# Patient Record
Sex: Male | Born: 1945
Health system: Southern US, Community
[De-identification: ages and names within clinical notes are randomized; demographics above are authoritative.]

## PROBLEM LIST (undated history)

## (undated) DIAGNOSIS — E079 Disorder of thyroid, unspecified: Secondary | ICD-10-CM

## (undated) DIAGNOSIS — I7300 Raynaud's syndrome without gangrene: Secondary | ICD-10-CM

## (undated) DIAGNOSIS — G20 Parkinson's disease: Secondary | ICD-10-CM

## (undated) DIAGNOSIS — F32A Depression, unspecified: Secondary | ICD-10-CM

## (undated) DIAGNOSIS — D649 Anemia, unspecified: Secondary | ICD-10-CM

## (undated) DIAGNOSIS — F329 Major depressive disorder, single episode, unspecified: Secondary | ICD-10-CM

## (undated) DIAGNOSIS — T7840XA Allergy, unspecified, initial encounter: Secondary | ICD-10-CM

## (undated) HISTORY — DX: Major depressive disorder, single episode, unspecified: F32.9

## (undated) HISTORY — PX: ROTATOR CUFF REPAIR: SHX139

## (undated) HISTORY — DX: Depression, unspecified: F32.A

## (undated) HISTORY — PX: TONSILLECTOMY: SUR1361

## (undated) HISTORY — DX: Raynaud's syndrome without gangrene: I73.00

## (undated) HISTORY — PX: HERNIA REPAIR: SHX51

## (undated) HISTORY — PX: VASECTOMY: SHX75

## (undated) HISTORY — DX: Anemia, unspecified: D64.9

## (undated) HISTORY — DX: Disorder of thyroid, unspecified: E07.9

## (undated) HISTORY — PX: APPENDECTOMY: SHX54

## (undated) HISTORY — PX: WRIST FRACTURE SURGERY: SHX121

## (undated) HISTORY — PX: FRACTURE SURGERY: SHX138

## (undated) HISTORY — DX: Allergy, unspecified, initial encounter: T78.40XA

## (undated) HISTORY — PX: MANDIBLE FRACTURE SURGERY: SHX706

## (undated) HISTORY — DX: Parkinson's disease: G20

---

## 1998-08-20 ENCOUNTER — Ambulatory Visit (HOSPITAL_COMMUNITY): Admission: RE | Admit: 1998-08-20 | Discharge: 1998-08-20 | Payer: Self-pay | Admitting: Gastroenterology

## 2000-01-02 ENCOUNTER — Inpatient Hospital Stay (HOSPITAL_COMMUNITY): Admission: EM | Admit: 2000-01-02 | Discharge: 2000-01-04 | Payer: Self-pay | Admitting: Emergency Medicine

## 2000-01-02 ENCOUNTER — Encounter: Payer: Self-pay | Admitting: Emergency Medicine

## 2000-01-03 ENCOUNTER — Encounter: Payer: Self-pay | Admitting: *Deleted

## 2000-10-18 ENCOUNTER — Emergency Department (HOSPITAL_COMMUNITY): Admission: EM | Admit: 2000-10-18 | Discharge: 2000-10-18 | Payer: Self-pay | Admitting: Emergency Medicine

## 2000-10-18 ENCOUNTER — Encounter: Payer: Self-pay | Admitting: Emergency Medicine

## 2001-07-04 ENCOUNTER — Encounter (HOSPITAL_COMMUNITY): Payer: Self-pay | Admitting: Oncology

## 2001-07-04 ENCOUNTER — Ambulatory Visit (HOSPITAL_COMMUNITY): Admission: RE | Admit: 2001-07-04 | Discharge: 2001-07-04 | Payer: Self-pay | Admitting: Specialist

## 2001-09-23 ENCOUNTER — Ambulatory Visit (HOSPITAL_COMMUNITY): Admission: RE | Admit: 2001-09-23 | Discharge: 2001-09-23 | Payer: Self-pay | Admitting: Specialist

## 2001-09-27 ENCOUNTER — Inpatient Hospital Stay (HOSPITAL_COMMUNITY): Admission: RE | Admit: 2001-09-27 | Discharge: 2001-09-28 | Payer: Self-pay | Admitting: Specialist

## 2002-01-09 ENCOUNTER — Ambulatory Visit (HOSPITAL_COMMUNITY): Admission: RE | Admit: 2002-01-09 | Discharge: 2002-01-09 | Payer: Self-pay | Admitting: Specialist

## 2002-04-20 ENCOUNTER — Emergency Department (HOSPITAL_COMMUNITY): Admission: EM | Admit: 2002-04-20 | Discharge: 2002-04-21 | Payer: Self-pay | Admitting: Emergency Medicine

## 2002-04-22 ENCOUNTER — Emergency Department (HOSPITAL_COMMUNITY): Admission: EM | Admit: 2002-04-22 | Discharge: 2002-04-22 | Payer: Self-pay | Admitting: *Deleted

## 2002-04-25 ENCOUNTER — Emergency Department (HOSPITAL_COMMUNITY): Admission: EM | Admit: 2002-04-25 | Discharge: 2002-04-25 | Payer: Self-pay | Admitting: Emergency Medicine

## 2002-04-27 ENCOUNTER — Emergency Department (HOSPITAL_COMMUNITY): Admission: EM | Admit: 2002-04-27 | Discharge: 2002-04-27 | Payer: Self-pay | Admitting: Emergency Medicine

## 2002-04-29 ENCOUNTER — Emergency Department (HOSPITAL_COMMUNITY): Admission: EM | Admit: 2002-04-29 | Discharge: 2002-04-29 | Payer: Self-pay | Admitting: Emergency Medicine

## 2004-01-25 ENCOUNTER — Ambulatory Visit (HOSPITAL_COMMUNITY): Admission: RE | Admit: 2004-01-25 | Discharge: 2004-01-25 | Payer: Self-pay | Admitting: Gastroenterology

## 2004-01-25 ENCOUNTER — Emergency Department (HOSPITAL_COMMUNITY): Admission: EM | Admit: 2004-01-25 | Discharge: 2004-01-25 | Payer: Self-pay | Admitting: Emergency Medicine

## 2006-04-05 ENCOUNTER — Emergency Department (HOSPITAL_COMMUNITY): Admission: EM | Admit: 2006-04-05 | Discharge: 2006-04-05 | Payer: Self-pay | Admitting: Emergency Medicine

## 2007-09-08 ENCOUNTER — Encounter: Admission: RE | Admit: 2007-09-08 | Discharge: 2007-09-08 | Payer: Self-pay | Admitting: Internal Medicine

## 2007-09-30 ENCOUNTER — Encounter: Admission: RE | Admit: 2007-09-30 | Discharge: 2007-12-29 | Payer: Self-pay | Admitting: Psychology

## 2008-06-29 ENCOUNTER — Ambulatory Visit: Payer: Self-pay | Admitting: Psychology

## 2008-07-21 ENCOUNTER — Ambulatory Visit: Payer: Self-pay | Admitting: Psychology

## 2008-08-07 ENCOUNTER — Ambulatory Visit: Payer: Self-pay | Admitting: Psychology

## 2008-08-11 ENCOUNTER — Encounter: Admission: RE | Admit: 2008-08-11 | Discharge: 2008-08-11 | Payer: Self-pay | Admitting: General Surgery

## 2008-08-13 ENCOUNTER — Encounter (INDEPENDENT_AMBULATORY_CARE_PROVIDER_SITE_OTHER): Payer: Self-pay | Admitting: General Surgery

## 2008-08-13 ENCOUNTER — Ambulatory Visit (HOSPITAL_BASED_OUTPATIENT_CLINIC_OR_DEPARTMENT_OTHER): Admission: RE | Admit: 2008-08-13 | Discharge: 2008-08-13 | Payer: Self-pay | Admitting: General Surgery

## 2008-08-21 ENCOUNTER — Ambulatory Visit: Payer: Self-pay | Admitting: Psychology

## 2008-09-16 ENCOUNTER — Ambulatory Visit: Payer: Self-pay | Admitting: Psychology

## 2008-10-02 ENCOUNTER — Ambulatory Visit: Payer: Self-pay | Admitting: Psychology

## 2008-10-16 ENCOUNTER — Ambulatory Visit: Payer: Self-pay | Admitting: Psychology

## 2008-11-04 ENCOUNTER — Ambulatory Visit: Payer: Self-pay | Admitting: Psychology

## 2008-11-19 ENCOUNTER — Ambulatory Visit: Payer: Self-pay | Admitting: Psychology

## 2008-12-18 ENCOUNTER — Ambulatory Visit: Payer: Self-pay | Admitting: Psychology

## 2009-01-18 ENCOUNTER — Ambulatory Visit: Payer: Self-pay | Admitting: Psychology

## 2009-02-15 ENCOUNTER — Ambulatory Visit: Payer: Self-pay | Admitting: Psychology

## 2009-03-25 ENCOUNTER — Ambulatory Visit: Payer: Self-pay | Admitting: Psychology

## 2010-02-03 ENCOUNTER — Ambulatory Visit
Admission: RE | Admit: 2010-02-03 | Discharge: 2010-02-03 | Payer: Self-pay | Source: Home / Self Care | Attending: Orthopedic Surgery | Admitting: Orthopedic Surgery

## 2010-05-02 LAB — POCT HEMOGLOBIN-HEMACUE: Hemoglobin: 13.6 g/dL (ref 13.0–17.0)

## 2010-05-30 LAB — POCT HEMOGLOBIN-HEMACUE: Hemoglobin: 12.5 g/dL — ABNORMAL LOW (ref 13.0–17.0)

## 2010-07-05 NOTE — Op Note (Signed)
[name], [name]               ACCOUNT NO.:  [id number]   MEDICAL RECORD NO.:  [id number]          PATIENT TYPE:  AMB   LOCATION:  DSC                          FACILITY:  [facility]   PHYSICIAN:  Juanetta Gosling, MDDATE OF BIRTH:  1945/08/14   DATE OF PROCEDURE:  08/13/2008  DATE OF DISCHARGE:                               OPERATIVE REPORT   PREOPERATIVE DIAGNOSIS:  Right inguinal hernia.   POSTOPERATIVE DIAGNOSIS:  [place] right inguinal hernia.   PROCEDURE:  1. Right inguinal repair with extended Prolene hernia system mesh.  2. Right groin lymph node biopsy.   SURGEON:  Juanetta Gosling, MD   ASSISTANT:  None.   ANESTHESIA:  General.   SPECIMENS:  1. Cord lipoma.  2. Lymph node.   DISPOSITION:  To pathology.   ESTIMATED BLOOD LOSS:  Minimal.   COMPLICATIONS:  None.   DRAINS:  None.   DISPOSITION:  The patient to PACU in stable condition.   INDICATIONS:  [name] is a 65 year old male with a couple of year  history of increase in size right groin mass, become increasingly tender  over that time.  He was also recently diagnosed with Parkinson disease  for which he is being treated.  He does have a prior history of  colonoscopy by Dr. Randa Evens and now followed by Dr. Bernette Redbird.  On  his exam, he had a reducible right groin hernia.  He and I discussed  open right groin hernia repair.   PROCEDURE:  After informed consent was obtained, the patient was taken  to the operating room.  He was administered 400 mg of IV ciprofloxacin.  Sequential compression devices were placed on the lower extremities  prior to operation.  He then underwent general anesthesia with an LMA  without complication.  His right groin was then prepped and draped in a  standard sterile surgical fashion.  A surgical time-out was then  performed.   A 4-cm right groin incision was then made.  Dissection was carried out  down to the level of the fascia.  The superficial epigastric vein  was  ligated with 3-0 Vicryl suture.  The external oblique was entered  through the external ring.  He was noted to have an indirect hernia as  well as a cord lipoma and a  direct hernia.  The cord lipoma was  excised.  The indirect hernia was freed from the surrounding cord  structures which were preserved.  This was then entered with scissors.  The sac was then probed.  I then ligated the sac with a 2-0 silk suture  and then dunked this back into the abdomen and this was passed off the  table as a specimen.  Following this, I then approached the direct  hernia.  He had about a 5-mm defect with a fair amount of preperitoneal  fat incarcerated in this.  This was reduced and I repaired this  primarily with a 2-0 Vicryl suture to close the floor.  Following this,  I then developed my preperitoneal space with a Ray-Tec sponge.  I then  inserted an extended  Prolene hernia system mesh into the preperitoneal  space and laid this flat.  The top portion of the bilayer was then  brought out.  A T-cut was made around the mesh and wrapped around the  spermatic cord.  This was sutured in 2 positions at the pubic tubercle,  2 positions inferiorly at the inguinal ligament, and 1 position  superiorly at the internal oblique with 2-0 Prolene sutures.  The ends  were tacked together.  The ends of the T-cut were then tacked together  around the spermatic cord as well as sutured to the inguinal ligament  with 2-0 Prolene.  The lateral portion was laid under the external  abdominal oblique.  Hemostasis was observed.  The mesh appeared to lie  in good position.  The Penrose drain was removed from around the  spermatic cord.  The external oblique was closed with a 2-0 Vicryl.  The  Scarpa's was closed with 3-0 Vicryl.  The skin was closed with 4-0  Monocryl in a subcuticular fashion.  Dermabond was placed over the  wound.  A 10 mL of 1% lidocaine mixed with 0.2% Marcaine were then  infiltrated the wound as  well as performed an ilioinguinal nerve block.  Right testicle was in the scrotum at the completion of the operation.  He tolerated this well and was transferred to the recovery room in  stable condition.      Juanetta Gosling, MD  Electronically Signed     MCW/MEDQ  D:  08/13/2008  T:  08/13/2008  Job:  [id number]   cc:   Jonita Albee, M.D.

## 2010-07-08 NOTE — H&P (Signed)
NAMESAYED, APOSTOL                         ACCOUNT NO.:  [id number]   MEDICAL RECORD NO.:  [id number]                   PATIENT TYPE:  AMB   LOCATION:  DAY                                  FACILITY:  [facility]   PHYSICIAN:  Philips J. Montez Morita, M.D.             DATE OF BIRTH:  [birth year]   DATE OF ADMISSION:  09/23/2001  DATE OF DISCHARGE:                                HISTORY & PHYSICAL   CHIEF COMPLAINT:  Right shoulder pain.   HISTORY OF PRESENT ILLNESS:  The patient is a pleasant 65 year old male,  which in February of 2003 feel on the ice, landed on his shoulder. Since  that time, he has had progressive right shoulder pain that is interfering  with his activities of daily living as well as his job. He had an MRI, which  revealed a rotator cuff tear. It was felt he would benefit from undergoing a  right rotator cuff repair and the risks and benefits of the procedure  discussed with the patient and he elected to proceed.   ALLERGIES:  PENICILLIN causes rash.   MEDICATIONS:  Tylenol p.r.n.   PAST MEDICAL HISTORY:  Fracture of a mandible. He reports that in the  distant past he had a history of anemia. He states that he did undergo a  thorough work-up and the cause never really could be found. No problems that  he reports recently.   SOCIAL HISTORY:  Right-hand dominant. Denies any alcohol or tobacco use. He  is married. He works in Production designer, theatre/television/film at [employer]. He has two  children. He lives in a one-level home. His wife will be available for help  with him after surgery.   PAST SURGICAL HISTORY:  OIF mandible, chin implant, tonsillectomy.   FAMILY HISTORY:  Father deceased. age 7, history of colon cancer. Mother  living, age 35, history of cervical cancer.   REVIEW OF SYMPTOMS:  GENERAL: He had a recent upper respiratory infection  about two weeks ago, reports he has significantly improved since that time.  PULMONARY: No shortness of breath, productive cough or  hemoptysis.  CARDIOVASCULAR: No chest pain, angina, orthopnea. GENITOURINARY: No  hematuria, dysuria, or discharge. GASTROINTESTINAL: No nausea, vomiting,  diarrhea, or constipation. PSYCHIATRIC: No history of anxiety or depression.  MUSCULOSKELETAL: No history of any other joint pain.   PHYSICAL EXAMINATION:   GENERAL:  Alert and oriented, well-developed, well-nourished 65 year old  male.   VITAL:  SIGNS: Pulse 60, respirations 12, blood pressure 120/90.   HEENT:  Head is atraumatic, normocephalic. Oropharynx is clear.   NECK:  Supple, negative for cervical lymphadenopathy.   CHEST:  Clear to auscultation bilaterally. No wheezes, rhonchi or rubs.   BREAST:  Not pertinent to present illness.   HEART:  S1, S2, negative for murmur or gallop. Heart regular rate and  rhythm.   ABDOMEN:  Soft, nontender, positive bowel sounds.   GENITOURINARY:  Not pertinent to  present illness.   EXTREMITIES:  He has pain with abduction, decreased range of motion, but  limited in adduction.   SKIN:  Intact. No rashes or lesions appreciated on examination.   LABORATORY AND ACCESSORY DATA:  Labs and x-rays are pending.   IMPRESSION:  Right rotator cuff tear.   PLAN:  Right rotator cuff repair by Dr. Peter Minium on September 23, 2001.      Bradd Canary Clabe Seal.                       Philips J. Montez Morita, M.D.    JBB/MEDQ  D:  09/17/2001  T:  09/18/2001  Job:  [id number]

## 2010-07-14 ENCOUNTER — Other Ambulatory Visit: Payer: Self-pay | Admitting: Dermatology

## 2010-08-10 ENCOUNTER — Other Ambulatory Visit: Payer: Self-pay | Admitting: Dermatology

## 2011-02-06 ENCOUNTER — Ambulatory Visit (INDEPENDENT_AMBULATORY_CARE_PROVIDER_SITE_OTHER): Payer: Medicare Other | Admitting: Internal Medicine

## 2011-02-06 DIAGNOSIS — G20 Parkinson's disease: Secondary | ICD-10-CM

## 2011-02-06 DIAGNOSIS — M25569 Pain in unspecified knee: Secondary | ICD-10-CM

## 2011-02-06 DIAGNOSIS — Z23 Encounter for immunization: Secondary | ICD-10-CM

## 2011-02-06 DIAGNOSIS — F329 Major depressive disorder, single episode, unspecified: Secondary | ICD-10-CM

## 2011-02-06 DIAGNOSIS — E039 Hypothyroidism, unspecified: Secondary | ICD-10-CM

## 2011-03-14 ENCOUNTER — Ambulatory Visit (INDEPENDENT_AMBULATORY_CARE_PROVIDER_SITE_OTHER): Payer: Medicare Other

## 2011-03-14 DIAGNOSIS — F341 Dysthymic disorder: Secondary | ICD-10-CM | POA: Diagnosis not present

## 2011-03-14 DIAGNOSIS — G4733 Obstructive sleep apnea (adult) (pediatric): Secondary | ICD-10-CM

## 2011-03-14 DIAGNOSIS — G4700 Insomnia, unspecified: Secondary | ICD-10-CM | POA: Diagnosis not present

## 2011-03-21 DIAGNOSIS — E291 Testicular hypofunction: Secondary | ICD-10-CM | POA: Diagnosis not present

## 2011-03-21 DIAGNOSIS — N529 Male erectile dysfunction, unspecified: Secondary | ICD-10-CM | POA: Diagnosis not present

## 2011-03-23 ENCOUNTER — Telehealth: Payer: Self-pay

## 2011-03-23 DIAGNOSIS — D239 Other benign neoplasm of skin, unspecified: Secondary | ICD-10-CM | POA: Diagnosis not present

## 2011-03-23 DIAGNOSIS — F329 Major depressive disorder, single episode, unspecified: Secondary | ICD-10-CM

## 2011-03-23 NOTE — Telephone Encounter (Signed)
Patient E-mailed and would like a call back.  He directed his message to Dr. Patsy Lager. He went to Dr. Vernie Ammons on 03/21/11 and was told that his testosterone was normal.  He wants to know what the next step should be.  He would like a call back.

## 2011-03-23 NOTE — Telephone Encounter (Signed)
Chart is in dr. box in back

## 2011-03-24 NOTE — Telephone Encounter (Signed)
Called and LMOM.  We need to discuss a medication change- will probably want to D/C his azilect (which is an MAO-I) so we can start an SSRI.  I am out of office tomorrow but will call on Sunday JC

## 2011-03-27 NOTE — Telephone Encounter (Signed)
Spoke again with patient yesterday.  He has seen urology and they do not think that T supplementation will be helpful.  He would like to start and antidepressant of some sort- this is made more complicated by his Azilect.  He would like to continue this medication if possible because when he has stopped it in the past his Parkinson's symptoms got worse.  I will go ahead and refer him to psychiatry.

## 2011-04-02 ENCOUNTER — Ambulatory Visit (INDEPENDENT_AMBULATORY_CARE_PROVIDER_SITE_OTHER): Payer: Medicare Other | Admitting: Emergency Medicine

## 2011-04-02 VITALS — BP 145/75 | HR 66 | Temp 97.9°F | Resp 16 | Ht 67.5 in | Wt 135.0 lb

## 2011-04-02 DIAGNOSIS — F329 Major depressive disorder, single episode, unspecified: Secondary | ICD-10-CM

## 2011-04-02 DIAGNOSIS — F3341 Major depressive disorder, recurrent, in partial remission: Secondary | ICD-10-CM | POA: Insufficient documentation

## 2011-04-02 DIAGNOSIS — G20 Parkinson's disease: Secondary | ICD-10-CM

## 2011-04-02 DIAGNOSIS — R05 Cough: Secondary | ICD-10-CM | POA: Diagnosis not present

## 2011-04-02 DIAGNOSIS — J329 Chronic sinusitis, unspecified: Secondary | ICD-10-CM | POA: Diagnosis not present

## 2011-04-02 MED ORDER — HYDROCOD POLST-CHLORPHEN POLST 10-8 MG/5ML PO LQCR: 5.0000 mL | Freq: Two times a day (BID) | ORAL | Status: DC | PRN

## 2011-04-02 MED ORDER — AZITHROMYCIN 250 MG PO TABS: ORAL_TABLET | ORAL | Status: AC

## 2011-04-02 MED ORDER — OLOPATADINE HCL 0.6 % NA SOLN: 2.0000 | Freq: Two times a day (BID) | NASAL | Status: DC

## 2011-04-02 NOTE — Patient Instructions (Addendum)
Patient was given the name and number of Dr. Betti Cruz we can go ahead make his own appointment for psychiatric care. He has been significantly depressed recently and needs some assistance with this. Advised him he needs to make his own appointment as this is the policy of the psychiatrist.          Sinusitis Sinuses are air pockets within the bones of your face. The growth of bacteria within a sinus leads to infection. The infection prevents the sinuses from draining. This infection is called sinusitis. SYMPTOMS   There will be different areas of pain depending on which sinuses have become infected.  The maxillary sinuses often produce pain beneath the eyes.     Frontal sinusitis may cause pain in the middle of the forehead and above the eyes.  Other problems (symptoms) include:  Toothaches.     Colored, pus-like (purulent) drainage from the nose.     Swelling, warmth, and tenderness over the sinus areas may be signs of infection.  TREATMENT   Sinusitis is most often determined by an exam.X-rays may be taken. If x-rays have been taken, make sure you obtain your results or find out how you are to obtain them. Your caregiver may give you medications (antibiotics). These are medications that will help kill the bacteria causing the infection. You may also be given a medication (decongestant) that helps to reduce sinus swelling.   HOME CARE INSTRUCTIONS    Only take over-the-counter or prescription medicines for pain, discomfort, or fever as directed by your caregiver.     Drink extra fluids. Fluids help thin the mucus so your sinuses can drain more easily.     Applying either moist heat or ice packs to the sinus areas may help relieve discomfort.     Use saline nasal sprays to help moisten your sinuses. The sprays can be found at your local drugstore.  SEEK IMMEDIATE MEDICAL CARE IF:  You have a fever.     You have increasing pain, severe headaches, or toothache.     You have  nausea, vomiting, or drowsiness.     You develop unusual swelling around the face or trouble seeing.  MAKE SURE YOU:    Understand these instructions.     Will watch your condition.     Will get help right away if you are not doing well or get worse.  Document Released: 02/06/2005 Document Revised: 10/19/2010 Document Reviewed: 09/05/2006 [facility] Patient Information 2012 [place], [place].

## 2011-04-02 NOTE — Progress Notes (Signed)
  Subjective:    Patient ID: [name], male    DOB: 23-Nov-1945, 66 y.o.   MRN: [id number]  HPI patient presents with onset Thursday of upper respiratory type congestion. His developed significant bilateral maxillary sinus pain. Does have frequent episodes of sneezing with a productive cough. He is concerned about the infection heading to his chest.    Review of Systems patient has severe parkinsonism. He is on multiple medications for this problem.     Objective:   Physical Exam physical exam reveals typical bases are parkinsonism. His TMs are clear his nose is very congested with a purulent nasal drainage. His neck is supple without adenopathy his chest is clear to auscultation and percussion.        Assessment & Plan:  Assessment is upper respiratory infection with developing sinusitis and cough.

## 2011-06-12 DIAGNOSIS — F329 Major depressive disorder, single episode, unspecified: Secondary | ICD-10-CM | POA: Diagnosis not present

## 2011-06-12 DIAGNOSIS — G20 Parkinson's disease: Secondary | ICD-10-CM | POA: Diagnosis not present

## 2011-09-11 ENCOUNTER — Ambulatory Visit (INDEPENDENT_AMBULATORY_CARE_PROVIDER_SITE_OTHER): Payer: Medicare Other | Admitting: Internal Medicine

## 2011-09-11 ENCOUNTER — Encounter: Payer: Self-pay | Admitting: Internal Medicine

## 2011-09-11 VITALS — BP 119/77 | HR 60 | Temp 97.5°F | Resp 16 | Ht 67.5 in | Wt 127.6 lb

## 2011-09-11 DIAGNOSIS — Z79899 Other long term (current) drug therapy: Secondary | ICD-10-CM | POA: Diagnosis not present

## 2011-09-11 DIAGNOSIS — Z0000 Encounter for general adult medical examination without abnormal findings: Secondary | ICD-10-CM

## 2011-09-11 DIAGNOSIS — E039 Hypothyroidism, unspecified: Secondary | ICD-10-CM

## 2011-09-11 DIAGNOSIS — G20 Parkinson's disease: Secondary | ICD-10-CM | POA: Diagnosis not present

## 2011-09-11 DIAGNOSIS — Z125 Encounter for screening for malignant neoplasm of prostate: Secondary | ICD-10-CM

## 2011-09-11 DIAGNOSIS — Z13220 Encounter for screening for lipoid disorders: Secondary | ICD-10-CM

## 2011-09-11 LAB — POCT URINALYSIS DIPSTICK
Bilirubin, UA: NEGATIVE
Blood, UA: NEGATIVE
Glucose, UA: NEGATIVE
Leukocytes, UA: NEGATIVE
Nitrite, UA: NEGATIVE
Protein, UA: NEGATIVE
Spec Grav, UA: 1.02

## 2011-09-11 LAB — COMPREHENSIVE METABOLIC PANEL
Alkaline Phosphatase: 80 U/L (ref 39–117)
Chloride: 105 mEq/L (ref 96–112)
Creat: 1.05 mg/dL (ref 0.50–1.35)
Glucose, Bld: 80 mg/dL (ref 70–99)
Potassium: 4.4 mEq/L (ref 3.5–5.3)
Total Bilirubin: 0.5 mg/dL (ref 0.3–1.2)
Total Protein: 6.6 g/dL (ref 6.0–8.3)

## 2011-09-11 LAB — CBC WITH DIFFERENTIAL/PLATELET
Eosinophils Relative: 2 % (ref 0–5)
HCT: 38.4 % — ABNORMAL LOW (ref 39.0–52.0)
Lymphocytes Relative: 30 % (ref 12–46)
Lymphs Abs: 1.4 10*3/uL (ref 0.7–4.0)
MCHC: 35.2 g/dL (ref 30.0–36.0)
MCV: 87.1 fL (ref 78.0–100.0)
Monocytes Absolute: 0.4 10*3/uL (ref 0.1–1.0)
Neutrophils Relative %: 58 % (ref 43–77)
RBC: 4.41 MIL/uL (ref 4.22–5.81)
RDW: 13.9 % (ref 11.5–15.5)

## 2011-09-11 LAB — LIPID PANEL
HDL: 54 mg/dL (ref >39)
VLDL: 19 mg/dL (ref 0–40)

## 2011-09-11 LAB — POCT UA - MICROSCOPIC ONLY
Bacteria, U Microscopic: NEGATIVE
Casts, Ur, LPF, POC: NEGATIVE
Crystals, Ur, HPF, POC: NEGATIVE

## 2011-09-11 LAB — TSH: TSH: 1.366 u[IU]/mL (ref 0.350–4.500)

## 2011-09-11 NOTE — Patient Instructions (Addendum)
Parkinson's Disease Parkinson's disease causes a slow decline of some of the nerve centers in the brain. The problems (symptoms) of the disease happen when the ratios of two signal transmitters in the brain (dopamine and acetylcholine) are not in balance. Medications can be given to restore the relationship of dopamine and acetylcholine. CAUSES  Parkinson's disease is caused by depletion of the brain nerve transmitter dopamine. Infection, poisoning, and certain medications can be causes. But the cause is often not known. SYMPTOMS  This disease usually starts in middle or late life. It develops very slowly. An early symptom of Parkinson's is often an uncontrolled pill-rolling tremor of the hands. The thumb and index finger rub together. The tremor often will disappear when the affected hand is consciously used. Walking, talking, getting out of a chair, and new movements become more difficult as this disease progresses. Later, memory and thought processes may deteriorate.  DIAGNOSIS  Other conditions can resemble this disease. Special tests may be needed to evaluate your problem completely and establish the diagnosis. These tests include MRI or CT scans. TREATMENT  No treatment is usually needed early in the disease. But it tends to get progressively worse. No medicines stop the progression. There are medications for treatment as the disease progresses. These can have side effects. The medications are not usually prescribed until the symptoms are troublesome. Levodopa and carbidopa (Sinemet, Dopar, Larodopa) are prescribed to increase the amount of dopamine in the brain. Other medicines used to treat this disease include amantadine, bromocriptine, and Eldepryl. The treatment relieves symptoms. It can make movement and balance better and help control the tremor. But it does not slow down the progression of the disease. Sometimes surgical treatment of the brain can be done in young people. Other treatments  include transplantation in the brain of tissues that can make dopamine. Transplantation of fetal tissue has also been done with variable results. Regular exercise and rest periods during the day help prevent exhaustion and depression. Keeping a positive mental attitude helps a great deal, too.  There is still no medication or treatment that stops the progression of this disease. Earlier treatment will not shorten the length of the illness, but treatment allows patients to continue with daily activities for many years. FOR MORE INFORMATION  Call your caregiver.   Call the National Parkinson's Foundation at 1-800-433-7022. Web site: www.parkinson.org/  Document Released: 02/04/2000 Document Revised: 01/26/2011 Document Reviewed: 02/06/2005 [employer] Patient Information 2012 [employer]. 

## 2011-09-11 NOTE — Progress Notes (Signed)
  Subjective:    Patient ID: [name], male    DOB: 1946/02/10, 66 y.o.   MRN: [id number]  HPI See scanned hx.No changes, feels overall better. Exercises everyday. Parkinsons appears controlled better, gait stable. Depression appears resoved.   Review of Systems    see scanned ros Objective:   Physical Exam  Constitutional: He is oriented to person, place, and time. He appears well-developed and well-nourished.  HENT:  Right Ear: External ear normal.  Left Ear: External ear normal.  Nose: Nose normal.  Mouth/Throat: Oropharynx is clear and moist.  Eyes: EOM are normal. Pupils are equal, round, and reactive to light.  Neck: Normal range of motion. Neck supple. No thyromegaly present.  Cardiovascular: Normal rate, regular rhythm and normal heart sounds.   Pulmonary/Chest: Effort normal and breath sounds normal.  Abdominal: Soft. Bowel sounds are normal. He exhibits no mass.  Genitourinary: Rectum normal, prostate normal and penis normal.  Musculoskeletal: He exhibits no edema and no tenderness.       Cogwheel rigidity improved, left greater than right disease.             Neurological: He is alert and oriented to person, place, and time. He has normal reflexes. No cranial nerve deficit. He exhibits normal muscle tone. Coordination abnormal.  Skin: Skin is warm and dry.  Psychiatric: He has a normal mood and affect. His behavior is normal.  EKG nl        Assessment & Plan:  Stable healthy exam for him RF meds 1 year

## 2011-10-06 DIAGNOSIS — G20 Parkinson's disease: Secondary | ICD-10-CM | POA: Diagnosis not present

## 2011-10-30 ENCOUNTER — Other Ambulatory Visit: Payer: Self-pay | Admitting: Internal Medicine

## 2011-11-15 ENCOUNTER — Ambulatory Visit (INDEPENDENT_AMBULATORY_CARE_PROVIDER_SITE_OTHER): Payer: Medicare Other | Admitting: Family Medicine

## 2011-11-15 VITALS — BP 122/78 | HR 67 | Temp 97.9°F | Resp 16 | Ht 66.0 in | Wt 133.0 lb

## 2011-11-15 DIAGNOSIS — G20 Parkinson's disease: Secondary | ICD-10-CM

## 2011-11-15 DIAGNOSIS — G2581 Restless legs syndrome: Secondary | ICD-10-CM | POA: Diagnosis not present

## 2011-11-15 DIAGNOSIS — G20C Parkinsonism, unspecified: Secondary | ICD-10-CM

## 2011-11-15 LAB — POTASSIUM: Potassium: 4.4 mEq/L (ref 3.5–5.3)

## 2011-11-15 MED ORDER — CLONAZEPAM 0.5 MG PO TABS: 0.5000 mg | ORAL_TABLET | Freq: Every evening | ORAL | Status: DC | PRN

## 2011-11-15 NOTE — Patient Instructions (Signed)
Start an additional Requip 2mg  before bed as this should help with the restless legs.  If this doesn't, try supplementing with 400u of magnesium otc before bed.  If you are still having symptoms, try the clonazepam before bed.  Restless Legs Syndrome Restless legs syndrome is a movement disorder. It may also be called a sensori-motor disorder.  CAUSES  No one knows what specifically causes restless legs syndrome, but it tends to run in families. It is also more common in people with low iron, in pregnancy, in people who need dialysis, and those with nerve damage (neuropathy).Some medications may make restless legs syndrome worse.Those medications include drugs to treat high blood pressure, some heart conditions, nausea, colds, allergies, and depression. SYMPTOMS Symptoms include uncomfortable sensations in the legs. These leg sensations are worse during periods of inactivity or rest. They are also worse while sitting or lying down. Individuals that have the disorder describe sensations in the legs that feel like:  Pulling.   Drawing.   Crawling.   Worming.   Boring.   Tingling.   Pins and needles.   Prickling.   Pain.  The sensations are usually accompanied by an overwhelming urge to move the legs. Sudden muscle jerks may also occur. Movement provides temporary relief from the discomfort. In rare cases, the arms may also be affected. Symptoms may interfere with going to sleep (sleep onset insomnia). Restless legs syndrome may also be related to periodic limb movement disorder (PLMD). PLMD is another more common motor disorder. It also causes interrupted sleep. The symptoms from PLMD usually occur most often when you are awake. TREATMENT  Treatment for restless legs syndrome is symptomatic. This means that the symptoms are treated.   Massage and cold compresses may provide temporary relief.   Walk, stretch, or take a cold or hot bath.   Get regular exercise and a good night's  sleep.   Avoid caffeine, alcohol, nicotine, and medications that can make it worse.   Do activities that provide mental stimulation like discussions, needlework, and video games. These may be helpful if you are not able to walk or stretch.  Some medications are effective in relieving the symptoms. However, many of these medications have side effects. Ask your caregiver about medications that may help your symptoms. Correcting iron deficiency may improve symptoms for some patients. Document Released: 01/27/2002 Document Revised: 01/26/2011 Document Reviewed: 05/05/2010 [facility] Patient Information 2012 [place], [place].

## 2011-11-15 NOTE — Progress Notes (Signed)
  Subjective:    Patient ID: [name], male    DOB: 1945/08/15, 66 y.o.   MRN: [id number]  HPI  For years, pt has had legs burning and cramping whenever he relaxes at night and tries to watch tv. Sometimes it happens in the morning when he is watching tv as well. During the day when he is sitting or eating, it usually doesn't bother him.  He has had this for to long to remember and seen multiple providers about it including his neurologist. He is on sinemet tid and requip 1/2 tab qid for parkinsons. These have not seemed to help his sxs at all.  Past Medical History  Diagnosis Date  . Thyroid disease   . Allergy   . Depression   . Parkinson's disease     Review of Systems  Musculoskeletal: Positive for myalgias. Negative for arthralgias and gait problem.  Skin: Negative for rash.  Neurological: Positive for tremors and weakness. Negative for dizziness, seizures, light-headedness and numbness.  Hematological: Negative for adenopathy. Does not bruise/bleed easily.  Psychiatric/Behavioral: Positive for sleep disturbance and agitation.      BP 122/78  Pulse 67  Temp 97.9 F (36.6 C) (Oral)  Resp 16  Ht 5\' 6"  (1.676 m)  Wt 133 lb (60.328 kg)  BMI 21.47 kg/m2  SpO2 97% Objective:   Physical Exam  Constitutional: He is oriented to person, place, and time. He appears well-developed and well-nourished. No distress.  HENT:  Head: Normocephalic and atraumatic.  Eyes: Conjunctivae normal are normal. Pupils are equal, round, and reactive to light. No scleral icterus.  Neck: Normal range of motion. Neck supple. No thyromegaly present.  Cardiovascular: Normal rate, regular rhythm, normal heart sounds and intact distal pulses.   Pulmonary/Chest: Effort normal and breath sounds normal. No respiratory distress.  Musculoskeletal: He exhibits no edema.  Lymphadenopathy:    He has no cervical adenopathy.  Neurological: He is alert and oriented to person, place, and time. He has normal  reflexes. He displays no atrophy. He exhibits normal muscle tone. Coordination and gait normal.  Skin: Skin is warm and dry. He is not diaphoretic.  Psychiatric: He has a normal mood and affect. His behavior is normal.          Assessment & Plan:  1. Restless leg syndrome - check K and Mg to r/o electrolyte abnml exacerbating.  First try taking a whole 2mg  tab of requip at night before bed. If this doesn't help, try supplementing with otc magnesium 400u.  If he is still having symptoms, start low dose klonopin at night. If symptoms continue, sometimes opiate or higher dose benzos may be needed.

## 2011-11-16 LAB — MAGNESIUM: Magnesium: 2 mg/dL (ref 1.5–2.5)

## 2011-11-20 ENCOUNTER — Ambulatory Visit (INDEPENDENT_AMBULATORY_CARE_PROVIDER_SITE_OTHER): Payer: Medicare Other | Admitting: Family Medicine

## 2011-11-20 VITALS — BP 110/65 | HR 69 | Temp 98.2°F | Resp 16 | Ht 69.0 in | Wt 133.0 lb

## 2011-11-20 DIAGNOSIS — R05 Cough: Secondary | ICD-10-CM

## 2011-11-20 DIAGNOSIS — R059 Cough, unspecified: Secondary | ICD-10-CM

## 2011-11-20 DIAGNOSIS — IMO0002 Reserved for concepts with insufficient information to code with codable children: Secondary | ICD-10-CM

## 2011-11-20 DIAGNOSIS — Z23 Encounter for immunization: Secondary | ICD-10-CM | POA: Diagnosis not present

## 2011-11-20 MED ORDER — IPRATROPIUM BROMIDE 0.03 % NA SOLN: 2.0000 | Freq: Two times a day (BID) | NASAL | Status: DC

## 2011-11-20 MED ORDER — GUAIFENESIN ER 1200 MG PO TB12: 1.0000 | ORAL_TABLET | Freq: Two times a day (BID) | ORAL | Status: DC | PRN

## 2011-11-20 MED ORDER — HYDROCOD POLST-CHLORPHEN POLST 10-8 MG/5ML PO LQCR: 5.0000 mL | Freq: Two times a day (BID) | ORAL | Status: DC | PRN

## 2011-11-20 MED ORDER — DOXYCYCLINE HYCLATE 100 MG PO CAPS: 100.0000 mg | ORAL_CAPSULE | Freq: Two times a day (BID) | ORAL | Status: DC

## 2011-11-20 NOTE — Patient Instructions (Signed)
Get plenty of rest and drink at least 64 ounces of water daily. If your toe pain persists after 48 hours on the antibiotic, or if it's not completely resolved when you finish the antibiotics, return for re-evaluation-we'll likely need to remove part of the nail.

## 2011-11-20 NOTE — Progress Notes (Signed)
Subjective:    Patient ID: [name], male    DOB: 04-May-1945, 66 y.o.   MRN: [id number]  HPI This 66 y.o. male presents for evaluation of several concerns. 1. Left great toe pain x 2 days.  He thinks it may be ingrown.  No drainage, bleeding, trauma.  History of onychomycosis, s/p terbinafine treatment which cleared all the toenails except the great toenail. 2. Post-nasal drainage and cough.  Symptoms x 2 days.  Scratchy throat.  No fever, chills, GI/GU symptoms.  Additionally, he'd like a flu vaccine.  Review of Systems As above.   Past Medical History  Diagnosis Date  . Neuromuscular disorder-Parkinson's   . Thyroid disease     Past Surgical History  Procedure Date  . Mandible fracture surgery   . Hernia repair   . Wrist fracture surgery   . Rotator cuff repair   . Tonsillectomy     Prior to Admission medications   Medication Sig Start Date End Date Taking? Authorizing Provider  aspirin 81 MG tablet Take 160 mg by mouth daily.   Yes Historical Provider, MD  carbidopa-levodopa (SINEMET) 25-100 MG per tablet Take 1 tablet by mouth 3 (three) times daily.   Yes Historical Provider, MD  cholecalciferol (VITAMIN D) 1000 UNITS tablet Take 1,000 Units by mouth 2 (two) times daily.   Yes Historical Provider, MD  clonazePAM (KLONOPIN) 0.5 MG tablet Take 1 tablet (0.5 mg total) by mouth at bedtime as needed (restless legs). 11/15/11  Yes Norberto Sorenson, MD  fesoterodine (TOVIAZ) 4 MG TB24 Take 4 mg by mouth daily.   Yes Historical Provider, MD  iron polysaccharides (NIFEREX 60) 40-20 MG capsule Take 1 capsule by mouth daily.    Yes Historical Provider, MD  levothyroxine (SYNTHROID, LEVOTHROID) 75 MCG tablet TAKE ONE TABLET BY MOUTH DAILY 10/30/11  Yes Ryan M Dunn, PA-C  Multiple Vitamins-Minerals (MULTIVITAMIN WITH MINERALS) tablet Take 1 tablet by mouth daily.   Yes Historical Provider, MD  Rasagiline Mesylate (AZILECT) 1 MG TABS Take by mouth.   Yes Historical Provider, MD  rOPINIRole  (REQUIP) 2 MG tablet Take 2 mg by mouth at bedtime. 1/2 tablet 4 times a day   Yes Historical Provider, MD  venlafaxine XR (EFFEXOR-XR) 37.5 MG 24 hr capsule  08/19/11  Yes Historical Provider, MD  vitamin B-12 (CYANOCOBALAMIN) 1000 MCG tablet Take 1,000 mcg by mouth daily.   Yes Historical Provider, MD    Allergies  Allergen Reactions  . Lamictal (Lamotrigine) Other (See Comments)    Blisters in mouth  . Nsaids   . Penicillins     History   Social History  . Marital Status: Married    Spouse Name: [name]    Number of Children: 2  . Years of Education: 12+   Occupational History  . DISABILITY     due to Parkinson's   Social History Main Topics  . Smoking status: Never Smoker   . Smokeless tobacco: Never Used  . Alcohol Use: 0.0 - 4.2 oz/week    0-7 Cans of beer per week  . Drug Use: No  . Sexually Active: No   Other Topics Concern  . Not on file   Social History Narrative  . No narrative on file    Family History  Problem Relation Age of Onset  . Cancer Father     colon       Objective:   Physical Exam  Blood pressure 110/65, pulse 69, temperature 98.2 F (36.8 C), resp. rate  16, height 5\' 9"  (1.753 m), weight 133 lb (60.328 kg). Body mass index is 19.64 kg/(m^2). Well-developed, well nourished WM who is awake, alert and oriented, in NAD. HEENT: [place]/AT, PERRL, EOMI.  Sclera and conjunctiva are clear.  EAC are patent, TMs are normal in appearance. Nasal mucosa is pink and moist. OP is clear. Neck: supple, non-tender, no lymphadenopathy, thyromegaly. Heart: RRR, no murmur Lungs: normal effort, CTA Extremities: no cyanosis, clubbing or edema. Tenderness along the medial nail fold of the left great toe.  Minimal erythema.  No drainage.  Skin: warm and dry without rash. The nail of the left great toe is thick and brittle.  It is quite short, and may be ingrown. Psychologic: good mood and appropriate affect, normal speech and behavior.       Assessment & Plan:     1. Paronychia  doxycycline (VIBRAMYCIN) 100 MG capsule; hope to avoid wedge resection of the toenail, given it's hypertrophy and brittleness.  However, if his symptoms persist or recur, RTC for procedure.  2. Cough  Guaifenesin (MUCINEX MAXIMUM STRENGTH) 1200 MG TB12, chlorpheniramine-HYDROcodone (TUSSIONEX PENNKINETIC ER) 10-8 MG/5ML LQCR, ipratropium (ATROVENT) 0.03 % nasal spray  3. Need for influenza vaccination  Flu vaccine greater than or equal to 3yo preservative free IM   Seen with Dr. Alwyn Ren.

## 2011-11-21 NOTE — Progress Notes (Signed)
  Subjective:    Patient ID: [name], male    DOB: 10/19/1945, 66 y.o.   MRN: [id number]  HPI65 year man with great toe pain and sinus congestion    Review of Systems     Objective:   Physical Exam Alert, oriented.  HEENT: no acute erythema or congestion.  Chest CTA. Heart RRR. Toenails have some dystrophic changes.  Left large is tender , mod erythema surrounding.        Assessment & Plan:  Paronychia Cough  PA will further assess and treat. Doxy.

## 2011-12-19 ENCOUNTER — Other Ambulatory Visit: Payer: Self-pay

## 2011-12-19 MED ORDER — ROPINIROLE HCL 2 MG PO TABS: ORAL_TABLET | ORAL | Status: DC

## 2011-12-19 NOTE — Telephone Encounter (Signed)
PATIENT CALLED ASKING FOR A REFILL FOR rOPINIRole (REQUIP) 2 MG tablet PATIENT SAW DR. SHAW IN September. PLEASE CALL BACK AT [phone] FOR ANY QUESTIONS. THANK YOU!

## 2011-12-19 NOTE — Telephone Encounter (Signed)
Assessment & Plan:   1. Restless leg syndrome - check K and Mg to r/o electrolyte abnml exacerbating. First try taking a whole 2mg  tab of requip at night before bed. If this doesn't help, try supplementing with otc magnesium 400u. If he is still having symptoms, start low dose klonopin at night. If symptoms continue, sometimes opiate or higher dose benzos may be needed.  Patient wants renewal on requip, have pended Rx, but please double check the sig, looks like may have been taking 1/2 tablet 4 times a day, but this states 2mg  at bedtime. Thanks Janari Gagner

## 2012-01-05 ENCOUNTER — Ambulatory Visit (INDEPENDENT_AMBULATORY_CARE_PROVIDER_SITE_OTHER): Payer: Medicare Other | Admitting: Family Medicine

## 2012-01-05 ENCOUNTER — Ambulatory Visit: Payer: Medicare Other

## 2012-01-05 VITALS — BP 130/86 | HR 69 | Temp 98.4°F | Resp 16 | Ht 67.0 in | Wt 133.6 lb

## 2012-01-05 DIAGNOSIS — M25559 Pain in unspecified hip: Secondary | ICD-10-CM

## 2012-01-05 DIAGNOSIS — M719 Bursopathy, unspecified: Secondary | ICD-10-CM

## 2012-01-05 DIAGNOSIS — M7150 Other bursitis, not elsewhere classified, unspecified site: Secondary | ICD-10-CM

## 2012-01-05 MED ORDER — METHYLPREDNISOLONE ACETATE 80 MG/ML IJ SUSP: 80.0000 mg | Freq: Once | INTRAMUSCULAR | Status: DC

## 2012-01-05 NOTE — Progress Notes (Signed)
[facility]:  Office Visit  Chief Complaint:  Chief Complaint  Patient presents with  . Hip Pain    right    HPI: DAMIAN BUCKLES is a 66 y.o. male who complains of  1.5 month h/o right hip pain, when he lays on it, it is the only time he feels it, throbbing pain. He has NKI. Has not tried any  NSAIDs since has allergies. No hx of diabetes. No fevers, chills. No pain with weight bearing.  Past Medical History  Diagnosis Date  . Thyroid disease   . Allergy   . Depression   . Parkinson's disease    Past Surgical History  Procedure Date  . Mandible fracture surgery   . Hernia repair   . Wrist fracture surgery   . Rotator cuff repair   . Tonsillectomy    History   Social History  . Marital Status: Married    Spouse Name: [name]    Number of Children: 2  . Years of Education: 12+   Occupational History  . DISABILITY     due to Parkinson's   Social History Main Topics  . Smoking status: Never Smoker   . Smokeless tobacco: Never Used  . Alcohol Use: 0.0 - 4.2 oz/week    0-7 Cans of beer per week  . Drug Use: No  . Sexually Active: No   Other Topics Concern  . Not on file   Social History Narrative  . No narrative on file   Family History  Problem Relation Age of Onset  . Cancer Father     colon   Allergies  Allergen Reactions  . Lamictal (Lamotrigine) Other (See Comments)    Blisters in mouth  . Nsaids   . Penicillins    Prior to Admission medications   Medication Sig Start Date End Date Taking? Authorizing Provider  aspirin 81 MG tablet Take 160 mg by mouth daily.   Yes Historical Provider, MD  carbidopa-levodopa (SINEMET) 25-100 MG per tablet Take 1 tablet by mouth 3 (three) times daily.   Yes Historical Provider, MD  cholecalciferol (VITAMIN D) 1000 UNITS tablet Take 1,000 Units by mouth 2 (two) times daily.   Yes Historical Provider, MD  clonazePAM (KLONOPIN) 0.5 MG tablet Take 1 tablet (0.5 mg total) by mouth at bedtime as needed  (restless legs). 11/15/11  Yes Sherren Mocha, MD  fesoterodine (TOVIAZ) 4 MG TB24 Take 4 mg by mouth daily.   Yes Historical Provider, MD  ipratropium (ATROVENT) 0.03 % nasal spray Place 2 sprays into the nose 2 (two) times daily. 11/20/11  Yes Chelle S Jeffery, PA-C  iron polysaccharides (NIFEREX 60) 40-20 MG capsule Take 1 capsule by mouth daily.    Yes Historical Provider, MD  levothyroxine (SYNTHROID, LEVOTHROID) 75 MCG tablet TAKE ONE TABLET BY MOUTH DAILY 10/30/11  Yes Ryan M Dunn, PA-C  Multiple Vitamins-Minerals (MULTIVITAMIN WITH MINERALS) tablet Take 1 tablet by mouth daily.   Yes Historical Provider, MD  Rasagiline Mesylate (AZILECT) 1 MG TABS Take by mouth.   Yes Historical Provider, MD  rOPINIRole (REQUIP) 2 MG tablet Take one tablet at bedtime 12/19/11  Yes Morrell Riddle, PA-C  venlafaxine XR (EFFEXOR-XR) 37.5 MG 24 hr capsule  08/19/11  Yes Historical Provider, MD  vitamin B-12 (CYANOCOBALAMIN) 1000 MCG tablet Take 1,000 mcg by mouth daily.   Yes Historical Provider, MD  chlorpheniramine-HYDROcodone (TUSSIONEX PENNKINETIC ER) 10-8 MG/5ML LQCR Take 5 mLs by mouth every 12 (twelve) hours as  needed (cough). 11/20/11   Chelle S Jeffery, PA-C  doxycycline (VIBRAMYCIN) 100 MG capsule Take 1 capsule (100 mg total) by mouth 2 (two) times daily. 11/20/11   Chelle S Jeffery, PA-C  Guaifenesin (MUCINEX MAXIMUM STRENGTH) 1200 MG TB12 Take 1 tablet (1,200 mg total) by mouth every 12 (twelve) hours as needed. 11/20/11   Chelle S Jeffery, PA-C     ROS: The patient denies fevers, chills, night sweats, unintentional weight loss, chest pain, palpitations, wheezing, dyspnea on exertion, nausea, vomiting, abdominal pain, dysuria, hematuria, melena, numbness, weakness, or tingling.   All other systems have been reviewed and were otherwise negative with the exception of those mentioned in the HPI and as above.    PHYSICAL EXAM: Filed Vitals:   01/05/12 1220  BP: 130/86  Pulse: 69  Temp: 98.4 F (36.9 C)    Resp: 16   Filed Vitals:   01/05/12 1220  Height: 5\' 7"  (1.702 m)  Weight: 133 lb 9.6 oz (60.601 kg)   Body mass index is 20.92 kg/(m^2).  General: Alert, no acute distress, thin white male HEENT:  Normocephalic, atraumatic, oropharynx patent.  Cardiovascular:  Regular rate and rhythm, no rubs murmurs or gallops.  No Carotid bruits, radial pulse intact. No pedal edema.  Respiratory: Clear to auscultation bilaterally.  No wheezes, rales, or rhonchi.  No cyanosis, no use of accessory musculature GI: No organomegaly, abdomen is soft and non-tender, positive bowel sounds.  No masses. Skin: No rashes. Neurologic: Facial musculature symmetric. Psychiatric: Patient is appropriate throughout our interaction. Lymphatic: No cervical lymphadenopathy Musculoskeletal: Gait intact. Right hip tenderness at right greater trochanter Right hip-ROM nl, 5/5 strength, 2/2 DTR  No msk atrophy.   LABS: Results for orders placed in visit on 11/15/11  MAGNESIUM      Component Value Range   Magnesium 2.0  1.5 - 2.5 mg/dL  POTASSIUM      Component Value Range   Potassium 4.4  3.5 - 5.3 mEq/L     EKG/XRAY:   Primary read interpreted by Dr. Conley Rolls at [facility]. No fractures/subluxation   ASSESSMENT/PLAN: Encounter Diagnoses  Name Primary?  . Hip pain   . Bursitis Yes   Patient verbally consented to hip injection after risk and benefits explained.  Sterile technique used, patient did not have complications, tolerated procedure well 80 mg Depomedrol, 3 ml Marcaine, 1 ml Lidocaine without epi injected into bursa of right greater trochanter F/u prn      LE, THAO PHUONG, DO 01/05/2012 2:17 PM

## 2012-02-05 ENCOUNTER — Other Ambulatory Visit: Payer: Self-pay | Admitting: Physician Assistant

## 2012-02-05 DIAGNOSIS — G20 Parkinson's disease: Secondary | ICD-10-CM | POA: Diagnosis not present

## 2012-02-05 DIAGNOSIS — M25559 Pain in unspecified hip: Secondary | ICD-10-CM | POA: Diagnosis not present

## 2012-02-19 ENCOUNTER — Other Ambulatory Visit: Payer: Self-pay | Admitting: Physician Assistant

## 2012-03-04 DIAGNOSIS — H52229 Regular astigmatism, unspecified eye: Secondary | ICD-10-CM | POA: Diagnosis not present

## 2012-03-04 DIAGNOSIS — H1045 Other chronic allergic conjunctivitis: Secondary | ICD-10-CM | POA: Diagnosis not present

## 2012-03-04 DIAGNOSIS — H524 Presbyopia: Secondary | ICD-10-CM | POA: Diagnosis not present

## 2012-03-04 DIAGNOSIS — H5200 Hypermetropia, unspecified eye: Secondary | ICD-10-CM | POA: Diagnosis not present

## 2012-03-25 DIAGNOSIS — D485 Neoplasm of uncertain behavior of skin: Secondary | ICD-10-CM | POA: Diagnosis not present

## 2012-03-25 DIAGNOSIS — D239 Other benign neoplasm of skin, unspecified: Secondary | ICD-10-CM | POA: Diagnosis not present

## 2012-03-25 DIAGNOSIS — L821 Other seborrheic keratosis: Secondary | ICD-10-CM | POA: Diagnosis not present

## 2012-04-09 ENCOUNTER — Ambulatory Visit: Payer: Medicare Other

## 2012-04-09 ENCOUNTER — Ambulatory Visit (INDEPENDENT_AMBULATORY_CARE_PROVIDER_SITE_OTHER): Payer: Medicare Other | Admitting: Family Medicine

## 2012-04-09 VITALS — BP 96/61 | HR 68 | Temp 97.8°F | Resp 18 | Ht 68.0 in | Wt 131.0 lb

## 2012-04-09 DIAGNOSIS — E039 Hypothyroidism, unspecified: Secondary | ICD-10-CM | POA: Diagnosis not present

## 2012-04-09 DIAGNOSIS — M25569 Pain in unspecified knee: Secondary | ICD-10-CM

## 2012-04-09 DIAGNOSIS — G5700 Lesion of sciatic nerve, unspecified lower limb: Secondary | ICD-10-CM

## 2012-04-09 DIAGNOSIS — M25519 Pain in unspecified shoulder: Secondary | ICD-10-CM | POA: Diagnosis not present

## 2012-04-09 DIAGNOSIS — I7300 Raynaud's syndrome without gangrene: Secondary | ICD-10-CM

## 2012-04-09 DIAGNOSIS — M25559 Pain in unspecified hip: Secondary | ICD-10-CM

## 2012-04-09 LAB — TSH: TSH: 1.532 u[IU]/mL (ref 0.350–4.500)

## 2012-04-09 MED ORDER — LEVOTHYROXINE SODIUM 75 MCG PO TABS: ORAL_TABLET | ORAL | Status: DC

## 2012-04-09 NOTE — Patient Instructions (Addendum)
Work on the piriformis stretch with pulling knee towards opposite shoulder and other stretches of back and legs after walking.  Tylenol over the counter as needed for pain - recheck in next 2 weeks if not improving, sooner if stronger medicine needed.  - Return to the clinic or go to the nearest emergency room if any of your symptoms worsen or new symptoms occur. Wear gloves in cold weather.  This could be some raynaud's phenomenon, but would not start a new medicine at this point due to you blood pressure already on the lower side.  Follow up to discuss with Dr. [name] further.   tylenol for your shoulder pain and recheck in next few weeks for possible x rays.   Your should receive a call or letter about your lab results within the next week to 10 days.     Piriformis Syndrome with Rehab Piriformis syndrome is a condition the affects the nervous system in the area of the hip, and is characterized by pain and possibly a loss of feeling in the backside (posterior) thigh that may extend down the entire length of the leg. The symptoms are caused by an increase in pressure on the sciatic nerve by the piriformis muscle, which is on the back of the hip and is responsible for externally rotating the hip. The sciatic nerve and its branches connect to much of the leg. Normally the sciatic nerve runs between the piriformis muscle and other muscles. However, in certain individuals the nerve runs through the muscle, which causes an increase in pressure on the nerve and results in the symptoms of piriformis syndrome. SYMPTOMS   Pain, tingling, numbness, or burning in the back of the thigh that may also extend down the entire leg.  Occasionally, tenderness in the buttock.  Loss of function of the leg.  Pain that worsens when using the piriformis muscle (running, jumping, or stairs).  Pain that increases with prolonged sitting.  Pain that is lessened by laying flat on the back. CAUSES   Piriformis  syndrome is the result of an increase in pressure placed on the sciatic nerve. Often times piriformis syndrome is an overuse injury.  Stress placed on the nerve from a sudden increase in the intensity, frequency, or duration of training.  Compensation of other extremity injuries. RISK INCREASES WITH:  Sports that involve the piriformis muscle (running, walking or jumping).  You are born with (congenital) a defect in which the sciatic nerve passes through the muscle. PREVENTION  Warm up and stretch properly before activity.  Allow for adequate recovery between workouts.  Maintain physical fitness:  Strength, flexibility, and endurance.  Cardiovascular fitness. PROGNOSIS  If treated properly, then the symptoms of piriformis syndrome usually resolve in 2 to 6 weeks. RELATED COMPLICATIONS   Persistent and possibly permanent pain and numbness in the lower extremity.  Weakness of the extremity that may progress to disability and inability to compete. TREATMENT  The most effective treatment for piriformis syndrome is rest from any activities that aggravate the symptoms. Ice and pain medication may help reduce pain and inflammation. The use of strengthening and stretching exercises may help reduce pain with activity. These exercises may be performed at home or with a therapist. A referral to a therapist may be given for further evaluation and treatment, such as ultrasound. Corticosteroid injections may be given to reduce inflammation that is causing pressure to be placed on the sciatic nerve. If non-surgical (conservative) treatment is unsuccessful, then surgery may be recommended.  MEDICATION   If pain medication is necessary, then nonsteroidal anti-inflammatory medications, such as aspirin and ibuprofen, or other minor pain relievers, such as acetaminophen, are often recommended.  Do not take pain medication for 7 days before surgery.  Prescription pain relievers may be given if deemed  necessary by your caregiver. Use only as directed and only as much as you need.  Corticosteroid injections may be given by your caregiver. These injections should be reserved for the most serious cases, because they may only be given a certain number of times. HEAT AND COLD:   Cold treatment (icing) relieves pain and reduces inflammation. Cold treatment should be applied for 10 to 15 minutes every 2 to 3 hours for inflammation and pain and immediately after any activity that aggravates your symptoms. Use ice packs or massage the area with a piece of ice (ice massage).  Heat treatment may be used prior to performing the stretching and strengthening activities prescribed by your caregiver, physical therapist, or athletic trainer. Use a heat pack or soak the injury in warm water. SEEK IMMEDIATE MEDICAL CARE IF:  Treatment seems to offer no benefit, or the condition worsens.  Any medications produce adverse side effects. EXERCISES RANGE OF MOTION (ROM) AND STRETCHING EXERCISES - Piriformis Syndrome These exercises may help you when beginning to rehabilitate your injury. Your symptoms may resolve with or without further involvement from your physician, physical therapist or athletic trainer. While completing these exercises, remember:   Restoring tissue flexibility helps normal motion to return to the joints. This allows healthier, less painful movement and activity.  An effective stretch should be held for at least 30 seconds.  A stretch should never be painful. You should only feel a gentle lengthening or release in the stretched tissue. STRETCH - Hip Rotators  Lie on your back on a firm surface. Grasp your right / left knee with your right / left hand and your ankle with your opposite hand.  Keeping your hips and shoulders firmly planted, gently pull your right / left knee and rotate your lower leg toward your opposite shoulder until you feel a stretch in your buttocks.  Hold this stretch  for __________ seconds. Repeat this stretch __________ times. Complete this stretch __________ times per day. STRETCH  Iliotibial Band  On the floor or bed, lie on your side so your right / left leg is on top. Bend your knee and grab your ankle.  Slowly bring your knee back so that your thigh is in line with your trunk. Keep your heel at your buttocks and gently arch your back so your head, shoulders and hips line up.  Slowly lower your leg so that your knee approaches the floor/bed until you feel a gentle stretch on the outside of your right / left thigh. If you do not feel a stretch and your knee will not fall farther, place the heel of your opposite foot on top of your knee and pull your thigh down farther.  Hold this stretch for __________ seconds. Repeat __________ times. Complete __________ times per day. STRENGTHENING EXERCISES - Piriformis Syndrome  These are some of the caregiver again or until your symptoms are resolved. Remember:   Strong muscles with good endurance tolerate stress better.  Do the exercises as initially prescribed by your caregiver. Progress slowly with each exercise, gradually increasing the number of repetitions and weight used under their guidance. STRENGTH - Hip Abductors, Straight Leg Raises Be aware of your form throughout the entire exercise so  that you exercise the correct muscles. Sloppy form means that you are not strengthening the correct muscles.  Lie on your side so that your head, shoulders, knee and hip line up. You may bend your lower knee to help maintain your balance. Your right / left leg should be on top.  Roll your hips slightly forward, so that your hips are stacked directly over each other and your right / left knee is facing forward.  Lift your top leg up 4-6 inches, leading with your heel. Be sure that your foot does not drift forward or that your knee does not roll toward the ceiling.  Hold this position for __________ seconds. You  should feel the muscles in your outer hip lifting (you may not notice this until your leg begins to tire).  Slowly lower your leg to the starting position. Allow the muscles to fully relax before beginning the next repetition. Repeat __________ times. Complete this exercise __________ times per day.  STRENGTH - Hip Abductors, Quadriped  On a firm, lightly padded surface, position yourself on your hands and knees. Your hands should be directly below your shoulders and your knees should be directly below your hips.  Keeping your right / left knee bent, lift your leg out to the side. Keep your legs level and in line with your shoulders.  Position yourself on your hands and knees.  Hold for __________ seconds.  Keeping your trunk steady and your hips level, slowly lower your leg to the starting position. Repeat __________ times. Complete this exercise __________ times per day.  STRENGTH - Hip Abductors, Standing  Tie one end of a rubber exercise band/tubing to a secure surface (table, pole) and tie a loop at the other end.  Place the loop around your right / left ankle. Keeping your ankle with the band directly opposite of the secured end, step away until there is tension in the tube/band.  Hold onto a chair as needed for balance.  Keeping your back upright, your shoulders over your hips, and your toes pointing forward, lift your right / left leg out to your side. Be sure to lift your leg with your hip muscles. Do not "throw" your leg or tip your body to lift your leg.  Slowly and with control, return to the starting position. Repeat exercise __________ times. Complete this exercise __________ times per day.  Document Released: 02/06/2005 Document Revised: 08/08/2011 Document Reviewed: 05/21/2008 [facility] Patient Information 2013 [place], [place].

## 2012-04-09 NOTE — Progress Notes (Signed)
Subjective:    Patient ID: [name], male    DOB: 1945/12/07, 67 y.o.   MRN: [id number]  HPI [name] is a 67 y.o. male Here for multiple concerns, and med refills. PCP: Dr. [name].  appt in March with Dr. [name].   Primary concern. r greater than L Hip pain - seen in November by Dr. Conley Rolls - injected R hip for trochanteric bursitis. No relief.  No attempted treatments. NKI - pain there for 4 months. No eval at ortho. trouble sleeping at night due to pain. No recent narcotics - makes sick on stomach. Has taken ultram ok in past. Walks 6 miles per day.   Hand pain - noticed after being in the cold recently - hand feels cold - turns white at times or blue in the cold. like no circulation - comes and goes - notes with going outside in cold - more if not wearing gloves. Some redness afterwards.  Shoulder pain - L shoulder pain for past 2 months - NKI.   Med refill - need thyroid follow up - takes synthroid every other day, and on other days. Same dosing for past approx 1 and 1/2 years.    Review of Systems  Endocrine: Negative for cold intolerance and heat intolerance.  Musculoskeletal: Positive for back pain and arthralgias. Negative for joint swelling.  Skin: Positive for color change.  Neurological: Positive for numbness. Negative for weakness.         Objective:   Physical Exam  Vitals reviewed. Constitutional: He is oriented to person, place, and time. He appears well-developed and well-nourished. No distress.  HENT:  Head: Normocephalic and atraumatic.  Pulmonary/Chest: Effort normal.  Musculoskeletal:       Left shoulder: He exhibits normal range of motion, no tenderness, no bony tenderness and normal strength.       Right hip: He exhibits normal range of motion, normal strength and no tenderness.       Left hip: He exhibits normal range of motion, normal strength and no tenderness.       Lumbar back: He exhibits normal range of motion and no tenderness.         Back:       Legs: Neurological: He is alert and oriented to person, place, and time.  Skin: Skin is warm and dry. No rash noted.  Psychiatric: He has a normal mood and affect. His behavior is normal.   [facility] reading (PRIMARY) by  Dr. Neva Seat: LS spine: L scoliosis - otherwise negative.      Assessment & Plan:  [name] is a 67 y.o. male  Pain in joint, pelvic region and thigh - sciatica type symptoms, but may be Piriformis syndrome - Plan: DG Lumbar Spine 2-3 Views - no acute findings except scoliosis - taught piriformis stretches, discussed IT band stretches as well after walking, tylenol trial.  Held on narcotics given prior reactions and ultram contraindicated with other meds. rtc precautions.   Pain in joint, shoulder region - ? Bursitis. From and full RTC strength.  Tylenol as above and recheck in few weeks.   Unspecified hypothyroidism - Plan: TSH, levothyroxine (SYNTHROID, LEVOTHROID) 75 MCG tablet refilled for QOD dosing in conjunction with QOD.   Hand pain - possible Raynaud's phenomenon - avoid cold exposure, wear gloves.  Would not start CCB at this point given lower BP.    Patient Instructions  Work on the piriformis stretch with pulling knee towards opposite shoulder  and other stretches of back and legs after walking.  Tylenol over the counter as needed for pain - recheck in next 2 weeks if not improving, sooner if stronger medicine needed.  - Return to the clinic or go to the nearest emergency room if any of your symptoms worsen or new symptoms occur. Wear gloves in cold weather.  This could be some raynaud's phenomenon, but would not start a new medicine at this point due to you blood pressure already on the lower side.  Follow up to discuss with Dr. [name] further.   tylenol for your shoulder pain and recheck in next few weeks for possible x rays.   Your should receive a call or letter about your lab results within the next week to 10 days.      Piriformis Syndrome with Rehab Piriformis syndrome is a condition the affects the nervous system in the area of the hip, and is characterized by pain and possibly a loss of feeling in the backside (posterior) thigh that may extend down the entire length of the leg. The symptoms are caused by an increase in pressure on the sciatic nerve by the piriformis muscle, which is on the back of the hip and is responsible for externally rotating the hip. The sciatic nerve and its branches connect to much of the leg. Normally the sciatic nerve runs between the piriformis muscle and other muscles. However, in certain individuals the nerve runs through the muscle, which causes an increase in pressure on the nerve and results in the symptoms of piriformis syndrome. SYMPTOMS   Pain, tingling, numbness, or burning in the back of the thigh that may also extend down the entire leg.  Occasionally, tenderness in the buttock.  Loss of function of the leg.  Pain that worsens when using the piriformis muscle (running, jumping, or stairs).  Pain that increases with prolonged sitting.  Pain that is lessened by laying flat on the back. CAUSES   Piriformis syndrome is the result of an increase in pressure placed on the sciatic nerve. Often times piriformis syndrome is an overuse injury.  Stress placed on the nerve from a sudden increase in the intensity, frequency, or duration of training.  Compensation of other extremity injuries. RISK INCREASES WITH:  Sports that involve the piriformis muscle (running, walking or jumping).  You are born with (congenital) a defect in which the sciatic nerve passes through the muscle. PREVENTION  Warm up and stretch properly before activity.  Allow for adequate recovery between workouts.  Maintain physical fitness:  Strength, flexibility, and endurance.  Cardiovascular fitness. PROGNOSIS  If treated properly, then the symptoms of piriformis syndrome usually  resolve in 2 to 6 weeks. RELATED COMPLICATIONS   Persistent and possibly permanent pain and numbness in the lower extremity.  Weakness of the extremity that may progress to disability and inability to compete. TREATMENT  The most effective treatment for piriformis syndrome is rest from any activities that aggravate the symptoms. Ice and pain medication may help reduce pain and inflammation. The use of strengthening and stretching exercises may help reduce pain with activity. These exercises may be performed at home or with a therapist. A referral to a therapist may be given for further evaluation and treatment, such as ultrasound. Corticosteroid injections may be given to reduce inflammation that is causing pressure to be placed on the sciatic nerve. If non-surgical (conservative) treatment is unsuccessful, then surgery may be recommended.  MEDICATION   If pain medication is necessary, then nonsteroidal  anti-inflammatory medications, such as aspirin and ibuprofen, or other minor pain relievers, such as acetaminophen, are often recommended.  Do not take pain medication for 7 days before surgery.  Prescription pain relievers may be given if deemed necessary by your caregiver. Use only as directed and only as much as you need.  Corticosteroid injections may be given by your caregiver. These injections should be reserved for the most serious cases, because they may only be given a certain number of times. HEAT AND COLD:   Cold treatment (icing) relieves pain and reduces inflammation. Cold treatment should be applied for 10 to 15 minutes every 2 to 3 hours for inflammation and pain and immediately after any activity that aggravates your symptoms. Use ice packs or massage the area with a piece of ice (ice massage).  Heat treatment may be used prior to performing the stretching and strengthening activities prescribed by your caregiver, physical therapist, or athletic trainer. Use a heat pack or soak  the injury in warm water. SEEK IMMEDIATE MEDICAL CARE IF:  Treatment seems to offer no benefit, or the condition worsens.  Any medications produce adverse side effects. EXERCISES RANGE OF MOTION (ROM) AND STRETCHING EXERCISES - Piriformis Syndrome These exercises may help you when beginning to rehabilitate your injury. Your symptoms may resolve with or without further involvement from your physician, physical therapist or athletic trainer. While completing these exercises, remember:   Restoring tissue flexibility helps normal motion to return to the joints. This allows healthier, less painful movement and activity.  An effective stretch should be held for at least 30 seconds.  A stretch should never be painful. You should only feel a gentle lengthening or release in the stretched tissue. STRETCH - Hip Rotators  Lie on your back on a firm surface. Grasp your right / left knee with your right / left hand and your ankle with your opposite hand.  Keeping your hips and shoulders firmly planted, gently pull your right / left knee and rotate your lower leg toward your opposite shoulder until you feel a stretch in your buttocks.  Hold this stretch for __________ seconds. Repeat this stretch __________ times. Complete this stretch __________ times per day. STRETCH  Iliotibial Band  On the floor or bed, lie on your side so your right / left leg is on top. Bend your knee and grab your ankle.  Slowly bring your knee back so that your thigh is in line with your trunk. Keep your heel at your buttocks and gently arch your back so your head, shoulders and hips line up.  Slowly lower your leg so that your knee approaches the floor/bed until you feel a gentle stretch on the outside of your right / left thigh. If you do not feel a stretch and your knee will not fall farther, place the heel of your opposite foot on top of your knee and pull your thigh down farther.  Hold this stretch for __________  seconds. Repeat __________ times. Complete __________ times per day. STRENGTHENING EXERCISES - Piriformis Syndrome  These are some of the caregiver again or until your symptoms are resolved. Remember:   Strong muscles with good endurance tolerate stress better.  Do the exercises as initially prescribed by your caregiver. Progress slowly with each exercise, gradually increasing the number of repetitions and weight used under their guidance. STRENGTH - Hip Abductors, Straight Leg Raises Be aware of your form throughout the entire exercise so that you exercise the correct muscles. Sloppy form means that  you are not strengthening the correct muscles.  Lie on your side so that your head, shoulders, knee and hip line up. You may bend your lower knee to help maintain your balance. Your right / left leg should be on top.  Roll your hips slightly forward, so that your hips are stacked directly over each other and your right / left knee is facing forward.  Lift your top leg up 4-6 inches, leading with your heel. Be sure that your foot does not drift forward or that your knee does not roll toward the ceiling.  Hold this position for __________ seconds. You should feel the muscles in your outer hip lifting (you may not notice this until your leg begins to tire).  Slowly lower your leg to the starting position. Allow the muscles to fully relax before beginning the next repetition. Repeat __________ times. Complete this exercise __________ times per day.  STRENGTH - Hip Abductors, Quadriped  On a firm, lightly padded surface, position yourself on your hands and knees. Your hands should be directly below your shoulders and your knees should be directly below your hips.  Keeping your right / left knee bent, lift your leg out to the side. Keep your legs level and in line with your shoulders.  Position yourself on your hands and knees.  Hold for __________ seconds.  Keeping your trunk steady and your  hips level, slowly lower your leg to the starting position. Repeat __________ times. Complete this exercise __________ times per day.  STRENGTH - Hip Abductors, Standing  Tie one end of a rubber exercise band/tubing to a secure surface (table, pole) and tie a loop at the other end.  Place the loop around your right / left ankle. Keeping your ankle with the band directly opposite of the secured end, step away until there is tension in the tube/band.  Hold onto a chair as needed for balance.  Keeping your back upright, your shoulders over your hips, and your toes pointing forward, lift your right / left leg out to your side. Be sure to lift your leg with your hip muscles. Do not "throw" your leg or tip your body to lift your leg.  Slowly and with control, return to the starting position. Repeat exercise __________ times. Complete this exercise __________ times per day.  Document Released: 02/06/2005 Document Revised: 08/08/2011 Document Reviewed: 05/21/2008 [facility] Patient Information 2013 [place], [place].

## 2012-04-27 ENCOUNTER — Ambulatory Visit (INDEPENDENT_AMBULATORY_CARE_PROVIDER_SITE_OTHER): Payer: Medicare Other | Admitting: Family Medicine

## 2012-04-27 VITALS — BP 122/76 | HR 72 | Temp 97.6°F | Resp 16 | Ht 67.5 in | Wt 129.8 lb

## 2012-04-27 DIAGNOSIS — R05 Cough: Secondary | ICD-10-CM

## 2012-04-27 DIAGNOSIS — J069 Acute upper respiratory infection, unspecified: Secondary | ICD-10-CM | POA: Diagnosis not present

## 2012-04-27 DIAGNOSIS — R059 Cough, unspecified: Secondary | ICD-10-CM

## 2012-04-27 MED ORDER — BENZONATATE 100 MG PO CAPS: ORAL_CAPSULE | ORAL | Status: DC

## 2012-04-27 MED ORDER — FLUTICASONE PROPIONATE 50 MCG/ACT NA SUSP: 2.0000 | Freq: Every day | NASAL | Status: DC

## 2012-04-27 NOTE — Progress Notes (Signed)
Subjective: 67 year old man who has been ill for several days with upper respiratory congestion and pressure. He is not blowing much out of his nose. He does not smoke. He is coughing, but not coughing up anything. He has not been running a fever. He has a history of parkinsonism.  Objective: He says right ear bothered him some but the TMs are entirely normal. Just a tiny bit of wax in the right ear. His throat is clear. Neck supple without nodes. Sinuses nontender. No carotid bruits. Chest clear to auscultation. Heart regular without murmurs. And soft without masses.  Assessment: URI Cough  Plan: Fluticasone spray Tessalon Perles

## 2012-04-27 NOTE — Patient Instructions (Signed)
Drink lots of fluids  Mucinex plain   Use the nose spray 2 sprays each nostril twice daily for 3 days, then once daily  Use the cough pills 1-2 pills 3 times daily for cough.

## 2012-04-30 ENCOUNTER — Ambulatory Visit (INDEPENDENT_AMBULATORY_CARE_PROVIDER_SITE_OTHER): Payer: Medicare Other | Admitting: Internal Medicine

## 2012-04-30 VITALS — BP 112/66 | HR 64 | Temp 97.8°F | Resp 16 | Ht 68.25 in | Wt 135.4 lb

## 2012-04-30 DIAGNOSIS — R05 Cough: Secondary | ICD-10-CM | POA: Diagnosis not present

## 2012-04-30 DIAGNOSIS — J069 Acute upper respiratory infection, unspecified: Secondary | ICD-10-CM

## 2012-04-30 DIAGNOSIS — H109 Unspecified conjunctivitis: Secondary | ICD-10-CM

## 2012-04-30 MED ORDER — GENTAMICIN SULFATE 0.3 % OP SOLN: 1.0000 [drp] | OPHTHALMIC | Status: DC

## 2012-04-30 MED ORDER — HYDROCODONE-HOMATROPINE 5-1.5 MG/5ML PO SYRP: 5.0000 mL | ORAL_SOLUTION | Freq: Four times a day (QID) | ORAL | Status: DC | PRN

## 2012-04-30 MED ORDER — PREDNISONE 20 MG PO TABS: ORAL_TABLET | ORAL | Status: DC

## 2012-04-30 NOTE — Progress Notes (Signed)
  Subjective:    Patient ID: [name], male    DOB: Nov 27, 1945, 67 y.o.   MRN: [id number]  HPI See last OV-not better incr eye irrit w/ redness and morning crusting No chg vision Still no fever and nonprod cough  He has a history of this kind of syndrome and often responds to prednisone  Review of Systems     Objective:   Physical Exam Vs wnl Eyes w/ injec conj bilat/perrla/no d/c tms clear Nares boggy w/clear rhin thr clear No nodes Lungs clear       Assessment & Plan:   Problem #1 conjunctivitis Problem #2 viral upper respiratory infection Problem #3 cough  Meds ordered this encounter  Medications  . predniSONE (DELTASONE) 20 MG tablet    Sig: 3/3/2/2/1/1 single daily dose for 6 days    Dispense:  12 tablet    Refill:  0  . HYDROcodone-homatropine (HYCODAN) 5-1.5 MG/5ML syrup    Sig: Take 5 mLs by mouth every 6 (six) hours as needed for cough.    Dispense:  120 mL    Refill:  0  . gentamicin (GARAMYCIN) 0.3 % ophthalmic solution    Sig: Place 1 drop into both eyes every 4 (four) hours.    Dispense:  5 mL    Refill:  0   recheck one week if not well

## 2012-05-13 ENCOUNTER — Encounter: Payer: Self-pay | Admitting: Internal Medicine

## 2012-05-13 ENCOUNTER — Ambulatory Visit (INDEPENDENT_AMBULATORY_CARE_PROVIDER_SITE_OTHER): Payer: Medicare Other | Admitting: Internal Medicine

## 2012-05-13 VITALS — BP 120/81 | HR 65 | Temp 96.9°F | Resp 16 | Ht 68.0 in | Wt 130.0 lb

## 2012-05-13 DIAGNOSIS — E039 Hypothyroidism, unspecified: Secondary | ICD-10-CM | POA: Diagnosis not present

## 2012-05-13 DIAGNOSIS — M25559 Pain in unspecified hip: Secondary | ICD-10-CM | POA: Diagnosis not present

## 2012-05-13 DIAGNOSIS — G20 Parkinson's disease: Secondary | ICD-10-CM | POA: Diagnosis not present

## 2012-05-13 DIAGNOSIS — M25551 Pain in right hip: Secondary | ICD-10-CM

## 2012-05-13 LAB — COMPREHENSIVE METABOLIC PANEL
ALT: 22 U/L (ref 0–53)
Alkaline Phosphatase: 90 U/L (ref 39–117)
CO2: 27 mEq/L (ref 19–32)
Sodium: 140 mEq/L (ref 135–145)
Total Bilirubin: 0.5 mg/dL (ref 0.3–1.2)
Total Protein: 7 g/dL (ref 6.0–8.3)

## 2012-05-13 LAB — TSH: TSH: 1.079 u[IU]/mL (ref 0.350–4.500)

## 2012-05-13 MED ORDER — METHYLPREDNISOLONE ACETATE 40 MG/ML IJ SUSP
120.0000 mg | Freq: Once | INTRAMUSCULAR | Status: AC
  Administered 2012-05-13: 120 mg via INTRAMUSCULAR

## 2012-05-13 NOTE — Progress Notes (Signed)
  Subjective:    Patient ID: CEPHUS TUPY, male    DOB: 08-Mar-1945, 67 y.o.   MRN: [id number]  HPI Parkinsons controlled, has fallen few times but exercises a lot with dog. Hypothyroid controlled. Has hip pain for last few weeks, xr neg by Dr. Alwyn Ren and piriformis stretches given. Will do lab cks.   Review of Systems unchanged    Objective:   Physical Exam  Vitals reviewed. Constitutional: He is oriented to person, place, and time. He appears well-developed and well-nourished. No distress.  Eyes: EOM are normal.  Neck: Neck supple. No thyromegaly present.  Cardiovascular: Normal rate, regular rhythm and normal heart sounds.   Pulmonary/Chest: Effort normal and breath sounds normal.  Musculoskeletal: He exhibits tenderness.       Right hip: He exhibits tenderness. He exhibits normal range of motion, normal strength, no bony tenderness and no crepitus.       Legs: Pain deep at x  Neurological: He is alert and oriented to person, place, and time. No cranial nerve deficit. He exhibits abnormal muscle tone. Coordination abnormal.  Psychiatric: He has a normal mood and affect. His behavior is normal. Judgment and thought content normal.          Assessment & Plan:  Right hip strain Depomedrol 120mg  im RF meds 1 yr

## 2012-05-15 ENCOUNTER — Encounter: Payer: Self-pay | Admitting: *Deleted

## 2012-06-05 ENCOUNTER — Encounter: Payer: Self-pay | Admitting: Neurology

## 2012-06-05 ENCOUNTER — Ambulatory Visit (INDEPENDENT_AMBULATORY_CARE_PROVIDER_SITE_OTHER): Payer: Medicare Other | Admitting: Neurology

## 2012-06-05 VITALS — BP 121/75 | HR 60 | Temp 97.8°F | Ht 69.0 in | Wt 129.0 lb

## 2012-06-05 DIAGNOSIS — G20 Parkinson's disease: Secondary | ICD-10-CM

## 2012-06-05 DIAGNOSIS — F329 Major depressive disorder, single episode, unspecified: Secondary | ICD-10-CM | POA: Diagnosis not present

## 2012-06-05 DIAGNOSIS — G20A1 Parkinson's disease without dyskinesia, without mention of fluctuations: Secondary | ICD-10-CM

## 2012-06-05 DIAGNOSIS — G2581 Restless legs syndrome: Secondary | ICD-10-CM | POA: Diagnosis not present

## 2012-06-05 HISTORY — DX: Parkinson's disease: G20

## 2012-06-05 HISTORY — DX: Parkinson's disease without dyskinesia, without mention of fluctuations: G20.A1

## 2012-06-05 NOTE — Progress Notes (Signed)
Subjective:    Patient ID: [name] is a 67 y.o. male.  HPI Interim history:  [name] is a very pleasant 67 year old right-handed gentleman who presents for followup consultation of his Parkinson's disease. He is unaccompanied today. Today is his first visit with me and he was previously following with Dr. Fayrene Fearing [facility] and was last seen by him on 02/05/2012, at which time he was complaining of right hip pain. No medication changes were done at the time. He is currently on Synthroid, Sinemet 25/100 mg strength one tablet 3 times a day at 6:30, known and 5 PM. He is on Requip 2 mg strength half a pill at 6:30, half a pill at known half a pill at 5 PM and one and a half a pill at 9 PM. He is on rasagiline 1 mg once daily, baby aspirin, multivitamin, Toviaz. He has an underlying medical history of depression, sleep apnea, restless leg syndrome, hypothyroidism, mild cognitive impairment and carpal tunnel syndrome. He denies any new issues or illness or symptoms and no new meds. He did get a steroid injection into the R hip about a month ago, which helped the pain. He worked for [employer]. He lives with his wife in a one storey at [facility]. They have 2 grown children, son and daughter, both in the area. Two MAs and 4 cousins with Huntington's and he has been tested and was negative for it. Dr. Sandria Manly arranged the genetic testing. He has occasional depressive Sx, but better when he can get out and walk, he walks about 6 miles/day. He has improved in his RLS Sx since going to 1 1/2 pills at night some 4 mo ago.   I reviewed Dr. Imagene Gurney prior notes and the patient's record to below is a summary of that review:  67 year old right-handed gentleman with right sided parkinsonian symptoms as well as a positive family history of Huntington's disease who was diagnosed with Parkinson's disease in July 2009. He was initially started on Requip, Azilect and and Sinemet. He had side effects from the Requip  including excessive spending, daytime sleepiness and hypersexuality.  His Requip dose was therefore decreased. He has restless leg symptoms. He exercises regularly. He denies memory loss and has a history of depression. He has OSA but is not using CPAP currently, but he tried. He is independent in his ADLs. He has had visual hallucinations. He fell in October 2011 fracturing his left distal radius, requiring surgery in December 2011. He had right-sided leg cramps improved with Flexeril, but he stopped the medication. He denies orthostatic hypotension, leg edema, or compulsive behavior. He has bladder incontinence. In December 2013 his MMSE was 29, clock drawing was poor and animal fluency was 17, geriatric depression scale was 8/15.  His Past Medical History Is Significant For: Past Medical History  Diagnosis Date  . Thyroid disease   . Allergy   . Depression   . Parkinson's disease   . Anemia     His Past Surgical History Is Significant For: Past Surgical History  Procedure Laterality Date  . Mandible fracture surgery    . Hernia repair    . Wrist fracture surgery    . Rotator cuff repair    . Tonsillectomy    . Fracture surgery      jaw  . Vasectomy    . Appendectomy      His Family History Is Significant For: Family History  Problem Relation Age of Onset  . Cancer  Father     colon    His Social History Is Significant For: History   Social History  . Marital Status: Married    Spouse Name: [name]    Number of Children: 2  . Years of Education: 12+   Occupational History  . DISABILITY     due to Parkinson's   Social History Main Topics  . Smoking status: Never Smoker   . Smokeless tobacco: Never Used  . Alcohol Use: 0 - 4.2 oz/week    0-7 Cans of beer per week  . Drug Use: No  . Sexually Active: No   Other Topics Concern  . None   Social History Narrative  . None    His Allergies Are:  Allergies  Allergen Reactions  . Ibuprofen Other (See Comments)     Blood in stool.  . Lamictal (Lamotrigine) Other (See Comments)    Blisters in mouth  . Nsaids   . Penicillins   :   His Current Medications Are:  Outpatient Encounter Prescriptions as of 06/05/2012  Medication Sig Dispense Refill  . aspirin 81 MG tablet Take 160 mg by mouth daily.      . carbidopa-levodopa (SINEMET) 25-100 MG per tablet Take 1 tablet by mouth 3 (three) times daily.      . fesoterodine (TOVIAZ) 4 MG TB24 Take 4 mg by mouth daily.      . fluticasone (FLONASE) 50 MCG/ACT nasal spray Place 2 sprays into the nose daily.  16 g  1  . levothyroxine (SYNTHROID, LEVOTHROID) 50 MCG tablet TAKE 1 TABLET BY MOUTH EVERY OTHER DAY  30 tablet  5  . levothyroxine (SYNTHROID, LEVOTHROID) 75 MCG tablet TAKE ONE TABLET BY MOUTH every other day.  30 tablet  5  . Multiple Vitamins-Minerals (MULTIVITAMIN WITH MINERALS) tablet Take 1 tablet by mouth daily.      . Rasagiline Mesylate (AZILECT) 1 MG TABS Take by mouth.      Marland Kitchen rOPINIRole (REQUIP) 2 MG tablet TAKE 1 TABLET BY MOUTH AT BEDTIME  30 tablet  0  . benzonatate (TESSALON) 100 MG capsule       . gentamicin (GARAMYCIN) 0.3 % ophthalmic solution Place 1 drop into both eyes every 4 (four) hours.  5 mL  0  . HYDROcodone-homatropine (HYCODAN) 5-1.5 MG/5ML syrup Take 5 mLs by mouth every 6 (six) hours as needed for cough.  120 mL  0  . predniSONE (DELTASONE) 20 MG tablet 3/3/2/2/1/1 single daily dose for 6 days  12 tablet  0   No facility-administered encounter medications on file as of 06/05/2012.  :  Review of Systems  Constitutional: Positive for fatigue.  HENT: Positive for hearing loss, trouble swallowing and tinnitus.   Gastrointestinal: Positive for constipation.  Endocrine: Positive for polydipsia.  Genitourinary:       Impotence  Musculoskeletal: Positive for myalgias.       Cramps   Allergic/Immunologic: Positive for environmental allergies.  Neurological: Positive for dizziness, tremors and speech difficulty (slurred speech).        Memory loss  Hematological: Bruises/bleeds easily.       Anemia  Psychiatric/Behavioral: Positive for confusion, sleep disturbance and dysphoric mood. The patient is nervous/anxious.     Objective:  Neurologic Exam  Physical Exam Physical Examination:   Filed Vitals:   06/05/12 0913  BP: 121/75  Pulse: 60  Temp: 97.8 F (36.6 C)    General Examination: The patient is a very pleasant 67 y.o. male in no acute distress.  HEENT: Normocephalic, atraumatic, pupils are equal, round and reactive to light and accommodation. Funduscopic exam is normal with sharp disc margins noted. Extraocular tracking shows mild saccadic breakdown without nystagmus noted. There is no limitation to his gaze. There is mild decrease in eye blink rate. Hearing is intact. Tympanic membranes are clear bilaterally. Face is symmetric with moderate facial masking and normal facial sensation. There is no lip, neck or jaw tremor. Neck is moderately rigid with intact passive ROM. There are no carotid bruits on auscultation. Oropharynx exam reveals mild mouth dryness. No significant airway crowding is noted. Mallampati is class II. Tongue protrudes centrally and palate elevates symmetrically.    Chest: is clear to auscultation without wheezing, rhonchi or crackles noted.  Heart: sounds are regular and normal without murmurs, rubs or gallops noted.   Abdomen: is soft, non-tender and non-distended with normal bowel sounds appreciated on auscultation.  Extremities: There is no pitting edema in the distal lower extremities bilaterally. Pedal pulses are intact.  Skin: is warm and dry with no trophic changes noted.  Musculoskeletal: exam reveals no obvious joint deformities, tenderness or joint swelling or erythema.  Neurologically:  Mental status: The patient is awake and alert, paying good  attention. He is able to completely provide the history. He is oriented to: person, place, time/date, situation, day of week,  month of year and year. His memory, attention, language and knowledge are intact. There is no aphasia, agnosia, apraxia or anomia. There is a mild degree of bradyphrenia. Speech is mildly hypophonic with mild dysarthria noted. Mood is congruent and affect is normal.  Cranial nerves are as described above under HEENT exam. In addition, shoulder shrug is normal with equal shoulder height noted.  Motor exam: Normal bulk, and strength for age is noted. Tone is mildly rigid with presence of cogwheeling in the right upper extremity. There is overall moderate bradykinesia. There is no drift or rebound. There is no tremor.  Romberg is negative. Reflexes are 2+ in the upper extremities and 2+ in the lower extremities. Fine motor skills exam reveals: Finger taps are moderately impaired on the right and mildly impaired on the left. Hand movements are moderately impaired on the right and moderately impaired on the left. RAP (rapid alternating patting) is moderately impaired on the right and moderately impaired on the left. Foot taps are moderately impaired on the right and mildly impaired on the left. Foot agility (in the form of heel stomping) is moderately impaired on the right and mildly impaired on the left.    Cerebellar testing shows no dysmetria or intention tremor on finger to nose testing. Heel to shin is unremarkable bilaterally. There is no truncal or gait ataxia.   Sensory exam is intact to light touch, pinprick, vibration, temperature sense and proprioception in the upper and lower extremities.   Gait, station and balance exan: He stands up from the seated position with no significance difficulty and needs no assistance. No veering to one side is noted. He is not noted to lean to the side. Posture is mildly stooped. Stance is narrow-based. He walks with decrease in stride length and pace and decreased arm swing on the right and mild dystonic posturing with the right arm and hand. He turns in en bloc.  Tandem walk is not possible. Balance is mildly impaired. He is able to do a toe or heel stance.     Assessment and Plan:   Assessment and Plan:  In summary, [name] is a very  pleasant 67 y.o.-year old male with a history of right-sided predominant Parkinson's disease with mostly akinetic-rigid symptoms and signs. His physical exam is stable. He is doing fairly well at this time and I reassured the patient in that regard.  I had a long chat with the patient about my findings and the diagnosis of PD, the prognosis and treatment options. We talked about medical treatments and non-pharmacological approaches. We talked about maintaining a healthy lifestyle in general. I encouraged the patient to eat healthy, exercise daily and keep well hydrated, to keep a scheduled bedtime and wake time routine, to not skip any meals and eat healthy snacks in between meals and to have protein with every meal.   As far as medications are concerned, I recommended the following at this time: no change.  I answered all his questions today and the patient was in agreement with the above outlined plan. I would like to see the patient back in 6 months, sooner if the need arises and encouraged him to call with any interim questions, concerns, problems or updates and refill requests.

## 2012-06-05 NOTE — Patient Instructions (Addendum)
I think you're doing well at this time and I do not suggest any medication changes. Please follow up in 6 months from now, call us with any questions, concerns, problems or updates or refill requests. Are as and sections are

## 2012-06-18 ENCOUNTER — Other Ambulatory Visit: Payer: Self-pay

## 2012-06-18 MED ORDER — FESOTERODINE FUMARATE ER 4 MG PO TB24: 4.0000 mg | ORAL_TABLET | Freq: Every day | ORAL | Status: DC

## 2012-07-02 ENCOUNTER — Telehealth: Payer: Self-pay

## 2012-07-02 MED ORDER — RASAGILINE MESYLATE 1 MG PO TABS: 1.0000 mg | ORAL_TABLET | Freq: Every day | ORAL | Status: DC

## 2012-07-02 MED ORDER — CARBIDOPA-LEVODOPA 25-100 MG PO TABS: 1.0000 | ORAL_TABLET | Freq: Three times a day (TID) | ORAL | Status: DC

## 2012-07-02 NOTE — Telephone Encounter (Signed)
Message copied by Malachy Moan on Tue Jul 02, 2012 12:35 PM ------      Message from: Warren Lacy A      Created: Tue Jul 02, 2012 10:34 AM      Contact: Sandi Raveling and said that he need two refills, but I tried to understand the medication but I couldn't understand what he was saying. Sorry      His number is [phone]      Pharmacy is [facility] ------

## 2012-07-02 NOTE — Telephone Encounter (Signed)
Former [facility] patient assigned to Dr Athar.  

## 2012-07-23 ENCOUNTER — Ambulatory Visit (INDEPENDENT_AMBULATORY_CARE_PROVIDER_SITE_OTHER): Payer: Medicare Other | Admitting: Internal Medicine

## 2012-07-23 VITALS — BP 114/66 | HR 70 | Temp 98.6°F | Resp 18 | Ht 68.0 in | Wt 126.0 lb

## 2012-07-23 DIAGNOSIS — J029 Acute pharyngitis, unspecified: Secondary | ICD-10-CM | POA: Diagnosis not present

## 2012-07-23 DIAGNOSIS — R05 Cough: Secondary | ICD-10-CM | POA: Diagnosis not present

## 2012-07-23 DIAGNOSIS — R509 Fever, unspecified: Secondary | ICD-10-CM | POA: Diagnosis not present

## 2012-07-23 DIAGNOSIS — R51 Headache: Secondary | ICD-10-CM | POA: Diagnosis not present

## 2012-07-23 LAB — POCT CBC
Granulocyte percent: 72.3 %G (ref 37–80)
Hemoglobin: 11.7 g/dL — AB (ref 14.1–18.1)
MCH, POC: 29.8 pg (ref 27–31.2)
MCV: 96.9 fL (ref 80–97)
MPV: 9.3 fL (ref 0–99.8)
RBC: 3.93 M/uL — AB (ref 4.69–6.13)
WBC: 4.6 10*3/uL (ref 4.6–10.2)

## 2012-07-23 LAB — POCT INFLUENZA A/B: Influenza A, POC: NEGATIVE

## 2012-07-23 MED ORDER — HYDROCODONE-ACETAMINOPHEN 7.5-325 MG/15ML PO SOLN: 5.0000 mL | Freq: Four times a day (QID) | ORAL | Status: DC | PRN

## 2012-07-23 MED ORDER — AZITHROMYCIN 500 MG PO TABS: 500.0000 mg | ORAL_TABLET | Freq: Every day | ORAL | Status: DC

## 2012-07-23 NOTE — Patient Instructions (Signed)
Viral and Bacterial Pharyngitis  Pharyngitis is soreness (inflammation) or infection of the pharynx. It is also called a sore throat.  CAUSES   Most sore throats are caused by viruses and are part of a cold. However, some sore throats are caused by strep and other bacteria. Sore throats can also be caused by post nasal drip from draining sinuses, allergies and sometimes from sleeping with an open mouth. Infectious sore throats can be spread from person to person by coughing, sneezing and sharing cups or eating utensils.  TREATMENT   Sore throats that are viral usually last 3-4 days. Viral illness will get better without medications (antibiotics). Strep throat and other bacterial infections will usually begin to get better about 24-48 hours after you begin to take antibiotics.  HOME CARE INSTRUCTIONS    If the caregiver feels there is a bacterial infection or if there is a positive strep test, they will prescribe an antibiotic. The full course of antibiotics must be taken. If the full course of antibiotic is not taken, you or your child may become ill again. If you or your child has strep throat and do not finish all of the medication, serious heart or kidney diseases may develop.   Drink enough water and fluids to keep your urine clear or pale yellow.   Only take over-the-counter or prescription medicines for pain, discomfort or fever as directed by your caregiver.   Get lots of rest.   Gargle with salt water ( tsp. of salt in a glass of water) as often as every 1-2 hours as you need for comfort.   Hard candies may soothe the throat if individual is not at risk for choking. Throat sprays or lozenges may also be used.  SEEK MEDICAL CARE IF:    Large, tender lumps in the neck develop.   A rash develops.   Green, yellow-brown or bloody sputum is coughed up.   Your baby is older than 3 months with a rectal temperature of 100.5 F (38.1 C) or higher for more than 1 day.  SEEK IMMEDIATE MEDICAL CARE IF:    A  stiff neck develops.   You or your child are drooling or unable to swallow liquids.   You or your child are vomiting, unable to keep medications or liquids down.   You or your child has severe pain, unrelieved with recommended medications.   You or your child are having difficulty breathing (not due to stuffy nose).   You or your child are unable to fully open your mouth.   You or your child develop redness, swelling, or severe pain anywhere on the neck.   You have a fever.   Your baby is older than 3 months with a rectal temperature of 102 F (38.9 C) or higher.   Your baby is 3 months old or younger with a rectal temperature of 100.4 F (38 C) or higher.  MAKE SURE YOU:    Understand these instructions.   Will watch your condition.   Will get help right away if you are not doing well or get worse.  Document Released: 02/06/2005 Document Revised: 05/01/2011 Document Reviewed: 05/06/2007  [employer] Patient Information 2014 [employer]  Sinusitis  Sinusitis is redness, soreness, and swelling (inflammation) of the paranasal sinuses. Paranasal sinuses are air pockets within the bones of your face (beneath the eyes, the middle of the forehead, or above the eyes). In healthy paranasal sinuses, mucus is able to drain out, and air is able to   circulate through them by way of your nose. However, when your paranasal sinuses are inflamed, mucus and air can become trapped. This can allow bacteria and other germs to grow and cause infection.  Sinusitis can develop quickly and last only a short time (acute) or continue over a long period (chronic). Sinusitis that lasts for more than 12 weeks is considered chronic.   CAUSES   Causes of sinusitis include:   Allergies.   Structural abnormalities, such as displacement of the cartilage that separates your nostrils (deviated septum), which can decrease the air flow through your nose and sinuses and affect sinus drainage.   Functional abnormalities, such as when the  small hairs (cilia) that line your sinuses and help remove mucus do not work properly or are not present.  SYMPTOMS   Symptoms of acute and chronic sinusitis are the same. The primary symptoms are pain and pressure around the affected sinuses. Other symptoms include:   Upper toothache.   Earache.   Headache.   Bad breath.   Decreased sense of smell and taste.   A cough, which worsens when you are lying flat.   Fatigue.   Fever.   Thick drainage from your nose, which often is green and may contain pus (purulent).   Swelling and warmth over the affected sinuses.  DIAGNOSIS   Your caregiver will perform a physical exam. During the exam, your caregiver may:   Look in your nose for signs of abnormal growths in your nostrils (nasal polyps).   Tap over the affected sinus to check for signs of infection.   View the inside of your sinuses (endoscopy) with a special imaging device with a light attached (endoscope), which is inserted into your sinuses.  If your caregiver suspects that you have chronic sinusitis, one or more of the following tests may be recommended:   Allergy tests.   Nasal culture A sample of mucus is taken from your nose and sent to a lab and screened for bacteria.   Nasal cytology A sample of mucus is taken from your nose and examined by your caregiver to determine if your sinusitis is related to an allergy.  TREATMENT   Most cases of acute sinusitis are related to a viral infection and will resolve on their own within 10 days. Sometimes medicines are prescribed to help relieve symptoms (pain medicine, decongestants, nasal steroid sprays, or saline sprays).   However, for sinusitis related to a bacterial infection, your caregiver will prescribe antibiotic medicines. These are medicines that will help kill the bacteria causing the infection.   Rarely, sinusitis is caused by a fungal infection. In theses cases, your caregiver will prescribe antifungal medicine.  For some cases of chronic  sinusitis, surgery is needed. Generally, these are cases in which sinusitis recurs more than 3 times per year, despite other treatments.  HOME CARE INSTRUCTIONS    Drink plenty of water. Water helps thin the mucus so your sinuses can drain more easily.   Use a humidifier.   Inhale steam 3 to 4 times a day (for example, sit in the bathroom with the shower running).   Apply a warm, moist washcloth to your face 3 to 4 times a day, or as directed by your caregiver.   Use saline nasal sprays to help moisten and clean your sinuses.   Take over-the-counter or prescription medicines for pain, discomfort, or fever only as directed by your caregiver.  SEEK IMMEDIATE MEDICAL CARE IF:   You have increasing pain or

## 2012-07-23 NOTE — Progress Notes (Signed)
  Subjective:    Patient ID: [name], male    DOB: 1945-09-15, 67 y.o.   MRN: [id number]  HPI Pt has sore throat and dry cough x 2 days now. He has been around sick contacts. One of his grandkids has a really bad cold. He is not short of breath and denies any chest pain.  Has fatigue and sinus pain. Is allergic to penicillin. No urinary sxs.  Review of Systems See list  Parkinsons    Objective:   Physical Exam  Constitutional: He is oriented to person, place, and time. He appears well-developed and well-nourished. He is cooperative. He appears ill. No distress.  HENT:  Right Ear: External ear normal.  Left Ear: External ear normal.  Nose: Mucosal edema, rhinorrhea and sinus tenderness present. Right sinus exhibits maxillary sinus tenderness and frontal sinus tenderness. Left sinus exhibits maxillary sinus tenderness and frontal sinus tenderness.  Mouth/Throat: Oropharyngeal exudate present.  Neck: Normal range of motion. Neck supple.  Cardiovascular: Normal rate, regular rhythm and normal heart sounds.   Pulmonary/Chest: Effort normal and breath sounds normal. He has no rales.  Lymphadenopathy:    He has no cervical adenopathy.  Neurological: He is alert and oriented to person, place, and time. No cranial nerve deficit. He exhibits normal muscle tone. Coordination normal.  Skin: Skin is warm.  Psychiatric: He has a normal mood and affect.   Results for orders placed in visit on 07/23/12  POCT CBC      Result Value Range   WBC 4.6  4.6 - 10.2 K/uL   Lymph, poc 0.9  0.6 - 3.4   POC LYMPH PERCENT 19.7  10 - 50 %L   MID (cbc) 0.4  0 - 0.9   POC MID % 8.0  0 - 12 %M   POC Granulocyte 3.3  2 - 6.9   Granulocyte percent 72.3  37 - 80 %G   RBC 3.93 (*) 4.69 - 6.13 M/uL   Hemoglobin 11.7 (*) 14.1 - 18.1 g/dL   HCT, POC 21.3 (*) 08.6 - 53.7 %   MCV 96.9  80 - 97 fL   MCH, POC 29.8  27 - 31.2 pg   MCHC 30.7 (*) 31.8 - 35.4 g/dL   RDW, POC 57.8     Platelet Count, POC 141 (*)  142 - 424 K/uL   MPV 9.3  0 - 99.8 fL  POCT RAPID STREP A (OFFICE)      Result Value Range   Rapid Strep A Screen Negative  Negative  POCT INFLUENZA A/B      Result Value Range   Influenza A, POC Negative     Influenza B, POC Negative            Assessment & Plan:  Sinusitis/Bronchitis/Pharyngitis Zithromax 500mg Leandro Reasoner

## 2012-07-25 LAB — CULTURE, GROUP A STREP: Organism ID, Bacteria: NORMAL

## 2012-10-31 ENCOUNTER — Ambulatory Visit (INDEPENDENT_AMBULATORY_CARE_PROVIDER_SITE_OTHER): Payer: Medicare Other | Admitting: Family Medicine

## 2012-10-31 ENCOUNTER — Ambulatory Visit
Admission: RE | Admit: 2012-10-31 | Discharge: 2012-10-31 | Disposition: A | Discharge status: Home / Self Care | Payer: Medicare Other | Source: Ambulatory Visit | Attending: Family Medicine | Admitting: Family Medicine

## 2012-10-31 VITALS — BP 119/72 | HR 61 | Temp 98.0°F | Resp 18 | Ht 68.0 in | Wt 128.0 lb

## 2012-10-31 DIAGNOSIS — E039 Hypothyroidism, unspecified: Secondary | ICD-10-CM

## 2012-10-31 DIAGNOSIS — R55 Syncope and collapse: Secondary | ICD-10-CM

## 2012-10-31 DIAGNOSIS — Z1389 Encounter for screening for other disorder: Secondary | ICD-10-CM | POA: Diagnosis not present

## 2012-10-31 DIAGNOSIS — R42 Dizziness and giddiness: Secondary | ICD-10-CM

## 2012-10-31 LAB — POCT CBC
HCT, POC: 42.8 % — AB (ref 43.5–53.7)
Lymph, poc: 1.4 (ref 0.6–3.4)
MCHC: 31.5 g/dL — AB (ref 31.8–35.4)
MCV: 95.6 fL (ref 80–97)
MID (cbc): 0.4 (ref 0–0.9)
POC Granulocyte: 3.2 (ref 2–6.9)
POC LYMPH PERCENT: 27.9 %L (ref 10–50)
RDW, POC: 14.5 %

## 2012-10-31 LAB — COMPREHENSIVE METABOLIC PANEL
Alkaline Phosphatase: 83 U/L (ref 39–117)
BUN: 24 mg/dL — ABNORMAL HIGH (ref 6–23)
Glucose, Bld: 92 mg/dL (ref 70–99)
Sodium: 138 mEq/L (ref 135–145)
Total Bilirubin: 0.4 mg/dL (ref 0.3–1.2)

## 2012-10-31 LAB — POCT URINALYSIS DIPSTICK
Glucose, UA: NEGATIVE
Ketones, UA: NEGATIVE
Spec Grav, UA: 1.02

## 2012-10-31 LAB — POCT UA - MICROSCOPIC ONLY
Epithelial cells, urine per micros: NEGATIVE
Mucus, UA: NEGATIVE
RBC, urine, microscopic: NEGATIVE

## 2012-10-31 MED ORDER — IOHEXOL 300 MG/ML  SOLN
75.0000 mL | Freq: Once | INTRAMUSCULAR | Status: AC | PRN
  Administered 2012-10-31: 75 mL via INTRAVENOUS

## 2012-10-31 NOTE — Progress Notes (Signed)
[address]   [place], [place]  [address]   [phone]  Subjective:    Patient ID: [name], male    DOB: [birth year], 67 y.o.   MRN: [id number]  HPI This 67 y.o. male presents for evaluation of loss of consciousness. Pt is regular patient of Dr. Ernestene Mention.   For the past two weeks pt reports that he has not been feeling well other than dizziness, ear congestion. He has been feeling dizzy with movement and change of position. Patient reports Nausea at times.   He was in the yard yesterday picking up his yard. He was out in his yard for several hours. He states he became dizzy and blacked out. He was bending up and down prior. He states he was out for about unknown minutes.  Patient states that he was just unable to get up off the ground afterwards due to weakness, dizziness. Pt did have breakfast (cereal, 2 pop tarts, coffee, juice) and was not dehydrated.   Denies numbness or tingling, muscle weakness, CP, SOB, palpitations, vision changes or diplopia.  Denies urinary or bowel incontinence.  +slight confusion for a while after syncopal event.  Denies focal weakness.  Was alone in yard; syncopal event was not witnessed.  No previous syncope in the past. No recent changes of medications; current medication regimen has been chronic.  Pt has no cardiac history, denies prior syncope.  Denies seizure history.   Today pt complains of just ear pain and congestion bilaterally. He has been having issues with his ears feeling full. He has had this problem for years and has gone to an ENT twelve years ago. They were unable to find any reason for the fullness.  Worried that he may have Meniere's disease but denies ringing in ears.  No dizziness with turning head; +dizziness with changes in position.  No weight loss despite nausea. Weight has been stable.  Denies dysuria; has suffered with intermittent headaches but chronic issue. No abdominal pain, diarrhea.   Married.  Lives with wife.  Review of  Systems  Constitutional: Positive for fatigue. Negative for fever, chills, diaphoresis and unexpected weight change.  HENT: Positive for hearing loss, ear pain and tinnitus. Negative for congestion, rhinorrhea, sneezing, trouble swallowing, neck pain, neck stiffness, postnasal drip and sinus pressure.   Eyes: Negative for visual disturbance.  Respiratory: Negative for chest tightness and shortness of breath.   Cardiovascular: Negative for chest pain, palpitations and leg swelling.  Gastrointestinal: Positive for nausea. Negative for vomiting, abdominal pain, diarrhea, constipation, blood in stool, anal bleeding and rectal pain.  Genitourinary: Negative for dysuria, frequency and difficulty urinating.  Skin: Negative for rash.  Neurological: Positive for dizziness, syncope, light-headedness and headaches. Negative for tremors, seizures, facial asymmetry, speech difficulty, weakness and numbness.  Psychiatric/Behavioral: Negative for confusion.    Past Medical History  Diagnosis Date  . Thyroid disease   . Allergy   . Depression   . Parkinson's disease   . Anemia   . Parkinson's disease 06/05/2012    Past Surgical History  Procedure Laterality Date  . Mandible fracture surgery    . Hernia repair    . Wrist fracture surgery    . Rotator cuff repair    . Tonsillectomy    . Fracture surgery      jaw  . Vasectomy    . Appendectomy      Prior to Admission medications   Medication Sig Start Date End Date Taking? Authorizing Provider  aspirin  81 MG tablet Take 160 mg by mouth daily.   Yes Historical Provider, MD  azithromycin (ZITHROMAX) 500 MG tablet Take 1 tablet (500 mg total) by mouth daily. 07/23/12  Yes Jonita Albee, MD  benzonatate (TESSALON) 100 MG capsule  04/27/12  Yes Historical Provider, MD  carbidopa-levodopa (SINEMET IR) 25-100 MG per tablet Take 1 tablet by mouth 3 (three) times daily. 07/02/12  Yes Huston Foley, MD  fesoterodine (TOVIAZ) 4 MG TB24 Take 1 tablet (4 mg total)  by mouth daily. 06/18/12  Yes Huston Foley, MD  fluticasone (FLONASE) 50 MCG/ACT nasal spray Place 2 sprays into the nose daily. 04/27/12 04/27/13 Yes Peyton Najjar, MD  gentamicin (GARAMYCIN) 0.3 % ophthalmic solution Place 1 drop into both eyes every 4 (four) hours. 04/30/12  Yes Tonye Pearson, MD  HYDROcodone-acetaminophen (HYCET) 7.5-325 mg/15 ml solution Take 5 mLs by mouth every 6 (six) hours as needed for pain (or cough). 07/23/12  Yes Jonita Albee, MD  HYDROcodone-homatropine [facility]) 5-1.5 MG/5ML syrup Take 5 mLs by mouth every 6 (six) hours as needed for cough. 04/30/12  Yes Tonye Pearson, MD  levothyroxine (SYNTHROID, LEVOTHROID) 50 MCG tablet TAKE 1 TABLET BY MOUTH EVERY OTHER DAY 02/19/12  Yes Heather M Marte, PA-C  levothyroxine (SYNTHROID, LEVOTHROID) 75 MCG tablet TAKE ONE TABLET BY MOUTH every other day. 04/09/12  Yes Shade Flood, MD  Multiple Vitamins-Minerals (MULTIVITAMIN WITH MINERALS) tablet Take 1 tablet by mouth daily.   Yes Historical Provider, MD  rasagiline (AZILECT) 1 MG TABS Take 1 tablet (1 mg total) by mouth daily. 07/02/12  Yes Huston Foley, MD  rOPINIRole (REQUIP) 2 MG tablet TAKE 1 TABLET BY MOUTH AT BEDTIME 02/05/12  Yes Nelva Nay, PA-C    Allergies  Allergen Reactions  . Ibuprofen Other (See Comments)    Blood in stool.  . Lamictal [Lamotrigine] Other (See Comments)    Blisters in mouth  . Nsaids   . Penicillins     History   Social History  . Marital Status: Married    Spouse Name: [name]    Number of Children: 2  . Years of Education: 12+   Occupational History  . DISABILITY     due to Parkinson's   Social History Main Topics  . Smoking status: Never Smoker   . Smokeless tobacco: Never Used  . Alcohol Use: 0 - 4.2 oz/week    0-7 Cans of beer per week  . Drug Use: No  . Sexual Activity: No   Other Topics Concern  . Not on file   Social History Narrative  . No narrative on file    Family History  Problem Relation Age of  Onset  . Cancer Father     colon       Objective:   Physical Exam  Nursing note and vitals reviewed. Constitutional: He is oriented to person, place, and time. He appears well-developed. No distress.  HENT:  Head: Normocephalic and atraumatic.  Right Ear: External ear normal.  Left Ear: External ear normal.  Nose: Nose normal.  Mouth/Throat: Oropharynx is clear and moist.  Eyes: Conjunctivae and EOM are normal. Pupils are equal, round, and reactive to light.  Neck: Normal range of motion. Neck supple. No JVD present. Carotid bruit is not present. No thyromegaly present.  Cardiovascular: Normal rate, regular rhythm, normal heart sounds and intact distal pulses.  Exam reveals no gallop and no friction rub.   No murmur heard. Pulmonary/Chest: Effort normal and breath  sounds normal. He has no wheezes. He has no rales.  Abdominal: Soft. Bowel sounds are normal. He exhibits no distension and no mass. There is no tenderness. There is no rebound and no guarding.  Lymphadenopathy:    He has no cervical adenopathy.  Neurological: He is alert and oriented to person, place, and time. He has normal reflexes. No cranial nerve deficit. He exhibits normal muscle tone. He displays a negative Romberg sign. Coordination normal.  Skin: Skin is warm and dry. No rash noted. He is not diaphoretic.  Psychiatric: He has a normal mood and affect. His behavior is normal. Judgment and thought content normal.   EKG:  NSR; no arrhythmia; no ST changes.  Results for orders placed in visit on 10/31/12  POCT CBC      Result Value Range   WBC 4.9  4.6 - 10.2 K/uL   Lymph, poc 1.4  0.6 - 3.4   POC LYMPH PERCENT 27.9  10 - 50 %L   MID (cbc) 0.4  0 - 0.9   POC MID % 7.7  0 - 12 %M   POC Granulocyte 3.2  2 - 6.9   Granulocyte percent 64.4  37 - 80 %G   RBC 4.48 (*) 4.69 - 6.13 M/uL   Hemoglobin 13.5 (*) 14.1 - 18.1 g/dL   HCT, POC 16.1 (*) 09.6 - 53.7 %   MCV 95.6  80 - 97 fL   MCH, POC 30.1  27 - 31.2 pg    MCHC 31.5 (*) 31.8 - 35.4 g/dL   RDW, POC 04.5     Platelet Count, POC 178  142 - 424 K/uL   MPV 9.7  0 - 99.8 fL  POCT URINALYSIS DIPSTICK      Result Value Range   Color, UA yellow     Clarity, UA clear     Glucose, UA neg     Bilirubin, UA neg     Ketones, UA neg     Spec Grav, UA 1.020     Blood, UA neg     pH, UA 5.5     Protein, UA neg     Urobilinogen, UA 0.2     Nitrite, UA neg     Leukocytes, UA Negative    POCT UA - MICROSCOPIC ONLY      Result Value Range   WBC, Ur, HPF, POC 0-1     RBC, urine, microscopic neg     Bacteria, U Microscopic trace     Mucus, UA neg     Epithelial cells, urine per micros neg     Crystals, Ur, HPF, POC neg     Casts, Ur, LPF, POC neg     Yeast, UA neg         Assessment & Plan:  Syncope - Plan: EKG 12-Lead, POCT CBC, Comprehensive metabolic panel, POCT urinalysis dipstick, POCT UA - Microscopic Only, CK, CK total and CKMB, Troponin I, POCT glucose (manual entry), CT Head W Wo Contrast, CANCELED: CT Head W Contrast  Dizziness and giddiness - Plan: EKG 12-Lead, POCT CBC, Comprehensive metabolic panel, POCT urinalysis dipstick, POCT UA - Microscopic Only, CK, CK total and CKMB, Troponin I, POCT glucose (manual entry), CT Head W Wo Contrast  Unspecified hypothyroidism - Plan: TSH  Screening for nephropathy - Plan: BUN, Creatinine, serum   1. Syncope:  New. Duration unknown.  Orthostatics borderline abnormal thus orthostatic hypotension may be etiology yet no known reason to be dehydrated.  Obtain STAT CT  head to evaluate for acute CVA; refer to neurology to rule out seizure, neurological process; possibly post-ictal state following syncopal event.  Refer to cardiology to rule out cardiac pathology. Obtain labs.  Normal neurological exam in office.  EKG stable.  To avoid being alone until work up completed; call 911 for recurrent syncopal event. 2.  Dizziness:  New; Orthostatics borderline; recommend increasing fluid intake over the next  several days.  Obtain labs.  Normal neurological exam in office.  3.  Hypothyroidism: stable; obtain labs.

## 2012-10-31 NOTE — Patient Instructions (Signed)
You can go for your CT scan today. You will  have your labs drawn to check your kidneys first. Then you will have the scan. It will be done at [facility]. You should arrive at 5pm for the scan.  [facility] is located at [employer] you can not have any solid food for 4 hours prior to the scan, you may have liquids  Driving directions to [address] [place], [place], [place] [address] [id number]  - more info    [address]  [place], [place] [address]     1. Head south on [address] toward [address]      0.5 mi    2. Sharp left onto [address]      0.6 mi    3. Turn left onto the [address] ramp      0.2 mi    4. Merge onto [address]      3.0 mi    5. Continue straight to stay on [address]      0.4 mi    6. Slight left to stay on [facility]  Destination will be on the right     1.0 mi     [address] [place], [place] [address]

## 2012-11-01 LAB — CK TOTAL AND CKMB (NOT AT ARMC): Relative Index: 5.1 — ABNORMAL HIGH (ref 0.0–2.5)

## 2012-11-01 LAB — TROPONIN I: Troponin I: 0.02 ng/mL (ref <0.06)

## 2012-11-04 ENCOUNTER — Encounter: Payer: Self-pay | Admitting: Internal Medicine

## 2012-11-07 ENCOUNTER — Telehealth: Payer: Self-pay

## 2012-11-07 ENCOUNTER — Encounter: Payer: Self-pay | Admitting: Family Medicine

## 2012-11-07 NOTE — Telephone Encounter (Signed)
Patient advised of normal results he states he is improved, has had no other syncopal episodes. He is encouraged to rest/ increase fluid intake, and to come in if any symptoms recur, or go to ER. He has not heard back from the cardiology appt, I have provided him the number so he can call for the appt. Shoaib Siefker To you FYI

## 2012-11-07 NOTE — Telephone Encounter (Signed)
PATIENT STATES THAT DR. Katrinka Blazing ORDERED A CAT SCAN OF HIS BRAIN ON LAST Thursday AND HE HAS NOT HEARD ANYTHING BACK ON THE RESULTS YET. BEST PHONE [phone] (HOME) HE SAID IF HE DOES NOT ANSWER PLEASE LEAVE HIM A MESSAGE.  PHARMACY CHOICE IS [facility] ON [address]   MBC

## 2012-11-07 NOTE — Telephone Encounter (Signed)
1. No acute intracranial abnormalities. Normal brain. Called patient left message for him to call me back.

## 2012-11-07 NOTE — Telephone Encounter (Signed)
Also spoke with patient; feeling well; no recurrent syncope; reviewed results in detail; highly recommend cardiology consultation; patient feeling better and does not see need in cardiology consultation but agreeable to referral at recommendation.

## 2012-11-11 ENCOUNTER — Ambulatory Visit (INDEPENDENT_AMBULATORY_CARE_PROVIDER_SITE_OTHER): Payer: Medicare Other | Admitting: Internal Medicine

## 2012-11-11 ENCOUNTER — Encounter: Payer: Self-pay | Admitting: Internal Medicine

## 2012-11-11 VITALS — BP 106/70 | HR 63 | Ht 69.0 in | Wt 131.9 lb

## 2012-11-11 DIAGNOSIS — G20 Parkinson's disease: Secondary | ICD-10-CM | POA: Diagnosis not present

## 2012-11-11 DIAGNOSIS — I951 Orthostatic hypotension: Secondary | ICD-10-CM | POA: Insufficient documentation

## 2012-11-11 DIAGNOSIS — R55 Syncope and collapse: Secondary | ICD-10-CM | POA: Diagnosis not present

## 2012-11-11 NOTE — Progress Notes (Signed)
OFFICE NOTE  Chief Complaint:  Syncope  Primary Care Physician: Tally Due, MD  HPI:  [name] this pleasant 67 year old male with unfortunate history of Parkinson's disease. He has no history of cardiac problems and no family history of significant heart disease. He is a lifetime nonsmoker and used no alcohol. He has no history of diabetes but does have a history of hypothyroidism. Recently he's noted that his blood pressure is running lower than normal. He had an episode a few weeks ago where he was bending over in his yard for long periods of time and picking up sticks and leaves. He noted that he been lightheaded and/or dizzy and felt an episode like he was going to fall out. Subsequently he did fall over and denies a lack of recognition of that event. He says that he was somewhat confused but realized he was on the ground and was too weak to get up right away. He eventually was able to get up on its on power after about 3 or 4 minutes. He's not had any episodes since then. The weather was quite hot that day and he had been working outside for periods of time. He is not currently on any blood pressure medications. He denied any chest pain or palpitations around the time of the event. He's had no further episodes since that event.  PMHx:  Past Medical History  Diagnosis Date  . Thyroid disease   . Allergy   . Depression   . Parkinson's disease   . Anemia   . Parkinson's disease 06/05/2012    Past Surgical History  Procedure Laterality Date  . Mandible fracture surgery    . Hernia repair    . Wrist fracture surgery    . Rotator cuff repair    . Tonsillectomy    . Fracture surgery      jaw  . Vasectomy    . Appendectomy      FAMHx:  Family History  Problem Relation Age of Onset  . Cancer Father     colon    SOCHx:   reports that he has never smoked. He has never used smokeless tobacco. He reports that  drinks alcohol. He reports that he does not use  illicit drugs.  ALLERGIES:  Allergies  Allergen Reactions  . Ibuprofen Other (See Comments)    Blood in stool.  . Lamictal [Lamotrigine] Other (See Comments)    Blisters in mouth  . Nsaids   . Penicillins     ROS: A comprehensive review of systems was negative except for: Neurological: positive for dizziness and syncope  HOME MEDS: Current Outpatient Prescriptions  Medication Sig Dispense Refill  . aspirin 81 MG tablet Take 160 mg by mouth daily.      . Biotin (BIOTIN 5000) 5 MG CAPS Take 1 capsule by mouth daily.      . carbidopa-levodopa (SINEMET IR) 25-100 MG per tablet Take 1 tablet by mouth 3 (three) times daily.  90 tablet  5  . ferrous sulfate 325 (65 FE) MG tablet Take 325 mg by mouth daily with breakfast.      . fesoterodine (TOVIAZ) 4 MG TB24 Take 1 tablet (4 mg total) by mouth daily.  30 tablet  6  . fish oil-omega-3 fatty acids 1000 MG capsule Take 1 g by mouth daily.      Marland Kitchen levothyroxine (SYNTHROID, LEVOTHROID) 50 MCG tablet TAKE 1 TABLET BY MOUTH EVERY OTHER DAY  30 tablet  5  .  levothyroxine (SYNTHROID, LEVOTHROID) 75 MCG tablet TAKE ONE TABLET BY MOUTH every other day.  30 tablet  5  . Multiple Vitamins-Minerals (MULTIVITAMIN WITH MINERALS) tablet Take 1 tablet by mouth daily.      . rasagiline (AZILECT) 1 MG TABS Take 1 tablet (1 mg total) by mouth daily.  30 tablet  5  . rOPINIRole (REQUIP) 2 MG tablet        No current facility-administered medications for this visit.    LABS/IMAGING: No results found for this or any previous visit (from the past 48 hour(s)). No results found.  VITALS: BP 106/70  Pulse 63  Ht 5\' 9"  (1.753 m)  Wt 131 lb 14.4 oz (59.829 kg)  BMI 19.47 kg/m2  EXAM: General appearance: alert and no distress Neck: no adenopathy, no carotid bruit, no JVD, supple, symmetrical, trachea midline and thyroid not enlarged, symmetric, no tenderness/mass/nodules Lungs: clear to auscultation bilaterally Heart: regular rate and rhythm, S1, S2  normal, no murmur, click, rub or gallop Abdomen: soft, non-tender; bowel sounds normal; no masses,  no organomegaly and scaphoid Extremities: extremities normal, atraumatic, no cyanosis or edema Pulses: 2+ and symmetric Skin: Skin color, texture, turgor normal. No rashes or lesions Neurologic: Mental status: Alert, oriented, thought content appropriate, minimal tremor, notable bulbar symptoms with speech Psych: Mood, affect normal - somewhat masked facies  EKG: Normal sinus rhythm, incomplete right bundle pattern, high-voltage at 63  ASSESSMENT: 1. Autonomic postural hypotension 2. Syncope 3. Parkinson's disease with likely dysautonomia  PLAN: 1.   I suspect [name] has autonomic postural hypotension. The event was likely preceded by a long period of time for which he was bending over picking up his use which led to a vasovagal syncopal event. It is most likely that his heart rate reduced with a concomitant vasodilatation that led to poor cerebral perfusion and his syncopal event. This is not uncommon in patient's with Parkinson's disease and often times necessitate stopping blood pressure medications. Unfortunately, he is not on any blood pressure medications and is rather thin individual with a low normal blood pressure at baseline. In this instance, if he has further episodes he may benefit from starting low dose alpha agonist such as Midodrine. He could also benefit from wearing bilateral thigh-high 20-30 mm compression stockings. Other than these interventions, there is little else to treat these types of episodes. I did talk to him about trying to squat down on his legs when he goes to pick things up and change position very slowly to reduce his orthostasis. At this time he does not wish to start new medications, but the above options may be helpful for him if he has recurrence. His cardiovascular exam is benign, I would not recommend any further workup such as an echocardiogram, monitor  or stress testing based on the description of his syncopal event and the relative unlikelihood of this of this being primarily cardiac.  Thank you for referring [name]. He can follow up with me as needed if he were to have further symptoms or he can implement the suggestions above.  Chrystie Nose, MD, [facility] Attending Cardiologist [facility]  Prosperity Darrough C 11/11/2012, 10:04 AM

## 2012-11-11 NOTE — Patient Instructions (Addendum)
Your physician recommends that you schedule a follow-up appointment as needed  

## 2012-11-12 ENCOUNTER — Encounter: Payer: Self-pay | Admitting: Neurology

## 2012-11-12 ENCOUNTER — Ambulatory Visit (INDEPENDENT_AMBULATORY_CARE_PROVIDER_SITE_OTHER): Payer: Medicare Other | Admitting: Neurology

## 2012-11-12 VITALS — BP 118/75 | HR 70 | Temp 97.7°F | Ht 68.0 in | Wt 125.0 lb

## 2012-11-12 DIAGNOSIS — R55 Syncope and collapse: Secondary | ICD-10-CM | POA: Diagnosis not present

## 2012-11-12 DIAGNOSIS — F329 Major depressive disorder, single episode, unspecified: Secondary | ICD-10-CM | POA: Diagnosis not present

## 2012-11-12 DIAGNOSIS — G901 Familial dysautonomia [Riley-Day]: Secondary | ICD-10-CM

## 2012-11-12 DIAGNOSIS — G2581 Restless legs syndrome: Secondary | ICD-10-CM | POA: Diagnosis not present

## 2012-11-12 DIAGNOSIS — G909 Disorder of the autonomic nervous system, unspecified: Secondary | ICD-10-CM

## 2012-11-12 DIAGNOSIS — G20 Parkinson's disease: Secondary | ICD-10-CM

## 2012-11-12 NOTE — Patient Instructions (Signed)
You are at risk for blackout spells because of the her Parkinson's disease which can impair your autonomic regulation and blood pressure control especially when changing positions. Therefore, change positions very slowly and avoid heat exposure and prolonged bending. When you work in your yard tried to squat and then stand up slowly. Do not work more than half an hour at a time and take breaks for snacks and hydration. Keep yourself generally speaking very well hydrated with water. See if you can get a gardening stool that can help as well. I am not making any changes in your medications at this time. If blood pressure drops become a recurrent problem we can think about starting you on a medication that helps keep the blood pressure elevated. These medications can have side effects so I am going to avoid using them now.

## 2012-11-12 NOTE — Progress Notes (Signed)
Subjective:    Patient ID: [name] is a 67 y.o. male.  HPI  Interim history:   [name] is a very pleasant 67 year old right-handed gentleman with an underlying medical history of hypothyroidism, depression, Parkinson's disease, anemia, and allergies, who has had a recent event of loss of consciousness on 10/30/12. He reported that he had not been feeling well for the past 2 weeks prior to his syncope. He had been feeling dizzy especially with change of position with intermittent nausea reported. He had been in his yard working for several hours and became dizzy and had a blackout spell. He fell into the bushes, and and came to after some seconds. He was bending up and down and after he regained consciousness, he was feeling weak and eventually was able to go to the porch, where he sat for about 45 minutes. His wife was at work. He had some slight confusion and lack of energy, but denies any focal weakness or chest pain or palpitations or shortness of breath. He did not have any prior syncopal spells. He had blood work at the [facility] and saw Dr. Katrinka Blazing on 10/31/2012. His BUN was borderline at 24, he had a CTH on 10/31/12, which was negative and yesterday went for cardiological consultation with Dr. Rennis Golden, who felt, that he had a vasovagal event in the context of dysautonomia with PD. I first met him on 06/05/12, at which time I felt his Parkinson's disease was stable and I did not make any medication changes. He previously followed with Dr. Fayrene Fearing love and was last seen by him on 02/05/2012, at which time he was complaining of right hip pain. No medication changes were done at the time. He is currently on Synthroid, Sinemet 25/100 mg strength one tablet 3 times a day, Requip 2 mg strength half a pill at 6:30, half a pill at noon, half a pill at 5 PM and one and a half a pill at 9 PM. He is on rasagiline 1 mg once daily, baby aspirin, multivitamin, Toviaz.  He has an underlying medical history of  depression, sleep apnea, restless leg syndrome, hypothyroidism, mild cognitive impairment and carpal tunnel syndrome. He denies any new issues or illness or symptoms and no new meds. He did get a steroid injection into the R hip about a month ago, which helped the pain. He worked for [employer]. He lives with his wife in a one storey at [facility]. They have 2 grown children, son and daughter, both in the area. Two MAs and 4 cousins with Huntington's and he has been tested and was negative for it. Dr. Sandria Manly arranged the genetic testing. He has occasional depressive Sx, but better when he can get out and walk, he walks about 6 miles/day. He has improved in his RLS Sx since going to 1 1/2 pills at night some 4 mo ago.  He was diagnosed with Parkinson's disease in July 2009. He was initially started on Requip, Azilect and and Sinemet. He had side effects from the Requip including excessive spending, daytime sleepiness and hypersexuality.  His Requip dose was therefore decreased. He has restless leg symptoms. He exercises regularly. He denies memory loss and has a history of depression. He has OSA but is not using CPAP currently, but he tried. He is independent in his ADLs. He has had visual hallucinations. He fell in October 2011 fracturing his left distal radius, requiring surgery in December 2011. He had right-sided leg cramps improved with  Flexeril, but he stopped the medication. He denies leg edema, or compulsive behavior. He has bladder incontinence. In December 2013 his MMSE was 29, clock drawing was poor and animal fluency was 17, geriatric depression scale was 8/15.   His Past Medical History Is Significant For: Past Medical History  Diagnosis Date  . Thyroid disease   . Allergy   . Depression   . Parkinson's disease   . Anemia   . Parkinson's disease 06/05/2012    His Past Surgical History Is Significant For: Past Surgical History  Procedure Laterality Date  . Mandible fracture  surgery    . Hernia repair    . Wrist fracture surgery    . Rotator cuff repair    . Tonsillectomy    . Fracture surgery      jaw  . Vasectomy    . Appendectomy      His Family History Is Significant For: Family History  Problem Relation Age of Onset  . Cancer Father     colon    His Social History Is Significant For: History   Social History  . Marital Status: Married    Spouse Name: [name]    Number of Children: 2  . Years of Education: 12+   Occupational History  . DISABILITY     due to Parkinson's   Social History Main Topics  . Smoking status: Never Smoker   . Smokeless tobacco: Never Used  . Alcohol Use: 0 - 4.2 oz/week    0-7 Cans of beer per week  . Drug Use: No  . Sexual Activity: No   Other Topics Concern  . None   Social History Narrative  . None    His Allergies Are:  Allergies  Allergen Reactions  . Ibuprofen Other (See Comments)    Blood in stool.  . Lamictal [Lamotrigine] Other (See Comments)    Blisters in mouth  . Nsaids   . Penicillins   :   His Current Medications Are:  Outpatient Encounter Prescriptions as of 11/12/2012  Medication Sig Dispense Refill  . aspirin 81 MG tablet Take 160 mg by mouth daily.      . Biotin (BIOTIN 5000) 5 MG CAPS Take 1 capsule by mouth daily.      . carbidopa-levodopa (SINEMET IR) 25-100 MG per tablet Take 1 tablet by mouth 3 (three) times daily.  90 tablet  5  . ferrous sulfate 325 (65 FE) MG tablet Take 325 mg by mouth daily with breakfast.      . fesoterodine (TOVIAZ) 4 MG TB24 Take 1 tablet (4 mg total) by mouth daily.  30 tablet  6  . fish oil-omega-3 fatty acids 1000 MG capsule Take 1 g by mouth daily.      Marland Kitchen levothyroxine (SYNTHROID, LEVOTHROID) 50 MCG tablet TAKE 1 TABLET BY MOUTH EVERY OTHER DAY  30 tablet  5  . levothyroxine (SYNTHROID, LEVOTHROID) 75 MCG tablet TAKE ONE TABLET BY MOUTH every other day.  30 tablet  5  . Multiple Vitamins-Minerals (MULTIVITAMIN WITH MINERALS) tablet Take 1  tablet by mouth daily.      . rasagiline (AZILECT) 1 MG TABS Take 1 tablet (1 mg total) by mouth daily.  30 tablet  5  . rOPINIRole (REQUIP) 2 MG tablet        No facility-administered encounter medications on file as of 11/12/2012.   Review of Systems  Constitutional: Positive for activity change and fatigue.  HENT: Positive for hearing loss and trouble swallowing.  Respiratory:       Snoring  Genitourinary:       Impotence  Musculoskeletal:       Cramps  Allergic/Immunologic: Positive for environmental allergies.  Neurological: Positive for dizziness, tremors, syncope and weakness.       Memory loss  Hematological: Bruises/bleeds easily.       Anemia  Psychiatric/Behavioral: Positive for confusion, sleep disturbance and dysphoric mood. The patient is nervous/anxious.     Objective:  Neurologic Exam  Physical Exam Physical Examination:   Filed Vitals:   11/12/12 0824  BP: 118/75  Pulse: 70  Temp:    He die not have orthostatic drop in BP today.  General Examination: The patient is a very pleasant 67 y.o. male in no acute distress.  HEENT: Normocephalic, atraumatic, pupils are equal, round and reactive to light and accommodation. Extraocular tracking shows mild saccadic breakdown without nystagmus noted. There is no limitation to his gaze. There is mild decrease in eye blink rate. Hearing is intact. Face is symmetric with moderate facial masking and normal facial sensation. There is no lip, neck or jaw tremor. Neck is moderately rigid with intact passive ROM. There are no carotid bruits on auscultation. Oropharynx exam reveals mild mouth dryness. No significant airway crowding is noted. Mallampati is class II. Tongue protrudes centrally and palate elevates symmetrically.    Chest: is clear to auscultation without wheezing, rhonchi or crackles noted.  Heart: sounds are regular and normal without murmurs, rubs or gallops noted.   Abdomen: is soft, non-tender and  non-distended with normal bowel sounds appreciated on auscultation.  Extremities: There is no pitting edema in the distal lower extremities bilaterally. Pedal pulses are intact.  Skin: is warm and dry with no trophic changes noted.  Musculoskeletal: exam reveals no obvious joint deformities, tenderness or joint swelling or erythema.  Neurologically:  Mental status: The patient is awake and alert, paying good  attention. He is able to completely provide the history. He is oriented to: person, place, time/date, situation, day of week, month of year and year. His memory, attention, language and knowledge are intact. There is no aphasia, agnosia, apraxia or anomia. There is a mild degree of bradyphrenia. Speech is mildly hypophonic with mild dysarthria noted. Mood is congruent and affect is normal.  Cranial nerves are as described above under HEENT exam. In addition, shoulder shrug is normal with equal shoulder height noted.  Motor exam: Normal bulk, and strength for age is noted. Tone is mildly rigid with presence of cogwheeling in the right upper extremity. There is overall moderate bradykinesia. There is no drift or rebound. There is no tremor.  Romberg is negative. Reflexes are 2+ in the upper extremities and 2+ in the lower extremities. Fine motor skills exam reveals: Finger taps are moderately impaired on the right and mildly impaired on the left. Hand movements are moderately impaired on the right and moderately impaired on the left. RAP (rapid alternating patting) is moderately impaired on the right and moderately impaired on the left. Foot taps are moderately impaired on the right and mildly impaired on the left. Foot agility (in the form of heel stomping) is moderately impaired on the right and mildly impaired on the left.    Cerebellar testing shows no dysmetria or intention tremor on finger to nose testing. There is no truncal or gait ataxia.   Sensory exam is intact to light touch.    Gait, station and balance: He stands up from the seated position with no significance  difficulty and needs no assistance. No veering to one side is noted. He is not noted to lean to the side. Posture is mildly stooped. No lightheadedness reported. Stance is narrow-based. He walks with decrease in stride length and pace and decreased arm swing on the right and mild dystonic posturing with the right arm and hand. He turns in en bloc. Tandem walk is not possible. Balance is mildly impaired.   Assessment and Plan:   In summary, [name] is a very pleasant 67 year old male with a history of right-sided predominant Parkinson's disease with akinetic-rigid symptoms and signs. He had a recent syncopal spell which I do believe was secondary to a combination of things. I explained to him that because of his Parkinson's disease he is at risk for hypotensive episodes and Parkinson's disease patients often have autonomic dysregulation. In addition, he was working in the yard in the heat for prolonged period of time and was probably on the verge of being dehydrated and he was bending down a lot at changing positions quickly. In addition to that some of this medication can cause him to have a drop in blood pressure, in particular his levodopa. As we age we also have a lesser sense of thirst and we also have some age-related changes in our autonomic regulation. All in all, I think this was a perfect storm and I do not think there is any need for making any medication changes at this time. Down the Road we can consider a medication such as midodrine or Florinef to help with maintaining blood pressure values but at this juncture he is stable albeit on the lower side with his blood pressure and we need to monitor this. He's not on any blood pressure medication. I talked to him at length about his condition and asked him to change positions slowly, avoid heat exposure, stay always well-hydrated, use of stool for working  in the art, do not work more than half an hour at a time without a break. He is advised to come inside and get a water break and snack break when he is planning to work outside. He was seen by cardiology which I think is reasonable. He had a head CT which was negative. He had blood work recently so I am not going to add anything at this juncture. He is requesting information about financial help with Azilect and I have asked him to enroll with PD support solutions. I would like to see him back in 6 months from now, sooner if the need arises.  I answered all his questions today and the patient was in agreement with the above outlined plan. I encouraged him to call with any interim questions, concerns, problems or updates and refill requests.

## 2012-12-04 ENCOUNTER — Ambulatory Visit (INDEPENDENT_AMBULATORY_CARE_PROVIDER_SITE_OTHER): Payer: Medicare Other | Admitting: Emergency Medicine

## 2012-12-04 VITALS — BP 110/80 | HR 63 | Temp 97.9°F | Resp 18 | Wt 127.0 lb

## 2012-12-04 DIAGNOSIS — S61412A Laceration without foreign body of left hand, initial encounter: Secondary | ICD-10-CM

## 2012-12-04 DIAGNOSIS — Z23 Encounter for immunization: Secondary | ICD-10-CM

## 2012-12-04 DIAGNOSIS — S61409A Unspecified open wound of unspecified hand, initial encounter: Secondary | ICD-10-CM

## 2012-12-04 NOTE — Patient Instructions (Signed)
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
## 2012-12-06 ENCOUNTER — Ambulatory Visit: Payer: Medicare Other | Admitting: Neurology

## 2012-12-10 ENCOUNTER — Telehealth: Payer: Self-pay | Admitting: *Deleted

## 2012-12-10 NOTE — Telephone Encounter (Signed)
[facility] Mychart After Visit Questionnaire    Question 12/10/2012 7:10 PM   How are you feeling after your recent visit? find , just alittle tire   Does the recommended course of treatment seem to be helping your symptoms? yes   Are you experiencing any side effects from your recommended treatment? none   is there anything else you would like to ask your physician? went is my next phyical

## 2012-12-11 NOTE — Telephone Encounter (Signed)
Message sent in Mychart.

## 2012-12-25 ENCOUNTER — Other Ambulatory Visit: Payer: Self-pay | Admitting: Neurology

## 2012-12-27 ENCOUNTER — Other Ambulatory Visit: Payer: Self-pay | Admitting: Neurology

## 2012-12-30 ENCOUNTER — Telehealth: Payer: Self-pay | Admitting: Neurology

## 2012-12-30 NOTE — Telephone Encounter (Signed)
I called to let patient and left VM that his medications had been submitted. I have reordered them to cover 3 months at a time.

## 2013-02-03 ENCOUNTER — Telehealth: Payer: Self-pay | Admitting: Radiology

## 2013-02-03 NOTE — Telephone Encounter (Signed)
Can we please contact patient as I do not feel Foster is appropriate to discuss the issues/questions via e-mail.   Thank You!  The following email was submitted to your website from [name] is pass time for a complete physical . I understand that Dr.Guest is not taking any appointments but he will be at the walk-in client Sat. morning 8am until 1pm , is that correct . I would like to see Dr. [name] for this physical.   I understand that I cannot eat anything after midnight , is that correct .    Here is a list of my medications : (1) Azilect (1 mg tablet a day) (2) Levothyroxine(0.05mg  ( ) 1 tablet every other day ) (3) Ropinirole 2mg  ( 1/2 a tablet in the morning , 1/2 tablet at noon , 1/2 tablet in the evening & take 1 &1/2 at bedtime. (4) Levothyroxine 0.075mg  ( ) take 1 tablet every other day (5) Carbidopa/Levodopa 25-100mg  (1 tablet 3 times a day (6) Fish Oil 1200mg  (1 a day) (7) One Daily Men's Multivitamin (1 tablet a day (8) Biotin ( 1 tablet a day (9) Aspirin 81mg  ( 1 a day      Allergic to penicillin(rash) & ibuprofen(blood in stool) Date of birth: 04/29/1945

## 2013-02-04 NOTE — Telephone Encounter (Signed)
Spoke with patient to let him know it was ok for him to come in to [facility] on Saturday and have his physical with Dr [name].  I will mail patient his paperwork so he can have it ready on Saturday.

## 2013-02-08 ENCOUNTER — Ambulatory Visit (INDEPENDENT_AMBULATORY_CARE_PROVIDER_SITE_OTHER): Payer: Medicare Other | Admitting: Internal Medicine

## 2013-02-08 VITALS — BP 110/70 | HR 72 | Temp 98.1°F | Resp 16 | Ht 67.5 in | Wt 127.0 lb

## 2013-02-08 DIAGNOSIS — Z125 Encounter for screening for malignant neoplasm of prostate: Secondary | ICD-10-CM

## 2013-02-08 DIAGNOSIS — Z0000 Encounter for general adult medical examination without abnormal findings: Secondary | ICD-10-CM | POA: Diagnosis not present

## 2013-02-08 DIAGNOSIS — Z800 Family history of malignant neoplasm of digestive organs: Secondary | ICD-10-CM | POA: Diagnosis not present

## 2013-02-08 DIAGNOSIS — E039 Hypothyroidism, unspecified: Secondary | ICD-10-CM | POA: Diagnosis not present

## 2013-02-08 DIAGNOSIS — G20 Parkinson's disease: Secondary | ICD-10-CM | POA: Diagnosis not present

## 2013-02-08 DIAGNOSIS — IMO0001 Reserved for inherently not codable concepts without codable children: Secondary | ICD-10-CM

## 2013-02-08 DIAGNOSIS — R350 Frequency of micturition: Secondary | ICD-10-CM | POA: Diagnosis not present

## 2013-02-08 DIAGNOSIS — Z79899 Other long term (current) drug therapy: Secondary | ICD-10-CM | POA: Diagnosis not present

## 2013-02-08 LAB — POCT CBC
Lymph, poc: 1.1 (ref 0.6–3.4)
MCHC: 31.1 g/dL — AB (ref 31.8–35.4)
MID (cbc): 0.4 (ref 0–0.9)
MPV: 9.8 fL (ref 0–99.8)
POC Granulocyte: 4 (ref 2–6.9)
POC LYMPH PERCENT: 19.9 %L (ref 10–50)
POC MID %: 7.1 %M (ref 0–12)
Platelet Count, POC: 178 10*3/uL (ref 142–424)
RDW, POC: 14.1 %

## 2013-02-08 LAB — POCT UA - MICROSCOPIC ONLY
Casts, Ur, LPF, POC: NEGATIVE
Mucus, UA: NEGATIVE
Yeast, UA: NEGATIVE

## 2013-02-08 LAB — LIPID PANEL
Cholesterol: 196 mg/dL (ref 0–200)
Total CHOL/HDL Ratio: 2.9 Ratio
VLDL: 13 mg/dL (ref 0–40)

## 2013-02-08 LAB — COMPREHENSIVE METABOLIC PANEL
ALT: <8 U/L (ref 0–53)
AST: 43 U/L — ABNORMAL HIGH (ref 0–37)
Albumin: 4.3 g/dL (ref 3.5–5.2)
Alkaline Phosphatase: 89 U/L (ref 39–117)
Calcium: 9.5 mg/dL (ref 8.4–10.5)
Chloride: 105 mEq/L (ref 96–112)
Glucose, Bld: 84 mg/dL (ref 70–99)
Sodium: 140 mEq/L (ref 135–145)
Total Bilirubin: 0.6 mg/dL (ref 0.3–1.2)
Total Protein: 7.1 g/dL (ref 6.0–8.3)

## 2013-02-08 LAB — POCT URINALYSIS DIPSTICK
Bilirubin, UA: NEGATIVE
Blood, UA: NEGATIVE
Glucose, UA: NEGATIVE
Spec Grav, UA: 1.02
Urobilinogen, UA: 0.2
pH, UA: 5.5

## 2013-02-08 LAB — IFOBT (OCCULT BLOOD): IFOBT: NEGATIVE

## 2013-02-08 NOTE — Progress Notes (Signed)
   Subjective:    Patient ID: [name], male    DOB: 24-Jun-1945, 67 y.o.   MRN: [id number]  HPI Feels new neuro has him a little over medicated. Has had a decent year, no hospitalizations. Occ depression. Parkinsons has not obviously progressed any. Walks his dog daily many miles. Thyroid stable.  Review of Systems  Constitutional: Positive for activity change and fatigue.  HENT: Positive for drooling, sinus pressure and trouble swallowing.   Eyes: Negative.   Respiratory: Negative.   Cardiovascular: Negative.   Gastrointestinal: Negative.   Endocrine: Positive for polyuria.  Genitourinary: Positive for frequency.  Musculoskeletal: Positive for back pain.  Psychiatric/Behavioral: Positive for confusion, dysphoric mood and decreased concentration. The patient is nervous/anxious.        Objective:   Physical Exam  Constitutional: He is oriented to person, place, and time. He appears well-developed and well-nourished.  HENT:  Right Ear: External ear normal.  Left Ear: External ear normal.  Nose: Nose normal.  Mouth/Throat: Oropharynx is clear and moist.  Eyes: Conjunctivae are normal. Pupils are equal, round, and reactive to light.  Neck: Normal range of motion. Neck supple. No tracheal deviation present. Thyromegaly present.  Cardiovascular: Normal rate, regular rhythm, normal heart sounds and intact distal pulses.   No murmur heard. Pulmonary/Chest: Effort normal and breath sounds normal.  Abdominal: Soft. He exhibits no distension. There is no tenderness.  Genitourinary: Rectum normal, prostate normal and penis normal.  Musculoskeletal: Normal range of motion.  Lymphadenopathy:    He has no cervical adenopathy.  Neurological: He is alert and oriented to person, place, and time. He has normal strength. He displays abnormal reflex. No cranial nerve deficit or sensory deficit. Coordination and gait abnormal.  Cog wheeling, tremor  Skin: No rash noted.  Psychiatric:  Judgment and thought content normal. His speech is delayed. He is slowed. He exhibits a depressed mood.   Results for orders placed in visit on 02/08/13  POCT CBC      Result Value Range   WBC 5.5  4.6 - 10.2 K/uL   Lymph, poc 1.1  0.6 - 3.4   POC LYMPH PERCENT 19.9  10 - 50 %L   MID (cbc) 0.4  0 - 0.9   POC MID % 7.1  0 - 12 %M   POC Granulocyte 4.0  2 - 6.9   Granulocyte percent 73.0  37 - 80 %G   RBC 3.96 (*) 4.69 - 6.13 M/uL   Hemoglobin 11.8 (*) 14.1 - 18.1 g/dL   HCT, POC 40.9 (*) 81.1 - 53.7 %   MCV 95.9  80 - 97 fL   MCH, POC 29.8  27 - 31.2 pg   MCHC 31.1 (*) 31.8 - 35.4 g/dL   RDW, POC 91.4     Platelet Count, POC 178  142 - 424 K/uL   MPV 9.8  0 - 99.8 fL  IFOBT (OCCULT BLOOD)      Result Value Range   IFOBT Negative            Assessment & Plan:  See neurology to adjust meds for parkinson disease. RF meds 1 year Shingles vaccine ordered.

## 2013-02-08 NOTE — Progress Notes (Signed)
   Subjective:    Patient ID: [name], male    DOB: [birth year], 67 y.o.   MRN: [id number]  HPI    Review of Systems  Constitutional: Positive for activity change and fatigue.  HENT: Positive for drooling, sinus pressure and trouble swallowing.   Eyes: Negative.   Respiratory: Negative.   Cardiovascular: Negative.   Gastrointestinal: Negative.   Endocrine: Positive for polyuria.  Genitourinary: Positive for frequency.  Musculoskeletal: Positive for back pain.  Skin: Negative.   Allergic/Immunologic: Positive for environmental allergies.  Neurological: Positive for dizziness, tremors, syncope, speech difficulty and headaches.  Hematological: Bruises/bleeds easily.  Psychiatric/Behavioral: Positive for confusion, dysphoric mood and decreased concentration. The patient is nervous/anxious.        Objective:   Physical Exam        Assessment & Plan:

## 2013-02-08 NOTE — Patient Instructions (Signed)

## 2013-02-09 ENCOUNTER — Telehealth: Payer: Self-pay

## 2013-02-09 NOTE — Telephone Encounter (Signed)
How do I order this? Im not sure if Im ordering this correctly. Please advise.

## 2013-02-09 NOTE — Telephone Encounter (Signed)
Pt states that dr guest was supposed to send in the shingles vaccine to [facility] [address] and when patient called it was not there

## 2013-02-10 ENCOUNTER — Encounter: Payer: Self-pay | Admitting: Radiology

## 2013-02-10 ENCOUNTER — Telehealth: Payer: Self-pay | Admitting: Radiology

## 2013-02-10 MED ORDER — ZOSTER VACCINE LIVE 19400 UNT/0.65ML ~~LOC~~ SOLR: 0.6500 mL | Freq: Once | SUBCUTANEOUS | Status: DC

## 2013-02-10 NOTE — Telephone Encounter (Signed)
Sent!

## 2013-02-10 NOTE — Addendum Note (Signed)
Addended byCaffie Damme on: 02/10/2013 12:58 PM   Modules accepted: Orders

## 2013-02-10 NOTE — Telephone Encounter (Signed)
Rx printed

## 2013-02-18 NOTE — Telephone Encounter (Signed)
Med encounter only 

## 2013-02-23 ENCOUNTER — Other Ambulatory Visit: Payer: Self-pay | Admitting: Internal Medicine

## 2013-02-24 ENCOUNTER — Other Ambulatory Visit: Payer: Self-pay | Admitting: Physician Assistant

## 2013-03-20 ENCOUNTER — Other Ambulatory Visit: Payer: Self-pay

## 2013-03-20 MED ORDER — ROPINIROLE HCL 2 MG PO TABS: ORAL_TABLET | ORAL | Status: DC

## 2013-04-21 ENCOUNTER — Other Ambulatory Visit: Payer: Self-pay | Admitting: Neurology

## 2013-04-27 ENCOUNTER — Other Ambulatory Visit: Payer: Self-pay | Admitting: Neurology

## 2013-05-12 ENCOUNTER — Ambulatory Visit (INDEPENDENT_AMBULATORY_CARE_PROVIDER_SITE_OTHER): Payer: Medicare Other | Admitting: Neurology

## 2013-05-12 ENCOUNTER — Encounter: Payer: Self-pay | Admitting: Neurology

## 2013-05-12 VITALS — BP 86/53 | HR 61 | Temp 96.8°F | Ht 68.0 in | Wt 130.0 lb

## 2013-05-12 DIAGNOSIS — G20 Parkinson's disease: Secondary | ICD-10-CM

## 2013-05-12 DIAGNOSIS — I959 Hypotension, unspecified: Secondary | ICD-10-CM

## 2013-05-12 DIAGNOSIS — G20A1 Parkinson's disease without dyskinesia, without mention of fluctuations: Secondary | ICD-10-CM

## 2013-05-12 MED ORDER — RASAGILINE MESYLATE 1 MG PO TABS: ORAL_TABLET | ORAL | Status: DC

## 2013-05-12 MED ORDER — ROPINIROLE HCL 2 MG PO TABS: ORAL_TABLET | ORAL | Status: DC

## 2013-05-12 MED ORDER — CARBIDOPA-LEVODOPA 25-100 MG PO TABS: ORAL_TABLET | ORAL | Status: DC

## 2013-05-12 NOTE — Patient Instructions (Addendum)
I think your Parkinson's disease has remained fairly stable, which is reassuring. Nevertheless, as you know, this disease does progress with time. It can affect your balance, your memory, your mood, your bowel and bladder function, your posture, balance and walking. Overall you are doing fairly well but I do want to suggest a few things today:  Remember to drink plenty of fluid, eat healthy meals and do not skip any meals. Try to eat protein with a every meal and eat a healthy snack such as fruit or nuts in between meals. Try to keep a regular sleep-wake schedule and try to exercise daily, particularly in the form of walking, 20-30 minutes a day, if you can.   Taking your medication on schedule is key.   Try to stay active physically and mentally. Engage in social activities in your community and with your family and try to keep up with current events by reading the newspaper or watching the news. Try to do word puzzles and you may like to do word puzzles and brain games on the computer such as on [email or web address]   As far as your medications are concerned, I would like to suggest that you take your current medication with the following additional changes: as far as your carbidopa-levodopa: Please try to take the medication away from you mealtimes, that is, ideally either one hour before or 2 hours after your meal to ensure optimal absorption. The medication can interfere with the protein content of your meal and trying to the protein in your food and therefore not get fully absorbed.  Common side effects reported are: Nausea, vomiting, sedation, confusion, lightheadedness. Rare side effects include hallucinations, severe nausea or vomiting, diarrhea and significant drop in blood pressure especially when going from lying to standing or from sitting to standing.   Drink more water to keep your blood pressure in the normal range.   As far as diagnostic testing, I will order: no new test.   I would like to  see you back in 6 months, sooner if we need to. Please call us with any interim questions, concerns, problems, updates or refill requests.  Please also call us for any test results so we can go over those with you on the phone. Our nursing staff will answer any of your questions and relay your messages to me and also relay most of my messages to you.  Our phone number is [phone]. We also have an after hours call service for urgent matters and there is a physician on-call for urgent questions, that cannot wait till the next work day. For any emergencies you know to call 911 or go to the nearest emergency room.

## 2013-05-12 NOTE — Progress Notes (Signed)
Subjective:    Patient ID: [name] is a 68 y.o. male.  HPI   Interim history:   [name] is a very pleasant 68 year old right-handed gentleman with an underlying medical history of hypothyroidism, OSA (not using CPAP currently, but he tried), RLS, MCI, CTS, depression, anemia, and allergies, who presents for followup consultation of his right-sided predominant, akinetic-rigid type Parkinson's disease, complicated by sleep disorder, RLS, memory loss, prior hallucinations, prior fall with injury, and a recent syncopal spell. He is unaccompanied today. I last saw him on 11/12/2012, at which time he presented after a recent syncopal spell. I felt at the time that the syncope was secondary to a combination of things including autonomic dysregulation in the context of Parkinson's disease, exposure to heat for prolonged period of time with dehydration and quick changes in position as well as potential medication side effects. He had a head CT which was negative. He also had recent blood work. I did not change his medications at the time but talk to him about supportive measures including changing positions slowly, avoid prolonged heat exposure, staying well hydrated and using a stool for working in the yard and taking frequent breaks when working outside.   Today, he reports, that he has a little lightheadedness today, his BP is in fact low today. He denies any recent syncopal spells and no falls. He stopped the Lisbeth Ply some 4 months due to cost. He denies and recent illness and has had no medication changes. He has some word finding difficulty. He drives and has not had any recent issues. He has no constipation and no hallucinations currently. He sleeps fairly well.   He had an event of LOC on 10/30/12. He reported that he had not been feeling well for the past 2 weeks prior to his syncope. He had been feeling dizzy especially with change of position with intermittent nausea reported. He had been in  his yard working for several hours and became dizzy and had a blackout spell. He fell into the bushes, and came to after some seconds. He was bending up and down at lot. After he regained consciousness, he was feeling weak and eventually was able to go to the porch, where he sat for about 45 minutes. His wife was at work. He had some slight confusion and lack of energy, but denied any focal weakness/CP/palpitations/SOB. He did not have any prior syncopal spells. He had blood work at the [facility] and saw Dr. Tamala Julian on 10/31/2012. His BUN was borderline at 24, he had a CTH on 10/31/12, which was negative and yesterday went for cardiological consultation with Dr. Debara Pickett, who felt, that he had a vasovagal event in the context of dysautonomia with PD.  I first met him on 06/05/12, at which time I felt his Parkinson's disease was stable and I did not make any medication changes.  He previously followed with Dr. Morene Antu and was last seen by him on 02/05/2012, at which time he was complaining of right hip pain. No medication changes were done at the time. He has been on Synthroid, Sinemet 25/100 mg strength one tablet 3 times a day, Requip 2 mg strength half a pill at 6:30, half a pill at noon, half a pill at 5 PM and one and a half a pill at 9 PM. He is on rasagiline 1 mg once daily, baby aspirin, multivitamin, Toviaz.  He received a steroid injection into the R hip in 8/14, which helped the pain. He  worked for [employer]. He lives with his wife in a one storey at [facility]. They have 2 grown children, son and daughter, both local. Two MAs and 4 cousins had Huntington's and he has been tested and was negative for it. Dr. Erling Cruz arranged the genetic testing. He has occasional depressive Sx, but better when he can get out and walk, he walks about 6 miles/day.  He was diagnosed with Parkinson's disease in July 2009, with symptoms going back a year prior and consisted of trouble with speech and eating. He was  initially started on Requip, Azilect and and Sinemet. He had side effects from the Requip including excessive spending, daytime sleepiness and hypersexuality. His Requip dose was therefore decreased. He exercises regularly. He is independent in his ADLs. He has had visual hallucinations. He fell in October 2011 fracturing his left distal radius, requiring surgery in December 2011. He had right-sided leg cramps improved with Flexeril, but stopped the medication. He denied leg edema, or compulsive behavior. He has bladder incontinence. In December 2013 his MMSE was 29, clock drawing was poor and animal fluency was 17, geriatric depression scale was 8/15.   His Past Medical History Is Significant For: Past Medical History  Diagnosis Date  . Thyroid disease   . Allergy   . Depression   . Parkinson's disease   . Anemia   . Parkinson's disease 06/05/2012    His Past Surgical History Is Significant For: Past Surgical History  Procedure Laterality Date  . Mandible fracture surgery    . Hernia repair    . Wrist fracture surgery    . Rotator cuff repair    . Tonsillectomy    . Fracture surgery      jaw  . Vasectomy    . Appendectomy      His Family History Is Significant For: Family History  Problem Relation Age of Onset  . Cancer Father     colon    His Social History Is Significant For: History   Social History  . Marital Status: Married    Spouse Name: [name]    Number of Children: 2  . Years of Education: 12+   Occupational History  . DISABILITY     due to Parkinson's   Social History Main Topics  . Smoking status: Never Smoker   . Smokeless tobacco: Never Used  . Alcohol Use: 0 - 4.2 oz/week    0-7 Cans of beer per week  . Drug Use: No  . Sexual Activity: No   Other Topics Concern  . None   Social History Narrative  . None    His Allergies Are:  Allergies  Allergen Reactions  . Ibuprofen Other (See Comments)    Blood in stool.  . Lamictal [Lamotrigine]  Other (See Comments)    Blisters in mouth  . Nsaids   . Penicillins   :   His Current Medications Are:  Outpatient Encounter Prescriptions as of 05/12/2013  Medication Sig  . aspirin 81 MG tablet Take 160 mg by mouth daily.  . AZILECT 1 MG TABS tablet TAKE 1 TABLET BY MOUTH EVERY DAY  . Biotin (BIOTIN 5000) 5 MG CAPS Take 1 capsule by mouth daily.  . carbidopa-levodopa (SINEMET IR) 25-100 MG per tablet TAKE 1 TABLET BY MOUTH THREE TIMES DAILY  . carbidopa-levodopa (SINEMET IR) 25-100 MG per tablet TAKE 1 TABLET BY MOUTH THREE TIMES DAILY  . ferrous sulfate 325 (65 FE) MG tablet Take 325 mg by mouth  daily with breakfast.  . fish oil-omega-3 fatty acids 1000 MG capsule Take 1 g by mouth daily.  Marland Kitchen levothyroxine (SYNTHROID, LEVOTHROID) 50 MCG tablet TAKE 1 TABLET BY MOUTH EVERY OTHER DAY  . levothyroxine (SYNTHROID, LEVOTHROID) 75 MCG tablet TAKE 1 TABLET BY MOUTH EVERY OTHER DAY  . Multiple Vitamins-Minerals (MULTIVITAMIN WITH MINERALS) tablet Take 1 tablet by mouth daily.  Marland Kitchen rOPINIRole (REQUIP) 2 MG tablet TAKE 1/2 TABLET BY MOUTH AT 6:30AM, 1/2 TABLET BY MOUTH AT NOON, 1/2 TABLET BY MOUTH AT 5PM, AND 1 1/2 TABLETS AT 9PM  . zoster vaccine live, PF, (ZOSTAVAX) 67124 UNT/0.65ML injection Inject 19,400 Units into the skin once.  . [DISCONTINUED] AZILECT 1 MG TABS tablet TAKE 1 TABLET BY MOUTH EVERY DAY  . [DISCONTINUED] fesoterodine (TOVIAZ) 4 MG TB24 Take 1 tablet (4 mg total) by mouth daily.  :  Review of Systems:  Out of a complete 14 point review of systems, all are reviewed and negative with the exception of these symptoms as listed below:  Review of Systems  Constitutional: Positive for fatigue.  HENT: Positive for drooling, hearing loss and trouble swallowing.   Eyes: Negative.   Respiratory: Negative.   Cardiovascular: Negative.   Gastrointestinal: Negative.   Endocrine: Positive for polydipsia.  Genitourinary: Positive for frequency.  Musculoskeletal: Positive for gait  problem and myalgias.  Skin: Negative.   Allergic/Immunologic: Positive for environmental allergies.  Neurological: Positive for tremors, syncope and speech difficulty.       Memory loss  Hematological: Bruises/bleeds easily.  Psychiatric/Behavioral: Positive for hallucinations, confusion, dysphoric mood, decreased concentration and agitation.    Objective:  Neurologic Exam  Physical Exam Physical Examination:   Filed Vitals:   05/12/13 1415  BP: 86/53  Foster: 61  Temp:    Upon recheck later: BP 126/76, 57.   General Examination: The patient is a very pleasant 68 y.o. male in no acute distress.  HEENT: Normocephalic, atraumatic, pupils are equal, round and reactive to light and accommodation. Funduscopy is normal. Extraocular tracking shows mild saccadic breakdown without nystagmus noted. There is no limitation to his gaze. There is mild decrease in eye blink rate. Hearing is intact. Face is symmetric with moderate facial masking and normal facial sensation. There is no lip, neck or jaw tremor. Neck is moderately rigid with intact passive ROM. There are no carotid bruits on auscultation. Oropharynx exam reveals mild mouth dryness. No significant airway crowding is noted. Mallampati is class II. Tongue protrudes centrally and palate elevates symmetrically. He has minimal drooling.   Chest: is clear to auscultation without wheezing, rhonchi or crackles noted.  Heart: sounds are regular and normal without murmurs, rubs or gallops noted.   Abdomen: is soft, non-tender and non-distended with normal bowel sounds appreciated on auscultation.  Extremities: There is no pitting edema in the distal lower extremities bilaterally. Pedal pulses are intact.  Skin: is warm and dry with no trophic changes noted.  Musculoskeletal: exam reveals no obvious joint deformities, tenderness or joint swelling or erythema.  Neurologically:  Mental status: The patient is awake and alert, paying good  attention. He is able to completely provide the history. He is oriented to: person, place, time/date, situation, day of week, month of year and year. His memory, attention, language and knowledge are intact. There is no aphasia, agnosia, apraxia or anomia. There is a mild degree of bradyphrenia. Speech is mildly hypophonic with minimal dysarthria noted. Mood is congruent and affect is normal.  Cranial nerves are as described above under  HEENT exam. In addition, shoulder shrug is normal with equal shoulder height noted.  Motor exam: Normal bulk, and strength for age is noted. Tone is mildly rigid with presence of cogwheeling in the right upper extremity. There is overall moderate bradykinesia. There is no drift or rebound. There is no tremor.  Romberg is negative. Reflexes are 2+ in the upper extremities and 2+ in the lower extremities. Fine motor skills exam reveals: Finger taps are moderately impaired on the right and mildly impaired on the left. Hand movements are moderately impaired on the right and mildly impaired on the left. RAP (rapid alternating patting) is moderately impaired on the right and moderately impaired on the left. Foot taps are moderately impaired on the right and mildly impaired on the left. Foot agility (in the form of heel stomping) is mildly impaired on the right and mildly impaired on the left.    Cerebellar testing shows no dysmetria or intention tremor on finger to nose testing. There is no truncal or gait ataxia. Heel to shin is unremarkable b/l.   Sensory exam is intact to light touch, PP, vibration sense and temperature sense in the UEs and LEs bilaterally.  Gait, station and balance: He stands up from the seated position with no significance difficulty and needs no assistance. He does feel mild lightheadedness. No veering to one side is noted. He is not noted to lean to the side. Posture is mildly stooped. No lightheadedness reported. Stance is narrow-based. He walks with  decrease in stride length and pace and decreased arm swing on the right and mild dystonic posturing with the right arm and hand, unchanged. He turns in 2 steps. Tandem walk is not possible. Balance is mildly impaired.   Assessment and Plan:   In summary, MACKENZIE LIA is a very pleasant 68 year old male with a history of right-sided predominant Parkinson's disease of the akinetic-rigid type. He had a syncopal spell some 6 months ago, which was likely secondary to underlying PD and PD related autonomic dysregulation, dehydration and overheating at the time. His BP was initially low today, but he did endorse, not having had much to drink. He was given some water and upon recheck his blood pressure was better. Down the Road we can consider a medication such as midodrine or Florinef to help with maintaining blood pressure values but at this juncture he has actually been stable and he reports that typically his blood pressure is better. I again reminded him to change positions slowly, avoid heat exposure, stay better hydrated, use a stool for working in the yard and not work more than half an hour at a time without a break. He is exam is overall stable which is reassuring. Some 6 months ago he had a head CT which was negative. I suggested we keep him on the same medication regimen. I renewed his prescriptions for Azilect, Sinemet and ropinirole today. I would like to see him back in 6 months from now, sooner if the need arises.  I answered all his questions today and the patient was in agreement with the above outlined plan. I encouraged him to call with any interim questions, concerns, problems or updates and refill requests.

## 2013-06-02 ENCOUNTER — Telehealth: Payer: Self-pay | Admitting: Neurology

## 2013-06-02 NOTE — Telephone Encounter (Signed)
Patient calling requesting a refill of ropinerole.   [facility] uses [facility] [address] [phone]

## 2013-06-02 NOTE — Telephone Encounter (Signed)
A 90 day Rx plus 3 refills was sent on 03/23: Sig: TAKE 1/2 TABLET BY MOUTH AT 6:30AM, 1/2 TABLET BY MOUTH AT NOON, 1/2 TABLET BY MOUTH AT 5PM, AND 1 1/2 TABLETS AT 9PM Notes to Pharmacy: **Patient requests 90 days supply** E-Prescribing Status: Receipt confirmed by pharmacy (05/12/2013 2:52 PM EDT) I called the patient back.  He said he did not call the pharmacy to request refill, so he did not know we already sent it.  He will follow up with them and call us back if needed.

## 2013-06-18 ENCOUNTER — Ambulatory Visit (INDEPENDENT_AMBULATORY_CARE_PROVIDER_SITE_OTHER): Payer: Medicare Other | Admitting: Family Medicine

## 2013-06-18 VITALS — BP 92/64 | HR 66 | Temp 98.4°F | Resp 16 | Ht 67.0 in | Wt 129.2 lb

## 2013-06-18 DIAGNOSIS — R059 Cough, unspecified: Secondary | ICD-10-CM | POA: Diagnosis not present

## 2013-06-18 DIAGNOSIS — J069 Acute upper respiratory infection, unspecified: Secondary | ICD-10-CM

## 2013-06-18 DIAGNOSIS — R972 Elevated prostate specific antigen [PSA]: Secondary | ICD-10-CM

## 2013-06-18 DIAGNOSIS — R05 Cough: Secondary | ICD-10-CM | POA: Diagnosis not present

## 2013-06-18 LAB — POCT CBC
Granulocyte percent: 79.6 %G (ref 37–80)
HCT, POC: 42.8 % — AB (ref 43.5–53.7)
HEMOGLOBIN: 13.5 g/dL — AB (ref 14.1–18.1)
Lymph, poc: 1.2 (ref 0.6–3.4)
MCH, POC: 29.6 pg (ref 27–31.2)
MCHC: 31.5 g/dL — AB (ref 31.8–35.4)
MCV: 93.8 fL (ref 80–97)
MID (cbc): 0.5 (ref 0–0.9)
MPV: 9 fL (ref 0–99.8)
POC GRANULOCYTE: 6.8 (ref 2–6.9)
POC LYMPH PERCENT: 14.5 %L (ref 10–50)
POC MID %: 5.9 % (ref 0–12)
Platelet Count, POC: 218 10*3/uL (ref 142–424)
RBC: 4.56 M/uL — AB (ref 4.69–6.13)
RDW, POC: 14.4 %
WBC: 8.6 10*3/uL (ref 4.6–10.2)

## 2013-06-18 LAB — BASIC METABOLIC PANEL
BUN: 13 mg/dL (ref 6–23)
CO2: 30 mEq/L (ref 19–32)
CREATININE: 1.05 mg/dL (ref 0.50–1.35)
Calcium: 9.6 mg/dL (ref 8.4–10.5)
Chloride: 100 mEq/L (ref 96–112)
GLUCOSE: 99 mg/dL (ref 70–99)
POTASSIUM: 4.6 meq/L (ref 3.5–5.3)
Sodium: 136 mEq/L (ref 135–145)

## 2013-06-18 MED ORDER — BECLOMETHASONE DIPROPIONATE 40 MCG/ACT IN AERS: 1.0000 | INHALATION_SPRAY | Freq: Two times a day (BID) | RESPIRATORY_TRACT | Status: DC

## 2013-06-18 MED ORDER — HYDROCOD POLST-CHLORPHEN POLST 10-8 MG/5ML PO LQCR: 5.0000 mL | Freq: Two times a day (BID) | ORAL | Status: DC | PRN

## 2013-06-18 MED ORDER — PREDNISONE 20 MG PO TABS: ORAL_TABLET | ORAL | Status: DC

## 2013-06-18 NOTE — Progress Notes (Signed)
[facility] [address] [place] [address] [phone]  Date:  06/18/2013   Name:  [name]   DOB:  1945/10/02   MRN:  [id number]  PCP:  Kennon Portela, MD    Chief Complaint: URI and Follow-up   History of Present Illness:  [name] is a 68 y.o. very pleasant male patient who presents with the following:  He is here today with concern about "allergies or sinus."  He has noted nasal congestion, ears are blocked, ST, cough.  He has some sneezing as well.  No fever, chills or aches.   He is coughing up like green phlegm   He has been ill for about 2 days now.   He has tried some nyquil at night- this seemed to "give me a hangover" but did not help him sleep that well.    He states he is usually treated with prednisone and "a breathing treatment" when this sort of sx comes up.   He also wants to recheck his PSA today; his PSA was not high at recent testing but had gone up from previous   BP Readings from Last 3 Encounters:  06/18/13 92/64  05/12/13 86/53  02/08/13 110/70     Patient Active Problem List   Diagnosis Date Noted  . Syncope 11/11/2012  . Autonomic postural hypotension 11/11/2012  . Parkinson's disease 06/05/2012  . Restless leg syndrome 11/15/2011  . Hypothyroid 09/11/2011  . Parkinsonism 04/02/2011  . Depression 04/02/2011    Past Medical History  Diagnosis Date  . Thyroid disease   . Allergy   . Depression   . Parkinson's disease   . Anemia   . Parkinson's disease 06/05/2012    Past Surgical History  Procedure Laterality Date  . Mandible fracture surgery    . Hernia repair    . Wrist fracture surgery    . Rotator cuff repair    . Tonsillectomy    . Fracture surgery      jaw  . Vasectomy    . Appendectomy      History  Substance Use Topics  . Smoking status: Never Smoker   . Smokeless tobacco: Never Used  . Alcohol Use: 0.0 - 4.2 oz/week    0-7 Cans of beer per week    Family History   Problem Relation Age of Onset  . Cancer Father     colon    Allergies  Allergen Reactions  . Ibuprofen Other (See Comments)    Blood in stool.  . Lamictal [Lamotrigine] Other (See Comments)    Blisters in mouth  . Nsaids   . Penicillins     Medication list has been reviewed and updated.  Current Outpatient Prescriptions on File Prior to Visit  Medication Sig Dispense Refill  . aspirin 81 MG tablet Take 160 mg by mouth daily.      . Biotin (BIOTIN 5000) 5 MG CAPS Take 1 capsule by mouth daily.      . carbidopa-levodopa (SINEMET IR) 25-100 MG per tablet TAKE 1 TABLET BY MOUTH THREE TIMES DAILY  270 tablet  3  . levothyroxine (SYNTHROID, LEVOTHROID) 50 MCG tablet TAKE 1 TABLET BY MOUTH EVERY OTHER DAY  30 tablet  6  . levothyroxine (SYNTHROID, LEVOTHROID) 75 MCG tablet TAKE 1 TABLET BY MOUTH EVERY OTHER DAY  30 tablet  11  . Multiple Vitamins-Minerals (MULTIVITAMIN WITH MINERALS) tablet Take 1 tablet by mouth daily.      Marland Kitchen  rasagiline (AZILECT) 1 MG TABS tablet TAKE 1 TABLET BY MOUTH EVERY DAY  30 tablet  5  . rOPINIRole (REQUIP) 2 MG tablet TAKE 1/2 TABLET BY MOUTH AT 6:30AM, 1/2 TABLET BY MOUTH AT NOON, 1/2 TABLET BY MOUTH AT 5PM, AND 1 1/2 TABLETS AT 9PM  270 tablet  3  . zoster vaccine live, PF, (ZOSTAVAX) 84132 UNT/0.65ML injection Inject 19,400 Units into the skin once.  0.65 mL  0  . carbidopa-levodopa (SINEMET IR) 25-100 MG per tablet TAKE 1 TABLET BY MOUTH THREE TIMES DAILY  270 tablet  3  . ferrous sulfate 325 (65 FE) MG tablet Take 325 mg by mouth daily with breakfast.      . fish oil-omega-3 fatty acids 1000 MG capsule Take 1 g by mouth daily.       No current facility-administered medications on file prior to visit.    Review of Systems:  As per HPI- otherwise negative.   Physical Examination: Filed Vitals:   06/18/13 0808  BP: 100/70  Pulse: 66  Temp: 98.4 F (36.9 C)  Resp: 16   Filed Vitals:   06/18/13 0808  Height: 5\' 7"  (1.702 m)  Weight: 129 lb 3.2  oz (58.605 kg)   Body mass index is 20.23 kg/(m^2). Ideal Body Weight: Weight in (lb) to have BMI = 25: 159.3  GEN: WDWN, NAD, Non-toxic, A & O x 3, slim build, looks well HEENT: Atraumatic, Normocephalic. Neck supple. No masses, No LAD.  Bilateral TM wnl, oropharynx normal.  PEERL,EOMI.   Ears and Nose: No external deformity. CV: RRR, No M/G/R. No JVD. No thrill. No extra heart sounds. PULM: CTA B, no crackles, rhonchi. No retractions. No resp. distress. No accessory muscle use.  Slight wheezing bilaterally  ABD: S, NT, ND, +BS. No rebound. No HSM. EXTR: No c/c/e NEURO Normal gait.  PSYCH: Normally interactive. Conversant. Not depressed or anxious appearing.  Calm demeanor.   Results for orders placed in visit on 06/18/13  POCT CBC      Result Value Ref Range   WBC 8.6  4.6 - 10.2 K/uL   Lymph, poc 1.2  0.6 - 3.4   POC LYMPH PERCENT 14.5  10 - 50 %L   MID (cbc) 0.5  0 - 0.9   POC MID % 5.9  0 - 12 %M   POC Granulocyte 6.8  2 - 6.9   Granulocyte percent 79.6  37 - 80 %G   RBC 4.56 (*) 4.69 - 6.13 M/uL   Hemoglobin 13.5 (*) 14.1 - 18.1 g/dL   HCT, POC 42.8 (*) 43.5 - 53.7 %   MCV 93.8  80 - 97 fL   MCH, POC 29.6  27 - 31.2 pg   MCHC 31.5 (*) 31.8 - 35.4 g/dL   RDW, POC 14.4     Platelet Count, POC 218  142 - 424 K/uL   MPV 9.0  0 - 99.8 fL    Assessment and Plan: Acute upper respiratory infections of unspecified site - Plan: Basic metabolic panel, POCT CBC, beclomethasone (QVAR) 40 MCG/ACT inhaler, predniSONE (DELTASONE) 20 MG tablet  Increased prostate specific antigen (PSA) velocity - Plan: PSA  Cough - Plan: chlorpheniramine-HYDROcodone (TUSSIONEX PENNKINETIC ER) 10-8 MG/5ML LQCR  Try to avoid albuterol as it may interact with his azilect.   qvar and prednisone, he requests tussionex (which he has used in the past without ill effects) to use prn cough.   See patient instructions for more details.     Signed  Lamar Blinks, MD

## 2013-06-18 NOTE — Patient Instructions (Addendum)
Use the prednisone as directed.  Also, use the qvar inhaler twice a day. Use this for the next couple of weeks and then you can stop as long as you are feeling better.  Let me know if you do not feel better in the next few days- Sooner if worse.   Use the cough syrup as needed- you may want to start with a 1/2 teaspoon to see if that will be enough.  Remember it will make you sleepy so do not use it when you need to drive.

## 2013-06-19 LAB — PSA: PSA: 1.73 ng/mL (ref <=4.00)

## 2013-06-20 ENCOUNTER — Encounter: Payer: Self-pay | Admitting: Family Medicine

## 2013-06-22 ENCOUNTER — Ambulatory Visit (INDEPENDENT_AMBULATORY_CARE_PROVIDER_SITE_OTHER): Payer: Medicare Other | Admitting: Internal Medicine

## 2013-06-22 VITALS — BP 128/70 | HR 71 | Temp 97.8°F | Resp 16 | Ht 66.5 in | Wt 127.0 lb

## 2013-06-22 DIAGNOSIS — J019 Acute sinusitis, unspecified: Secondary | ICD-10-CM

## 2013-06-22 DIAGNOSIS — R059 Cough, unspecified: Secondary | ICD-10-CM

## 2013-06-22 DIAGNOSIS — R05 Cough: Secondary | ICD-10-CM

## 2013-06-22 MED ORDER — AZITHROMYCIN 250 MG PO TABS: ORAL_TABLET | ORAL | Status: DC

## 2013-06-22 NOTE — Progress Notes (Signed)
This chart was scribed for [employer]. Laney Pastor, MD by Marcha Dutton, ED Scribe. This patient was seen in room 9 and the patient's care was started at 10:30 AM.  Subjective:    Patient ID: [name], male    DOB: 06-29-1945, 68 y.o.   MRN: [id number]  HPI Chief Complaint  Patient presents with  . Cough    productive- green, not much better than wed.     HPI Comments: [name] is a 68 y.o. male who presents to the [facility] for a f/u on his productive cough. He states the cough is keeping him up at night and the prednisone has given him some extra energy. He also reports when he wakes up he's not getting much phlegm out of his nose. He states he feels "stopped up." Pt denies fever. Unable to breathe thru his nose at night.  Prior to Admission medications   Medication Sig Start Date End Date Taking? Authorizing Provider  aspirin 81 MG tablet Take 160 mg by mouth daily.   Yes Historical Provider, MD  beclomethasone (QVAR) 40 MCG/ACT inhaler Inhale 1 puff into the lungs 2 (two) times daily. 06/18/13  Yes Gay Filler Copland, MD  Biotin (BIOTIN 5000) 5 MG CAPS Take 1 capsule by mouth daily.   Yes Historical Provider, MD  carbidopa-levodopa (SINEMET IR) 25-100 MG per tablet TAKE 1 TABLET BY MOUTH THREE TIMES DAILY 12/27/12  Yes Star Age, MD  chlorpheniramine-HYDROcodone (TUSSIONEX PENNKINETIC ER) 10-8 MG/5ML LQCR Take 5 mLs by mouth every 12 (twelve) hours as needed for cough. 06/18/13  Yes Gay Filler Copland, MD  levothyroxine (SYNTHROID, LEVOTHROID) 50 MCG tablet TAKE 1 TABLET BY MOUTH EVERY OTHER DAY 02/24/13  Yes Theda Sers, PA-C  levothyroxine (SYNTHROID, LEVOTHROID) 75 MCG tablet TAKE 1 TABLET BY MOUTH EVERY OTHER DAY 02/23/13  Yes Orma Flaming, MD  Multiple Vitamins-Minerals (MULTIVITAMIN WITH MINERALS) tablet Take 1 tablet by mouth daily.   Yes Historical Provider, MD  predniSONE (DELTASONE) 20 MG tablet Take 2 pills a day for 3 days, then 1 pill a  day for 3 days 06/18/13  Yes Gay Filler Copland, MD  rasagiline (AZILECT) 1 MG TABS tablet TAKE 1 TABLET BY MOUTH EVERY DAY 05/12/13  Yes Star Age, MD  rOPINIRole (REQUIP) 2 MG tablet TAKE 1/2 TABLET BY MOUTH AT 6:30AM, 1/2 TABLET BY MOUTH AT NOON, 1/2 TABLET BY MOUTH AT 5PM, AND 1 1/2 TABLETS AT 9PM 05/12/13  Yes Star Age, MD  carbidopa-levodopa (SINEMET IR) 25-100 MG per tablet TAKE 1 TABLET BY MOUTH THREE TIMES DAILY 05/12/13   Star Age, MD  ferrous sulfate 325 (65 FE) MG tablet Take 325 mg by mouth daily with breakfast.    Historical Provider, MD  fish oil-omega-3 fatty acids 1000 MG capsule Take 1 g by mouth daily.    Historical Provider, MD     Review of Systems  Constitutional: Negative for fever, chills and fatigue.  HENT: Positive for congestion. Negative for rhinorrhea, sinus pressure, sore throat and trouble swallowing.   Respiratory: Positive for cough. Negative for shortness of breath and wheezing.   Musculoskeletal: Positive for myalgias.  Neurological: Negative for headaches.       Objective:   Physical Exam  Nursing note and vitals reviewed. Constitutional: He is oriented to person, place, and time. He appears well-developed and well-nourished. No distress.  HENT:  Head: Normocephalic and atraumatic.  Boggy turbinates with purulent discharge, throat clear  Eyes:  Conjunctivae and EOM are normal. Pupils are equal, round, and reactive to light.  Cardiovascular: Normal rate, regular rhythm and normal heart sounds.   No murmur heard. Pulmonary/Chest: Effort normal and breath sounds normal. No respiratory distress. He has no wheezes. He has no rales.  Chest clear  Neurological: He is alert and oriented to person, place, and time. He has normal reflexes.  Skin: Skin is warm and dry. He is not diaphoretic.     Triage Vitals: BP 128/70  Pulse 71  Temp(Src) 97.8 F (36.6 C) (Oral)  Resp 16  Ht 5' 6.5" (1.689 m)  Wt 127 lb (57.607 kg)  BMI 20.19 kg/m2  SpO2  97%      Assessment & Plan:   Meds ordered this encounter  Medications  . azithromycin (ZITHROMAX) 250 MG tablet    Sig: As packaged    Dispense:  6 tablet    Refill:  0   continue other medications  1. Cough   2. Acute sinusitis, unspecified     Pt advised of plan for treatment and pt agrees.  I have completed the patient encounter in its entirety as documented by the scribe, with editing by me where necessary. Fareeda Downard P. Laney Pastor, M.D.

## 2013-08-28 DIAGNOSIS — D1801 Hemangioma of skin and subcutaneous tissue: Secondary | ICD-10-CM | POA: Diagnosis not present

## 2013-08-28 DIAGNOSIS — L821 Other seborrheic keratosis: Secondary | ICD-10-CM | POA: Diagnosis not present

## 2013-08-28 DIAGNOSIS — D239 Other benign neoplasm of skin, unspecified: Secondary | ICD-10-CM | POA: Diagnosis not present

## 2013-08-28 DIAGNOSIS — L570 Actinic keratosis: Secondary | ICD-10-CM | POA: Diagnosis not present

## 2013-09-01 DIAGNOSIS — H269 Unspecified cataract: Secondary | ICD-10-CM | POA: Diagnosis not present

## 2013-09-21 ENCOUNTER — Other Ambulatory Visit: Payer: Self-pay | Admitting: Internal Medicine

## 2013-11-13 ENCOUNTER — Encounter: Payer: Self-pay | Admitting: Adult Health

## 2013-11-13 ENCOUNTER — Ambulatory Visit (INDEPENDENT_AMBULATORY_CARE_PROVIDER_SITE_OTHER): Payer: Medicare Other | Admitting: Adult Health

## 2013-11-13 VITALS — BP 153/92 | HR 56 | Ht 68.0 in | Wt 128.0 lb

## 2013-11-13 DIAGNOSIS — G20 Parkinson's disease: Secondary | ICD-10-CM

## 2013-11-13 NOTE — Patient Instructions (Signed)
Parkinson Disease Parkinson disease is a disorder of the central nervous system, which includes the brain and spinal cord. A person with this disease slowly loses the ability to completely control body movements. Within the brain, there is a group of nerve cells (basal ganglia) that help control movement. The basal ganglia are damaged and do not work properly in a person with Parkinson disease. In addition, the basal ganglia produce and use a brain chemical called dopamine. The dopamine chemical sends messages to other parts of the body to control and coordinate body movements. Dopamine levels are low in a person with Parkinson disease. If the dopamine levels are low, then the body does not receive the correct messages it needs to move normally.  CAUSES  The exact reason why the basal ganglia get damaged is not known. Some medical researchers have thought that infection, genes, environment, and certain medicines may contribute to the cause.  SYMPTOMS   An early symptom of Parkinson disease is often an uncontrolled shaking (tremor) of the hands. The tremor will often disappear when the affected hand is consciously used.  As the disease progresses, walking, talking, getting out of a chair, and new movements become more difficult.  Muscles get stiff and movements become slower.  Balance and coordination become harder.  Depression, trouble swallowing, urinary problems, constipation, and sleep problems can occur.  Later in the disease, memory and thought processes may deteriorate. DIAGNOSIS  There are no specific tests to diagnose Parkinson disease. You may be referred to a neurologist for evaluation. Your caregiver will ask about your medical history, symptoms, and perform a physical exam. Blood tests and imaging tests of your brain may be performed to rule out other diseases. The imaging tests may include an MRI or a CT scan. TREATMENT  The goal of treatment is to relieve symptoms. Medicines may be  prescribed once the symptoms become troublesome. Medicine will not stop the progression of the disease, but medicine can make movement and balance better and help control tremors. Speech and occupational therapy may also be prescribed. Sometimes, surgical treatment of the brain can be done in young people. HOME CARE INSTRUCTIONS  Get regular exercise and rest periods during the day to help prevent exhaustion and depression.  If getting dressed becomes difficult, replace buttons and zippers with Velcro and elastic on your clothing.  Take all medicine as directed by your caregiver.  Install grab bars or railings in your home to prevent falls.  Go to speech or occupational therapy as directed.  Keep all follow-up visits as directed by your caregiver. SEEK MEDICAL CARE IF:  Your symptoms are not controlled with your medicine.  You fall.  You have trouble swallowing or choke on your food. MAKE SURE YOU:  Understand these instructions.  Will watch your condition.  Will get help right away if you are not doing well or get worse. Document Released: 02/04/2000 Document Revised: 06/03/2012 Document Reviewed: 03/08/2011 Exit[employer] Patient Information 2015 [employer]. This information is not intended to replace advice given to you by your health care provider. Make sure you discuss any questions you have with your health care provider.  

## 2013-11-13 NOTE — Progress Notes (Addendum)
PATIENT: [name] DOB: 04-24-1945  REASON FOR VISIT: follow up HISTORY FROM: patient  HISTORY OF PRESENT ILLNESS: [name] is a 68 year old male with a history of parkinson's Disease. He returns today for follow-up. He is currently taking Sinement, Azilect and Requip. He is doing well with these medications. He reports that his BP has been stable no additional episodes of syncope. He states that his tremor is very controlled, he will occasionally have a tremor in the right hand. He states that his gait has remained the same. Denies any falls. He has noticed that he has been getting choked a lot. He notices it with solids and liquids. He states that he has tried to stop the Azilect due to the cost but when he did he felt " out of it." He states that he sleeps well out night. Denies dreaming. Denies talking in his sleep or acting at dreams. Denies any trouble with his memory. He exercised daily. He walks at least 6 miles a day and he does yard work.   HISTORY 05/12/13 (SA): [name] is a very pleasant 68 year old right-handed gentleman with an underlying medical history of hypothyroidism, OSA (not using CPAP currently, but he tried), RLS, MCI, CTS, depression, anemia, and allergies, who presents for followup consultation of his right-sided predominant, akinetic-rigid type Parkinson's disease, complicated by sleep disorder, RLS, memory loss, prior hallucinations, prior fall with injury, and a recent syncopal spell. He is unaccompanied today. I last saw him on 11/12/2012, at which time he presented after a recent syncopal spell. I felt at the time that the syncope was secondary to a combination of things including autonomic dysregulation in the context of Parkinson's disease, exposure to heat for prolonged period of time with dehydration and quick changes in position as well as potential medication side effects. He had a head CT which was negative. He also had recent blood work. I did not  change his medications at the time but talk to him about supportive measures including changing positions slowly, avoid prolonged heat exposure, staying well hydrated and using a stool for working in the yard and taking frequent breaks when working outside. Today, he reports, that he has a little lightheadedness today, his BP is in fact low today. He denies any recent syncopal spells and no falls. He stopped the Lisbeth Ply some 4 months due to cost. He denies and recent illness and has had no medication changes. He has some word finding difficulty. He drives and has not had any recent issues. He has no constipation and no hallucinations currently. He sleeps fairly well.  He had an event of LOC on 10/30/12. He reported that he had not been feeling well for the past 2 weeks prior to his syncope. He had been feeling dizzy especially with change of position with intermittent nausea reported. He had been in his yard working for several hours and became dizzy and had a blackout spell. He fell into the bushes, and came to after some seconds. He was bending up and down at lot. After he regained consciousness, he was feeling weak and eventually was able to go to the porch, where he sat for about 45 minutes. His wife was at work. He had some slight confusion and lack of energy, but denied any focal weakness/CP/palpitations/SOB. He did not have any prior syncopal spells. He had blood work at the [facility] and saw Dr. Tamala Julian on 10/31/2012. His BUN was borderline at 24, he had a CTH  on 10/31/12, which was negative and yesterday went for cardiological consultation with Dr. Debara Pickett, who felt, that he had a vasovagal event in the context of dysautonomia with PD.  I first met him on 06/05/12, at which time I felt his Parkinson's disease was stable and I did not make any medication changes.  He previously followed with Dr. Morene Antu and was last seen by him on 02/05/2012, at which time he was complaining of right hip pain. No medication  changes were done at the time. He has been on Synthroid, Sinemet 25/100 mg strength one tablet 3 times a day, Requip 2 mg strength half a pill at 6:30, half a pill at noon, half a pill at 5 PM and one and a half a pill at 9 PM. He is on rasagiline 1 mg once daily, baby aspirin, multivitamin, Toviaz.   REVIEW OF SYSTEMS: Full 14 system review of systems performed and notable only for:  Constitutional: Fatigue  Eyes: N/A Ear/Nose/Throat: Hearing loss, trouble swallowing, drooling  Skin: N/A  Cardiovascular: N/A  Respiratory: Cough, choking  Gastrointestinal: N/A  Genitourinary: Frequency of urination, urgency Hematology/Lymphatic: Bruise/bleed easily  Endocrine: N/A Musculoskeletal: Back pain, muscle cramps  Allergy/Immunology: N/A  Neurological: Memory loss, speech difficulty, tremors, facial drooping Psychiatric: Confusion, decreased concentration, depression Sleep: Restless leg, daytime sleepiness, snoring   ALLERGIES: Allergies  Allergen Reactions  . Ibuprofen Other (See Comments)    Blood in stool.  . Lamictal [Lamotrigine] Other (See Comments)    Blisters in mouth  . Nsaids   . Penicillins     HOME MEDICATIONS: Outpatient Prescriptions Prior to Visit  Medication Sig Dispense Refill  . aspirin 81 MG tablet Take 160 mg by mouth daily.      . Biotin (BIOTIN 5000) 5 MG CAPS Take 1 capsule by mouth daily.      . carbidopa-levodopa (SINEMET IR) 25-100 MG per tablet TAKE 1 TABLET BY MOUTH THREE TIMES DAILY  270 tablet  3  . levothyroxine (SYNTHROID, LEVOTHROID) 50 MCG tablet TAKE 1 TABLET BY MOUTH EVERY OTHER DAY  30 tablet  6  . levothyroxine (SYNTHROID, LEVOTHROID) 75 MCG tablet TAKE 1 TABLET BY MOUTH EVERY OTHER DAY  30 tablet  11  . Multiple Vitamins-Minerals (MULTIVITAMIN WITH MINERALS) tablet Take 1 tablet by mouth daily.      . rasagiline (AZILECT) 1 MG TABS tablet TAKE 1 TABLET BY MOUTH EVERY DAY  30 tablet  5  . rOPINIRole (REQUIP) 2 MG tablet TAKE 1/2 TABLET BY MOUTH  AT 6:30AM, 1/2 TABLET BY MOUTH AT NOON, 1/2 TABLET BY MOUTH AT 5PM, AND 1 1/2 TABLETS AT 9PM  270 tablet  3  . azithromycin (ZITHROMAX) 250 MG tablet As packaged  6 tablet  0  . beclomethasone (QVAR) 40 MCG/ACT inhaler Inhale 1 puff into the lungs 2 (two) times daily.  1 Inhaler  12  . carbidopa-levodopa (SINEMET IR) 25-100 MG per tablet TAKE 1 TABLET BY MOUTH THREE TIMES DAILY  270 tablet  3  . chlorpheniramine-HYDROcodone (TUSSIONEX PENNKINETIC ER) 10-8 MG/5ML LQCR Take 5 mLs by mouth every 12 (twelve) hours as needed for cough.  60 mL  0  . ferrous sulfate 325 (65 FE) MG tablet Take 325 mg by mouth daily with breakfast.      . fish oil-omega-3 fatty acids 1000 MG capsule Take 1 g by mouth daily.      . predniSONE (DELTASONE) 20 MG tablet Take 2 pills a day for 3 days, then 1 pill a  day for 3 days  9 tablet  0   No facility-administered medications prior to visit.    PAST MEDICAL HISTORY: Past Medical History  Diagnosis Date  . Thyroid disease   . Allergy   . Depression   . Parkinson's disease   . Anemia   . Parkinson's disease 06/05/2012    PAST SURGICAL HISTORY: Past Surgical History  Procedure Laterality Date  . Mandible fracture surgery    . Hernia repair    . Wrist fracture surgery    . Rotator cuff repair    . Tonsillectomy    . Fracture surgery      jaw  . Vasectomy    . Appendectomy      FAMILY HISTORY: Family History  Problem Relation Age of Onset  . Cancer Father     colon    SOCIAL HISTORY: History   Social History  . Marital Status: Married    Spouse Name: [name]    Number of Children: 2  . Years of Education: 12+   Occupational History  . DISABILITY     due to Parkinson's   Social History Main Topics  . Smoking status: Never Smoker   . Smokeless tobacco: Never Used  . Alcohol Use: 0.0 - 4.2 oz/week    0-7 Cans of beer per week  . Drug Use: No  . Sexual Activity: No   Other Topics Concern  . Not on file   Social History Narrative    Patient is married with 2 children.   Patient is right handed.   Patient has high school education.   Patient drinks 1-2 cups daily.      PHYSICAL EXAM  Filed Vitals:   11/13/13 1137  BP: 153/92  Pulse: 56  Height: '5\' 8"'  (1.727 m)  Weight: 128 lb (58.06 kg)   Body mass index is 19.47 kg/(m^2).  Generalized: Well developed, in no acute distress   Neurological examination  Mentation: Alert oriented to time, place, history taking. Follows all commands speech and language fluent. Masking of the face.  Cranial nerve II-XII: Pupils were equal round reactive to light. Extraocular movements were full, visual field were full on confrontational test. Facial sensation and strength were normal. Uvula tongue midline. Head turning and shoulder shrug  were normal and symmetric. Motor: The motor testing reveals 5 over 5 strength of all 4 extremities. Good symmetric motor tone is noted throughout. Tone is moderately rigid with cogwheeling in the right upper extremity. Sensory: Sensory testing is intact to soft touch on all 4 extremities. No evidence of extinction is noted.  Coordination: Cerebellar testing reveals good finger-nose-finger and heel-to-Foster bilaterally.   Gait and station: Patient is able to stand from a sitting position without assistance. He has a slightly stooped posture. Absent arm swing on the right. Good turns taking 2 steps. Tandem gait is slightly unsteady. Romberg is negative. No drift is seen.  Reflexes: Deep tendon reflexes are symmetric and normal bilaterally.    DIAGNOSTIC DATA (LABS, IMAGING, TESTING) - I reviewed patient records, labs, notes, testing and imaging myself where available.  Lab Results  Component Value Date   WBC 8.6 06/18/2013   HGB 13.5* 06/18/2013   HCT 42.8* 06/18/2013   MCV 93.8 06/18/2013   PLT 207 09/11/2011      Component Value Date/Time   NA 136 06/18/2013 0834   K 4.6 06/18/2013 0834   CL 100 06/18/2013 0834   CO2 30 06/18/2013 0834   GLUCOSE  99 06/18/2013 0834  BUN 13 06/18/2013 0834   CREATININE 1.05 06/18/2013 0834   CALCIUM 9.6 06/18/2013 0834   PROT 7.1 02/08/2013 0844   ALBUMIN 4.3 02/08/2013 0844   AST 43* 02/08/2013 0844   ALT <8 02/08/2013 0844   ALKPHOS 89 02/08/2013 0844   BILITOT 0.6 02/08/2013 0844   Lab Results  Component Value Date   CHOL 196 02/08/2013   HDL 67 02/08/2013   LDLCALC 116* 02/08/2013   TRIG 64 02/08/2013   CHOLHDL 2.9 02/08/2013    Lab Results  Component Value Date   TSH 1.688 02/08/2013      ASSESSMENT AND PLAN 68 y.o. year old male  has a past medical history of Thyroid disease; Allergy; Depression; Parkinson's disease; Anemia; and Parkinson's disease (06/05/2012). here with:  1. Parkinson's disease  Overall the patient has remained stable. He should continue to take Sinemet, Azilect and Requip. He denies needing a new prescription. I have encouraged patient to remain active. The patient's blood pressure has improved. He is no longer having any of the syncopal episodes. He is having some trouble with swallowing. He often chokes on solids and liquids. I have suggested that we complete a barium swallowing test. However he has deferred at this time. I have advised him that if his swallowing worsens he should let us know. Otherwise the patient will followup in 6 months or sooner if needed.  Ward Givens, MSN, NP-C 11/13/2013, 11:46 AM [facility] [address], [place], [place] [address] [phone]  Note: This document was prepared with digital dictation and possible smart phrase technology. Any transcriptional errors that result from this process are unintentional.  I reviewed the above note and documentation by the Nurse Practitioner and agree with the history, physical exam, assessment and plan as outlined above. Star Age, MD, PhD [facility])

## 2013-11-14 ENCOUNTER — Ambulatory Visit (INDEPENDENT_AMBULATORY_CARE_PROVIDER_SITE_OTHER): Payer: Medicare Other

## 2013-11-14 ENCOUNTER — Ambulatory Visit (INDEPENDENT_AMBULATORY_CARE_PROVIDER_SITE_OTHER): Payer: Medicare Other | Admitting: Emergency Medicine

## 2013-11-14 VITALS — BP 120/70 | HR 67 | Temp 97.4°F | Resp 16 | Ht 67.5 in | Wt 125.4 lb

## 2013-11-14 DIAGNOSIS — R079 Chest pain, unspecified: Secondary | ICD-10-CM

## 2013-11-14 DIAGNOSIS — R0781 Pleurodynia: Secondary | ICD-10-CM

## 2013-11-14 MED ORDER — HYDROCODONE-ACETAMINOPHEN 5-325 MG PO TABS: ORAL_TABLET | ORAL | Status: DC

## 2013-11-14 NOTE — Patient Instructions (Signed)

## 2013-11-14 NOTE — Addendum Note (Signed)
Addended by: Sunday Spillers on: 11/14/2013 05:33 PM   Modules accepted: Level of Service

## 2013-11-14 NOTE — Progress Notes (Addendum)
   Subjective:    Patient ID: [name], male    DOB: 12-31-1945, 68 y.o.   MRN: [id number]  This chart was scribed for Arlyss Queen, MD by Edison Simon, ED Scribe. This patient was seen in room 2 and the patient's care was started at 4:03 PM.   HPI   HPI Comments: [name] is a 68 y.o. male who presents to the [facility] complaining of sharp, stabbing left chest pain, he states he thinks he cracked a rib. He states he helped his neighbor move furniture but denies a fall or feeling anything while moving. He reports pain began while laying in bed that night. He reports history of Parkinson's disease, which is followed by Dr. Toy Care and has been doing well. He denies SOB.  Review of Systems  Respiratory: Negative for shortness of breath.   Cardiovascular: Positive for chest pain.       Objective:   Physical Exam CONSTITUTIONAL: Well developed/well nourished HEAD: Normocephalic/atraumatic EYES: EOMI/PERRL ENMT: Mucous membranes moist NECK: supple no meningeal signs SPINE:entire spine nontender CV: S1/S2 noted, no murmurs/rubs/gallops noted LUNGS/CHEST: Lungs are clear to auscultation bilaterally, no apparent distress, tender over left anterior chest at the costochondral junctions of the 4th-8th ribs ABDOMEN: soft, nontender, no rebound or guarding GU:no cva tenderness NEURO: Pt is awake/alert, moves all extremitiesx4 EXTREMITIES: pulses normal, full ROM SKIN: warm, color normal PSYCH: no abnormalities of mood noted [facility] reading (PRIMARY) by  Dr.Janautica Netzley is atelectasis with a small amount of fluid in the left base I did not see a definite fracture     Assessment & Plan:  Radiologist atelectasis in the left base with a small amount of fluid. No true fractures were seen we'll treat with Ultram for pain.. I personally performed the services described in this documentation, which was scribed in my presence. The recorded information has been reviewed and is  accurate.

## 2014-01-29 ENCOUNTER — Telehealth: Payer: Self-pay

## 2014-01-29 NOTE — Telephone Encounter (Signed)
LMVM for pt to get his flu shot.

## 2014-03-02 ENCOUNTER — Other Ambulatory Visit: Payer: Self-pay | Admitting: Internal Medicine

## 2014-03-03 ENCOUNTER — Other Ambulatory Visit: Payer: Self-pay | Admitting: Internal Medicine

## 2014-03-05 ENCOUNTER — Other Ambulatory Visit: Payer: Self-pay | Admitting: Internal Medicine

## 2014-03-08 ENCOUNTER — Encounter: Payer: Self-pay | Admitting: Internal Medicine

## 2014-03-09 ENCOUNTER — Other Ambulatory Visit: Payer: Self-pay | Admitting: *Deleted

## 2014-03-09 MED ORDER — LEVOTHYROXINE SODIUM 50 MCG PO TABS: 50.0000 ug | ORAL_TABLET | ORAL | Status: DC

## 2014-03-09 NOTE — Telephone Encounter (Signed)
Mychart email asking to refill medication- pt is due for a follow up appt. Responded via mychart.

## 2014-03-09 NOTE — Telephone Encounter (Signed)
Please call patient regarding his levothyroxine  50 MG   He takes two of the same 75 mg and 50mg    Needs the 50 mG    Patient has parkinson and  Difficulty speaking at times.   [phone]

## 2014-03-09 NOTE — Telephone Encounter (Signed)
[name] already sent in RF and notified pt on MyChart message.

## 2014-03-12 ENCOUNTER — Ambulatory Visit (INDEPENDENT_AMBULATORY_CARE_PROVIDER_SITE_OTHER): Payer: Medicare Other | Admitting: Family Medicine

## 2014-03-12 ENCOUNTER — Encounter: Payer: Self-pay | Admitting: Family Medicine

## 2014-03-12 VITALS — BP 112/66 | HR 72 | Temp 98.8°F | Resp 16 | Ht 67.0 in | Wt 123.0 lb

## 2014-03-12 DIAGNOSIS — I7300 Raynaud's syndrome without gangrene: Secondary | ICD-10-CM | POA: Diagnosis not present

## 2014-03-12 DIAGNOSIS — J069 Acute upper respiratory infection, unspecified: Secondary | ICD-10-CM | POA: Diagnosis not present

## 2014-03-12 DIAGNOSIS — R05 Cough: Secondary | ICD-10-CM | POA: Diagnosis not present

## 2014-03-12 DIAGNOSIS — R059 Cough, unspecified: Secondary | ICD-10-CM

## 2014-03-12 DIAGNOSIS — E039 Hypothyroidism, unspecified: Secondary | ICD-10-CM

## 2014-03-12 LAB — TSH: TSH: 1.908 u[IU]/mL (ref 0.350–4.500)

## 2014-03-12 MED ORDER — LEVOTHYROXINE SODIUM 50 MCG PO TABS: 50.0000 ug | ORAL_TABLET | ORAL | Status: DC

## 2014-03-12 MED ORDER — LEVOTHYROXINE SODIUM 75 MCG PO TABS: 75.0000 ug | ORAL_TABLET | ORAL | Status: DC

## 2014-03-12 MED ORDER — HYDROCODONE-HOMATROPINE 5-1.5 MG/5ML PO SYRP: ORAL_SOLUTION | ORAL | Status: DC

## 2014-03-12 NOTE — Progress Notes (Signed)
Subjective:  This chart was scribed for Travis Ray, MD by Randa Evens, ED Scribe. This Patient was seen in room 08 and the patients care was started at 9:16 AM   Patient ID: [name], male    DOB: 08/29/1945, 69 y.o.   MRN: [id number]  Chief Complaint  Patient presents with  . Illness    Cough, ST, congestion x 2 days  . Hypothyroidism    Wants thyroid check    HPI HPI Comments: [name] is a 69 y.o. male who presents to the [facility] complaining of cough onset 3 days prior. Pt states he has associated rhinorrhea, congestion and fatigue. Pt states that he has tried mucinex with no relief. Pt states that he has not had his flu vaccination today. Pt denies fever.    Pt of dr guest . With Hx of parkinson's disease. Who I last saw in feb 2014.  Pt is here for check of thyroid function. E-mail reviewed in system and was some questions on dose of synthroid he should take. Pt asked if he is suppose to be on 75 mcg or 50 mcg every other day. Pt's last TSH was December 2014 which was normal. Pt states that he has been alternating between the 75 mcg and 50 mcg each day. Pt states that he recently ran out of synthroid 2 days ago and has missed his last 2 doses.    Pt also states that when is hands get cold that the tips of his finger turn blue for the past 4 months. Pt states that he is due for a physical on April 16, 2014.     Patient Active Problem List   Diagnosis Date Noted  . Syncope 11/11/2012  . Autonomic postural hypotension 11/11/2012  . Parkinson's disease 06/05/2012  . Restless leg syndrome 11/15/2011  . Hypothyroid 09/11/2011  . Parkinsonism 04/02/2011  . Depression 04/02/2011   Past Medical History  Diagnosis Date  . Thyroid disease   . Allergy   . Depression   . Parkinson's disease   . Anemia   . Parkinson's disease 06/05/2012   Past Surgical History  Procedure Laterality Date  . Mandible fracture surgery    . Hernia  repair    . Wrist fracture surgery    . Rotator cuff repair    . Tonsillectomy    . Fracture surgery      jaw  . Vasectomy    . Appendectomy     Allergies  Allergen Reactions  . Ibuprofen Other (See Comments)    Blood in stool.  . Lamictal [Lamotrigine] Other (See Comments)    Blisters in mouth  . Nsaids   . Penicillins    Prior to Admission medications   Medication Sig Start Date End Date Taking? Authorizing Provider  aspirin 81 MG tablet Take 160 mg by mouth daily.   Yes Historical Provider, MD  Biotin (BIOTIN 5000) 5 MG CAPS Take 1 capsule by mouth daily.   Yes Historical Provider, MD  carbidopa-levodopa (SINEMET IR) 25-100 MG per tablet TAKE 1 TABLET BY MOUTH THREE TIMES DAILY 12/27/12  Yes Star Age, MD  levothyroxine (SYNTHROID, LEVOTHROID) 50 MCG tablet Take 1 tablet (50 mcg total) by mouth every other day. 03/09/14  Yes Chelle S Jeffery, PA-C  levothyroxine (SYNTHROID, LEVOTHROID) 75 MCG tablet TAKE 1 TABLET BY MOUTH EVERY OTHER DAY 03/05/14  Yes Darlyne Russian, MD  Multiple Vitamins-Minerals (MULTIVITAMIN WITH MINERALS) tablet Take 1 tablet by  mouth daily.   Yes Historical Provider, MD  rasagiline (AZILECT) 1 MG TABS tablet TAKE 1 TABLET BY MOUTH EVERY DAY 05/12/13  Yes Star Age, MD  rOPINIRole (REQUIP) 2 MG tablet TAKE 1/2 TABLET BY MOUTH AT 6:30AM, 1/2 TABLET BY MOUTH AT NOON, 1/2 TABLET BY MOUTH AT 5PM, AND 1 1/2 TABLETS AT 9PM 05/12/13  Yes Star Age, MD   History   Social History  . Marital Status: Married    Spouse Name: [name]    Number of Children: 2  . Years of Education: 12+   Occupational History  . DISABILITY     due to Parkinson's   Social History Main Topics  . Smoking status: Never Smoker   . Smokeless tobacco: Never Used  . Alcohol Use: 0.0 - 4.2 oz/week    0-7 Cans of beer per week  . Drug Use: No  . Sexual Activity: No   Other Topics Concern  . Not on file   Social History Narrative   Patient is married with 2 children.   Patient is  right handed.   Patient has high school education.   Patient drinks 1-2 cups daily.    Review of Systems  Constitutional: Negative for fever.  HENT: Positive for congestion and rhinorrhea.   Respiratory: Positive for cough.      Objective:   BP 112/66 mmHg  Pulse 72  Temp(Src) 98.8 F (37.1 C)  Resp 16  Ht 5\' 7"  (1.702 m)  Wt 123 lb (55.792 kg)  BMI 19.26 kg/m2  SpO2 98%   Physical Exam  Constitutional: He is oriented to person, place, and time. He appears well-developed and well-nourished.  HENT:  Head: Normocephalic and atraumatic.  Right Ear: Tympanic membrane, external ear and ear canal normal.  Left Ear: Tympanic membrane, external ear and ear canal normal.  Nose: No rhinorrhea.  Mouth/Throat: Oropharynx is clear and moist and mucous membranes are normal. No oropharyngeal exudate or posterior oropharyngeal erythema.  Eyes: Conjunctivae are normal. Pupils are equal, round, and reactive to light.  Neck: Neck supple.  No nodularity or apparent enlargement of thyroid palpated.    Cardiovascular: Normal rate, regular rhythm, normal heart sounds and intact distal pulses.   No murmur heard. Pulmonary/Chest: Effort normal and breath sounds normal. He has no wheezes. He has no rhonchi. He has no rales.  Abdominal: Soft. There is no tenderness.  Lymphadenopathy:    He has no cervical adenopathy.  Neurological: He is alert and oriented to person, place, and time.  Skin: Skin is warm and dry. No rash noted.  No rash, finger tips warm no change in color, NVI distally.   Psychiatric: He has a normal mood and affect. His behavior is normal.  Vitals reviewed.     Assessment & Plan:   [name] is a 69 y.o. male Hypothyroidism, unspecified hypothyroidism type - Plan: TSH, levothyroxine (SYNTHROID, LEVOTHROID) 75 MCG tablet, levothyroxine (SYNTHROID, LEVOTHROID) 50 MCG tablet  - alternates b/t 50 and 68mcg.  Off meds for 2 days - may affect TSH.  Refilled for #90, but  if levels off - repeat tsh in next 6 weeks.   Acute upper respiratory infection  -sx care as in AVS.  Suspected viral URI, reassuring exam.   -Cough - Plan: HYDROcodone-homatropine (HYCODAN) 5-1.5 MG/5ML syrup if needed - SED. Fall/sedation precautions.   Raynaud's phenomenon - gloves and avoidance of cold to affected areas. Info on AVS. Can discuss further at upcoming CPE, but would hold on CCB  at this time. Consider if persistent.    Meds ordered this encounter  Medications  . levothyroxine (SYNTHROID, LEVOTHROID) 75 MCG tablet    Sig: Take 1 tablet (75 mcg total) by mouth every other day.    Dispense:  90 tablet    Refill:  0  . levothyroxine (SYNTHROID, LEVOTHROID) 50 MCG tablet    Sig: Take 1 tablet (50 mcg total) by mouth every other day.    Dispense:  90 tablet    Refill:  0  . HYDROcodone-homatropine (HYCODAN) 5-1.5 MG/5ML syrup    Sig: 84m by mouth a bedtime as needed for cough.    Dispense:  120 mL    Refill:  0   Patient Instructions  Saline nasal spray atleast 4 times per day, over the counter mucinex or mucinex DM during the day, drink plenty of fluids.  Tylenol if needed. If cough syrup needed at night to sleep - can use hydrocodone cough syrup as discussed. You should receive a call or letter about your lab results within the next week to 10 days. As you have been off meds for 2 days, may need to repeat thyroid test in next 6 weeks on usual dose of these meds. Wear gloves and keep hands warm to decrease amount of color change. Can talk more about this at your upcoming physical. Return to the clinic or go to the nearest emergency room if any of your symptoms worsen or new symptoms occur.    Upper Respiratory Infection, Adult An upper respiratory infection (URI) is also sometimes known as the common cold. The upper respiratory tract includes the nose, sinuses, throat, trachea, and bronchi. Bronchi are the airways leading to the lungs. Most people improve within 1 week,  but symptoms can last up to 2 weeks. A residual cough may last even longer.  CAUSES Many different viruses can infect the tissues lining the upper respiratory tract. The tissues become irritated and inflamed and often become very moist. Mucus production is also common. A cold is contagious. You can easily spread the virus to others by oral contact. This includes kissing, sharing a glass, coughing, or sneezing. Touching your mouth or nose and then touching a surface, which is then touched by another person, can also spread the virus. SYMPTOMS  Symptoms typically develop 1 to 3 days after you come in contact with a cold virus. Symptoms vary from person to person. They may include:  Runny nose.  Sneezing.  Nasal congestion.  Sinus irritation.  Sore throat.  Loss of voice (laryngitis).  Cough.  Fatigue.  Muscle aches.  Loss of appetite.  Headache.  Low-grade fever. DIAGNOSIS  You might diagnose your own cold based on familiar symptoms, since most people get a cold 2 to 3 times a year. Your caregiver can confirm this based on your exam. Most importantly, your caregiver can check that your symptoms are not due to another disease such as strep throat, sinusitis, pneumonia, asthma, or epiglottitis. Blood tests, throat tests, and X-rays are not necessary to diagnose a common cold, but they may sometimes be helpful in excluding other more serious diseases. Your caregiver will decide if any further tests are required. RISKS AND COMPLICATIONS  You may be at risk for a more severe case of the common cold if you smoke cigarettes, have chronic heart disease (such as heart failure) or lung disease (such as asthma), or if you have a weakened immune system. The very young and very old are also at risk for  more serious infections. Bacterial sinusitis, middle ear infections, and bacterial pneumonia can complicate the common cold. The common cold can worsen asthma and chronic obstructive pulmonary disease  (COPD). Sometimes, these complications can require emergency medical care and may be life-threatening. PREVENTION  The best way to protect against getting a cold is to practice good hygiene. Avoid oral or hand contact with people with cold symptoms. Wash your hands often if contact occurs. There is no clear evidence that vitamin C, vitamin E, echinacea, or exercise reduces the chance of developing a cold. However, it is always recommended to get plenty of rest and practice good nutrition. TREATMENT  Treatment is directed at relieving symptoms. There is no cure. Antibiotics are not effective, because the infection is caused by a virus, not by bacteria. Treatment may include:  Increased fluid intake. Sports drinks offer valuable electrolytes, sugars, and fluids.  Breathing heated mist or steam (vaporizer or shower).  Eating chicken soup or other clear broths, and maintaining good nutrition.  Getting plenty of rest.  Using gargles or lozenges for comfort.  Controlling fevers with ibuprofen or acetaminophen as directed by your caregiver.  Increasing usage of your inhaler if you have asthma. Zinc gel and zinc lozenges, taken in the first 24 hours of the common cold, can shorten the duration and lessen the severity of symptoms. Pain medicines may help with fever, muscle aches, and throat pain. A variety of non-prescription medicines are available to treat congestion and runny nose. Your caregiver can make recommendations and may suggest nasal or lung inhalers for other symptoms.  HOME CARE INSTRUCTIONS   Only take over-the-counter or prescription medicines for pain, discomfort, or fever as directed by your caregiver.  Use a warm mist humidifier or inhale steam from a shower to increase air moisture. This may keep secretions moist and make it easier to breathe.  Drink enough water and fluids to keep your urine clear or pale yellow.  Rest as needed.  Return to work when your temperature has  returned to normal or as your caregiver advises. You may need to stay home longer to avoid infecting others. You can also use a face mask and careful hand washing to prevent spread of the virus. SEEK MEDICAL CARE IF:   After the first few days, you feel you are getting worse rather than better.  You need your caregiver's advice about medicines to control symptoms.  You develop chills, worsening shortness of breath, or brown or red sputum. These may be signs of pneumonia.  You develop yellow or brown nasal discharge or pain in the face, especially when you bend forward. These may be signs of sinusitis.  You develop a fever, swollen neck glands, pain with swallowing, or white areas in the back of your throat. These may be signs of strep throat. SEEK IMMEDIATE MEDICAL CARE IF:   You have a fever.  You develop severe or persistent headache, ear pain, sinus pain, or chest pain.  You develop wheezing, a prolonged cough, cough up blood, or have a change in your usual mucus (if you have chronic lung disease).  You develop sore muscles or a stiff neck. Document Released: 08/02/2000 Document Revised: 05/01/2011 Document Reviewed: 05/14/2013 [facility] Patient Information 2015 [place], [place]. This information is not intended to replace advice given to you by your health care provider. Make sure you discuss any questions you have with your health care provider.  Raynaud's Syndrome Raynaud's Syndrome is a disorder of the blood vessels in your hands and  feet. It occurs when small arteries of the arms/hands or legs/feet become sensitive to cold or emotional upset. This causes the arteries to constrict, or narrow, and reduces blood flow to the area. The color in the fingers or toes changes from white to bluish to red and this is not usually painful. There may be numbness and tingling. Sores on the skin (ulcers) can form. Symptoms are usually relieved by warming. HOME CARE INSTRUCTIONS   Avoid exposure to  cold. Keep your whole body warm and dry. Dress in layers. Wear mittens or gloves when handling ice or frozen food and when outdoors. Use holders for glasses or cans containing cold drinks. If possible, stay indoors during cold weather.  Limit your use of caffeine. Switch to decaffeinated coffee, tea, and soda pop. Avoid chocolate.  Avoid smoking or being around cigarette smoke. Smoke will make symptoms worse.  Wear loose fitting socks and comfortable, roomy shoes.  Avoid vibrating tools and machinery.  If possible, avoid stressful and emotional situations. Exercise, meditation and yoga may help you cope with stress. Biofeedback may be useful.  Ask your caregiver about medicine (calcium channel blockers) that may control Raynaud's phenomena. SEEK MEDICAL CARE IF:   Your discomfort becomes worse, despite conservative treatment.  You develop sores on your fingers and toes that do not heal. Document Released: 02/04/2000 Document Revised: 05/01/2011 Document Reviewed: 02/11/2008 [facility] Patient Information 2015 [place], [place]. This information is not intended to replace advice given to you by your health care provider. Make sure you discuss any questions you have with your health care provider.     I personally performed the services described in this documentation, which was scribed in my presence. The recorded information has been reviewed and considered, and addended by me as needed.

## 2014-03-12 NOTE — Patient Instructions (Addendum)
Saline nasal spray atleast 4 times per day, over the counter mucinex or mucinex DM during the day, drink plenty of fluids.  Tylenol if needed. If cough syrup needed at night to sleep - can use hydrocodone cough syrup as discussed. You should receive a call or letter about your lab results within the next week to 10 days. As you have been off meds for 2 days, may need to repeat thyroid test in next 6 weeks on usual dose of these meds. Wear gloves and keep hands warm to decrease amount of color change. Can talk more about this at your upcoming physical. Return to the clinic or go to the nearest emergency room if any of your symptoms worsen or new symptoms occur.    Upper Respiratory Infection, Adult An upper respiratory infection (URI) is also sometimes known as the common cold. The upper respiratory tract includes the nose, sinuses, throat, trachea, and bronchi. Bronchi are the airways leading to the lungs. Most people improve within 1 week, but symptoms can last up to 2 weeks. A residual cough may last even longer.  CAUSES Many different viruses can infect the tissues lining the upper respiratory tract. The tissues become irritated and inflamed and often become very moist. Mucus production is also common. A cold is contagious. You can easily spread the virus to others by oral contact. This includes kissing, sharing a glass, coughing, or sneezing. Touching your mouth or nose and then touching a surface, which is then touched by another person, can also spread the virus. SYMPTOMS  Symptoms typically develop 1 to 3 days after you come in contact with a cold virus. Symptoms vary from person to person. They may include:  Runny nose.  Sneezing.  Nasal congestion.  Sinus irritation.  Sore throat.  Loss of voice (laryngitis).  Cough.  Fatigue.  Muscle aches.  Loss of appetite.  Headache.  Low-grade fever. DIAGNOSIS  You might diagnose your own cold based on familiar symptoms, since  most people get a cold 2 to 3 times a year. Your caregiver can confirm this based on your exam. Most importantly, your caregiver can check that your symptoms are not due to another disease such as strep throat, sinusitis, pneumonia, asthma, or epiglottitis. Blood tests, throat tests, and X-rays are not necessary to diagnose a common cold, but they may sometimes be helpful in excluding other more serious diseases. Your caregiver will decide if any further tests are required. RISKS AND COMPLICATIONS  You may be at risk for a more severe case of the common cold if you smoke cigarettes, have chronic heart disease (such as heart failure) or lung disease (such as asthma), or if you have a weakened immune system. The very young and very old are also at risk for more serious infections. Bacterial sinusitis, middle ear infections, and bacterial pneumonia can complicate the common cold. The common cold can worsen asthma and chronic obstructive pulmonary disease (COPD). Sometimes, these complications can require emergency medical care and may be life-threatening. PREVENTION  The best way to protect against getting a cold is to practice good hygiene. Avoid oral or hand contact with people with cold symptoms. Wash your hands often if contact occurs. There is no clear evidence that vitamin C, vitamin E, echinacea, or exercise reduces the chance of developing a cold. However, it is always recommended to get plenty of rest and practice good nutrition. TREATMENT  Treatment is directed at relieving symptoms. There is no cure. Antibiotics are not effective, because the  infection is caused by a virus, not by bacteria. Treatment may include:  Increased fluid intake. Sports drinks offer valuable electrolytes, sugars, and fluids.  Breathing heated mist or steam (vaporizer or shower).  Eating chicken soup or other clear broths, and maintaining good nutrition.  Getting plenty of rest.  Using gargles or lozenges for  comfort.  Controlling fevers with ibuprofen or acetaminophen as directed by your caregiver.  Increasing usage of your inhaler if you have asthma. Zinc gel and zinc lozenges, taken in the first 24 hours of the common cold, can shorten the duration and lessen the severity of symptoms. Pain medicines may help with fever, muscle aches, and throat pain. A variety of non-prescription medicines are available to treat congestion and runny nose. Your caregiver can make recommendations and may suggest nasal or lung inhalers for other symptoms.  HOME CARE INSTRUCTIONS   Only take over-the-counter or prescription medicines for pain, discomfort, or fever as directed by your caregiver.  Use a warm mist humidifier or inhale steam from a shower to increase air moisture. This may keep secretions moist and make it easier to breathe.  Drink enough water and fluids to keep your urine clear or pale yellow.  Rest as needed.  Return to work when your temperature has returned to normal or as your caregiver advises. You may need to stay home longer to avoid infecting others. You can also use a face mask and careful hand washing to prevent spread of the virus. SEEK MEDICAL CARE IF:   After the first few days, you feel you are getting worse rather than better.  You need your caregiver's advice about medicines to control symptoms.  You develop chills, worsening shortness of breath, or brown or red sputum. These may be signs of pneumonia.  You develop yellow or brown nasal discharge or pain in the face, especially when you bend forward. These may be signs of sinusitis.  You develop a fever, swollen neck glands, pain with swallowing, or white areas in the back of your throat. These may be signs of strep throat. SEEK IMMEDIATE MEDICAL CARE IF:   You have a fever.  You develop severe or persistent headache, ear pain, sinus pain, or chest pain.  You develop wheezing, a prolonged cough, cough up blood, or have a  change in your usual mucus (if you have chronic lung disease).  You develop sore muscles or a stiff neck. Document Released: 08/02/2000 Document Revised: 05/01/2011 Document Reviewed: 05/14/2013 [facility] Patient Information 2015 [place], [place]. This information is not intended to replace advice given to you by your health care provider. Make sure you discuss any questions you have with your health care provider.  Raynaud's Syndrome Raynaud's Syndrome is a disorder of the blood vessels in your hands and feet. It occurs when small arteries of the arms/hands or legs/feet become sensitive to cold or emotional upset. This causes the arteries to constrict, or narrow, and reduces blood flow to the area. The color in the fingers or toes changes from white to bluish to red and this is not usually painful. There may be numbness and tingling. Sores on the skin (ulcers) can form. Symptoms are usually relieved by warming. HOME CARE INSTRUCTIONS   Avoid exposure to cold. Keep your whole body warm and dry. Dress in layers. Wear mittens or gloves when handling ice or frozen food and when outdoors. Use holders for glasses or cans containing cold drinks. If possible, stay indoors during cold weather.  Limit your use of  caffeine. Switch to decaffeinated coffee, tea, and soda pop. Avoid chocolate.  Avoid smoking or being around cigarette smoke. Smoke will make symptoms worse.  Wear loose fitting socks and comfortable, roomy shoes.  Avoid vibrating tools and machinery.  If possible, avoid stressful and emotional situations. Exercise, meditation and yoga may help you cope with stress. Biofeedback may be useful.  Ask your caregiver about medicine (calcium channel blockers) that may control Raynaud's phenomena. SEEK MEDICAL CARE IF:   Your discomfort becomes worse, despite conservative treatment.  You develop sores on your fingers and toes that do not heal. Document Released: 02/04/2000 Document Revised:  05/01/2011 Document Reviewed: 02/11/2008 [facility] Patient Information 2015 [place], [place]. This information is not intended to replace advice given to you by your health care provider. Make sure you discuss any questions you have with your health care provider.

## 2014-03-23 ENCOUNTER — Other Ambulatory Visit: Payer: Self-pay | Admitting: Neurology

## 2014-03-30 ENCOUNTER — Observation Stay (HOSPITAL_COMMUNITY)
Admission: EM | Admit: 2014-03-30 | Discharge: 2014-04-01 | Disposition: A | Discharge status: Home / Self Care | Payer: Medicare Other | Attending: Internal Medicine | Admitting: Internal Medicine

## 2014-03-30 ENCOUNTER — Ambulatory Visit (INDEPENDENT_AMBULATORY_CARE_PROVIDER_SITE_OTHER): Payer: Medicare Other | Admitting: Emergency Medicine

## 2014-03-30 ENCOUNTER — Encounter (HOSPITAL_COMMUNITY): Payer: Self-pay | Admitting: *Deleted

## 2014-03-30 ENCOUNTER — Emergency Department (HOSPITAL_COMMUNITY): Payer: Medicare Other

## 2014-03-30 VITALS — BP 94/62 | HR 54 | Temp 97.9°F | Resp 16 | Ht 67.0 in | Wt 127.6 lb

## 2014-03-30 DIAGNOSIS — R42 Dizziness and giddiness: Secondary | ICD-10-CM | POA: Diagnosis not present

## 2014-03-30 DIAGNOSIS — R55 Syncope and collapse: Secondary | ICD-10-CM

## 2014-03-30 DIAGNOSIS — E039 Hypothyroidism, unspecified: Secondary | ICD-10-CM | POA: Diagnosis not present

## 2014-03-30 DIAGNOSIS — Z7982 Long term (current) use of aspirin: Secondary | ICD-10-CM | POA: Insufficient documentation

## 2014-03-30 DIAGNOSIS — Z886 Allergy status to analgesic agent status: Secondary | ICD-10-CM | POA: Insufficient documentation

## 2014-03-30 DIAGNOSIS — Z1329 Encounter for screening for other suspected endocrine disorder: Secondary | ICD-10-CM | POA: Diagnosis not present

## 2014-03-30 DIAGNOSIS — Z79899 Other long term (current) drug therapy: Secondary | ICD-10-CM | POA: Insufficient documentation

## 2014-03-30 DIAGNOSIS — F329 Major depressive disorder, single episode, unspecified: Secondary | ICD-10-CM | POA: Insufficient documentation

## 2014-03-30 DIAGNOSIS — G4700 Insomnia, unspecified: Secondary | ICD-10-CM | POA: Insufficient documentation

## 2014-03-30 DIAGNOSIS — Z880 Allergy status to penicillin: Secondary | ICD-10-CM | POA: Diagnosis not present

## 2014-03-30 DIAGNOSIS — G20 Parkinson's disease: Secondary | ICD-10-CM | POA: Diagnosis not present

## 2014-03-30 DIAGNOSIS — D649 Anemia, unspecified: Secondary | ICD-10-CM | POA: Diagnosis not present

## 2014-03-30 DIAGNOSIS — R404 Transient alteration of awareness: Secondary | ICD-10-CM | POA: Diagnosis not present

## 2014-03-30 DIAGNOSIS — Z888 Allergy status to other drugs, medicaments and biological substances status: Secondary | ICD-10-CM | POA: Insufficient documentation

## 2014-03-30 DIAGNOSIS — S060X9A Concussion with loss of consciousness of unspecified duration, initial encounter: Secondary | ICD-10-CM | POA: Diagnosis not present

## 2014-03-30 DIAGNOSIS — R4020 Unspecified coma: Secondary | ICD-10-CM | POA: Diagnosis not present

## 2014-03-30 LAB — I-STAT TROPONIN, ED: TROPONIN I, POC: 0 ng/mL (ref 0.00–0.08)

## 2014-03-30 LAB — CBC
HCT: 38.7 % — ABNORMAL LOW (ref 39.0–52.0)
HEMOGLOBIN: 12.8 g/dL — AB (ref 13.0–17.0)
MCH: 30.3 pg (ref 26.0–34.0)
MCHC: 33.1 g/dL (ref 30.0–36.0)
MCV: 91.7 fL (ref 78.0–100.0)
PLATELETS: 214 10*3/uL (ref 150–400)
RBC: 4.22 MIL/uL (ref 4.22–5.81)
RDW: 14.2 % (ref 11.5–15.5)
WBC: 4.5 10*3/uL (ref 4.0–10.5)

## 2014-03-30 LAB — BASIC METABOLIC PANEL
Anion gap: 6 (ref 5–15)
BUN: 17 mg/dL (ref 6–23)
CHLORIDE: 111 mmol/L (ref 96–112)
CO2: 27 mmol/L (ref 19–32)
Calcium: 9.4 mg/dL (ref 8.4–10.5)
Creatinine, Ser: 0.95 mg/dL (ref 0.50–1.35)
GFR calc Af Amer: >90 mL/min (ref >90)
GFR calc non Af Amer: 85 mL/min — ABNORMAL LOW (ref >90)
Glucose, Bld: 76 mg/dL (ref 70–99)
Potassium: 4.3 mmol/L (ref 3.5–5.1)
Sodium: 144 mmol/L (ref 135–145)

## 2014-03-30 LAB — URINALYSIS, ROUTINE W REFLEX MICROSCOPIC
BILIRUBIN URINE: NEGATIVE
Glucose, UA: NEGATIVE mg/dL
Hgb urine dipstick: NEGATIVE
Ketones, ur: NEGATIVE mg/dL
Leukocytes, UA: NEGATIVE
Nitrite: NEGATIVE
Protein, ur: NEGATIVE mg/dL
Specific Gravity, Urine: 1.02 (ref 1.005–1.030)
UROBILINOGEN UA: 0.2 mg/dL (ref 0.0–1.0)
pH: 5.5 (ref 5.0–8.0)

## 2014-03-30 LAB — GLUCOSE, POCT (MANUAL RESULT ENTRY): POC Glucose: 91 mg/dl (ref 70–99)

## 2014-03-30 LAB — TSH: TSH: 0.942 u[IU]/mL (ref 0.350–4.500)

## 2014-03-30 LAB — TROPONIN I: Troponin I: <0.03 ng/mL (ref <0.031)

## 2014-03-30 MED ORDER — ROPINIROLE HCL 1 MG PO TABS
2.0000 mg | ORAL_TABLET | Freq: Four times a day (QID) | ORAL | Status: DC
  Administered 2014-03-30 – 2014-04-01 (×8): 2 mg via ORAL
  Filled 2014-03-30 (×11): qty 2

## 2014-03-30 MED ORDER — SODIUM CHLORIDE 0.9 % IV SOLN: 250.0000 mL | INTRAVENOUS | Status: DC | PRN

## 2014-03-30 MED ORDER — GUAIFENESIN-DM 100-10 MG/5ML PO SYRP
5.0000 mL | ORAL_SOLUTION | ORAL | Status: DC | PRN
  Filled 2014-03-30: qty 5

## 2014-03-30 MED ORDER — ENOXAPARIN SODIUM 40 MG/0.4ML ~~LOC~~ SOLN
40.0000 mg | SUBCUTANEOUS | Status: DC
  Administered 2014-03-30 – 2014-03-31 (×2): 40 mg via SUBCUTANEOUS
  Filled 2014-03-30 (×4): qty 0.4

## 2014-03-30 MED ORDER — OXYCODONE HCL 5 MG PO TABS: 5.0000 mg | ORAL_TABLET | ORAL | Status: DC | PRN

## 2014-03-30 MED ORDER — ONDANSETRON HCL 4 MG PO TABS: 4.0000 mg | ORAL_TABLET | Freq: Four times a day (QID) | ORAL | Status: DC | PRN

## 2014-03-30 MED ORDER — ONDANSETRON HCL 4 MG/2ML IJ SOLN: 4.0000 mg | Freq: Four times a day (QID) | INTRAMUSCULAR | Status: DC | PRN

## 2014-03-30 MED ORDER — RASAGILINE MESYLATE 1 MG PO TABS
1.0000 mg | ORAL_TABLET | Freq: Every day | ORAL | Status: DC
  Administered 2014-03-31: 1 mg via ORAL
  Filled 2014-03-30 (×2): qty 1

## 2014-03-30 MED ORDER — SODIUM CHLORIDE 0.9 % IJ SOLN
3.0000 mL | Freq: Two times a day (BID) | INTRAMUSCULAR | Status: DC
  Administered 2014-03-30 – 2014-04-01 (×4): 3 mL via INTRAVENOUS

## 2014-03-30 MED ORDER — ACETAMINOPHEN 650 MG RE SUPP: 650.0000 mg | Freq: Four times a day (QID) | RECTAL | Status: DC | PRN

## 2014-03-30 MED ORDER — LEVOTHYROXINE SODIUM 50 MCG PO TABS
50.0000 ug | ORAL_TABLET | ORAL | Status: DC
  Administered 2014-03-31: 50 ug via ORAL
  Filled 2014-03-30 (×2): qty 1

## 2014-03-30 MED ORDER — ALBUTEROL SULFATE (2.5 MG/3ML) 0.083% IN NEBU: 2.5000 mg | INHALATION_SOLUTION | RESPIRATORY_TRACT | Status: DC | PRN

## 2014-03-30 MED ORDER — LEVOTHYROXINE SODIUM 75 MCG PO TABS
75.0000 ug | ORAL_TABLET | ORAL | Status: DC
  Administered 2014-04-01: 75 ug via ORAL
  Filled 2014-03-30: qty 1

## 2014-03-30 MED ORDER — ACETAMINOPHEN 325 MG PO TABS
650.0000 mg | ORAL_TABLET | Freq: Four times a day (QID) | ORAL | Status: DC | PRN
  Administered 2014-03-31: 650 mg via ORAL
  Filled 2014-03-30: qty 2

## 2014-03-30 MED ORDER — SODIUM CHLORIDE 0.9 % IJ SOLN: 3.0000 mL | INTRAMUSCULAR | Status: DC | PRN

## 2014-03-30 MED ORDER — CARBIDOPA-LEVODOPA 25-100 MG PO TABS
1.0000 | ORAL_TABLET | Freq: Three times a day (TID) | ORAL | Status: DC
  Administered 2014-03-30 – 2014-04-01 (×6): 1 via ORAL
  Filled 2014-03-30 (×8): qty 1

## 2014-03-30 MED ORDER — ASPIRIN EC 81 MG PO TBEC
160.0000 mg | DELAYED_RELEASE_TABLET | Freq: Every day | ORAL | Status: DC
  Administered 2014-03-31 – 2014-04-01 (×2): 162 mg via ORAL
  Filled 2014-03-30 (×2): qty 2

## 2014-03-30 MED ORDER — INFLUENZA VAC SPLIT QUAD 0.5 ML IM SUSY
0.5000 mL | PREFILLED_SYRINGE | INTRAMUSCULAR | Status: AC
  Administered 2014-03-31: 0.5 mL via INTRAMUSCULAR
  Filled 2014-03-30: qty 0.5

## 2014-03-30 MED ORDER — SODIUM CHLORIDE 0.9 % IJ SOLN
3.0000 mL | Freq: Two times a day (BID) | INTRAMUSCULAR | Status: DC
  Administered 2014-03-31: 3 mL via INTRAVENOUS

## 2014-03-30 NOTE — ED Provider Notes (Signed)
CSN: [id number]     Arrival date & time 03/30/14  1044 History   First MD Initiated Contact with Patient 03/30/14 1047     Chief Complaint  Patient presents with  . Loss of Consciousness     (Consider location/radiation/quality/duration/timing/severity/associated sxs/prior Treatment) HPI [name] is a 69 y.o. male history of Parkinson's disease comes in for evaluation of syncopal episode. Patient was seen at his primary care office this morning and sent to ED for further evaluation of syncopal episode. Patient states last night at approximately 6:00 he was sitting in his dining room chair working on a puzzle when he became very sweaty and passed out, falling to the floor. His wife was with him at their home when this occurred. She heard him fall and came to him and by that time he was getting back up. He reports the same thing happened again later that evening at approximately 11 PM when he walked from his bed to the refrigerator across the house and he reports once he got to the refrigerator he put his hand on the door, became very sweaty and his legs gave out and he fell to the floor once more. He denies any loss of consciousness during the second fall. He denies any headaches, changes in vision, chest pain, shortness of breath. Reports he is very active and walks 6 miles everyday to help combat his Parkinson's. Has never felt lightheaded or experience cp following walks. Denies urinary symptoms, rash He was last seen by his neurologist in September with no medication changes. Past Medical History  Diagnosis Date  . Thyroid disease   . Allergy   . Depression   . Parkinson's disease   . Anemia   . Parkinson's disease 06/05/2012   Past Surgical History  Procedure Laterality Date  . Mandible fracture surgery    . Hernia repair    . Wrist fracture surgery    . Rotator cuff repair    . Tonsillectomy    . Fracture surgery      jaw  . Vasectomy    . Appendectomy     Family History   Problem Relation Age of Onset  . Cancer Father     colon   History  Substance Use Topics  . Smoking status: Never Smoker   . Smokeless tobacco: Never Used  . Alcohol Use: 0.0 - 4.2 oz/week    0-7 Cans of beer per week    Review of Systems  All other systems reviewed and are negative.  A 10 point review of systems was completed and was negative except for pertinent positives and negatives as mentioned in the history of present illness     Allergies  Ibuprofen; Lamictal; Nsaids; and Penicillins  Home Medications   Prior to Admission medications   Medication Sig Start Date End Date Taking? Authorizing Provider  aspirin 81 MG tablet Take 160 mg by mouth daily.   Yes Historical Provider, MD  AZILECT 1 MG TABS tablet TAKE 1 TABLET BY MOUTH EVERY DAY 03/23/14  Yes Star Age, MD  Biotin (BIOTIN 5000) 5 MG CAPS Take 1 capsule by mouth daily.   Yes Historical Provider, MD  carbidopa-levodopa (SINEMET IR) 25-100 MG per tablet TAKE 1 TABLET BY MOUTH THREE TIMES DAILY 12/27/12  Yes Star Age, MD  levothyroxine (SYNTHROID, LEVOTHROID) 50 MCG tablet Take 1 tablet (50 mcg total) by mouth every other day. 03/12/14  Yes Wendie Agreste, MD  levothyroxine (SYNTHROID, LEVOTHROID) 75 MCG tablet Take 1  tablet (75 mcg total) by mouth every other day. 03/12/14  Yes Wendie Agreste, MD  Multiple Vitamins-Minerals (MULTIVITAMIN WITH MINERALS) tablet Take 1 tablet by mouth daily.   Yes Historical Provider, MD  rOPINIRole (REQUIP) 2 MG tablet TAKE 1/2 TABLET BY MOUTH AT 6:30AM, 1/2 TABLET BY MOUTH AT NOON, 1/2 TABLET BY MOUTH AT 5PM, AND 1 1/2 TABLETS AT 9PM 05/12/13  Yes Saima Athar, MD   BP 151/83 mmHg  Pulse 58  Temp(Src) 97.2 F (36.2 C)  Resp 15  SpO2 99% Physical Exam  Constitutional: He is oriented to person, place, and time. He appears well-developed and well-nourished.  HENT:  Head: Normocephalic and atraumatic.  Mouth/Throat: Oropharynx is clear and moist.  Eyes: Conjunctivae are  normal. Pupils are equal, round, and reactive to light. Right eye exhibits no discharge. Left eye exhibits no discharge. No scleral icterus.  Neck: Normal range of motion. Neck supple. No thyromegaly present.  Cardiovascular: Normal rate, regular rhythm and normal heart sounds.   Pulmonary/Chest: Effort normal and breath sounds normal. No respiratory distress. He has no wheezes. He has no rales.  Abdominal: Soft. There is no tenderness.  Musculoskeletal: He exhibits no tenderness.  Neurological: He is alert and oriented to person, place, and time.  Cranial Nerves II-XII grossly intact. Patient maintains motor and sensation 5/5 in all 4 extremities. Grip strength intact and equal bilaterally. Extraocular movements intact without nystagmus. Completes finger to nose hand coordination movements without difficulty.  Skin: Skin is warm and dry. No rash noted.  Psychiatric: He has a normal mood and affect.  Nursing note and vitals reviewed.   ED Course  Procedures (including critical care time) Labs Review Labs Reviewed  BASIC METABOLIC PANEL - Abnormal; Notable for the following:    GFR calc non Af Amer 85 (*)    All other components within normal limits  CBC - Abnormal; Notable for the following:    Hemoglobin 12.8 (*)    HCT 38.7 (*)    All other components within normal limits  TSH  URINALYSIS, ROUTINE W REFLEX MICROSCOPIC  I-STAT TROPOININ, ED    Imaging Review Ct Head Wo Contrast  03/30/2014   CLINICAL DATA:  Loss of consciousness with fall.  Initial encounter.  EXAM: CT HEAD WITHOUT CONTRAST  TECHNIQUE: Contiguous axial images were obtained from the base of the skull through the vertex without intravenous contrast.  COMPARISON:  10/31/2012  FINDINGS: Skull and Sinuses:Remote subcondylar fracture of the left mandible with chronic TMJ dislocation on the left. No acute fracture. No sinus or mastoid opacification.  Orbits: No acute abnormality.  Brain: No evidence of acute infarction,  hemorrhage, hydrocephalus, or mass lesion/mass effect.  IMPRESSION: 1. Negative intracranial imaging. 2. Remote subcondylar left mandible fracture with chronic TMJ dislocation.   Electronically Signed   By: Jorje Guild M.D.   On: 03/30/2014 12:58   Dg Chest Port 1 View  03/30/2014   CLINICAL DATA:  Loss of consciousness.  EXAM: PORTABLE CHEST - 1 VIEW  COMPARISON:  November 14, 2013.  FINDINGS: The heart size and mediastinal contours are within normal limits. Both lungs are clear. No pneumothorax or pleural effusion is noted. The visualized skeletal structures are unremarkable.  IMPRESSION: No acute cardiopulmonary abnormality seen.   Electronically Signed   By: Sabino Dick M.D.   On: 03/30/2014 11:56     EKG Interpretation   Date/Time:  Monday March 30 2014 14:51:59 EST Ventricular Rate:  60 PR Interval:  155 QRS Duration: 101  QT Interval:  400 QTC Calculation: 400 R Axis:   33 Text Interpretation:  Age not entered, assumed to be  69 years old for  purpose of ECG interpretation Sinus rhythm Probable left atrial  enlargement Anteroseptal infarct, age indeterminate Confirmed by DOCHERTY   MD, [place] [phone]) on 03/30/2014 3:11:11 PM     Meds given in ED:  Medications - No data to display  New Prescriptions   No medications on file   Filed Vitals:   03/30/14 1415 03/30/14 1430 03/30/14 1445 03/30/14 1500  BP: 137/84 136/93 148/91 151/83  Pulse: 58 57 62 58  Temp:      Resp: 17 14 14 15   SpO2: 100% 100% 100% 99%    MDM  Vitals stable - WNL -afebrile Pt resting comfortably in ED. Denies any symptoms or discomfort at this time. PE--normal neuro exam Labwork noncontributory, TSH within normal limits, troponin negative. EKG not concerning Imaging-chest x-ray shows no acute cardiopulmonary pathology, CT head shows no acute intracranial pathology  Discussed patient presentation with attending, Dr. Tawnya Crook Due to patient age, unexplained syncope and presyncope, we'll have  patient admitted for observation. Consult to IM, patient admitted.  Final diagnoses:  Syncope, unspecified syncope type        Verl Dicker, PA-C 03/31/14 1432  Ernestina Patches, MD 04/02/14 [phone]

## 2014-03-30 NOTE — ED Notes (Signed)
Pt back from CT.  Assisted to standing position to urinate.  Denied dizziness or weakness with change of position.

## 2014-03-30 NOTE — ED Notes (Addendum)
Per EMS- pt had a syncopal episode today. Pt has had hx of same in the past. Pt has parkinsons disease and when he had these episode in the past they changed his medications and they stopped for approx 1 year. Pt states that he has had several episodes recently. Pt reported to have bp of 90/60 at the drs office this morning. Pt aslo became diaphoretic and nauseated prior to event

## 2014-03-30 NOTE — ED Notes (Signed)
X-ray at bedside

## 2014-03-30 NOTE — H&P (Signed)
PATIENT DETAILS Name: [name] Age: 69 y.o. Sex: male Date of Birth: 1945/11/14 Admit Date: 03/30/2014 [id number], Veneda Melter, MD   CHIEF COMPLAINT:  Syncope  HPI: [name] is a 69 y.o. male with a Past Medical History of Parkinson's disease and hypothyroidism who presents today with the above noted complaint. Per patient, approximately around 6-7 PM yesterday evening while sitting patient started having sweatiness and dizziness, neck and he knows is that he found himself on the floor. No history of urinary or fecal incontinence. No history of tongue bite. Patient thinks he must have passed out. Later that evening around 10 AM while walking to schedule, he had a similar feeling of sweatiness dizziness, he thought he was about to pass out so he quickly lowered himself to the floor-he never lost consciousness this time around. He subsequently went to his primary care practitioner and was sent to the ED for further evaluation and treatment. No history of chest pain or shortness of breath No history of nausea, vomiting or diarrhea.   ALLERGIES:   Allergies  Allergen Reactions  . Ibuprofen Other (See Comments)    Blood in stool.  . Lamictal [Lamotrigine] Other (See Comments)    Blisters in mouth  . Nsaids   . Penicillins     PAST MEDICAL HISTORY: Past Medical History  Diagnosis Date  . Thyroid disease   . Allergy   . Depression   . Parkinson's disease   . Anemia   . Parkinson's disease 06/05/2012    PAST SURGICAL HISTORY: Past Surgical History  Procedure Laterality Date  . Mandible fracture surgery    . Hernia repair    . Wrist fracture surgery    . Rotator cuff repair    . Tonsillectomy    . Fracture surgery      jaw  . Vasectomy    . Appendectomy      MEDICATIONS AT HOME: Prior to Admission medications   Medication Sig Start Date End Date Taking? Authorizing Provider  aspirin 81 MG tablet Take 160 mg by mouth daily.   Yes Historical  Provider, MD  AZILECT 1 MG TABS tablet TAKE 1 TABLET BY MOUTH EVERY DAY 03/23/14  Yes Star Age, MD  Biotin (BIOTIN 5000) 5 MG CAPS Take 1 capsule by mouth daily.   Yes Historical Provider, MD  carbidopa-levodopa (SINEMET IR) 25-100 MG per tablet TAKE 1 TABLET BY MOUTH THREE TIMES DAILY 12/27/12  Yes Star Age, MD  levothyroxine (SYNTHROID, LEVOTHROID) 50 MCG tablet Take 1 tablet (50 mcg total) by mouth every other day. 03/12/14  Yes Wendie Agreste, MD  levothyroxine (SYNTHROID, LEVOTHROID) 75 MCG tablet Take 1 tablet (75 mcg total) by mouth every other day. 03/12/14  Yes Wendie Agreste, MD  Multiple Vitamins-Minerals (MULTIVITAMIN WITH MINERALS) tablet Take 1 tablet by mouth daily.   Yes Historical Provider, MD  rOPINIRole (REQUIP) 2 MG tablet TAKE 1/2 TABLET BY MOUTH AT 6:30AM, 1/2 TABLET BY MOUTH AT NOON, 1/2 TABLET BY MOUTH AT 5PM, AND 1 1/2 TABLETS AT 9PM 05/12/13  Yes Star Age, MD    FAMILY HISTORY: Family History  Problem Relation Age of Onset  . Cancer Father     colon    SOCIAL HISTORY:  reports that he has never smoked. He has never used smokeless tobacco. He reports that he drinks alcohol. He reports that he does not use illicit drugs.  REVIEW OF SYSTEMS:  Constitutional:   No  weight loss, night sweats,  Fevers, chills, fatigue.  HEENT:    No headaches, Difficulty swallowing,Tooth/dental problems,Sore throat   Cardio-vascular: No chest pain,  Orthopnea, PND, swelling in lower extremities GI:  No heartburn, indigestion, abdominal pain, nausea, vomiting, diarrhea  Resp: No shortness of breath with exertion or at rest.  No excess mucus, no productive cough, No non-productive cough  Skin:  no rash or lesions.  GU:  no dysuria, change in color of urine, no urgency or frequency.  No flank pain.  Musculoskeletal: No joint pain or swelling.  No decreased range of motion.  No back pain.  Psych: No change in mood or affect. No depression or anxiety.  No memory  loss.   PHYSICAL EXAM: Blood pressure 160/92, pulse 66, temperature 97.2 F (36.2 C), resp. rate 22, SpO2 100 %.  General appearance :Awake, alert, not in any distress. Speech Clear. Not toxic Looking HEENT: Atraumatic and Normocephalic, pupils equally reactive to light and accomodation Neck: supple, no JVD. No cervical lymphadenopathy.  Chest:Good air entry bilaterally, no added sounds  CVS: S1 S2 regular, no murmurs.  Abdomen: Bowel sounds present, Non tender and not distended with no gaurding, rigidity or rebound. Extremities: B/L Lower Ext shows no edema, both legs are warm to touch Neurology: Awake alert, and oriented X 3, CN II-XII intact, Non focal Skin:No Rash Wounds:N/A  LABS ON ADMISSION:   Recent Labs  03/30/14 1103  NA 144  K 4.3  CL 111  CO2 27  GLUCOSE 76  BUN 17  CREATININE 0.95  CALCIUM 9.4   No results for input(s): AST, ALT, ALKPHOS, BILITOT, PROT, ALBUMIN in the last 72 hours. No results for input(s): LIPASE, AMYLASE in the last 72 hours.  Recent Labs  03/30/14 1103  WBC 4.5  HGB 12.8*  HCT 38.7*  MCV 91.7  PLT 214   No results for input(s): CKTOTAL, CKMB, CKMBINDEX, TROPONINI in the last 72 hours. No results for input(s): DDIMER in the last 72 hours. Invalid input(s): POCBNP   RADIOLOGIC STUDIES ON ADMISSION: Ct Head Wo Contrast  03/30/2014   CLINICAL DATA:  Loss of consciousness with fall.  Initial encounter.  EXAM: CT HEAD WITHOUT CONTRAST  TECHNIQUE: Contiguous axial images were obtained from the base of the skull through the vertex without intravenous contrast.  COMPARISON:  10/31/2012  FINDINGS: Skull and Sinuses:Remote subcondylar fracture of the left mandible with chronic TMJ dislocation on the left. No acute fracture. No sinus or mastoid opacification.  Orbits: No acute abnormality.  Brain: No evidence of acute infarction, hemorrhage, hydrocephalus, or mass lesion/mass effect.  IMPRESSION: 1. Negative intracranial imaging. 2. Remote  subcondylar left mandible fracture with chronic TMJ dislocation.   Electronically Signed   By: Jorje Guild M.D.   On: 03/30/2014 12:58   Dg Chest Port 1 View  03/30/2014   CLINICAL DATA:  Loss of consciousness.  EXAM: PORTABLE CHEST - 1 VIEW  COMPARISON:  November 14, 2013.  FINDINGS: The heart size and mediastinal contours are within normal limits. Both lungs are clear. No pneumothorax or pleural effusion is noted. The visualized skeletal structures are unremarkable.  IMPRESSION: No acute cardiopulmonary abnormality seen.   Electronically Signed   By: Sabino Dick M.D.   On: 03/30/2014 11:56     EKG: Independently reviewed. normal sinus rhythm   ASSESSMENT AND PLAN: Present on Admission:  . Syncope: Suspect secondary to autonomic dysfunction from Parkinson's disease. CT head, EKG and cardiac enzymes negative so far. Monitor in telemetry, check  echocardiogram and orthostatic vital signs. If no significant abnormalities, suspect can be discharged tomorrow morning.   . Parkinson's disease: Continue with Sinemet and other usual home medications.   . Hypothyroid: Continue with levothyroxine.  Further plan will depend as patient's clinical course evolves and further radiologic and laboratory data become available. Patient will be monitored closely.  Above noted plan was discussed with patient,he was in agreement.   DVT Prophylaxis: Prophylactic Lovenox   Code Status: Full Code   Disposition Plan: Home in 1 day   Total time spent for admission equals 45 minutes.  [facility] Pager [phone]  If 7PM-7AM, please contact night-coverage [email or web address] Password [email or web address] 03/30/2014, 5:05 PM

## 2014-03-30 NOTE — Progress Notes (Signed)
Saintclair Halsted in ED for report

## 2014-03-30 NOTE — Progress Notes (Addendum)
Subjective:  This chart was scribed for Darlyne Russian, MD by Ladene Artist, ED Scribe. The patient was seen in room 5. Patient's care was started at 9:30 AM.   Patient ID: [name], male    DOB: 1946-02-08, 69 y.o.   MRN: [id number]  Chief Complaint  Patient presents with  . Dizzy spell and passed out    Twice last night  . Nausea  . Cold sweat   HPI HPI Comments: [name] is a 69 y.o. male, with a h/o thyroid disease, anemia, Parkinson's disease, who presents to the [facility] complaining of dizziness onset last night after eating dinner. Pt states that he was sitting in a chair doing a crossword puzzle when he fell out of the chair and onto the floor. He reports a witnessed episode of syncope that occurred around 6 PM last night and lasted for approximately 3 minutes. He states that he went to bed and woke up around 10:30 PM for something to drink. Pt states that he was walking to the kitchen when he experienced another episode of syncope. Pt reports associated cold sweats and nausea with both episodes. He denies chest pain, SOB, dizziness with standing. Pt reports a h/o similar symptoms that did not require further workup. Pt states that he has been told in the past that his BP was too low and he was not drinking enough fluids. He states that he has been drinking plenty fluids since. Pt denies recent change in medications. He was last seen by his neurologist Dr. Rexene Alberts with [facility].    Past Medical History  Diagnosis Date  . Thyroid disease   . Allergy   . Depression   . Parkinson's disease   . Anemia   . Parkinson's disease 06/05/2012   Current Outpatient Prescriptions on File Prior to Visit  Medication Sig Dispense Refill  . aspirin 81 MG tablet Take 160 mg by mouth daily.    . AZILECT 1 MG TABS tablet TAKE 1 TABLET BY MOUTH EVERY DAY 30 tablet 1  . Biotin (BIOTIN 5000) 5 MG CAPS Take 1 capsule by mouth daily.    . carbidopa-levodopa  (SINEMET IR) 25-100 MG per tablet TAKE 1 TABLET BY MOUTH THREE TIMES DAILY 270 tablet 3  . levothyroxine (SYNTHROID, LEVOTHROID) 50 MCG tablet Take 1 tablet (50 mcg total) by mouth every other day. 90 tablet 0  . levothyroxine (SYNTHROID, LEVOTHROID) 75 MCG tablet Take 1 tablet (75 mcg total) by mouth every other day. 90 tablet 0  . Multiple Vitamins-Minerals (MULTIVITAMIN WITH MINERALS) tablet Take 1 tablet by mouth daily.    Marland Kitchen rOPINIRole (REQUIP) 2 MG tablet TAKE 1/2 TABLET BY MOUTH AT 6:30AM, 1/2 TABLET BY MOUTH AT NOON, 1/2 TABLET BY MOUTH AT 5PM, AND 1 1/2 TABLETS AT 9PM 270 tablet 3   No current facility-administered medications on file prior to visit.   Allergies  Allergen Reactions  . Ibuprofen Other (See Comments)    Blood in stool.  . Lamictal [Lamotrigine] Other (See Comments)    Blisters in mouth  . Nsaids   . Penicillins    Review of Systems  Respiratory: Negative for shortness of breath.   Cardiovascular: Negative for chest pain.  Gastrointestinal: Positive for nausea.  Neurological: Positive for dizziness and syncope.      Objective:   Physical Exam CONSTITUTIONAL: Well developed/well nourished.  HEAD: Normocephalic/atraumatic EYES: EOMI/PERRL ENMT: Mucous membranes moist NECK: supple no meningeal signs SPINE/BACK:entire spine nontender  CV: S1/S2 noted, no murmurs/rubs/gallops noted LUNGS: Lungs are clear to auscultation bilaterally, no apparent distress ABDOMEN: soft, nontender, no rebound or guarding, bowel sounds noted throughout abdomen. GU: no cva tenderness NEURO: Pt is alert and cooperative. Moves all extremitiesx4. No facial droop. Typical phasing of PD.  EXTREMITIES: pulses normal/equal, full ROM. Mild cogwheeling rigidity. SKIN: Pt is somewhat clammy and pale. PSYCH: no abnormalities of mood noted, alert and oriented to situation   EKG normal sinus rhythm sinus bradycardia. Assessment & Plan:  I suspect the syncopal episodes are related to autonomic  dysfunction related to his Parkinson disease. He had a previous cardiac workup in 2014. Since he has done well since that time and now has recurrent symptoms I do feel further evaluation is indicated. Will transport to the hospital for further evaluation. The fact that the patient is having true syncope and not just dizzy spells is of concern.I personally performed the services described in this documentation, which was scribed in my presence. The recorded information has been reviewed and is accurate.

## 2014-03-30 NOTE — ED Notes (Signed)
Admitting at bedside 

## 2014-03-30 NOTE — ED Notes (Signed)
Attempted report 5W, RN Ginger.

## 2014-03-30 NOTE — Progress Notes (Signed)
[name] [id number] Admission Data: 03/30/2014 7:18 PM Attending Provider: Jonetta Osgood, MD  [id number], Veneda Melter, MD Consults/ Treatment Team:    [name] is a 69 y.o. male patient admitted from ED awake, alert  & orientated  X 3,  Full Code, VSS - Blood pressure 156/98, pulse 59, temperature 97.7 F (36.5 C), temperature source Oral, resp. rate 16, height 5\' 8"  (1.727 m), weight 55.2 kg (121 lb 11.1 oz), SpO2 100 %. no c/o shortness of breath, no c/o chest pain, no distress noted. Tele # 01 placed and pt is currently running:normal sinus rhythm.   IV site WDL:  forearm right, condition patent and no redness with a transparent dsg that's clean dry and intact.  Allergies:   Allergies  Allergen Reactions  . Ibuprofen Other (See Comments)    Blood in stool.  . Lamictal [Lamotrigine] Other (See Comments)    Blisters in mouth  . Nsaids   . Penicillins      Past Medical History  Diagnosis Date  . Thyroid disease   . Allergy   . Depression   . Parkinson's disease   . Anemia   . Parkinson's disease 06/05/2012    History:  obtained from the patient. Tobacco/alcohol: denied social drinker  Pt orientation to unit, room and routine. Information packet given to patient/family and safety video watched.  Admission INP armband ID verified with patient/family, and in place. SR up x 2, fall risk assessment complete with Patient and family verbalizing understanding of risks associated with falls. Pt verbalizes an understanding of how to use the call bell and to call for help before getting out of bed.  Skin, clean-dry- intact without evidence of bruising, or skin tears.   No evidence of skin break down noted on exam. no rashes, no ecchymoses, no petechiae, no nodules, no jaundice, no purpura, no wounds, no acanthosis nigricans, no striae    Will cont to monitor and assist as needed.  Corrisa Gibby Margaretha Sheffield, RN 03/30/2014 7:18 PM

## 2014-03-31 DIAGNOSIS — G4700 Insomnia, unspecified: Secondary | ICD-10-CM

## 2014-03-31 DIAGNOSIS — R55 Syncope and collapse: Secondary | ICD-10-CM

## 2014-03-31 DIAGNOSIS — G20 Parkinson's disease: Secondary | ICD-10-CM

## 2014-03-31 DIAGNOSIS — E039 Hypothyroidism, unspecified: Secondary | ICD-10-CM | POA: Diagnosis not present

## 2014-03-31 LAB — CBC
HEMATOCRIT: 42.8 % (ref 39.0–52.0)
Hemoglobin: 14.1 g/dL (ref 13.0–17.0)
MCH: 30.2 pg (ref 26.0–34.0)
MCHC: 32.9 g/dL (ref 30.0–36.0)
MCV: 91.6 fL (ref 78.0–100.0)
Platelets: 200 10*3/uL (ref 150–400)
RBC: 4.67 MIL/uL (ref 4.22–5.81)
RDW: 14.1 % (ref 11.5–15.5)
WBC: 3.3 10*3/uL — AB (ref 4.0–10.5)

## 2014-03-31 LAB — TROPONIN I: Troponin I: <0.03 ng/mL (ref <0.031)

## 2014-03-31 LAB — BASIC METABOLIC PANEL
ANION GAP: 5 (ref 5–15)
BUN: 17 mg/dL (ref 6–23)
CALCIUM: 9.3 mg/dL (ref 8.4–10.5)
CO2: 28 mmol/L (ref 19–32)
CREATININE: 1.03 mg/dL (ref 0.50–1.35)
Chloride: 106 mmol/L (ref 96–112)
GFR calc Af Amer: 85 mL/min — ABNORMAL LOW (ref >90)
GFR calc non Af Amer: 74 mL/min — ABNORMAL LOW (ref >90)
Glucose, Bld: 88 mg/dL (ref 70–99)
Potassium: 4.6 mmol/L (ref 3.5–5.1)
Sodium: 139 mmol/L (ref 135–145)

## 2014-03-31 MED ORDER — MELATONIN 3 MG PO TABS
3.0000 mg | ORAL_TABLET | Freq: Every evening | ORAL | Status: DC | PRN
  Filled 2014-03-31: qty 1

## 2014-03-31 NOTE — Progress Notes (Signed)
[facility] PROGRESS NOTE   [name] NOT:[id number] DOB: 1945-07-30 DOA: 03/30/2014 PCP: Kennon Portela, MD  HPI/Subjective: Patient feels okay, denies any syncope like episodes in the hospital  Assessment/Plan: Principal Problem:   Syncope Active Problems:   Hypothyroid   Parkinson's disease   Insomnia   Syncope Suspect secondary to autonomic dysfunction from Parkinson's disease.  CT head, EKG and cardiac enzymes negative so far.  Monitor in telemetry. 2-D echo pending, added carotid duplex. Patient might be discharged in a.m. I discussed this with the patient this could be secondary to worsening of Parkinson's disease.  Parkinson's disease Continue with Sinemet and other usual home medications.   Hypothyroid Continue with levothyroxine.  Code Status: Full Code Family Communication: Plan discussed with the patient. Disposition Plan: Remains inpatient Diet: Diet Heart  Consultants:  None  Procedures:  None  Antibiotics:  None   Objective: Filed Vitals:   03/31/14 1325  BP: 117/73  Pulse: 56  Temp: 97.7 F (36.5 C)  Resp: 18    Intake/Output Summary (Last 24 hours) at 03/31/14 1637 Last data filed at 03/31/14 1550  Gross per 24 hour  Intake   1200 ml  Output    750 ml  Net    450 ml   Filed Weights   03/30/14 1729  Weight: 55.2 kg (121 lb 11.1 oz)    Exam: General: Alert and awake, oriented x3, not in any acute distress. HEENT: anicteric sclera, pupils reactive to light and accommodation, EOMI CVS: S1-S2 clear, no murmur rubs or gallops Chest: clear to auscultation bilaterally, no wheezing, rales or rhonchi Abdomen: soft nontender, nondistended, normal bowel sounds, no organomegaly Extremities: no cyanosis, clubbing or edema noted bilaterally Neuro: Cranial nerves II-XII intact, no focal neurological deficits  Data Reviewed: Basic Metabolic Panel:  Recent Labs Lab 03/30/14 1103 03/31/14 0621  NA 144 139  K 4.3  4.6  CL 111 106  CO2 27 28  GLUCOSE 76 88  BUN 17 17  CREATININE 0.95 1.03  CALCIUM 9.4 9.3   Liver Function Tests: No results for input(s): AST, ALT, ALKPHOS, BILITOT, PROT, ALBUMIN in the last 168 hours. No results for input(s): LIPASE, AMYLASE in the last 168 hours. No results for input(s): AMMONIA in the last 168 hours. CBC:  Recent Labs Lab 03/30/14 1103 03/31/14 0621  WBC 4.5 3.3*  HGB 12.8* 14.1  HCT 38.7* 42.8  MCV 91.7 91.6  PLT 214 200   Cardiac Enzymes:  Recent Labs Lab 03/30/14 1845 03/30/14 2315 03/31/14 0621  TROPONINI <0.03 <0.03 <0.03   BNP (last 3 results) No results for input(s): BNP in the last 8760 hours.  ProBNP (last 3 results) No results for input(s): PROBNP in the last 8760 hours.  CBG: No results for input(s): GLUCAP in the last 168 hours.  Micro No results found for this or any previous visit (from the past 240 hour(s)).   Studies: Ct Head Wo Contrast  03/30/2014   CLINICAL DATA:  Loss of consciousness with fall.  Initial encounter.  EXAM: CT HEAD WITHOUT CONTRAST  TECHNIQUE: Contiguous axial images were obtained from the base of the skull through the vertex without intravenous contrast.  COMPARISON:  10/31/2012  FINDINGS: Skull and Sinuses:Remote subcondylar fracture of the left mandible with chronic TMJ dislocation on the left. No acute fracture. No sinus or mastoid opacification.  Orbits: No acute abnormality.  Brain: No evidence of acute infarction, hemorrhage, hydrocephalus, or mass lesion/mass effect.  IMPRESSION: 1. Negative intracranial imaging. 2. Remote  subcondylar left mandible fracture with chronic TMJ dislocation.   Electronically Signed   By: Jorje Guild M.D.   On: 03/30/2014 12:58   Dg Chest Port 1 View  03/30/2014   CLINICAL DATA:  Loss of consciousness.  EXAM: PORTABLE CHEST - 1 VIEW  COMPARISON:  November 14, 2013.  FINDINGS: The heart size and mediastinal contours are within normal limits. Both lungs are clear. No  pneumothorax or pleural effusion is noted. The visualized skeletal structures are unremarkable.  IMPRESSION: No acute cardiopulmonary abnormality seen.   Electronically Signed   By: Sabino Dick M.D.   On: 03/30/2014 11:56    Scheduled Meds: . aspirin EC  162 mg Oral Daily  . carbidopa-levodopa  1 tablet Oral TID  . enoxaparin (LOVENOX) injection  40 mg Subcutaneous Q24H  . levothyroxine  50 mcg Oral QODAY  . [START ON 04/01/2014] levothyroxine  75 mcg Oral QODAY  . rasagiline  1 mg Oral Daily  . rOPINIRole  2 mg Oral QID  . sodium chloride  3 mL Intravenous Q12H  . sodium chloride  3 mL Intravenous Q12H   Continuous Infusions:      Time spent: 35 minutes    [facility] A  [facility] Pager [phone] If 7PM-7AM, please contact night-coverage at [email or web address], password [facility] 03/31/2014, 4:37 PM  LOS: 1 day

## 2014-03-31 NOTE — Progress Notes (Signed)
Echocardiogram 2D Echocardiogram has been performed.  Joelene Millin 03/31/2014, 3:26 PM

## 2014-03-31 NOTE — Progress Notes (Signed)
UR completed 

## 2014-04-01 DIAGNOSIS — E039 Hypothyroidism, unspecified: Secondary | ICD-10-CM | POA: Diagnosis not present

## 2014-04-01 DIAGNOSIS — R55 Syncope and collapse: Secondary | ICD-10-CM

## 2014-04-01 DIAGNOSIS — G20 Parkinson's disease: Secondary | ICD-10-CM | POA: Diagnosis not present

## 2014-04-01 NOTE — Discharge Summary (Signed)
[name] to be D/C'd Home per MD order.  Discussed with the patient and all questions fully answered.    Medication List    TAKE these medications        aspirin 81 MG tablet  Take 160 mg by mouth daily.     AZILECT 1 MG Tabs tablet  Generic drug:  rasagiline  TAKE 1 TABLET BY MOUTH EVERY DAY     BIOTIN 5000 5 MG Caps  Generic drug:  Biotin  Take 1 capsule by mouth daily.     carbidopa-levodopa 25-100 MG per tablet  Commonly known as:  SINEMET IR  TAKE 1 TABLET BY MOUTH THREE TIMES DAILY     levothyroxine 75 MCG tablet  Commonly known as:  SYNTHROID, LEVOTHROID  Take 1 tablet (75 mcg total) by mouth every other day.     levothyroxine 50 MCG tablet  Commonly known as:  SYNTHROID, LEVOTHROID  Take 1 tablet (50 mcg total) by mouth every other day.     multivitamin with minerals tablet  Take 1 tablet by mouth daily.     rOPINIRole 2 MG tablet  Commonly known as:  REQUIP  TAKE 1/2 TABLET BY MOUTH AT 6:30AM, 1/2 TABLET BY MOUTH AT NOON, 1/2 TABLET BY MOUTH AT 5PM, AND 1 1/2 TABLETS AT 9PM        VVS, Skin clean, dry and intact without evidence of skin break down, no evidence of skin tears noted. IV catheter discontinued intact. Site without signs and symptoms of complications. Dressing and pressure applied.  An After Visit Summary was printed and given to the patient.  D/c education completed with patient/family including follow up instructions, medication list, d/c activities limitations if indicated, with other d/c instructions as indicated by MD - patient able to verbalize understanding, all questions fully answered.   Patient instructed to return to ED, call 911, or call MD for any changes in condition.   Patient escorted via [place], and D/C home via private auto.  [name] F 04/01/2014 3:25 PM

## 2014-04-01 NOTE — Progress Notes (Signed)
*  PRELIMINARY RESULTS* Vascular Ultrasound Carotid Duplex (Doppler) has been completed.   Findings suggest 1-39% carotid artery stenosis bilaterally. Vertebral arteries are patent with antegrade flow.  04/01/2014 2:02 PM Maudry Mayhew, RVT, RDCS, RDMS

## 2014-04-01 NOTE — Discharge Summary (Signed)
Discharge Summary  [name] MR#: [id number]  DOB:09/13/1945  Date of Admission: 03/30/2014 Date of Discharge: 04/01/2014  Patient's PCP: Kennon Portela, MD  Attending Physician:Dorita Rowlands A  Consults: None  Discharge Diagnoses: Principal Problem:   Syncope Active Problems:   Hypothyroid   Parkinson's disease   Insomnia   Brief Admitting History and Physical On admission: "[name] is a 69 y.o. male with a Past Medical History of Parkinson's disease and hypothyroidism who presents today with the above noted complaint. Per patient, approximately around 6-7 PM yesterday evening while sitting patient started having sweatiness and dizziness, neck and he knows is that he found himself on the floor. No history of urinary or fecal incontinence. No history of tongue bite. Patient thinks he must have passed out. Later that evening around 10 AM while walking to schedule, he had a similar feeling of sweatiness dizziness, he thought he was about to pass out so he quickly lowered himself to the floor-he never lost consciousness this time around. He subsequently went to his primary care practitioner and was sent to the ED for further evaluation and treatment. No history of chest pain or shortness of breath No history of nausea, vomiting or diarrhea."  Discharge Medications   Medication List    TAKE these medications        aspirin 81 MG tablet  Take 160 mg by mouth daily.     AZILECT 1 MG Tabs tablet  Generic drug:  rasagiline  TAKE 1 TABLET BY MOUTH EVERY DAY     BIOTIN 5000 5 MG Caps  Generic drug:  Biotin  Take 1 capsule by mouth daily.     carbidopa-levodopa 25-100 MG per tablet  Commonly known as:  SINEMET IR  TAKE 1 TABLET BY MOUTH THREE TIMES DAILY     levothyroxine 75 MCG tablet  Commonly known as:  SYNTHROID, LEVOTHROID  Take 1 tablet (75 mcg total) by mouth every other day.     levothyroxine 50 MCG tablet  Commonly known as:  SYNTHROID, LEVOTHROID   Take 1 tablet (50 mcg total) by mouth every other day.     multivitamin with minerals tablet  Take 1 tablet by mouth daily.     rOPINIRole 2 MG tablet  Commonly known as:  REQUIP  TAKE 1/2 TABLET BY MOUTH AT 6:30AM, 1/2 TABLET BY MOUTH AT NOON, 1/2 TABLET BY MOUTH AT 5PM, AND 1 1/2 TABLETS AT 9PM        Hospital Course: Syncope Present on Admission:  . Syncope . Parkinson's disease . Hypothyroid   Syncope -Suspect secondary to autonomic dysfunction from Parkinson's disease.  -CT head, EKG and cardiac enzymes negative so far.  -No events on telemetry.  -2-D echo and carotid Dopplers done with results as indicated below.  -Patient had no further symptoms during the hospital stay.   -Patient was instructed follow up with his primary care physician, cardiology, neurology after discharge in 1-2 weeks to determine if he wants further evaluation.  Parkinson's disease Continue with Sinemet and other usual home medications.   Hypothyroid Continue with levothyroxine.  Consultants:  None  Antibiotics:  None  Day of Discharge BP 127/74 mmHg  Pulse 70  Temp(Src) 97.9 F (36.6 C) (Oral)  Resp 16  Ht _0  (1.727 m)  Wt 55.2 kg (121 lb 11.1 oz)  BMI 18.51 kg/m2  SpO2 100%   Physical Exam: General: Awake, Oriented, No acute distress. HEENT: EOMI. Neck: Supple CV: S1 and S2 Lungs: Clear  to ascultation bilaterally Abdomen: Soft, Nontender, Nondistended, +bowel sounds. Ext: Good pulses. Trace edema.  Results for orders placed or performed during the hospital encounter of 03/30/14 (from the past 48 hour(s))  Troponin I     Status: None   Collection Time: 03/30/14  6:45 PM  Result Value Ref Range   Troponin I <0.03 <0.031 ng/mL    Comment:        NO INDICATION OF MYOCARDIAL INJURY.   Troponin I     Status: None   Collection Time: 03/30/14 11:15 PM  Result Value Ref Range   Troponin I <0.03 <0.031 ng/mL    Comment:        NO INDICATION OF MYOCARDIAL  INJURY.   Basic metabolic panel     Status: Abnormal   Collection Time: 03/31/14  6:21 AM  Result Value Ref Range   Sodium 139 135 - 145 mmol/L   Potassium 4.6 3.5 - 5.1 mmol/L   Chloride 106 96 - 112 mmol/L   CO2 28 19 - 32 mmol/L   Glucose, Bld 88 70 - 99 mg/dL   BUN 17 6 - 23 mg/dL   Creatinine, Ser 1.03 0.50 - 1.35 mg/dL   Calcium 9.3 8.4 - 10.5 mg/dL   GFR calc non Af Amer 74 (L) >90 mL/min   GFR calc Af Amer 85 (L) >90 mL/min    Comment: (NOTE) The eGFR has been calculated using the CKD EPI equation. This calculation has not been validated in all clinical situations. eGFR's persistently <90 mL/min signify possible Chronic Kidney Disease.    Anion gap 5 5 - 15  CBC     Status: Abnormal   Collection Time: 03/31/14  6:21 AM  Result Value Ref Range   WBC 3.3 (L) 4.0 - 10.5 K/uL   RBC 4.67 4.22 - 5.81 MIL/uL   Hemoglobin 14.1 13.0 - 17.0 g/dL   HCT 42.8 39.0 - 52.0 %   MCV 91.6 78.0 - 100.0 fL   MCH 30.2 26.0 - 34.0 pg   MCHC 32.9 30.0 - 36.0 g/dL   RDW 14.1 11.5 - 15.5 %   Platelets 200 150 - 400 K/uL  Troponin I     Status: None   Collection Time: 03/31/14  6:21 AM  Result Value Ref Range   Troponin I <0.03 <0.031 ng/mL    Comment:        NO INDICATION OF MYOCARDIAL INJURY.     Ct Head Wo Contrast  03/30/2014   CLINICAL DATA:  Loss of consciousness with fall.  Initial encounter.  EXAM: CT HEAD WITHOUT CONTRAST  TECHNIQUE: Contiguous axial images were obtained from the base of the skull through the vertex without intravenous contrast.  COMPARISON:  10/31/2012  FINDINGS: Skull and Sinuses:Remote subcondylar fracture of the left mandible with chronic TMJ dislocation on the left. No acute fracture. No sinus or mastoid opacification.  Orbits: No acute abnormality.  Brain: No evidence of acute infarction, hemorrhage, hydrocephalus, or mass lesion/mass effect.  IMPRESSION: 1. Negative intracranial imaging. 2. Remote subcondylar left mandible fracture with chronic TMJ  dislocation.   Electronically Signed   By: Jorje Guild M.D.   On: 03/30/2014 12:58   Dg Chest Port 1 View  03/30/2014   CLINICAL DATA:  Loss of consciousness.  EXAM: PORTABLE CHEST - 1 VIEW  COMPARISON:  November 14, 2013.  FINDINGS: The heart size and mediastinal contours are within normal limits. Both lungs are clear. No pneumothorax or pleural effusion is noted. The visualized  skeletal structures are unremarkable.  IMPRESSION: No acute cardiopulmonary abnormality seen.   Electronically Signed   By: Sabino Dick M.D.   On: 03/30/2014 11:56   2-D echocardiogram on 03/31/2014 Study Conclusions  - Left ventricle: The cavity size was normal. There was very mild concentric hypertrophy. Systolic function was vigorous. The estimated ejection fraction was in the range of 65% to 70%. Wall motion was normal; there were no regional wall motion abnormalities. Features are consistent with a pseudonormal left ventricular filling pattern, with concomitant abnormal relaxation and increased filling pressure (grade 2 diastolic dysfunction). - Aortic valve: Mildly calcified annulus. Trileaflet; mildly thickened leaflets. There was mild regurgitation. - Mitral valve: There was mild regurgitation. - Right ventricle: Systolic function was mildly reduced. - Tricuspid valve: There was mild regurgitation.   Carotid Dopplers on 04/01/2014 Summary: Findings suggest 1-39% internal carotid artery stenosis bilaterally. Vertebral arteries are patent with antegrade flow.   Disposition: Home  Diet: Heart healthy diet  Activity: Resume as tolerated   Follow-up Appts:     Discharge Instructions    Diet - low sodium heart healthy    Complete by:  As directed      Discharge instructions    Complete by:  As directed   Please follow-up with your primary care physician in one week. Please follow-up with your cardiologist, Dr. Debara Pickett, in 1-2 weeks. Please follow-up with your neurologist,  [facility], in 1-2 weeks.     Increase activity slowly    Complete by:  As directed            TESTS THAT NEED FOLLOW-UP None  Time spent on discharge, talking to the patient, and coordinating care: 25 mins.   Signed: Bynum Bellows, MD 04/01/2014, 2:27 PM

## 2014-04-02 ENCOUNTER — Telehealth: Payer: Self-pay | Admitting: Neurology

## 2014-04-02 NOTE — Telephone Encounter (Signed)
Pt just got out of the hospital for syncope has seen Dr Rexene Alberts in the past for same thing he needs to f/u with Dr Rexene Alberts please call [phone] dg

## 2014-04-02 NOTE — Telephone Encounter (Signed)
Called and confirmed hospital f/u appointment with patient (2 wks)

## 2014-04-08 ENCOUNTER — Ambulatory Visit: Payer: Self-pay | Admitting: Neurology

## 2014-04-09 ENCOUNTER — Ambulatory Visit (INDEPENDENT_AMBULATORY_CARE_PROVIDER_SITE_OTHER): Payer: Medicare Other | Admitting: Internal Medicine

## 2014-04-09 ENCOUNTER — Encounter: Payer: Self-pay | Admitting: Internal Medicine

## 2014-04-09 VITALS — BP 136/82 | HR 64 | Ht 68.0 in | Wt 127.6 lb

## 2014-04-09 DIAGNOSIS — G20 Parkinson's disease: Secondary | ICD-10-CM | POA: Diagnosis not present

## 2014-04-09 DIAGNOSIS — R55 Syncope and collapse: Secondary | ICD-10-CM | POA: Diagnosis not present

## 2014-04-09 DIAGNOSIS — I951 Orthostatic hypotension: Secondary | ICD-10-CM

## 2014-04-09 MED ORDER — MIDODRINE HCL 10 MG PO TABS: 10.0000 mg | ORAL_TABLET | Freq: Every day | ORAL | Status: DC

## 2014-04-09 NOTE — Progress Notes (Signed)
OFFICE NOTE  Chief Complaint:  Syncope  Primary Care Physician: Kennon Portela, MD  HPI:  [name] this pleasant 69 year old male with unfortunate history of Parkinson's disease. He has no history of cardiac problems and no family history of significant heart disease. He is a lifetime nonsmoker and used no alcohol. He has no history of diabetes but does have a history of hypothyroidism. Recently he's noted that his blood pressure is running lower than normal. He had an episode a few weeks ago where he was bending over in his yard for long periods of time and picking up sticks and leaves. He noted that he been lightheaded and/or dizzy and felt an episode like he was going to fall out. Subsequently he did fall over and denies a lack of recognition of that event. He says that he was somewhat confused but realized he was on the ground and was too weak to get up right away. He eventually was able to get up on its on power after about 3 or 4 minutes. He's not had any episodes since then. The weather was quite hot that day and he had been working outside for periods of time. He is not currently on any blood pressure medications. He denied any chest pain or palpitations around the time of the event. He's had no further episodes since that event.  I had the pleasure of seeing [name] back in the office today. Previously I diagnosed him with autonomic dysfunction secondary to Parkinson's disease as the etiology of his postural hypotension and syncope. He apparently had another episode of near-syncope with symptoms of dysautonomia including sweating and became weak and fell to the floor. Cardiac workup was unremarkable except for some diastolic dysfunction. He denied any chest pain. Carotid Dopplers were negative. He currently is on treatment for his Parkinson's including Sinemet and has history of restless leg syndrome on ropinirole. Orthostatic blood pressures were checked in the office today  and were negative. His blood pressure actually was 132/87. In the hospital blood pressures were as high as 638 systolic.  PMHx:  Past Medical History  Diagnosis Date  . Thyroid disease   . Allergy   . Depression   . Parkinson's disease   . Anemia   . Parkinson's disease 06/05/2012    Past Surgical History  Procedure Laterality Date  . Mandible fracture surgery    . Hernia repair    . Wrist fracture surgery    . Rotator cuff repair    . Tonsillectomy    . Fracture surgery      jaw  . Vasectomy    . Appendectomy      FAMHx:  Family History  Problem Relation Age of Onset  . Cancer Father     colon    SOCHx:   reports that he has never smoked. He has never used smokeless tobacco. He reports that he drinks alcohol. He reports that he does not use illicit drugs.  ALLERGIES:  Allergies  Allergen Reactions  . Ibuprofen Other (See Comments)    Blood in stool.  . Lamictal [Lamotrigine] Other (See Comments)    Blisters in mouth  . Nsaids   . Penicillins     ROS: A comprehensive review of systems was negative except for: Neurological: positive for dizziness and syncope  HOME MEDS: Current Outpatient Prescriptions  Medication Sig Dispense Refill  . aspirin 81 MG tablet Take 160 mg by mouth daily.    . AZILECT 1 MG  TABS tablet TAKE 1 TABLET BY MOUTH EVERY DAY 30 tablet 1  . Biotin (BIOTIN 5000) 5 MG CAPS Take 1 capsule by mouth daily.    . carbidopa-levodopa (SINEMET IR) 25-100 MG per tablet TAKE 1 TABLET BY MOUTH THREE TIMES DAILY 270 tablet 3  . levothyroxine (SYNTHROID, LEVOTHROID) 50 MCG tablet Take 1 tablet (50 mcg total) by mouth every other day. 90 tablet 0  . levothyroxine (SYNTHROID, LEVOTHROID) 75 MCG tablet Take 1 tablet (75 mcg total) by mouth every other day. 90 tablet 0  . Multiple Vitamin (MULTIVITAMIN WITH MINERALS) TABS tablet Take 1 tablet by mouth daily.    . Multiple Vitamins-Minerals (MULTIVITAMIN WITH MINERALS) tablet Take 1 tablet by mouth daily.      Marland Kitchen rOPINIRole (REQUIP) 2 MG tablet TAKE 1/2 TABLET BY MOUTH AT 6:30AM, 1/2 TABLET BY MOUTH AT NOON, 1/2 TABLET BY MOUTH AT 5PM, AND 1 1/2 TABLETS AT 9PM 270 tablet 3   No current facility-administered medications for this visit.    LABS/IMAGING: No results found for this or any previous visit (from the past 48 hour(s)). No results found.  VITALS: BP 136/82 mmHg  Pulse 64  Ht 5\' 8"  (1.727 m)  Wt 127 lb 9.6 oz (57.879 kg)  BMI 19.41 kg/m2  EXAM: General appearance: alert and no distress Neck: no adenopathy, no carotid bruit, no JVD, supple, symmetrical, trachea midline and thyroid not enlarged, symmetric, no tenderness/mass/nodules Lungs: clear to auscultation bilaterally Heart: regular rate and rhythm, S1, S2 normal, no murmur, click, rub or gallop Abdomen: soft, non-tender; bowel sounds normal; no masses,  no organomegaly and scaphoid Extremities: extremities normal, atraumatic, no cyanosis or edema Pulses: 2+ and symmetric Skin: Skin color, texture, turgor normal. No rashes or lesions Neurologic: Mental status: Alert, oriented, thought content appropriate, minimal tremor, notable bulbar symptoms with speech Psych: Mood, affect normal - somewhat masked facies  EKG: Deferred  ASSESSMENT: 1. Autonomic postural hypotension 2. Syncope 3. Parkinson's disease with likely dysautonomia  PLAN: 1.   I still believe he is having autonomic postural hypotension. There is no evidence of orthostasis today however when he has these episodes I suspect he becomes orthostatic and/or bradycardic with significant vagal response diaphoresis. This may be difficult to manage. Typical treatment for patients who are commonly orthostatic or hypotensive would be the inclusion of Midodrine, however since his blood pressures are fairly normal, I think that there will be little benefit of this medicine. Another option is a newer medicine called Droxidopa, which is designed for this type of disorder. It would  have to be used cautiously with Sinemet and Requip and I would like to speak with his neurologist before we consider trying this medication. He never got the lower extremity compression stockings that I recommended his last office visit. I've encouraged him to purchase those in wear them routinely. Plan to see him back in a month to see if he's had any improvement in his symptoms and hopefully have had a chance to speak with his neurologist at that point.  Pixie Casino, MD, [facility] Attending Cardiologist [facility] [place] [facility] 04/09/2014, 12:37 PM

## 2014-04-09 NOTE — Patient Instructions (Addendum)
Your physician recommends that you schedule a follow-up appointment in:1 month with Dr.Hilty  Dr. Debara Pickett has ordered compression stockings 20-70mmHg

## 2014-04-15 ENCOUNTER — Ambulatory Visit (INDEPENDENT_AMBULATORY_CARE_PROVIDER_SITE_OTHER): Payer: Medicare Other | Admitting: Neurology

## 2014-04-15 ENCOUNTER — Encounter: Payer: Self-pay | Admitting: Neurology

## 2014-04-15 VITALS — BP 117/75 | HR 68 | Temp 98.1°F | Ht 67.0 in | Wt 128.0 lb

## 2014-04-15 DIAGNOSIS — R55 Syncope and collapse: Secondary | ICD-10-CM | POA: Diagnosis not present

## 2014-04-15 DIAGNOSIS — G909 Disorder of the autonomic nervous system, unspecified: Secondary | ICD-10-CM

## 2014-04-15 DIAGNOSIS — G20 Parkinson's disease: Secondary | ICD-10-CM

## 2014-04-15 MED ORDER — CARBIDOPA-LEVODOPA 25-100 MG PO TABS: 1.0000 | ORAL_TABLET | Freq: Three times a day (TID) | ORAL | Status: DC

## 2014-04-15 MED ORDER — RASAGILINE MESYLATE 1 MG PO TABS: 1.0000 mg | ORAL_TABLET | Freq: Every day | ORAL | Status: DC

## 2014-04-15 MED ORDER — ROPINIROLE HCL 2 MG PO TABS: ORAL_TABLET | ORAL | Status: DC

## 2014-04-15 NOTE — Progress Notes (Signed)
Subjective:    Patient ID: [name] is a 69 y.o. male.  HPI     Interim history:  [name] is a very pleasant 70 year old right-handed gentleman with an underlying medical history of hypothyroidism, OSA (not using CPAP currently, but tried in the past), RLS, MCI, CTS, depression, anemia, and allergies, who presents for followup consultation of his right-sided predominant, akinetic-rigid type Parkinson's disease, complicated by sleep disorder, RLS, memory loss, prior hallucinations, prior fall with injury, and syncope. He is unaccompanied today. I last saw him on 05/12/13, at which time he reported lightheadedness and his BP was in fact low. He denies any recent syncopal spells and no falls. He stopped Toviaz some 4 months prior (due to cost). He denied recent illness and recent medication changes. He had some word finding difficulty. He was driving and had not had recent issues driving. He had no constipation or hallucinations and was sleeping well.  In the interim, he was seen by our nurse practitioner, Ms. Clabe Seal on 11/13/2013, at which time he was stable, his blood pressure was stable, and he was kept on the same medications including Azilect, Sinemet and Requip.  In the interim, he was hospitalized from 03/30/2014 through 04/01/2014 for her syncopal spell. He had a syncopal event at home on 03/30/2014 and found himself down after having a warning sign of dizziness, and sweating around 6:54 PM. He went to see his primary care physician and was advised to come in to the emergency room. He had another presyncopal spell the same day. I reviewed the hospital records including the discharge summary. He had a echocardiogram on 03/31/2014 which showed mild tricuspid regurgitation, EF of 65-70%, no regional wall motion abnormalities, mild aortic regurgitation, mild mitral regurgitation, and mild concentric left ventricular hypertrophy. Carotid Doppler studies from 04/01/2014 showed: 1-39% internal  carotid artery stenosis bilaterally. There was antegrade vertebral artery flow. He had a head CT without contrast on 03/30/2014: 1. Negative intracranial imaging. 2. Remote subcondylar left mandible fracture with chronic TMJ dislocation. In addition, I personally reviewed the images through the PACS system. He was discharged home. He was seen by his cardiologist on 04/09/2014, and did not have any orthostatic blood pressure drops. He had had systolic blood pressures in the 160s in the hospital. His blood pressure at the time of his office visit with Dr. Debara Pickett was 132/87. Dr. Debara Pickett felt that he would not be a good candidate for midodrine in the context of no significant orthostatic blood pressure drop and normal blood pressure values at baseline. He emailed me regarding a trial of Droxidopa. The patient was advised to start using compression stockings which were previously recommended to him but he did not follow through with that.  Today, he reports feeling fine. He has no lightheadedness. He has not been using his compression stockings on in fact has not gotten them yet. His granddaughter brought him some but they are too small and too tight. He would get new ones. He does not drink enough water, maybe 1 glass a day. He likes to drink soda and tea. He walks about 6 miles a day, typically with his wife. His weight fluctuates. He has no new complaints. He feels motor-wise he has been stable.  I saw him on 11/12/2012, at which time he presented after a recent syncopal spell. I felt at the time that the syncope was secondary to a combination of things including autonomic dysregulation in the context of Parkinson's disease, exposure to heat for prolonged  period of time with dehydration and quick changes in position as well as potential medication side effects. He had a head CT which was negative. He also had recent blood work. I did not change his medications at the time but talk to him about supportive measures  including changing positions slowly, avoid prolonged heat exposure, staying well hydrated and using a stool for working in the yard and taking frequent breaks when working outside.    He had an event of LOC on 10/30/12. He reported that he had not been feeling well for the past 2 weeks prior to his syncope. He had been feeling dizzy especially with change of position with intermittent nausea reported. He had been in his yard working for several hours and became dizzy and had a blackout spell. He fell into the bushes, and came to after some seconds. He was bending up and down at lot. After he regained consciousness, he was feeling weak and eventually was able to go to the porch, where he sat for about 45 minutes. His wife was at work. He had some slight confusion and lack of energy, but denied any focal weakness/CP/palpitations/SOB. He did not have any prior syncopal spells. He [facility] and saw Dr. Tamala Julian on 10/31/2012. His BUN was borderline at 24, he had a CTH on 10/31/12, which was negative and yesterday went for cardiological consultation with Dr. Debara Pickett, who felt, that he had a vasovagal event in the context of dysautonomia with PD.   I first met him on 06/05/12, at which time I felt his Parkinson's disease was stable and I did not make any medication changes.   He previously followed with Dr. Morene Antu and was last seen by him on 02/05/2012, at which time he was complaining of right hip pain. No medication changes were done at the time. He has been on Synthroid, Sinemet 25/100 mg strength one tablet 3 times a day, Requip 2 mg strength half a pill at 6:30, half a pill at noon, half a pill at 5 PM and one and a half a pill at 9 PM. He is on rasagiline 1 mg once daily, baby aspirin, multivitamin, Toviaz.   He received a steroid injection into the R hip in 8/14, which helped the pain. He worked for [employer]. He lives with his wife in a one storey at [facility]. They have 2  grown children, son and daughter, both local. Two MAs and 4 cousins had Huntington's and he has been tested and was negative for it. Dr. Erling Cruz arranged the genetic testing. He has occasional depressive Sx, but better when he can get out and walk, he walks about 6 miles/day.   He was diagnosed with Parkinson's disease in July 2009, with symptoms going back a year prior and consisted of trouble with speech and eating. He was initially started on Requip, Azilect and and Sinemet. He had side effects from the Requip including excessive spending, daytime sleepiness and hypersexuality. His Requip dose was therefore decreased. He exercises regularly. He is independent in his ADLs. He has had visual hallucinations. He fell in October 2011 fracturing his left distal radius, requiring surgery in December 2011. He had right-sided leg cramps improved with Flexeril, but stopped the medication. He denied leg edema, or compulsive behavior. He has bladder incontinence. In December 2013 his MMSE was 29, clock drawing was poor and animal fluency was 17, geriatric depression scale was 8/15.   His Past Medical History  Is Significant For: Past Medical History  Diagnosis Date  . Thyroid disease   . Allergy   . Depression   . Parkinson's disease   . Anemia   . Parkinson's disease 06/05/2012  . Raynaud's syndrome     His Past Surgical History Is Significant For: Past Surgical History  Procedure Laterality Date  . Mandible fracture surgery    . Hernia repair    . Wrist fracture surgery    . Rotator cuff repair    . Tonsillectomy    . Fracture surgery      jaw  . Vasectomy    . Appendectomy      His Family History Is Significant For: Family History  Problem Relation Age of Onset  . Cancer Father     colon    His Social History Is Significant For: History   Social History  . Marital Status: Married    Spouse Name: [name]  . Number of Children: 2  . Years of Education: 12+   Occupational History  .  DISABILITY     due to Parkinson's   Social History Main Topics  . Smoking status: Never Smoker   . Smokeless tobacco: Never Used  . Alcohol Use: 0.0 - 4.2 oz/week    0-7 Cans of beer per week     Comment: occas.  . Drug Use: No  . Sexual Activity: No   Other Topics Concern  . None   Social History Narrative   Patient is married with 2 children.   Patient is right handed.   Patient has high school education.   Patient drinks 1-2 cups daily.    His Allergies Are:  Allergies  Allergen Reactions  . Ibuprofen Other (See Comments)    Blood in stool.  . Lamictal [Lamotrigine] Other (See Comments)    Blisters in mouth  . Nsaids   . Penicillins   :   His Current Medications Are:  Outpatient Encounter Prescriptions as of 04/15/2014  Medication Sig  . aspirin 81 MG tablet Take 160 mg by mouth daily.  . AZILECT 1 MG TABS tablet TAKE 1 TABLET BY MOUTH EVERY DAY  . Biotin (BIOTIN 5000) 5 MG CAPS Take 1 capsule by mouth daily.  . carbidopa-levodopa (SINEMET IR) 25-100 MG per tablet TAKE 1 TABLET BY MOUTH THREE TIMES DAILY  . levothyroxine (SYNTHROID, LEVOTHROID) 50 MCG tablet Take 1 tablet (50 mcg total) by mouth every other day.  . levothyroxine (SYNTHROID, LEVOTHROID) 75 MCG tablet Take 1 tablet (75 mcg total) by mouth every other day.  . Multiple Vitamin (MULTIVITAMIN WITH MINERALS) TABS tablet Take 1 tablet by mouth daily.  . Multiple Vitamins-Minerals (MULTIVITAMIN WITH MINERALS) tablet Take 1 tablet by mouth daily.  Marland Kitchen rOPINIRole (REQUIP) 2 MG tablet TAKE 1/2 TABLET BY MOUTH AT 6:30AM, 1/2 TABLET BY MOUTH AT NOON, 1/2 TABLET BY MOUTH AT 5PM, AND 1 1/2 TABLETS AT 9PM  :  Review of Systems:  Out of a complete 14 point review of systems, all are reviewed and negative with the exception of these symptoms as listed below:   Review of Systems  Neurological: Positive for syncope.       Passed out 02/16,not sure of exact date    Objective:  Neurologic Exam  Physical  Exam Physical Examination:   Filed Vitals:   04/15/14 0835  BP: 117/75  Pulse: 68  Temp: 98.1 F (36.7 C)   He has no lightheadedness upon standing.  General Examination: The patient is a  very pleasant 70 y.o. male in no acute distress.  HEENT: Normocephalic, atraumatic, pupils are equal, round and reactive to light and accommodation. Funduscopy is normal. Extraocular tracking shows mild saccadic breakdown without nystagmus noted. There is no limitation to his gaze. There is mild decrease in eye blink rate. Hearing is intact. Face is symmetric with moderate facial masking and normal facial sensation. There is no lip, neck or jaw tremor. Neck is moderately rigid with intact passive ROM. There are no carotid bruits on auscultation. Oropharynx exam reveals mild mouth dryness. No significant airway crowding is noted. Mallampati is class II. Tongue protrudes centrally and palate elevates symmetrically. He has minimal drooling.   Chest: is clear to auscultation without wheezing, rhonchi or crackles noted.  Heart: sounds are regular and normal without murmurs, rubs or gallops noted.   Abdomen: is soft, non-tender and non-distended with normal bowel sounds appreciated on auscultation.  Extremities: There is no pitting edema in the distal lower extremities bilaterally. Pedal pulses are intact.  Skin: is warm and dry with no trophic changes noted.  Musculoskeletal: exam reveals no obvious joint deformities, tenderness or joint swelling or erythema.  Neurologically:  Mental status: The patient is awake and alert, paying good attention. He is able to completely provide the history. He is oriented to: person, place, time/date, situation, day of week, month of year and year. His memory, attention, language and knowledge are intact. There is no aphasia, agnosia, apraxia or anomia. There is a mild degree of bradyphrenia. Speech is mildly hypophonic with minimal dysarthria noted. Mood is congruent and  affect is normal.  Cranial nerves are as described above under HEENT exam. In addition, shoulder shrug is normal with equal shoulder height noted.  Motor exam: Normal bulk, and strength for age is noted. Tone is mildly rigid with presence of cogwheeling in the right upper extremity. There is overall moderate bradykinesia. There is no drift or rebound. There is no tremor.  Romberg is negative. Reflexes are 2+ in the upper extremities and 2+ in the lower extremities. Fine motor skills exam reveals: Finger taps are moderately impaired on the right and mildly impaired on the left. Hand movements are moderately impaired on the right and mildly impaired on the left. RAP (rapid alternating patting) is moderately impaired on the right and moderately impaired on the left. Foot taps are moderately impaired on the right and mildly impaired on the left. Foot agility (in the form of heel stomping) is mildly impaired on the right and mildly impaired on the left.    Cerebellar testing shows no dysmetria or intention tremor on finger to nose testing. There is no truncal or gait ataxia. Heel to shin is unremarkable b/l.   Sensory exam is intact to light touch, PP, vibration sense and temperature sense in the UEs and LEs bilaterally.  Gait, station and balance: He stands up from the seated position with no significance difficulty and needs no assistance. He does feel mild lightheadedness. No veering to one side is noted. He is not noted to lean to the side. Posture is mildly stooped. No lightheadedness reported. Stance is narrow-based. He walks with decrease in stride length and pace and decreased arm swing on the right and mild dystonic posturing with the right arm and hand, unchanged. He turns in 3 steps. Tandem walk is not possible. Balance is mildly impaired.   Assessment and Plan:   In summary, RAEDEN SCHIPPERS is a very pleasant 69 year old male with a history of  right-sided predominant Parkinson's disease of the  akinetic-rigid type, complicated by a syncopal spell in September 2014 and then recently again earlier this month for which he had full workup and has been seeing cardiology. He may very well have Parkinson's related autonomic dysregulation which often results in supine hypertension and sudden drops in blood pressure and falls and often some injuries. He has had fairly normal values of his blood pressure at baseline but in March 2015 he did present with low pressure values. I did have a email conversation with his cardiologist, Dr. Debara Pickett regarding his condition. Cardiac workup was benign. He did not have any orthostatic blood pressure drop recently in his office. I think we can do additional measures to improve his condition before we resort to trying something like Droxidopa. He needs to start using his compression stockings every day all day and remove them at night. He is advised to drink more water, about 6 glasses a day and gradually cut back on his sodas and tea. He is advised to elevate his head of bed by about 30 to sleep. Supine hypertension is a often seen phenomena in and Parkinson's patients. I would like to see some supine blood pressure values. I've asked him to purchase a blood pressure monitor and check his supine blood pressure first thing in the mornings a few times and keep a log and bring it in next time. He is advised to eat at regular intervals, change positions slowly, avoid heat exposure, stay better hydrated, use a stool for working in the yard and not work more than half an hour at a time without a break. He is exam is overall stable which is reassuring. I suggested we keep him on the same medication regimen. I renewed his prescriptions for Azilect, Sinemet and ropinirole today. I would like to see him back next month when he sees our nurse practitioner and then I will see him back 3 months after that. We will review his blood pressure, his blood pressure log and his symptoms at the time.  I answered all his questions today and the patient was given written instructions. In addition since he was complaining of not being able to sleep at night I suggested he try melatonin, 3-5 mg 1-2 hours before bedtime. I would avoid sedating medications or anything that could lower his blood pressure or impair his balance.  I spent 40 min in total face-to-face time with the patient, more 50% of which was spent in counseling and coordination of care, reviewing test results, reviewing medication and reviewing the diagnosis of PD and orthostatic hypotension, autonomic dysregulation, its prognosis and treatment options.

## 2014-04-15 NOTE — Patient Instructions (Addendum)
We will keep your medications the same.  For your passing out spells, we will do the following:   1. You HAVE to drink more water! 6 glasses at least and cut back on soda and tea.  2. You need to use the compression stockings every day, not at night.  3. You need to elevate your head of bed to 30 degrees, by using a wedge or a firm pillow.  4. Please get a blood pressure monitor for home use, and measure your BP lying down, first thing in the morning, before you get up and keep a log. Measure a few times, such as every other day for about a week or 10 days and bring the log next time.  5. You can try Melatonin at night for sleep: take 3 to 5 mg one to 2 hours before your bedtime.  6. Please continue to walk daily.  7. I will see you next month with Ms. Clabe Seal. 8. I will see you in 4 months.

## 2014-04-16 ENCOUNTER — Encounter: Payer: Self-pay | Admitting: Emergency Medicine

## 2014-04-16 ENCOUNTER — Ambulatory Visit (INDEPENDENT_AMBULATORY_CARE_PROVIDER_SITE_OTHER): Payer: Medicare Other | Admitting: Emergency Medicine

## 2014-04-16 VITALS — BP 115/71 | HR 61 | Temp 97.6°F | Resp 16 | Ht 66.5 in | Wt 126.0 lb

## 2014-04-16 DIAGNOSIS — Z0000 Encounter for general adult medical examination without abnormal findings: Secondary | ICD-10-CM

## 2014-04-16 DIAGNOSIS — I7300 Raynaud's syndrome without gangrene: Secondary | ICD-10-CM | POA: Insufficient documentation

## 2014-04-16 DIAGNOSIS — Z13220 Encounter for screening for lipoid disorders: Secondary | ICD-10-CM | POA: Diagnosis not present

## 2014-04-16 DIAGNOSIS — Z125 Encounter for screening for malignant neoplasm of prostate: Secondary | ICD-10-CM | POA: Diagnosis not present

## 2014-04-16 DIAGNOSIS — G20 Parkinson's disease: Secondary | ICD-10-CM | POA: Diagnosis not present

## 2014-04-16 DIAGNOSIS — H6982 Other specified disorders of Eustachian tube, left ear: Secondary | ICD-10-CM | POA: Diagnosis not present

## 2014-04-16 DIAGNOSIS — H9192 Unspecified hearing loss, left ear: Secondary | ICD-10-CM

## 2014-04-16 LAB — POCT UA - MICROSCOPIC ONLY
CASTS, UR, LPF, POC: NEGATIVE
Crystals, Ur, HPF, POC: NEGATIVE
Epithelial cells, urine per micros: NEGATIVE
Mucus, UA: POSITIVE
WBC, Ur, HPF, POC: NEGATIVE
Yeast, UA: NEGATIVE

## 2014-04-16 LAB — LIPID PANEL
CHOL/HDL RATIO: 2.6 ratio
CHOLESTEROL: 179 mg/dL (ref 0–200)
HDL: 69 mg/dL (ref >=40)
LDL Cholesterol: 99 mg/dL (ref 0–99)
Triglycerides: 55 mg/dL (ref <150)
VLDL: 11 mg/dL (ref 0–40)

## 2014-04-16 LAB — COMPLETE METABOLIC PANEL WITH GFR
ALBUMIN: 4.1 g/dL (ref 3.5–5.2)
ALT: 10 U/L (ref 0–53)
AST: 30 U/L (ref 0–37)
Alkaline Phosphatase: 88 U/L (ref 39–117)
BUN: 20 mg/dL (ref 6–23)
CO2: 27 meq/L (ref 19–32)
Calcium: 9.3 mg/dL (ref 8.4–10.5)
Chloride: 105 mEq/L (ref 96–112)
Creat: 0.99 mg/dL (ref 0.50–1.35)
GFR, Est Non African American: 79 mL/min
GLUCOSE: 83 mg/dL (ref 70–99)
POTASSIUM: 4.2 meq/L (ref 3.5–5.3)
SODIUM: 140 meq/L (ref 135–145)
Total Bilirubin: 0.5 mg/dL (ref 0.2–1.2)
Total Protein: 7 g/dL (ref 6.0–8.3)

## 2014-04-16 LAB — POCT URINALYSIS DIPSTICK
Bilirubin, UA: NEGATIVE
Blood, UA: NEGATIVE
Glucose, UA: NEGATIVE
KETONES UA: NEGATIVE
Leukocytes, UA: NEGATIVE
Nitrite, UA: NEGATIVE
PH UA: 5
Protein, UA: NEGATIVE
SPEC GRAV UA: 1.02
Urobilinogen, UA: 0.2

## 2014-04-16 LAB — IFOBT (OCCULT BLOOD): IFOBT: NEGATIVE

## 2014-04-16 NOTE — Progress Notes (Signed)
   Subjective:    Patient ID: [name], male    DOB: August 03, 1945, 69 y.o.   MRN: [id number] This chart was scribed for Arlyss Queen, MD by Zola Button, Medical Scribe. This patient was seen in room 23 and the patient's care was started at 8:55 AM.   HPI HPI Comments: [name] is a 69 y.o. male with a hx of Parkinson's disease who presents to the [facility] for a complete physical exam. Patient has followed up with neurology and cardiology following his recent episode of syncope; they believed it was related to his Parkinson's disease.  Patient states he has been dealing with problems with his ears for several years. Most of the time, he feels as if his ears are going through pressure changes, causing them to feel stopped up and also affects his hearing. He has seen an ENT specialist for this in the past, but he states he/she was not helpful.   Patient notes a bluish disclored area on his left thumb, but he denies any pain to the area. He believes he may have Raynaud's.  He has an upcoming colonoscopy scheduled for this summer. He has a PSHx of hernia repair about 6 years ago.  Neurologist: Dr. Rexene Alberts Cardiologist: Dr. Modesta Messing doctor: Dr. Sabra Heck  Review of Systems  HENT: Positive for ear pain and hearing loss.   Skin: Positive for color change.       Objective:   Physical Exam CONSTITUTIONAL: Well developed/well nourished HEAD: Normocephalic/atraumatic EYES: EOM/PERRL ENMT: Mucous membranes moist NECK: supple no meningeal signs SPINE: entire spine nontender CV: S1/S2 noted, no murmurs/rubs/gallops noted LUNGS: Lungs are clear to auscultation bilaterally, no apparent distress ABDOMEN: soft, nontender, no rebound or guarding GU: no cva tenderness NEURO: Cranial nerves intact. Minimal facial expression. Mild muscle rigidity. Left thumb and both toes are bluish discolored and slightly cool.  EXTREMITIES: pulses normal, full ROM SKIN: warm, color  normal PSYCH: no abnormalities of mood noted        Assessment & Plan:   Patient has been to the neurologist and to his cardiologist. He does have some signs of Raynaud's disease. We decided not to put him on medication for this at the present time. Routine labs were done we'll contact him once they return.

## 2014-04-16 NOTE — Patient Instructions (Signed)
Raynaud's Syndrome Raynaud's Syndrome is a disorder of the blood vessels in your hands and feet. It occurs when small arteries of the arms/hands or legs/feet become sensitive to cold or emotional upset. This causes the arteries to constrict, or narrow, and reduces blood flow to the area. The color in the fingers or toes changes from white to bluish to red and this is not usually painful. There may be numbness and tingling. Sores on the skin (ulcers) can form. Symptoms are usually relieved by warming. HOME CARE INSTRUCTIONS   Avoid exposure to cold. Keep your whole body warm and dry. Dress in layers. Wear mittens or gloves when handling ice or frozen food and when outdoors. Use holders for glasses or cans containing cold drinks. If possible, stay indoors during cold weather.  Limit your use of caffeine. Switch to decaffeinated coffee, tea, and soda pop. Avoid chocolate.  Avoid smoking or being around cigarette smoke. Smoke will make symptoms worse.  Wear loose fitting socks and comfortable, roomy shoes.  Avoid vibrating tools and machinery.  If possible, avoid stressful and emotional situations. Exercise, meditation and yoga may help you cope with stress. Biofeedback may be useful.  Ask your caregiver about medicine (calcium channel blockers) that may control Raynaud's phenomena. SEEK MEDICAL CARE IF:   Your discomfort becomes worse, despite conservative treatment.  You develop sores on your fingers and toes that do not heal. Document Released: 02/04/2000 Document Revised: 05/01/2011 Document Reviewed: 02/11/2008 Exit[employer] Patient Information 2015 [employer]. This information is not intended to replace advice given to you by your health care provider. Make sure you discuss any questions you have with your health care provider.  

## 2014-04-16 NOTE — Progress Notes (Deleted)
   Subjective:    Patient ID: [name], male    DOB: 1945-11-15, 69 y.o.   MRN: [id number]  HPI    Review of Systems     Objective:   Physical Exam        Assessment & Plan:

## 2014-04-17 LAB — PSA, MEDICARE: PSA: 2.07 ng/mL (ref <=4.00)

## 2014-04-21 ENCOUNTER — Other Ambulatory Visit (HOSPITAL_COMMUNITY): Payer: Self-pay | Admitting: Neurology

## 2014-04-21 DIAGNOSIS — G20 Parkinson's disease: Secondary | ICD-10-CM

## 2014-04-29 ENCOUNTER — Telehealth: Payer: Self-pay | Admitting: Neurology

## 2014-04-29 ENCOUNTER — Ambulatory Visit (HOSPITAL_COMMUNITY)
Admission: RE | Admit: 2014-04-29 | Discharge: 2014-04-29 | Disposition: A | Discharge status: Home / Self Care | Payer: Medicare Other | Source: Ambulatory Visit | Attending: Neurology | Admitting: Neurology

## 2014-04-29 DIAGNOSIS — D649 Anemia, unspecified: Secondary | ICD-10-CM | POA: Insufficient documentation

## 2014-04-29 DIAGNOSIS — E079 Disorder of thyroid, unspecified: Secondary | ICD-10-CM | POA: Insufficient documentation

## 2014-04-29 DIAGNOSIS — I7300 Raynaud's syndrome without gangrene: Secondary | ICD-10-CM | POA: Diagnosis not present

## 2014-04-29 DIAGNOSIS — G20 Parkinson's disease: Secondary | ICD-10-CM

## 2014-04-29 DIAGNOSIS — R1313 Dysphagia, pharyngeal phase: Secondary | ICD-10-CM | POA: Insufficient documentation

## 2014-04-29 DIAGNOSIS — R1310 Dysphagia, unspecified: Secondary | ICD-10-CM | POA: Diagnosis not present

## 2014-04-29 DIAGNOSIS — F329 Major depressive disorder, single episode, unspecified: Secondary | ICD-10-CM | POA: Insufficient documentation

## 2014-04-29 NOTE — Telephone Encounter (Signed)
Talked to [facility] and he has the requirements. And states that he understands.

## 2014-04-29 NOTE — Procedures (Signed)
Objective Swallowing Evaluation: Modified Barium Swallowing Study  Patient Details  Name: [name] MRN: [id number] Date of Birth: 11-29-1945  Today's Date: 04/29/2014 Time: SLP Start Time (ACUTE ONLY): 1310-SLP Stop Time (ACUTE ONLY): 1415 SLP Time Calculation (min) (ACUTE ONLY): 65 min  Past Medical History:  Past Medical History  Diagnosis Date  . Thyroid disease   . Allergy   . Depression   . Parkinson's disease   . Anemia   . Parkinson's disease 06/05/2012  . Raynaud's syndrome    Past Surgical History:  Past Surgical History  Procedure Laterality Date  . Mandible fracture surgery    . Hernia repair    . Wrist fracture surgery    . Rotator cuff repair    . Tonsillectomy    . Fracture surgery      jaw  . Vasectomy    . Appendectomy     HPI:  HPI: This 69 year old male was referred by Dr. Rexene Alberts for OP MBS due to complaints of choking during meals, and food sticking in his throat, which also causes difficulty speaking (voice changes). Pt states this began approximately one year ago and has progressively worsened.  PMH:  Parkinson's disease (dx'd 6 years ago).  Pt reports he walks six miles per day.  No Data Recorded  Assessment / Plan / Recommendation CHL IP CLINICAL IMPRESSIONS 04/29/2014  Dysphagia Diagnosis Moderate pharyngeal phase dysphagia  Clinical impression Pt exhibits a moderate pharyngeal phase dysphagia as evidenced by delayed swallow initiation, decreased base of tongue contraction, reduced laryngeal elevation, decreased vocal cord adduction, and decreased pharyngeal sensation.  These deficits result in penetration of thin liquids into the laryngeal vestibule that did not clear (pt unaware of difficulty), and silent aspriation when a chin tuck was attempted.  There was residue in the valleculae with most all consistencies, so of which cleared with a repeat dry swallow.  Pt had no sensation of aspiration, therefore, there was no spontaneous cough or throat  clearing during this study.  Pt does sense the vallecular residue, and may cough if he aspirates a larger amount.  A right head turn was attempted during the swallow, as pt states his right side is weaker, however, this did not appear to improve swallow function.  Esophageal residue was noted with puree, and the pill remained briefly, until more liquid propelled it through.  ?decreased perestalsis.  The radiologist declined to comment without an esophagram.  In depth education was provided re: these results, recommendations, use of the [employer] Protocol, how to thicken liquids, and s/s of pneumonia.  Pt denies having had pna thus far.  The fact that he walks/exercises dailty is likely a positive factor in preventing aspiration pna.  As the disease progresses, aspiration will likely be a more detrimental issue.  Pt would benefit from OP SLP for dysarthria as well as dysphagia Tomasa Rand technique).  Please consider a referral to Delanna Notice at [facility].      CHL IP TREATMENT RECOMMENDATION 04/29/2014  Treatment Plan Recommendations Defer treatment plan to SLP at (Comment)     CHL IP DIET RECOMMENDATION 04/29/2014  Diet Recommendations Dysphagia 3 (Mechanical Soft);Nectar-thick liquid  Liquid Administration via Cup;No straw  Medication Administration Whole meds with puree  Compensations Slow rate;Small sips/bites;Multiple dry swallows after each bite/sip;Follow solids with liquid  Postural Changes and/or Swallow Maneuvers Seated upright 90 degrees;Upright 30-60 min after meal     CHL IP OTHER RECOMMENDATIONS 04/29/2014  Recommended Consults (None)  Oral Care Recommendations Oral care BID  Other Recommendations Order thickener from pharmacy;Clarify dietary restrictions     CHL IP FOLLOW UP RECOMMENDATIONS 04/29/2014  Follow up Recommendations Outpatient SLP     No flowsheet data found.   Pertinent Vitals/Pain n/a          CHL IP REASON FOR REFERRAL 04/29/2014  Reason for  Referral Objectively evaluate swallowing function     CHL IP ORAL PHASE 04/29/2014  Lips (None)  Tongue (None)  Mucous membranes (None)  Nutritional status (None)  Other (None)  Oxygen therapy (None)  Oral Phase WFL  Oral - Pudding Teaspoon (None)  Oral - Pudding Cup (None)  Oral - Honey Teaspoon (None)  Oral - Honey Cup (None)  Oral - Honey Syringe (None)  Oral - Nectar Teaspoon (None)  Oral - Nectar Cup (None)  Oral - Nectar Straw (None)  Oral - Nectar Syringe (None)  Oral - Ice Chips (None)  Oral - Thin Teaspoon (None)  Oral - Thin Cup (None)  Oral - Thin Straw (None)  Oral - Thin Syringe (None)  Oral - Puree (None)  Oral - Mechanical Soft (None)  Oral - Regular (None)  Oral - Multi-consistency (None)  Oral - Pill (None)  Oral Phase - Comment (None)      CHL IP PHARYNGEAL PHASE 04/29/2014  Pharyngeal Phase Impaired  Pharyngeal - Pudding Teaspoon (None)  Penetration/Aspiration details (pudding teaspoon) (None)  Pharyngeal - Pudding Cup (None)  Penetration/Aspiration details (pudding cup) (None)  Pharyngeal - Honey Teaspoon (None)  Penetration/Aspiration details (honey teaspoon) (None)  Pharyngeal - Honey Cup (None)  Penetration/Aspiration details (honey cup) (None)  Pharyngeal - Honey Syringe (None)  Penetration/Aspiration details (honey syringe) (None)  Pharyngeal - Nectar Teaspoon (None)  Penetration/Aspiration details (nectar teaspoon) (None)  Pharyngeal - Nectar Cup Delayed swallow initiation;Premature spillage to valleculae;Reduced laryngeal elevation;Reduced tongue base retraction;Pharyngeal residue - valleculae  Penetration/Aspiration details (nectar cup) (None)  Pharyngeal - Nectar Straw (None)  Penetration/Aspiration details (nectar straw) (None)  Pharyngeal - Nectar Syringe (None)  Penetration/Aspiration details (nectar syringe) (None)  Pharyngeal - Ice Chips (None)  Penetration/Aspiration details (ice chips) (None)  Pharyngeal - Thin Teaspoon (None)   Penetration/Aspiration details (thin teaspoon) (None)  Pharyngeal - Thin Cup Delayed swallow initiation;Premature spillage to valleculae;Reduced laryngeal elevation;Reduced airway/laryngeal closure;Penetration/Aspiration during swallow;Moderate aspiration;Compensatory strategies attempted (Comment)  Penetration/Aspiration details (thin cup) Material enters airway, passes BELOW cords without attempt by patient to eject out (silent aspiration)  Pharyngeal - Thin Straw (None)  Penetration/Aspiration details (thin straw) (None)  Pharyngeal - Thin Syringe (None)  Penetration/Aspiration details (thin syringe') (None)  Pharyngeal - Puree Delayed swallow initiation;Premature spillage to valleculae;Reduced laryngeal elevation;Reduced anterior laryngeal mobility;Reduced tongue base retraction;Pharyngeal residue - valleculae  Penetration/Aspiration details (puree) (None)  Pharyngeal - Mechanical Soft (None)  Penetration/Aspiration details (mechanical soft) (None)  Pharyngeal - Regular Premature spillage to valleculae;Delayed swallow initiation;Reduced laryngeal elevation;Reduced tongue base retraction;Pharyngeal residue - valleculae  Penetration/Aspiration details (regular) (None)  Pharyngeal - Multi-consistency (None)  Penetration/Aspiration details (multi-consistency) (None)  Pharyngeal - Pill (None)  Penetration/Aspiration details (pill) (None)  Pharyngeal Comment (None)     CHL IP CERVICAL ESOPHAGEAL PHASE 04/29/2014  Cervical Esophageal Phase WFL  Pudding Teaspoon (None)  Pudding Cup (None)  Honey Teaspoon (None)  Honey Cup (None)  Honey Syringe (None)  Nectar Teaspoon (None)  Nectar Cup (None)  Nectar Straw (None)  Nectar Syringe (None)  Thin Teaspoon (None)  Thin Cup (None)  Thin Straw (None)  Thin Syringe (None)  Cervical Esophageal Comment (None)  CHL IP GO 04/29/2014  Functional Assessment Tool Used Clinical judgement  Functional Limitations Swallowing  Swallow Current Status  [phone]) CK  Swallow Goal Status (X6468) CK  Swallow Discharge Status (E3212) CK  Motor Speech Current Status (Y4825) (None)  Motor Speech Goal Status (O0370) (None)  Motor Speech Goal Status (W8889) (None)  Spoken Language Comprehension Current Status (V6945) (None)  Spoken Language Comprehension Goal Status (W3888) (None)  Spoken Language Comprehension Discharge Status [phone] (None)  Spoken Language Expression Current Status [phone]) (None)  Spoken Language Expression Goal Status (X5056) (None)  Spoken Language Expression Discharge Status [phone] (None)  Attention Current Status (A1655) (None)  Attention Goal Status (V7482) (None)  Attention Discharge Status [phone]) (None)  Memory Current Status (L5449) (None)  Memory Goal Status (E0100) (None)  Memory Discharge Status (F1219) (None)  Voice Current Status (X5883) (None)  Voice Goal Status (G5498) (None)  Voice Discharge Status (Y6415) (None)  Other Speech-Language Pathology Functional Limitation [phone]) (None)  Other Speech-Language Pathology Functional Limitation Goal Status (M7680) (None)  Other Speech-Language Pathology Functional Limitation Discharge Status [phone]) (None)           Quinn Axe T 04/29/2014, 2:35 PM

## 2014-04-29 NOTE — Telephone Encounter (Signed)
Left message to call us back

## 2014-04-29 NOTE — Telephone Encounter (Signed)
Patient had swallow evaluation today. He probably did get to speak with a speech pathologist but I do want to reiterate the recommendations they had: Food is recommended to be mechanical soft, liquid to be nectar thick, no straw, medications to be taken with pure. He is advised to eat slowly and take smaller sips and bites, multiple dry swallows after each bite, follow solids with sips of liquid, sit upright to eat at 90 and stay upright for 30-60 minutes after eating. Please reiterate to patient.

## 2014-05-07 ENCOUNTER — Ambulatory Visit: Payer: Self-pay | Admitting: Neurology

## 2014-05-14 ENCOUNTER — Ambulatory Visit (INDEPENDENT_AMBULATORY_CARE_PROVIDER_SITE_OTHER): Payer: Medicare Other | Admitting: Internal Medicine

## 2014-05-14 ENCOUNTER — Ambulatory Visit (INDEPENDENT_AMBULATORY_CARE_PROVIDER_SITE_OTHER): Payer: Medicare Other | Admitting: Adult Health

## 2014-05-14 ENCOUNTER — Encounter: Payer: Self-pay | Admitting: Internal Medicine

## 2014-05-14 ENCOUNTER — Encounter: Payer: Self-pay | Admitting: Adult Health

## 2014-05-14 VITALS — BP 115/72 | HR 58 | Ht 67.0 in | Wt 127.0 lb

## 2014-05-14 VITALS — BP 102/60 | HR 68 | Ht 68.0 in | Wt 125.7 lb

## 2014-05-14 DIAGNOSIS — R55 Syncope and collapse: Secondary | ICD-10-CM

## 2014-05-14 DIAGNOSIS — I951 Orthostatic hypotension: Secondary | ICD-10-CM | POA: Diagnosis not present

## 2014-05-14 DIAGNOSIS — G2581 Restless legs syndrome: Secondary | ICD-10-CM | POA: Diagnosis not present

## 2014-05-14 DIAGNOSIS — G4700 Insomnia, unspecified: Secondary | ICD-10-CM | POA: Diagnosis not present

## 2014-05-14 DIAGNOSIS — G20 Parkinson's disease: Secondary | ICD-10-CM | POA: Diagnosis not present

## 2014-05-14 NOTE — Progress Notes (Addendum)
PATIENT: [name] DOB: December 14, 1945  REASON FOR VISIT: follow up-Parkinson's disease, sleep disorder, restless leg syndrome, syncope HISTORY FROM: patient  HISTORY OF PRESENT ILLNESS: Mr. Bennis is a 69 year old male with a history of Parkinson's disease, sleep disorder, restless leg syndrome and syncope. He returns today for follow-up. The patient continues to take Azilect, Requip and Sinemet with good benefit. The patient has been checking his blood pressure at home. His blood pressure has ranged from 122/82- 138/84. The patient states that he has tried to increase his water consumption daily. He continues to walk daily. He denies any additional syncopal episodes. The patient primarily has right-sided symptoms with his Parkinson's. He denies a tremor. Denies any significant changes with his gait or balance. Denies any falls. The patient had a swallow evaluation that recommended a mechanical soft diet with nectar thick liquids. The patient states that he has been trying to follow this but has not been consistent. He states that he is been trying to eat slower to avoid getting strangled on foods. He states that if he eats things such as bread he will tend to get choked. The patient continues to have trouble sleeping at night. He states that he tried melatonin but was not consistent with it. He returns today for an evaluation.  HISTORY: Mr. Seyfried is a very pleasant 69 year old right-handed gentleman with an underlying medical history of hypothyroidism, OSA (not using CPAP currently, but tried in the past), RLS, MCI, CTS, depression, anemia, and allergies, who presents for followup consultation of his right-sided predominant, akinetic-rigid type Parkinson's disease, complicated by sleep disorder, RLS, memory loss, prior hallucinations, prior fall with injury, and syncope. He is unaccompanied today. I last saw him on 05/12/13, at which time he reported lightheadedness and his BP was in fact low. He  denies any recent syncopal spells and no falls. He stopped Toviaz some 4 months prior (due to cost). He denied recent illness and recent medication changes. He had some word finding difficulty. He was driving and had not had recent issues driving. He had no constipation or hallucinations and was sleeping well.  In the interim, he was seen by our nurse practitioner, Ms. Clabe Seal on 11/13/2013, at which time he was stable, his blood pressure was stable, and he was kept on the same medications including Azilect, Sinemet and Requip.  In the interim, he was hospitalized from 03/30/2014 through 04/01/2014 for her syncopal spell. He had a syncopal event at home on 03/30/2014 and found himself down after having a warning sign of dizziness, and sweating around 6:54 PM. He went to see his primary care physician and was advised to come in to the emergency room. He had another presyncopal spell the same day. I reviewed the hospital records including the discharge summary. He had a echocardiogram on 03/31/2014 which showed mild tricuspid regurgitation, EF of 65-70%, no regional wall motion abnormalities, mild aortic regurgitation, mild mitral regurgitation, and mild concentric left ventricular hypertrophy. Carotid Doppler studies from 04/01/2014 showed: 1-39% internal carotid artery stenosis bilaterally. There was antegrade vertebral artery flow. He had a head CT without contrast on 03/30/2014: 1. Negative intracranial imaging. 2. Remote subcondylar left mandible fracture with chronic TMJ dislocation. In addition, I personally reviewed the images through the PACS system.He was discharged home. He was seen by his cardiologist on 04/09/2014, and did not have any orthostatic blood pressure drops. He had had systolic blood pressures in the 160s in the hospital. His blood pressure at the time of his  office visit with Dr. Debara Pickett was 132/87. Dr. Debara Pickett felt that he would not be a good candidate for midodrine in the context of no  significant orthostatic blood pressure drop and normal blood pressure values at baseline. He emailed me regarding a trial of Droxidopa. The patient was advised to start using compression stockings which were previously recommended to him but he did not follow through with that.  Today, he reports feeling fine. He has no lightheadedness. He has not been using his compression stockings on in fact has not gotten them yet. His granddaughter brought him some but they are too small and too tight. He would get new ones. He does not drink enough water, maybe 1 glass a day. He likes to drink soda and tea. He walks about 6 miles a day, typically with his wife. His weight fluctuates. He has no new complaints. He feels motor-wise he has been stable.  I saw him on 11/12/2012, at which time he presented after a recent syncopal spell. I felt at the time that the syncope was secondary to a combination of things including autonomic dysregulation in the context of Parkinson's disease, exposure to heat for prolonged period of time with dehydration and quick changes in position as well as potential medication side effects. He had a head CT which was negative. He also had recent blood work. I did not change his medications at the time but talk to him about supportive measures including changing positions slowly, avoid prolonged heat exposure, staying well hydrated and using a stool for working in the yard and taking frequent breaks when working outside.    He had an event of LOC on 10/30/12. He reported that he had not been feeling well for the past 2 weeks prior to his syncope. He had been feeling dizzy especially with change of position with intermittent nausea reported. He had been in his yard working for several hours and became dizzy and had a blackout spell. He fell into the bushes, and came to after some seconds. He was bending up and down at lot. After he regained consciousness, he was feeling weak and eventually was able  to go to the porch, where he sat for about 45 minutes. His wife was at work. He had some slight confusion and lack of energy, but denied any focal weakness/CP/palpitations/SOB. He did not have any prior syncopal spells. He had blood work at the [facility] and saw Dr. Tamala Julian on 10/31/2012. His BUN was borderline at 24, he had a CTH on 10/31/12, which was negative and yesterday went for cardiological consultation with Dr. Debara Pickett, who felt, that he had a vasovagal event in the context of dysautonomia with PD.  I first met him on 06/05/12, at which time I felt his Parkinson's disease was stable and I did not make any medication changes.  He previously followed with Dr. Morene Antu and was last seen by him on 02/05/2012, at which time he was complaining of right hip pain. No medication changes were done at the time. He has been on Synthroid, Sinemet 25/100 mg strength one tablet 3 times a day, Requip 2 mg strength half a pill at 6:30, half a pill at noon, half a pill at 5 PM and one and a half a pill at 9 PM. He is on rasagiline 1 mg once daily, baby aspirin, multivitamin, Toviaz.  He received a steroid injection into the R hip in 8/14, which helped the pain. He worked for [employer]. He  lives with his wife in a one storey at [facility]. They have 2 grown children, son and daughter, both local. Two MAs and 4 cousins had Huntington's and he has been tested and was negative for it. Dr. Erling Cruz arranged the genetic testing. He has occasional depressive Sx, but better when he can get out and walk, he walks about 6 miles/day.  He was diagnosed with Parkinson's disease in July 2009, with symptoms going back a year prior and consisted of trouble with speech and eating. He was initially started on Requip, Azilect and and Sinemet. He had side effects from the Requip including excessive spending, daytime sleepiness and hypersexuality. His Requip dose was therefore decreased. He exercises regularly. He is independent in  his ADLs. He has had visual hallucinations. He fell in October 2011 fracturing his left distal radius, requiring surgery in December 2011. He had right-sided leg cramps improved with Flexeril, but stopped the medication. He denied leg edema, or compulsive behavior. He has bladder incontinence. In December 2013 his MMSE was 29, clock drawing was poor and animal fluency was 17, geriatric depression scale was 8/15.   REVIEW OF SYSTEMS: Out of a complete 14 system review of symptoms, the patient complains only of the following symptoms, and all other reviewed systems are negative.  Fatigue, hearing loss, ringing in ears, trouble swallowing, drooling, choking, excessive thirst, flushing, restless leg, frequent waking, back pain, moles, speech difficulty, confusion, depression  ALLERGIES: Allergies  Allergen Reactions  . Ibuprofen Other (See Comments)    Blood in stool.  . Lamictal [Lamotrigine] Other (See Comments)    Blisters in mouth  . Nsaids   . Penicillins     HOME MEDICATIONS: Outpatient Prescriptions Prior to Visit  Medication Sig Dispense Refill  . aspirin 81 MG tablet Take 160 mg by mouth daily.    . Biotin (BIOTIN 5000) 5 MG CAPS Take 1 capsule by mouth daily.    . carbidopa-levodopa (SINEMET IR) 25-100 MG per tablet Take 1 tablet by mouth 3 (three) times daily. 270 tablet 3  . levothyroxine (SYNTHROID, LEVOTHROID) 50 MCG tablet Take 1 tablet (50 mcg total) by mouth every other day. 90 tablet 0  . levothyroxine (SYNTHROID, LEVOTHROID) 75 MCG tablet Take 1 tablet (75 mcg total) by mouth every other day. 90 tablet 0  . Melatonin 5 MG TABS Take by mouth.    . Multiple Vitamins-Minerals (MULTIVITAMIN WITH MINERALS) tablet Take 1 tablet by mouth daily.    . rasagiline (AZILECT) 1 MG TABS tablet Take 1 tablet (1 mg total) by mouth daily. 30 tablet 5  . rOPINIRole (REQUIP) 2 MG tablet TAKE 1/2 TABLET BY MOUTH AT 6:30AM, 1/2 TABLET BY MOUTH AT NOON, 1/2 TABLET BY MOUTH AT 5PM, AND 1 1/2  TABLETS AT 9PM 270 tablet 3   No facility-administered medications prior to visit.    PAST MEDICAL HISTORY: Past Medical History  Diagnosis Date  . Thyroid disease   . Allergy   . Depression   . Parkinson's disease   . Anemia   . Parkinson's disease 06/05/2012  . Raynaud's syndrome     PAST SURGICAL HISTORY: Past Surgical History  Procedure Laterality Date  . Mandible fracture surgery    . Hernia repair    . Wrist fracture surgery    . Rotator cuff repair    . Tonsillectomy    . Fracture surgery      jaw  . Vasectomy    . Appendectomy      FAMILY HISTORY:  Family History  Problem Relation Age of Onset  . Cancer Father     colon    SOCIAL HISTORY: History   Social History  . Marital Status: Married    Spouse Name: [name]  . Number of Children: 2  . Years of Education: 12+   Occupational History  . DISABILITY     due to Parkinson's   Social History Main Topics  . Smoking status: Never Smoker   . Smokeless tobacco: Never Used  . Alcohol Use: 0.0 - 4.2 oz/week    0-7 Cans of beer per week     Comment: occas.  . Drug Use: No  . Sexual Activity: No   Other Topics Concern  . Not on file   Social History Narrative   Patient is married [name])  with 2 children.   Patient is right handed.   Patient has high school education.   Patient drinks 1-2 cups daily.      PHYSICAL EXAM  Filed Vitals:   05/14/14 1009  BP: 115/72  Pulse: 58  Height: _0  (1.702 m)  Weight: 127 lb (57.607 kg)   Body mass index is 19.89 kg/(m^2).  Generalized: Well developed, in no acute distress  Head: normocephalic and atraumatic. Oropharynx benign     Neurological examination  Mentation: Alert oriented to time, place, history taking. Follows all commands. Patient's speech is mildly hypophonic with some dysarthria noted. Cranial nerve II-XII: Pupils were equal round reactive to light extraocular movements were full, visual field were full on confrontational test.  Facial sensation and strength were normal. hearing was intact to finger rubbing bilaterally. Uvula tongue midline. Head turning and shoulder shrug  were normal and symmetric Motor: The motor testing reveals 5 over 5 strength of all 4 extremities. Good symmetric motor tone is noted throughout. Finger taps and foot taps are moderately impaired on the right and mildly impaired on the left. Sensory: Sensory testing is intact to pinprick, soft touch, vibration sensation, and position sense on all 4 extremities. No evidence of extinction is noted.  Coordination: Cerebellar testing reveals good finger-nose-finger and heel-to-shin bilaterally.  Gait and station: He is able to stand from a sitting position without assistance. He has dystonic posturing on the right when ambulating. Decreased arm swing on the right. Tandem gait is normal. Romberg is negative. No drift is seen.  Reflexes: Deep tendon reflexes are symmetric and normal bilaterally.    DIAGNOSTIC DATA (LABS, IMAGING, TESTING) - I reviewed patient records, labs, notes, testing and imaging myself where available.  Lab Results  Component Value Date   WBC 3.3* 03/31/2014   HGB 14.1 03/31/2014   HCT 42.8 03/31/2014   MCV 91.6 03/31/2014   PLT 200 03/31/2014      Component Value Date/Time   NA 140 04/16/2014 0928   K 4.2 04/16/2014 0928   CL 105 04/16/2014 0928   CO2 27 04/16/2014 0928   GLUCOSE 83 04/16/2014 0928   BUN 20 04/16/2014 0928   CREATININE 0.99 04/16/2014 0928   CREATININE 1.03 03/31/2014 0621   CALCIUM 9.3 04/16/2014 0928   PROT 7.0 04/16/2014 0928   ALBUMIN 4.1 04/16/2014 0928   AST 30 04/16/2014 0928   ALT 10 04/16/2014 0928   ALKPHOS 88 04/16/2014 0928   BILITOT 0.5 04/16/2014 0928   GFRNONAA 79 04/16/2014 0928   GFRNONAA 74* 03/31/2014 0621   GFRAA >89 04/16/2014 0928   GFRAA 85* 03/31/2014 0621   Lab Results  Component Value Date   CHOL 179  04/16/2014   HDL 69 04/16/2014   LDLCALC 99 04/16/2014   TRIG 55  04/16/2014   CHOLHDL 2.6 04/16/2014   Lab Results  Component Value Date   TSH 0.942 03/30/2014      ASSESSMENT AND PLAN 69 y.o. year old male  has a past medical history of Thyroid disease; Allergy; Depression; Parkinson's disease; Anemia; Parkinson's disease (06/05/2012); and Raynaud's syndrome. here with:  1. Parkinson's disease 2. Restless leg syndrome 3. Sleep disturbance 4. Syncope  The patient's Parkinson symptoms have been controlled. He will continue taking Azilect, Requip and Sinemet. The patient has not had any additional syncopal episodes since the last visit. His blood pressure has been in normal range. I have encouraged the patient to continue to increase his water consumption daily. I have also recommended that the patient follow the diet recommended by speech therapy. I explained the risk of silent aspiration and pneumonia. Patient verbalized understanding. I also did encourage the patient to use melatonin 5 mg at bedtime consistently. If his sleep does not improve he should let us know. Overall the patient symptoms have been stable since the last visit. He was advised that if his symptoms worsen he should let us know. Otherwise he will follow-up in 3 months with Dr. Carmelia Bake, MSN, NP-C 05/14/2014, 10:14 AM [facility] [address] [place], [place] [address] [phone]  Note: This document was prepared with digital dictation and possible smart phrase technology. Any transcriptional errors that result from this process are unintentional.  I reviewed the above note and documentation by the Nurse Practitioner and agree with the history, physical exam, assessment and plan as outlined above. I was immediately available for face-to-face consultation. Star Age, MD, PhD [facility])

## 2014-05-14 NOTE — Patient Instructions (Addendum)
Continue Azilect, Requip and Sinemet. Blood pressure has been good.  Continue to increase water consumption daily.  Recommend that you follow the diet recommended after your swallow evaluation.  Try taking Melatonin 5 mg at bedtime for sleep.  If your symptoms worsen please let us know.

## 2014-05-14 NOTE — Patient Instructions (Signed)
Your physician recommends that you schedule a follow-up appointment as needed  

## 2014-05-14 NOTE — Progress Notes (Signed)
OFFICE NOTE  Chief Complaint:  No complaints  Primary Care Physician: Kennon Portela, MD  HPI:  [name] this pleasant 69 year old male with unfortunate history of Parkinson's disease. He has no history of cardiac problems and no family history of significant heart disease. He is a lifetime nonsmoker and used no alcohol. He has no history of diabetes but does have a history of hypothyroidism. Recently he's noted that his blood pressure is running lower than normal. He had an episode a few weeks ago where he was bending over in his yard for long periods of time and picking up sticks and leaves. He noted that he been lightheaded and/or dizzy and felt an episode like he was going to fall out. Subsequently he did fall over and denies a lack of recognition of that event. He says that he was somewhat confused but realized he was on the ground and was too weak to get up right away. He eventually was able to get up on its on power after about 3 or 4 minutes. He's not had any episodes since then. The weather was quite hot that day and he had been working outside for periods of time. He is not currently on any blood pressure medications. He denied any chest pain or palpitations around the time of the event. He's had no further episodes since that event.  I had the pleasure of seeing [name] back in the office today. Previously I diagnosed him with autonomic dysfunction secondary to Parkinson's disease as the etiology of his postural hypotension and syncope. He apparently had another episode of near-syncope with symptoms of dysautonomia including sweating and became weak and fell to the floor. Cardiac workup was unremarkable except for some diastolic dysfunction. He denied any chest pain. Carotid Dopplers were negative. He currently is on treatment for his Parkinson's including Sinemet and has history of restless leg syndrome on ropinirole. Orthostatic blood pressures were checked in the office  today and were negative. His blood pressure actually was 132/87. In the hospital blood pressures were as high as 967 systolic.  [name] returns today for follow-up. He reports no further syncopal episodes. He has since followed up with his neurologist and I had a conversation with her regarding possible use of drugs he droxidopa. We'll start first with conservative measures including compression stockings, liberalizing salt in his diet, increasing water intake and of course I advised him to try to lay down as fast as possible if he felt that he was going to pass out.  PMHx:  Past Medical History  Diagnosis Date  . Thyroid disease   . Allergy   . Depression   . Parkinson's disease   . Anemia   . Parkinson's disease 06/05/2012  . Raynaud's syndrome     Past Surgical History  Procedure Laterality Date  . Mandible fracture surgery    . Hernia repair    . Wrist fracture surgery    . Rotator cuff repair    . Tonsillectomy    . Fracture surgery      jaw  . Vasectomy    . Appendectomy      FAMHx:  Family History  Problem Relation Age of Onset  . Cancer Father     colon    SOCHx:   reports that he has never smoked. He has never used smokeless tobacco. He reports that he drinks alcohol. He reports that he does not use illicit drugs.  ALLERGIES:  Allergies  Allergen  Reactions  . Ibuprofen Other (See Comments)    Blood in stool.  . Lamictal [Lamotrigine] Other (See Comments)    Blisters in mouth  . Nsaids   . Penicillins     ROS: A comprehensive review of systems was negative except for: Neurological: positive for dizziness and syncope  HOME MEDS: Current Outpatient Prescriptions  Medication Sig Dispense Refill  . aspirin 81 MG tablet Take 160 mg by mouth daily.    . Biotin (BIOTIN 5000) 5 MG CAPS Take 1 capsule by mouth daily.    . carbidopa-levodopa (SINEMET IR) 25-100 MG per tablet Take 1 tablet by mouth 3 (three) times daily. 270 tablet 3  . levothyroxine  (SYNTHROID, LEVOTHROID) 50 MCG tablet Take 1 tablet (50 mcg total) by mouth every other day. 90 tablet 0  . levothyroxine (SYNTHROID, LEVOTHROID) 75 MCG tablet Take 1 tablet (75 mcg total) by mouth every other day. 90 tablet 0  . Melatonin 5 MG TABS Take by mouth.    . Multiple Vitamins-Minerals (MULTIVITAMIN WITH MINERALS) tablet Take 1 tablet by mouth daily.    . rasagiline (AZILECT) 1 MG TABS tablet Take 1 tablet (1 mg total) by mouth daily. 30 tablet 5  . rOPINIRole (REQUIP) 2 MG tablet TAKE 1/2 TABLET BY MOUTH AT 6:30AM, 1/2 TABLET BY MOUTH AT NOON, 1/2 TABLET BY MOUTH AT 5PM, AND 1 1/2 TABLETS AT 9PM 270 tablet 3   No current facility-administered medications for this visit.    LABS/IMAGING: No results found for this or any previous visit (from the past 48 hour(s)). No results found.  VITALS: BP 102/60 mmHg  Pulse 68  Ht 5\' 8"  (1.727 m)  Wt 125 lb 11.2 oz (57.017 kg)  BMI 19.12 kg/m2  EXAM: Deferred  EKG: Deferred  ASSESSMENT: 1. Autonomic postural hypotension 2. Syncope 3. Parkinson's disease with likely dysautonomia  PLAN: 1.   Mr. Meegan seems to be doing pretty well currently. He is wearing his compression stockings. I've again encouraged him to liberalize salt in his diet and pre-much added to everything he eats. He should also stay well hydrated, especially in the summer and when he is outdoors. Hopefully he'll do well with conservative therapy, but if he should worsen, then he may be a candidate for droxidopa. This could be prescribed in conjunction with his Parkinson's medications through his neurologist.  I'm happy to see him back on an as-needed basis.  Pixie Casino, MD, [facility] Attending Cardiologist [facility] 05/14/2014, 4:29 PM

## 2014-05-25 DIAGNOSIS — H903 Sensorineural hearing loss, bilateral: Secondary | ICD-10-CM | POA: Diagnosis not present

## 2014-07-12 ENCOUNTER — Ambulatory Visit (INDEPENDENT_AMBULATORY_CARE_PROVIDER_SITE_OTHER): Payer: Medicare Other

## 2014-07-12 ENCOUNTER — Ambulatory Visit (INDEPENDENT_AMBULATORY_CARE_PROVIDER_SITE_OTHER): Payer: Medicare Other | Admitting: Family Medicine

## 2014-07-12 VITALS — BP 122/74 | HR 67 | Temp 98.0°F | Resp 17 | Ht 66.5 in | Wt 124.0 lb

## 2014-07-12 DIAGNOSIS — F32A Depression, unspecified: Secondary | ICD-10-CM

## 2014-07-12 DIAGNOSIS — F329 Major depressive disorder, single episode, unspecified: Secondary | ICD-10-CM | POA: Diagnosis not present

## 2014-07-12 DIAGNOSIS — G20 Parkinson's disease: Secondary | ICD-10-CM

## 2014-07-12 DIAGNOSIS — M5431 Sciatica, right side: Secondary | ICD-10-CM | POA: Diagnosis not present

## 2014-07-12 DIAGNOSIS — R350 Frequency of micturition: Secondary | ICD-10-CM

## 2014-07-12 LAB — POCT URINALYSIS DIPSTICK
Bilirubin, UA: NEGATIVE
Glucose, UA: NEGATIVE
Ketones, UA: NEGATIVE
LEUKOCYTES UA: NEGATIVE
Nitrite, UA: NEGATIVE
Protein, UA: NEGATIVE
RBC UA: NEGATIVE
SPEC GRAV UA: 1.02
UROBILINOGEN UA: 0.2
pH, UA: 5

## 2014-07-12 LAB — POCT UA - MICROSCOPIC ONLY
BACTERIA, U MICROSCOPIC: NEGATIVE
CASTS, UR, LPF, POC: NEGATIVE
CRYSTALS, UR, HPF, POC: NEGATIVE
EPITHELIAL CELLS, URINE PER MICROSCOPY: NEGATIVE
Mucus, UA: NEGATIVE
RBC, URINE, MICROSCOPIC: NEGATIVE
WBC, Ur, HPF, POC: NEGATIVE
YEAST UA: NEGATIVE

## 2014-07-12 MED ORDER — PREDNISONE 20 MG PO TABS: ORAL_TABLET | ORAL | Status: DC

## 2014-07-12 MED ORDER — METHOCARBAMOL 500 MG PO TABS: 500.0000 mg | ORAL_TABLET | Freq: Every day | ORAL | Status: DC

## 2014-07-12 NOTE — Patient Instructions (Signed)
Sciatica with Rehab The sciatic nerve runs from the back down the leg and is responsible for sensation and control of the muscles in the back (posterior) side of the thigh, lower leg, and foot. Sciatica is a condition that is characterized by inflammation of this nerve.  SYMPTOMS   Signs of nerve damage, including numbness and/or weakness along the posterior side of the lower extremity.  Pain in the back of the thigh that may also travel down the leg.  Pain that worsens when sitting for long periods of time.  Occasionally, pain in the back or buttock. CAUSES  Inflammation of the sciatic nerve is the cause of sciatica. The inflammation is due to something irritating the nerve. Common sources of irritation include:  Sitting for long periods of time.  Direct trauma to the nerve.  Arthritis of the spine.  Herniated or ruptured disk.  Slipping of the vertebrae (spondylolisthesis).  Pressure from soft tissues, such as muscles or ligament-like tissue (fascia). RISK INCREASES WITH:  Sports that place pressure or stress on the spine (football or weightlifting).  Poor strength and flexibility.  Failure to warm up properly before activity.  Family history of low back pain or disk disorders.  Previous back injury or surgery.  Poor body mechanics, especially when lifting, or poor posture. PREVENTION   Warm up and stretch properly before activity.  Maintain physical fitness:  Strength, flexibility, and endurance.  Cardiovascular fitness.  Learn and use proper technique, especially with posture and lifting. When possible, have coach correct improper technique.  Avoid activities that place stress on the spine. PROGNOSIS If treated properly, then sciatica usually resolves within 6 weeks. However, occasionally surgery is necessary.  RELATED COMPLICATIONS   Permanent nerve damage, including pain, numbness, tingle, or weakness.  Chronic back pain.  Risks of surgery: infection,  bleeding, nerve damage, or damage to surrounding tissues. TREATMENT Treatment initially involves resting from any activities that aggravate your symptoms. The use of ice and medication may help reduce pain and inflammation. The use of strengthening and stretching exercises may help reduce pain with activity. These exercises may be performed at home or with referral to a therapist. A therapist may recommend further treatments, such as transcutaneous electronic nerve stimulation (TENS) or ultrasound. Your caregiver may recommend corticosteroid injections to help reduce inflammation of the sciatic nerve. If symptoms persist despite non-surgical (conservative) treatment, then surgery may be recommended. MEDICATION  If pain medication is necessary, then nonsteroidal anti-inflammatory medications, such as aspirin and ibuprofen, or other minor pain relievers, such as acetaminophen, are often recommended.  Do not take pain medication for 7 days before surgery.  Prescription pain relievers may be given if deemed necessary by your caregiver. Use only as directed and only as much as you need.  Ointments applied to the skin may be helpful.  Corticosteroid injections may be given by your caregiver. These injections should be reserved for the most serious cases, because they may only be given a certain number of times. HEAT AND COLD  Cold treatment (icing) relieves pain and reduces inflammation. Cold treatment should be applied for 10 to 15 minutes every 2 to 3 hours for inflammation and pain and immediately after any activity that aggravates your symptoms. Use ice packs or massage the area with a piece of ice (ice massage).  Heat treatment may be used prior to performing the stretching and strengthening activities prescribed by your caregiver, physical therapist, or athletic trainer. Use a heat pack or soak the injury in warm water.   SEEK MEDICAL CARE IF:  Treatment seems to offer no benefit, or the condition  worsens.  Any medications produce adverse side effects. EXERCISES  RANGE OF MOTION (ROM) AND STRETCHING EXERCISES - Sciatica Most people with sciatic will find that their symptoms worsen with either excessive bending forward (flexion) or arching at the low back (extension). The exercises which will help resolve your symptoms will focus on the opposite motion. Your physician, physical therapist or athletic trainer will help you determine which exercises will be most helpful to resolve your low back pain. Do not complete any exercises without first consulting with your clinician. Discontinue any exercises which worsen your symptoms until you speak to your clinician. If you have pain, numbness or tingling which travels down into your buttocks, leg or foot, the goal of the therapy is for these symptoms to move closer to your back and eventually resolve. Occasionally, these leg symptoms will get better, but your low back pain may worsen; this is typically an indication of progress in your rehabilitation. Be certain to be very alert to any changes in your symptoms and the activities in which you participated in the 24 hours prior to the change. Sharing this information with your clinician will allow him/her to most efficiently treat your condition. These exercises may help you when beginning to rehabilitate your injury. Your symptoms may resolve with or without further involvement from your physician, physical therapist or athletic trainer. While completing these exercises, remember:   Restoring tissue flexibility helps normal motion to return to the joints. This allows healthier, less painful movement and activity.  An effective stretch should be held for at least 30 seconds.  A stretch should never be painful. You should only feel a gentle lengthening or release in the stretched tissue. FLEXION RANGE OF MOTION AND STRETCHING EXERCISES: STRETCH - Flexion, Single Knee to Chest   Lie on a firm bed or floor  with both legs extended in front of you.  Keeping one leg in contact with the floor, bring your opposite knee to your chest. Hold your leg in place by either grabbing behind your thigh or at your knee.  Pull until you feel a gentle stretch in your low back. Hold __________ seconds.  Slowly release your grasp and repeat the exercise with the opposite side. Repeat __________ times. Complete this exercise __________ times per day.  STRETCH - Flexion, Double Knee to Chest  Lie on a firm bed or floor with both legs extended in front of you.  Keeping one leg in contact with the floor, bring your opposite knee to your chest.  Tense your stomach muscles to support your back and then lift your other knee to your chest. Hold your legs in place by either grabbing behind your thighs or at your knees.  Pull both knees toward your chest until you feel a gentle stretch in your low back. Hold __________ seconds.  Tense your stomach muscles and slowly return one leg at a time to the floor. Repeat __________ times. Complete this exercise __________ times per day.  STRETCH - Low Trunk Rotation   Lie on a firm bed or floor. Keeping your legs in front of you, bend your knees so they are both pointed toward the ceiling and your feet are flat on the floor.  Extend your arms out to the side. This will stabilize your upper body by keeping your shoulders in contact with the floor.  Gently and slowly drop both knees together to one side until   you feel a gentle stretch in your low back. Hold for __________ seconds.  Tense your stomach muscles to support your low back as you bring your knees back to the starting position. Repeat the exercise to the other side. Repeat __________ times. Complete this exercise __________ times per day  EXTENSION RANGE OF MOTION AND FLEXIBILITY EXERCISES: STRETCH - Extension, Prone on Elbows  Lie on your stomach on the floor, a bed will be too soft. Place your palms about shoulder  width apart and at the height of your head.  Place your elbows under your shoulders. If this is too painful, stack pillows under your chest.  Allow your body to relax so that your hips drop lower and make contact more completely with the floor.  Hold this position for __________ seconds.  Slowly return to lying flat on the floor. Repeat __________ times. Complete this exercise __________ times per day.  RANGE OF MOTION - Extension, Prone Press Ups  Lie on your stomach on the floor, a bed will be too soft. Place your palms about shoulder width apart and at the height of your head.  Keeping your back as relaxed as possible, slowly straighten your elbows while keeping your hips on the floor. You may adjust the placement of your hands to maximize your comfort. As you gain motion, your hands will come more underneath your shoulders.  Hold this position __________ seconds.  Slowly return to lying flat on the floor. Repeat __________ times. Complete this exercise __________ times per day.  STRENGTHENING EXERCISES - Sciatica  These exercises may help you when beginning to rehabilitate your injury. These exercises should be done near your "sweet spot." This is the neutral, low-back arch, somewhere between fully rounded and fully arched, that is your least painful position. When performed in this safe range of motion, these exercises can be used for people who have either a flexion or extension based injury. These exercises may resolve your symptoms with or without further involvement from your physician, physical therapist or athletic trainer. While completing these exercises, remember:   Muscles can gain both the endurance and the strength needed for everyday activities through controlled exercises.  Complete these exercises as instructed by your physician, physical therapist or athletic trainer. Progress with the resistance and repetition exercises only as your caregiver advises.  You may  experience muscle soreness or fatigue, but the pain or discomfort you are trying to eliminate should never worsen during these exercises. If this pain does worsen, stop and make certain you are following the directions exactly. If the pain is still present after adjustments, discontinue the exercise until you can discuss the trouble with your clinician. STRENGTHENING - Deep Abdominals, Pelvic Tilt   Lie on a firm bed or floor. Keeping your legs in front of you, bend your knees so they are both pointed toward the ceiling and your feet are flat on the floor.  Tense your lower abdominal muscles to press your low back into the floor. This motion will rotate your pelvis so that your tail bone is scooping upwards rather than pointing at your feet or into the floor.  With a gentle tension and even breathing, hold this position for __________ seconds. Repeat __________ times. Complete this exercise __________ times per day.  STRENGTHENING - Abdominals, Crunches   Lie on a firm bed or floor. Keeping your legs in front of you, bend your knees so they are both pointed toward the ceiling and your feet are flat on the   floor. Cross your arms over your chest.  Slightly tip your chin down without bending your neck.  Tense your abdominals and slowly lift your trunk high enough to just clear your shoulder blades. Lifting higher can put excessive stress on the low back and does not further strengthen your abdominal muscles.  Control your return to the starting position. Repeat __________ times. Complete this exercise __________ times per day.  STRENGTHENING - Quadruped, Opposite UE/LE Lift  Assume a hands and knees position on a firm surface. Keep your hands under your shoulders and your knees under your hips. You may place padding under your knees for comfort.  Find your neutral spine and gently tense your abdominal muscles so that you can maintain this position. Your shoulders and hips should form a rectangle  that is parallel with the floor and is not twisted.  Keeping your trunk steady, lift your right hand no higher than your shoulder and then your left leg no higher than your hip. Make sure you are not holding your breath. Hold this position __________ seconds.  Continuing to keep your abdominal muscles tense and your back steady, slowly return to your starting position. Repeat with the opposite arm and leg. Repeat __________ times. Complete this exercise __________ times per day.  STRENGTHENING - Abdominals and Quadriceps, Straight Leg Raise   Lie on a firm bed or floor with both legs extended in front of you.  Keeping one leg in contact with the floor, bend the other knee so that your foot can rest flat on the floor.  Find your neutral spine, and tense your abdominal muscles to maintain your spinal position throughout the exercise.  Slowly lift your straight leg off the floor about 6 inches for a count of 15, making sure to not hold your breath.  Still keeping your neutral spine, slowly lower your leg all the way to the floor. Repeat this exercise with each leg __________ times. Complete this exercise __________ times per day. POSTURE AND BODY MECHANICS CONSIDERATIONS - Sciatica Keeping correct posture when sitting, standing or completing your activities will reduce the stress put on different body tissues, allowing injured tissues a chance to heal and limiting painful experiences. The following are general guidelines for improved posture. Your physician or physical therapist will provide you with any instructions specific to your needs. While reading these guidelines, remember:  The exercises prescribed by your provider will help you have the flexibility and strength to maintain correct postures.  The correct posture provides the optimal environment for your joints to work. All of your joints have less wear and tear when properly supported by a spine with good posture. This means you will  experience a healthier, less painful body.  Correct posture must be practiced with all of your activities, especially prolonged sitting and standing. Correct posture is as important when doing repetitive low-stress activities (typing) as it is when doing a single heavy-load activity (lifting). RESTING POSITIONS Consider which positions are most painful for you when choosing a resting position. If you have pain with flexion-based activities (sitting, bending, stooping, squatting), choose a position that allows you to rest in a less flexed posture. You would want to avoid curling into a fetal position on your side. If your pain worsens with extension-based activities (prolonged standing, working overhead), avoid resting in an extended position such as sleeping on your stomach. Most people will find more comfort when they rest with their spine in a more neutral position, neither too rounded nor too   arched. Lying on a non-sagging bed on your side with a pillow between your knees, or on your back with a pillow under your knees will often provide some relief. Keep in mind, being in any one position for a prolonged period of time, no matter how correct your posture, can still lead to stiffness. PROPER SITTING POSTURE In order to minimize stress and discomfort on your spine, you must sit with correct posture Sitting with good posture should be effortless for a healthy body. Returning to good posture is a gradual process. Many people can work toward this most comfortably by using various supports until they have the flexibility and strength to maintain this posture on their own. When sitting with proper posture, your ears will fall over your shoulders and your shoulders will fall over your hips. You should use the back of the chair to support your upper back. Your low back will be in a neutral position, just slightly arched. You may place a small pillow or folded towel at the base of your low back for support.  When  working at a desk, create an environment that supports good, upright posture. Without extra support, muscles fatigue and lead to excessive strain on joints and other tissues. Keep these recommendations in mind: CHAIR:   A chair should be able to slide under your desk when your back makes contact with the back of the chair. This allows you to work closely.  The chair's height should allow your eyes to be level with the upper part of your monitor and your hands to be slightly lower than your elbows. BODY POSITION  Your feet should make contact with the floor. If this is not possible, use a foot rest.  Keep your ears over your shoulders. This will reduce stress on your neck and low back. INCORRECT SITTING POSTURES   If you are feeling tired and unable to assume a healthy sitting posture, do not slouch or slump. This puts excessive strain on your back tissues, causing more damage and pain. Healthier options include:  Using more support, like a lumbar pillow.  Switching tasks to something that requires you to be upright or walking.  Talking a brief walk.  Lying down to rest in a neutral-spine position. PROLONGED STANDING WHILE SLIGHTLY LEANING FORWARD  When completing a task that requires you to lean forward while standing in one place for a long time, place either foot up on a stationary 2-4 inch high object to help maintain the best posture. When both feet are on the ground, the low back tends to lose its slight inward curve. If this curve flattens (or becomes too large), then the back and your other joints will experience too much stress, fatigue more quickly and can cause pain.  CORRECT STANDING POSTURES Proper standing posture should be assumed with all daily activities, even if they only take a few moments, like when brushing your teeth. As in sitting, your ears should fall over your shoulders and your shoulders should fall over your hips. You should keep a slight tension in your abdominal  muscles to brace your spine. Your tailbone should point down to the ground, not behind your body, resulting in an over-extended swayback posture.  INCORRECT STANDING POSTURES  Common incorrect standing postures include a forward head, locked knees and/or an excessive swayback. WALKING Walk with an upright posture. Your ears, shoulders and hips should all line-up. PROLONGED ACTIVITY IN A FLEXED POSITION When completing a task that requires you to bend forward   at your waist or lean over a low surface, try to find a way to stabilize 3 of 4 of your limbs. You can place a hand or elbow on your thigh or rest a knee on the surface you are reaching across. This will provide you more stability so that your muscles do not fatigue as quickly. By keeping your knees relaxed, or slightly bent, you will also reduce stress across your low back. CORRECT LIFTING TECHNIQUES DO :   Assume a wide stance. This will provide you more stability and the opportunity to get as close as possible to the object which you are lifting.  Tense your abdominals to brace your spine; then bend at the knees and hips. Keeping your back locked in a neutral-spine position, lift using your leg muscles. Lift with your legs, keeping your back straight.  Test the weight of unknown objects before attempting to lift them.  Try to keep your elbows locked down at your sides in order get the best strength from your shoulders when carrying an object.  Always ask for help when lifting heavy or awkward objects. INCORRECT LIFTING TECHNIQUES DO NOT:   Lock your knees when lifting, even if it is a small object.  Bend and twist. Pivot at your feet or move your feet when needing to change directions.  Assume that you cannot safely pick up a paperclip without proper posture. Document Released: 02/06/2005 Document Revised: 06/23/2013 Document Reviewed: 05/21/2008 [employer] Patient Information 2015 [employer]. This information is not intended to  replace advice given to you by your health care provider. Make sure you discuss any questions you have with your health care provider.  

## 2014-07-12 NOTE — Progress Notes (Signed)
Subjective:    Patient ID: [name], male    DOB: 03-30-1945, 69 y.o.   MRN: [id number]  07/12/2014  Back Pain   HPI This 69 y.o. male presents for evaluation of R lower back pain.   Onset three weeks ago.  No overuse or injury.  +R sided back pain.  +radiation into R foot.  No n/t/burning.  +weakness chronic R sided due to Parkinsons with recent worsening.  Urinary frequency for several months; b/b function normal. No saddle paresthesias.  Severity 5/10.  No medications taken for pain.  No heat or ice.  Getting up in morning is worst pain.  No nighttime awakening.  Lady at [facility] noticed that pt leaning forward with ambulation.   Great grandchild born recently; to be staying with pt for next three months.  No rash.  Depression: abnormal depression screen upon presentation.  Chronic history of depressed mood due to declining health.  Denies SI/HI. Not interested in medication at this time. Previous psychotherapy with benefit; not interested in psychotherapy at this time.  Great grandchild to stay with patient for next three months; looking forward to family staying with him.  Taking iron daily for anemia; last Hgb 14.1 in 03/2014; last TSH 03/2014 and WNL.  Tired a lot but this is chronic without acute worsening; not interested in repeat blood work today.   Review of Systems  Constitutional: Negative for fever, chills, diaphoresis and fatigue.  Gastrointestinal: Negative for abdominal pain.  Genitourinary: Positive for frequency. Negative for dysuria, urgency, hematuria, flank pain, decreased urine volume, discharge, penile swelling, scrotal swelling, difficulty urinating, genital sores, penile pain and testicular pain.  Musculoskeletal: Positive for back pain and gait problem.  Skin: Negative for rash.  Neurological: Positive for weakness. Negative for dizziness, seizures, syncope, facial asymmetry, speech difficulty, light-headedness, numbness and headaches.  Psychiatric/Behavioral:  Positive for dysphoric mood. Negative for suicidal ideas and self-injury. The patient is not nervous/anxious.     Past Medical History  Diagnosis Date  . Thyroid disease   . Allergy   . Depression   . Parkinson's disease   . Anemia   . Parkinson's disease 06/05/2012  . Raynaud's syndrome    Past Surgical History  Procedure Laterality Date  . Mandible fracture surgery    . Hernia repair    . Wrist fracture surgery    . Rotator cuff repair    . Tonsillectomy    . Fracture surgery      jaw  . Vasectomy    . Appendectomy     Allergies  Allergen Reactions  . Ibuprofen Other (See Comments)    Blood in stool.  . Lamictal [Lamotrigine] Other (See Comments)    Blisters in mouth  . Nsaids   . Penicillins    Current Outpatient Prescriptions  Medication Sig Dispense Refill  . aspirin 81 MG tablet Take 160 mg by mouth daily.    . Biotin (BIOTIN 5000) 5 MG CAPS Take 1 capsule by mouth daily.    . carbidopa-levodopa (SINEMET IR) 25-100 MG per tablet Take 1 tablet by mouth 3 (three) times daily. 270 tablet 3  . levothyroxine (SYNTHROID, LEVOTHROID) 50 MCG tablet Take 1 tablet (50 mcg total) by mouth every other day. 90 tablet 0  . levothyroxine (SYNTHROID, LEVOTHROID) 75 MCG tablet Take 1 tablet (75 mcg total) by mouth every other day. 90 tablet 0  . Melatonin 5 MG TABS Take by mouth.    . Multiple Vitamins-Minerals (MULTIVITAMIN WITH MINERALS) tablet Take  1 tablet by mouth daily.    . rasagiline (AZILECT) 1 MG TABS tablet Take 1 tablet (1 mg total) by mouth daily. 30 tablet 5  . rOPINIRole (REQUIP) 2 MG tablet TAKE 1/2 TABLET BY MOUTH AT 6:30AM, 1/2 TABLET BY MOUTH AT NOON, 1/2 TABLET BY MOUTH AT 5PM, AND 1 1/2 TABLETS AT 9PM 270 tablet 3  . methocarbamol (ROBAXIN) 500 MG tablet Take 1-2 tablets (500-1,000 mg total) by mouth at bedtime. 40 tablet 0  . predniSONE (DELTASONE) 20 MG tablet Two tablets daily x 5 days then one tablet daily x 5 days 15 tablet 0   No current  facility-administered medications for this visit.       Objective:    BP 122/74 mmHg  Pulse 67  Temp(Src) 98 F (36.7 C) (Oral)  Resp 17  Ht 5' 6.5" (1.689 m)  Wt 124 lb (56.246 kg)  BMI 19.72 kg/m2  SpO2 98% Physical Exam  Constitutional: He is oriented to person, place, and time. He appears well-developed and well-nourished. No distress.  HENT:  Head: Normocephalic and atraumatic.  Eyes: Conjunctivae and EOM are normal. Pupils are equal, round, and reactive to light.  Neck: Normal range of motion. Neck supple. Carotid bruit is not present. No thyromegaly present.  Cardiovascular: Normal rate, regular rhythm, normal heart sounds and intact distal pulses.  Exam reveals no gallop and no friction rub.   No murmur heard. Pulmonary/Chest: Effort normal and breath sounds normal. He has no wheezes. He has no rales.  Musculoskeletal:       Right shoulder: He exhibits decreased range of motion and pain. He exhibits no tenderness, no bony tenderness, normal pulse and normal strength.  Lumbar spine:  Non-tender midline; non-tender paraspinal regions B.  Straight leg raises negative B; toe and heel walking intact; marching intact; motor 5/5 BLE.  Full ROM lumbar spine without limitation.  Pain with lumbar extension and rotating side to side.   Lymphadenopathy:    He has no cervical adenopathy.  Neurological: He is alert and oriented to person, place, and time. No cranial nerve deficit. He exhibits normal muscle tone.  Skin: Skin is warm and dry. No rash noted. He is not diaphoretic.  Psychiatric: He has a normal mood and affect. His behavior is normal. Judgment and thought content normal.  Nursing note and vitals reviewed.  Results for orders placed or performed in visit on 07/12/14  POCT UA - Microscopic Only  Result Value Ref Range   WBC, Ur, HPF, POC neg    RBC, urine, microscopic neg    Bacteria, U Microscopic neg    Mucus, UA neg    Epithelial cells, urine per micros neg     Crystals, Ur, HPF, POC neg    Casts, Ur, LPF, POC neg    Yeast, UA neg   POCT urinalysis dipstick  Result Value Ref Range   Color, UA yellow    Clarity, UA clear    Glucose, UA neg    Bilirubin, UA neg    Ketones, UA neg    Spec Grav, UA 1.020    Blood, UA neg    pH, UA 5.0    Protein, UA neg    Urobilinogen, UA 0.2    Nitrite, UA neg    Leukocytes, UA Negative    [facility] reading (PRIMARY) by  Dr. Tamala Julian.  LUMBAR SPINE FILMS: SCOLIOSIS; +DIFFUSE DEGENERATIVE CHANGES THROUGHOUT; +LARGE AMOUNT OF STOOL.      Assessment & Plan:   1. Right  sided sciatica   2. Urinary frequency   3. Depression   4. Parkinson's disease     1. R sided sciatica: New.  Rx for Prednisone and Robaxin provided.  Home exercise program provided to perform daily.  Recommend frequent ambulation and stretching.  Call in two weeks if no improvement for referral to ortho. 2.  Urinary frequency:  New.  Benign urine; recent PSA normal.  Moderate stool burden on xray; recommend daily stool softener while taking iron therapy. 3.  Depression: New.  Identified on depression screen; pt denies SI/HI.  Declined referral for psychotherapy or medication.  Advised to RTC for acute worsening. 4.  Parkinson's disease: followed by neurology.   Meds ordered this encounter  Medications  . predniSONE (DELTASONE) 20 MG tablet    Sig: Two tablets daily x 5 days then one tablet daily x 5 days    Dispense:  15 tablet    Refill:  0  . methocarbamol (ROBAXIN) 500 MG tablet    Sig: Take 1-2 tablets (500-1,000 mg total) by mouth at bedtime.    Dispense:  40 tablet    Refill:  0    No Follow-up on file.     Cyan Moultrie Elayne Guerin, M.D. [facility] [address] [place],   [address] [phone] phone [phone] fax

## 2014-07-28 ENCOUNTER — Ambulatory Visit (INDEPENDENT_AMBULATORY_CARE_PROVIDER_SITE_OTHER): Payer: Medicare Other | Admitting: Internal Medicine

## 2014-07-28 VITALS — BP 118/70 | HR 65 | Temp 97.9°F | Resp 17 | Ht 67.5 in | Wt 123.0 lb

## 2014-07-28 DIAGNOSIS — R269 Unspecified abnormalities of gait and mobility: Secondary | ICD-10-CM

## 2014-07-28 DIAGNOSIS — R5383 Other fatigue: Secondary | ICD-10-CM | POA: Diagnosis not present

## 2014-07-28 DIAGNOSIS — G20 Parkinson's disease: Secondary | ICD-10-CM

## 2014-07-28 DIAGNOSIS — R61 Generalized hyperhidrosis: Secondary | ICD-10-CM

## 2014-07-28 DIAGNOSIS — E538 Deficiency of other specified B group vitamins: Secondary | ICD-10-CM

## 2014-07-28 LAB — POCT URINALYSIS DIPSTICK
Bilirubin, UA: NEGATIVE
Blood, UA: NEGATIVE
GLUCOSE UA: NEGATIVE
KETONES UA: NEGATIVE
Leukocytes, UA: NEGATIVE
Nitrite, UA: NEGATIVE
PH UA: 5.5
PROTEIN UA: NEGATIVE
Spec Grav, UA: 1.02
Urobilinogen, UA: 0.2

## 2014-07-28 LAB — POCT CBC
Granulocyte percent: 59.3 %G (ref 37–80)
HEMATOCRIT: 41.7 % — AB (ref 43.5–53.7)
HEMOGLOBIN: 13.8 g/dL — AB (ref 14.1–18.1)
LYMPH, POC: 1.6 (ref 0.6–3.4)
MCH: 29.6 pg (ref 27–31.2)
MCHC: 33.1 g/dL (ref 31.8–35.4)
MCV: 89.7 fL (ref 80–97)
MID (cbc): 0.4 (ref 0–0.9)
MPV: 6.8 fL (ref 0–99.8)
POC Granulocyte: 2.8 (ref 2–6.9)
POC LYMPH %: 32.4 % (ref 10–50)
POC MID %: 8.3 %M (ref 0–12)
Platelet Count, POC: 259 10*3/uL (ref 142–424)
RBC: 4.64 M/uL — AB (ref 4.69–6.13)
RDW, POC: 14.3 %
WBC: 4.8 10*3/uL (ref 4.6–10.2)

## 2014-07-28 LAB — POCT UA - MICROSCOPIC ONLY
Bacteria, U Microscopic: NEGATIVE
CRYSTALS, UR, HPF, POC: NEGATIVE
Casts, Ur, LPF, POC: NEGATIVE
Mucus, UA: NEGATIVE
WBC, Ur, HPF, POC: NEGATIVE
YEAST UA: NEGATIVE

## 2014-07-28 LAB — GLUCOSE, POCT (MANUAL RESULT ENTRY): POC Glucose: 74 mg/dl (ref 70–99)

## 2014-07-28 LAB — POCT SEDIMENTATION RATE: POCT SED RATE: 14 mm/hr (ref 0–22)

## 2014-07-28 MED ORDER — DOXYCYCLINE HYCLATE 100 MG PO TABS: 100.0000 mg | ORAL_TABLET | Freq: Two times a day (BID) | ORAL | Status: DC

## 2014-07-28 MED ORDER — CYANOCOBALAMIN 1000 MCG/ML IJ SOLN
1000.0000 ug | INTRAMUSCULAR | Status: DC
  Administered 2014-07-28: 1000 ug via INTRAMUSCULAR

## 2014-07-28 NOTE — Patient Instructions (Addendum)
Fatigue Fatigue is a feeling of tiredness, lack of energy, lack of motivation, or feeling tired all the time. Having enough rest, good nutrition, and reducing stress will normally reduce fatigue. Consult your caregiver if it persists. The nature of your fatigue will help your caregiver to find out its cause. The treatment is based on the cause.  CAUSES  There are many causes for fatigue. Most of the time, fatigue can be traced to one or more of your habits or routines. Most causes fit into one or more of three general areas. They are: Lifestyle problems  Sleep disturbances.  Overwork.  Physical exertion.  Unhealthy habits.  Poor eating habits or eating disorders.  Alcohol and/or drug use .  Lack of proper nutrition (malnutrition). Psychological problems  Stress and/or anxiety problems.  Depression.  Grief.  Boredom. Medical Problems or Conditions  Anemia.  Pregnancy.  Thyroid gland problems.  Recovery from major surgery.  Continuous pain.  Emphysema or asthma that is not well controlled  Allergic conditions.  Diabetes.  Infections (such as mononucleosis).  Obesity.  Sleep disorders, such as sleep apnea.  Heart failure or other heart-related problems.  Cancer.  Kidney disease.  Liver disease.  Effects of certain medicines such as antihistamines, cough and cold remedies, prescription pain medicines, heart and blood pressure medicines, drugs used for treatment of cancer, and some antidepressants. SYMPTOMS  The symptoms of fatigue include:   Lack of energy.  Lack of drive (motivation).  Drowsiness.  Feeling of indifference to the surroundings. DIAGNOSIS  The details of how you feel help guide your caregiver in finding out what is causing the fatigue. You will be asked about your present and past health condition. It is important to review all medicines that you take, including prescription and non-prescription items. A thorough exam will be done.  You will be questioned about your feelings, habits, and normal lifestyle. Your caregiver may suggest blood tests, urine tests, or other tests to look for common medical causes of fatigue.  TREATMENT  Fatigue is treated by correcting the underlying cause. For example, if you have continuous pain or depression, treating these causes will improve how you feel. Similarly, adjusting the dose of certain medicines will help in reducing fatigue.  HOME CARE INSTRUCTIONS   Try to get the required amount of good sleep every night.  Eat a healthy and nutritious diet, and drink enough water throughout the day.  Practice ways of relaxing (including yoga or meditation).  Exercise regularly.  Make plans to change situations that cause stress. Act on those plans so that stresses decrease over time. Keep your work and personal routine reasonable.  Avoid street drugs and minimize use of alcohol.  Start taking a daily multivitamin after consulting your caregiver. SEEK MEDICAL CARE IF:   You have persistent tiredness, which cannot be accounted for.  You have fever.  You have unintentional weight loss.  You have headaches.  You have disturbed sleep throughout the night.  You are feeling sad.  You have constipation.  You have dry skin.  You have gained weight.  You are taking any new or different medicines that you suspect are causing fatigue.  You are unable to sleep at night.  You develop any unusual swelling of your legs or other parts of your body. SEEK IMMEDIATE MEDICAL CARE IF:   You are feeling confused.  Your vision is blurred.  You feel faint or pass out.  You develop severe headache.  You develop severe abdominal, pelvic, or   back pain.  You develop chest pain, shortness of breath, or an irregular or fast heartbeat.  You are unable to pass a normal amount of urine.  You develop abnormal bleeding such as bleeding from the rectum or you vomit blood.  You have thoughts  about harming yourself or committing suicide.  You are worried that you might harm someone else. MAKE SURE YOU:   Understand these instructions.  Will watch your condition.  Will get help right away if you are not doing well or get worse. Document Released: 12/04/2006 Document Revised: 05/01/2011 Document Reviewed: 06/10/2013 [facility] Patient Information 2015 [place], [place]. This information is not intended to replace advice given to you by your health care provider. Make sure you discuss any questions you have with your health care provider. Tick Bite Information Ticks are insects that attach themselves to the skin and draw blood for food. There are various types of ticks. Common types include wood ticks and deer ticks. Most ticks live in shrubs and grassy areas. Ticks can climb onto your body when you make contact with leaves or grass where the tick is waiting. The most common places on the body for ticks to attach themselves are the scalp, neck, armpits, waist, and groin. Most tick bites are harmless, but sometimes ticks carry germs that cause diseases. These germs can be spread to a person during the tick's feeding process. The chance of a disease spreading through a tick bite depends on:   The type of tick.  Time of year.   How long the tick is attached.   Geographic location.  HOW CAN YOU PREVENT TICK BITES? Take these steps to help prevent tick bites when you are outdoors:  Wear protective clothing. Long sleeves and long pants are best.   Wear white clothes so you can see ticks more easily.  Tuck your pant legs into your socks.   If walking on a trail, stay in the middle of the trail to avoid brushing against bushes.  Avoid walking through areas with long grass.  Put insect repellent on all exposed skin and along boot tops, pant legs, and sleeve cuffs.   Check clothing, hair, and skin repeatedly and before going inside.   Brush off any ticks that are not  attached.  Take a shower or bath as soon as possible after being outdoors.  WHAT IS THE PROPER WAY TO REMOVE A TICK? Ticks should be removed as soon as possible to help prevent diseases caused by tick bites.  If latex gloves are available, put them on before trying to remove a tick.   Using fine-point tweezers, grasp the tick as close to the skin as possible. You may also use curved forceps or a tick removal tool. Grasp the tick as close to its head as possible. Avoid grasping the tick on its body.  Pull gently with steady upward pressure until the tick lets go. Do not twist the tick or jerk it suddenly. This may break off the tick's head or mouth parts.  Do not squeeze or crush the tick's body. This could force disease-carrying fluids from the tick into your body.   After the tick is removed, wash the bite area and your hands with soap and water or other disinfectant such as alcohol.  Apply a small amount of antiseptic cream or ointment to the bite site.   Wash and disinfect any instruments that were used.  Do not try to remove a tick by applying a hot match, petroleum  jelly, or fingernail polish to the tick. These methods do not work and may increase the chances of disease being spread from the tick bite.  WHEN SHOULD YOU SEEK MEDICAL CARE? Contact your health care provider if you are unable to remove a tick from your skin or if a part of the tick breaks off and is stuck in the skin.  After a tick bite, you need to be aware of signs and symptoms that could be related to diseases spread by ticks. Contact your health care provider if you develop any of the following in the days or weeks after the tick bite:  Unexplained fever.  Rash. A circular rash that appears days or weeks after the tick bite may indicate the possibility of Lyme disease. The rash may resemble a target with a bull's-eye and may occur at a different part of your body than the tick bite.  Redness and swelling in the  area of the tick bite.   Tender, swollen lymph glands.   Diarrhea.   Weight loss.   Cough.   Fatigue.   Muscle, joint, or bone pain.   Abdominal pain.   Headache.   Lethargy or a change in your level of consciousness.  Difficulty walking or moving your legs.   Numbness in the legs.   Paralysis.  Shortness of breath.   Confusion.   Repeated vomiting.  Document Released: 02/04/2000 Document Revised: 11/27/2012 Document Reviewed: 07/17/2012 [facility] Patient Information 2015 [place], [place]. This information is not intended to replace advice given to you by your health care provider. Make sure you discuss any questions you have with your health care provider. Dehydration, Adult Dehydration is when you lose more fluids from the body than you take in. Vital organs like the kidneys, brain, and heart cannot function without a proper amount of fluids and salt. Any loss of fluids from the body can cause dehydration.  CAUSES   Vomiting.  Diarrhea.  Excessive sweating.  Excessive urine output.  Fever. SYMPTOMS  Mild dehydration  Thirst.  Dry lips.  Slightly dry mouth. Moderate dehydration  Very dry mouth.  Sunken eyes.  Skin does not bounce back quickly when lightly pinched and released.  Dark urine and decreased urine production.  Decreased tear production.  Headache. Severe dehydration  Very dry mouth.  Extreme thirst.  Rapid, weak pulse (more than 100 beats per minute at rest).  Cold hands and feet.  Not able to sweat in spite of heat and temperature.  Rapid breathing.  Blue lips.  Confusion and lethargy.  Difficulty being awakened.  Minimal urine production.  No tears. DIAGNOSIS  Your caregiver will diagnose dehydration based on your symptoms and your exam. Blood and urine tests will help confirm the diagnosis. The diagnostic evaluation should also identify the cause of dehydration. TREATMENT  Treatment of mild or  moderate dehydration can often be done at home by increasing the amount of fluids that you drink. It is best to drink small amounts of fluid more often. Drinking too much at one time can make vomiting worse. Refer to the home care instructions below. Severe dehydration needs to be treated at the hospital where you will probably be given intravenous (IV) fluids that contain water and electrolytes. HOME CARE INSTRUCTIONS   Ask your caregiver about specific rehydration instructions.  Drink enough fluids to keep your urine clear or pale yellow.  Drink small amounts frequently if you have nausea and vomiting.  Eat as you normally do.  Avoid:  Foods  or drinks high in sugar.  Carbonated drinks.  Juice.  Extremely hot or cold fluids.  Drinks with caffeine.  Fatty, greasy foods.  Alcohol.  Tobacco.  Overeating.  Gelatin desserts.  Wash your hands well to avoid spreading bacteria and viruses.  Only take over-the-counter or prescription medicines for pain, discomfort, or fever as directed by your caregiver.  Ask your caregiver if you should continue all prescribed and over-the-counter medicines.  Keep all follow-up appointments with your caregiver. SEEK MEDICAL CARE IF:  You have abdominal pain and it increases or stays in one area (localizes).  You have a rash, stiff neck, or severe headache.  You are irritable, sleepy, or difficult to awaken.  You are weak, dizzy, or extremely thirsty. SEEK IMMEDIATE MEDICAL CARE IF:   You are unable to keep fluids down or you get worse despite treatment.  You have frequent episodes of vomiting or diarrhea.  You have blood or green matter (bile) in your vomit.  You have blood in your stool or your stool looks black and tarry.  You have not urinated in 6 to 8 hours, or you have only urinated a small amount of very dark urine.  You have a fever.  You faint. MAKE SURE YOU:   Understand these instructions.  Will watch your  condition.  Will get help right away if you are not doing well or get worse. Document Released: 02/06/2005 Document Revised: 05/01/2011 Document Reviewed: 09/26/2010 [facility] Patient Information 2015 [place], [place]. This information is not intended to replace advice given to you by your health care provider. Make sure you discuss any questions you have with your health care provider.

## 2014-07-28 NOTE — Progress Notes (Signed)
Subjective:    Patient ID: [name], male    DOB: January 29, 1946, 69 y.o.   MRN: [id number]  HPI I, Travis Ser R.T. (R), am scribing for Dr. Lou Foster M.D.   [name] comes in today with fatigue. He has had night sweats as well for the past 2 weeks. He was seen for lower back and right shoulder pain. He has a Hx of Parkinson's disease. He complains that his right shoulder hangs a bit lower that his left. He states he has frequent urination. He denies fever, chills, nausea, vomiting, chest pain, SOB, and burning with urination. He  denies any issue with his B12. He has his last physical exam with Dr. Everlene Foster. Appetitie is good, he shows 3 lb weight loss. No GI issues.No tick exposure.   Review of Systems  Constitutional: Positive for diaphoresis, activity change and fatigue. Negative for fever, chills, appetite change and unexpected weight change.  HENT: Negative.   Eyes: Negative.   Respiratory: Positive for choking.   Cardiovascular: Negative.   Gastrointestinal: Negative.   Genitourinary: Positive for frequency. Negative for dysuria.  Musculoskeletal: Positive for back pain, arthralgias and gait problem.  Skin: Negative.   Neurological: Positive for tremors.  Psychiatric/Behavioral: Positive for dysphoric mood.       Objective:   Physical Exam  Constitutional: He is oriented to person, place, and time. He appears well-nourished. He appears distressed.  HENT:  Head: Normocephalic.  Mouth/Throat: Oropharynx is clear and moist.  Eyes: Conjunctivae and EOM are normal. Pupils are equal, round, and reactive to light.  Neck: Normal range of motion. Neck supple. No thyromegaly present.  Cardiovascular: Normal rate, regular rhythm, normal heart sounds and intact distal pulses.   Pulmonary/Chest: Effort normal and breath sounds normal.  Abdominal: Soft. Bowel sounds are normal.  Musculoskeletal: He exhibits tenderness.  Neurological: He is alert and oriented to person, place,  and time. He exhibits normal muscle tone. Coordination normal.  Psychiatric: Judgment and thought content normal. His speech is delayed. He is slowed. Cognition and memory are normal. He exhibits a depressed mood.   Results for orders placed or performed in visit on 07/28/14  POCT CBC  Result Value Ref Range   WBC 4.8 4.6 - 10.2 K/uL   Lymph, poc 1.6 0.6 - 3.4   POC LYMPH PERCENT 32.4 10 - 50 %L   MID (cbc) 0.4 0 - 0.9   POC MID % 8.3 0 - 12 %M   POC Granulocyte 2.8 2 - 6.9   Granulocyte percent 59.3 37 - 80 %G   RBC 4.64 (A) 4.69 - 6.13 M/uL   Hemoglobin 13.8 (A) 14.1 - 18.1 g/dL   HCT, POC 41.7 (A) 43.5 - 53.7 %   MCV 89.7 80 - 97 fL   MCH, POC 29.6 27 - 31.2 pg   MCHC 33.1 31.8 - 35.4 g/dL   RDW, POC 14.3 %   Platelet Count, POC 259 142 - 424 K/uL   MPV 6.8 0 - 99.8 fL  POCT glucose (manual entry)  Result Value Ref Range   POC Glucose 74 70 - 99 mg/dl  POCT SEDIMENTATION RATE  Result Value Ref Range   POCT SED RATE  0 - 22 mm/hr  POCT UA - Microscopic Only  Result Value Ref Range   WBC, Ur, HPF, POC neg    RBC, urine, microscopic 0-1    Bacteria, U Microscopic neg    Mucus, UA neg    Epithelial cells, urine per micros  0-1    Crystals, Ur, HPF, POC neg    Casts, Ur, LPF, POC neg    Yeast, UA neg   POCT urinalysis dipstick  Result Value Ref Range   Color, UA yellow    Clarity, UA clear    Glucose, UA neg    Bilirubin, UA neg    Ketones, UA neg    Spec Grav, UA 1.020    Blood, UA neg    pH, UA 5.5    Protein, UA neg    Urobilinogen, UA 0.2    Nitrite, UA neg    Leukocytes, UA Negative           Assessment & Plan:  Progressive parkinsons Progressive generalized weakness/fatigue Possible B12 def/1052mcg im today Possible tick disease/Doxycycline bid/Hydration RTC if not better 1 week

## 2014-07-29 ENCOUNTER — Encounter: Payer: Self-pay | Admitting: Family Medicine

## 2014-07-29 LAB — BASIC METABOLIC PANEL
BUN: 15 mg/dL (ref 6–23)
CO2: 26 mEq/L (ref 19–32)
Calcium: 9.3 mg/dL (ref 8.4–10.5)
Chloride: 103 mEq/L (ref 96–112)
Creat: 0.81 mg/dL (ref 0.50–1.35)
Glucose, Bld: 74 mg/dL (ref 70–99)
POTASSIUM: 5 meq/L (ref 3.5–5.3)
Sodium: 142 mEq/L (ref 135–145)

## 2014-07-29 LAB — FOLATE: Folate: >20 ng/mL

## 2014-07-29 LAB — TSH: TSH: 1.469 u[IU]/mL (ref 0.350–4.500)

## 2014-07-29 LAB — VITAMIN B12: Vitamin B-12: 428 pg/mL (ref 211–911)

## 2014-08-14 ENCOUNTER — Encounter: Payer: Self-pay | Admitting: Neurology

## 2014-08-14 ENCOUNTER — Ambulatory Visit (INDEPENDENT_AMBULATORY_CARE_PROVIDER_SITE_OTHER): Payer: Medicare Other | Admitting: Neurology

## 2014-08-14 VITALS — BP 122/78 | HR 68 | Resp 14 | Ht 67.5 in | Wt 125.0 lb

## 2014-08-14 DIAGNOSIS — G4700 Insomnia, unspecified: Secondary | ICD-10-CM | POA: Diagnosis not present

## 2014-08-14 DIAGNOSIS — M545 Low back pain, unspecified: Secondary | ICD-10-CM

## 2014-08-14 DIAGNOSIS — G20 Parkinson's disease: Secondary | ICD-10-CM | POA: Diagnosis not present

## 2014-08-14 MED ORDER — GABAPENTIN 100 MG PO CAPS: 100.0000 mg | ORAL_CAPSULE | Freq: Every day | ORAL | Status: DC

## 2014-08-14 MED ORDER — CARBIDOPA-LEVODOPA 25-100 MG PO TABS: 1.0000 | ORAL_TABLET | Freq: Four times a day (QID) | ORAL | Status: DC

## 2014-08-14 MED ORDER — RASAGILINE MESYLATE 1 MG PO TABS: 1.0000 mg | ORAL_TABLET | Freq: Every day | ORAL | Status: DC

## 2014-08-14 MED ORDER — ROPINIROLE HCL 2 MG PO TABS: ORAL_TABLET | ORAL | Status: DC

## 2014-08-14 NOTE — Progress Notes (Signed)
Subjective:    Patient ID: [name] is a 69 y.o. male.  HPI     Interim history:   [name] is a very pleasant 69 year old right-handed gentleman with an underlying medical history of hypothyroidism, OSA (not using CPAP currently, but tried in the past), RLS, MCI, CTS, depression, anemia, and allergies, who presents for followup consultation of his right-sided predominant, akinetic-rigid type Parkinson's disease, complicated by sleep disorder, RLS, memory loss, prior hallucinations, prior fall with injury, and syncope. He is unaccompanied today. I last saw him on 04/15/2014, at which time he reported feeling fine. He had no lightheadedness. He had not been using his compression stockings and in fact had not bought them yet. He did admit that he was not drinking enough water, estimating about 1 glass per day. He said he would walk 6 miles a day him a typically with his wife. I suggested he drink more water. He was advised to start using compression stockings every day, not at night. He was advised to monitor his blood pressure. For sleep I suggested he try melatonin. I suggested a one-month follow-up with our nurse practitioner. I kept his medications the same. In the interim, he was seen by Travis Givens, NP on 05/14/2014. He was felt to be stable. He had no new complaints. He was advised to continue to stay well-hydrated and use compression stockings. He was advised to continue taking Azilect, Sinemet and Requip.  Today, 08/14/2014: He reports fatigue over the past 2 months. He has more stiffness in the afternoon, after noon or 1 PM. He saw his primary care physician on 07/28/2014 and complained of fatigue. I reviewed the office note. He received a B12 injection, 1000 g IM. His B12 was 428 at the time. TSH was normal. He has trouble staying asleep, and melatonin did not help. On the positive side, he is drinking more (flavored) water and has had no lightheadedness or fainting spells, no  falls. He has had some LBP. He had L spine X Ray on 07/12/14: Lumbar spine scoliosis with associated degenerative changes. His back pain is usually midline nonradiating.  Previously:  I saw him on 05/12/13, at which time he reported lightheadedness and his BP was in fact low. He denies any recent syncopal spells and no falls. He stopped Toviaz some 4 months prior (due to cost). He denied recent illness and recent medication changes. He had some word finding difficulty. He was driving and had not had recent issues driving. He had no constipation or hallucinations and was sleeping well.  In the interim, he was seen by our nurse practitioner, [name] on 11/13/2013, at which time he was stable, his blood pressure was stable, and he was kept on the same medications including Azilect, Sinemet and Requip.  In the interim, he was hospitalized from 03/30/2014 through 04/01/2014 for her syncopal spell. He had a syncopal event at home on 03/30/2014 and found himself down after having a warning sign of dizziness, and sweating around 6:54 PM. He went to see his primary care physician and was advised to come in to the emergency room. He had another presyncopal spell the same day. I reviewed the hospital records including the discharge summary. He had a echocardiogram on 03/31/2014 which showed mild tricuspid regurgitation, EF of 65-70%, no regional wall motion abnormalities, mild aortic regurgitation, mild mitral regurgitation, and mild concentric left ventricular hypertrophy. Carotid Doppler studies from 04/01/2014 showed: 1-39% internal carotid artery stenosis bilaterally. There was antegrade vertebral artery flow.  He had a head CT without contrast on 03/30/2014: 1. Negative intracranial imaging. 2. Remote subcondylar left mandible fracture with chronic TMJ dislocation. In addition, I personally reviewed the images through the PACS system. He was discharged home. He was seen by his cardiologist on 04/09/2014, and  did not have any orthostatic blood pressure drops. He had had systolic blood pressures in the 160s in the hospital. His blood pressure at the time of his office visit with [name] was 132/87. [name] felt that he would not be a good candidate for midodrine in the context of no significant orthostatic blood pressure drop and normal blood pressure values at baseline. He emailed me regarding a trial of Droxidopa. The patient was advised to start using compression stockings which were previously recommended to him but he did not follow through with that.  I saw him on 11/12/2012, at which time he presented after a recent syncopal spell. I felt at the time that the syncope was secondary to a combination of things including autonomic dysregulation in the context of Parkinson's disease, exposure to heat for prolonged period of time with dehydration and quick changes in position as well as potential medication side effects. He had a head CT which was negative. He also had recent blood work. I did not change his medications at the time but talk to him about supportive measures including changing positions slowly, avoid prolonged heat exposure, staying well hydrated and using a stool for working in the yard and taking frequent breaks when working outside.    He had an event of LOC on 10/30/12. He reported that he had not been feeling well for the past 2 weeks prior to his syncope. He had been feeling dizzy especially with change of position with intermittent nausea reported. He had been in his yard working for several hours and became dizzy and had a blackout spell. He fell into the bushes, and came to after some seconds. He was bending up and down at lot. After he regained consciousness, he was feeling weak and eventually was able to go to the porch, where he sat for about 45 minutes. His wife was at work. He had some slight confusion and lack of energy, but denied any focal weakness/CP/palpitations/SOB. He did not  have any prior syncopal spells. He had [facility] the [facility] and saw [name] on 10/31/2012. His BUN was borderline at 24, he had a CTH on 10/31/12, which was negative and yesterday went for cardiological consultation with [name], who felt, that he had a vasovagal event in the context of dysautonomia with PD.   I first met him on 06/05/12, at which time I felt his Parkinson's disease was stable and I did not make any medication changes.   He previously followed with [name] and was last seen by him on 02/05/2012, at which time he was complaining of right hip pain. No medication changes were done at the time. He has been on Synthroid, Sinemet 25/100 mg strength one tablet 3 times a day, Requip 2 mg strength half a pill at 6:30, half a pill at noon, half a pill at 5 PM and one and a half a pill at 9 PM. He is on rasagiline 1 mg once daily, baby aspirin, multivitamin, Toviaz.   He received a steroid injection into the R hip in 8/14, which helped the pain. He worked for [employer]. He lives with his wife in a one storey [place]. They have 2 grown children, son and daughter, both local. Two MAs and 4 cousins had Huntington's and he has been tested and was negative for it. [name] arranged the genetic testing. He has occasional depressive Sx, but better when he can get out and walk, he walks about 6 miles/day.   He was diagnosed with Parkinson's disease in July 2009, with symptoms going back a year prior and consisted of trouble with speech and eating. He was initially started on Requip, Azilect and and Sinemet. He had side effects from the Requip including excessive spending, daytime sleepiness and hypersexuality. His Requip dose was therefore decreased. He exercises regularly. He is independent in his ADLs. He has had visual hallucinations. He fell in October 2011 fracturing his left distal radius, requiring surgery in December 2011. He had right-sided leg cramps improved with  Flexeril, but stopped the medication. He denied leg edema, or compulsive behavior. He has bladder incontinence. In December 2013 his MMSE was 29, clock drawing was poor and animal fluency was 17, geriatric depression scale was 8/15.     His Past Medical History Is Significant For: Past Medical History  Diagnosis Date  . Thyroid disease   . Allergy   . Depression   . Parkinson's disease   . Anemia   . Parkinson's disease 06/05/2012  . Raynaud's syndrome     His Past Surgical History Is Significant For: Past Surgical History  Procedure Laterality Date  . Mandible fracture surgery    . Hernia repair    . Wrist fracture surgery    . Rotator cuff repair    . Tonsillectomy    . Fracture surgery      jaw  . Vasectomy    . Appendectomy      His Family History Is Significant For: Family History  Problem Relation Age of Onset  . Cancer Father     colon    His Social History Is Significant For: History   Social History  . Marital Status: Married    Spouse Name: [name]  . Number of Children: 2  . Years of Education: 12+   Occupational History  . DISABILITY     due to Parkinson's   Social History Main Topics  . Smoking status: Never Smoker   . Smokeless tobacco: Never Used  . Alcohol Use: 0.0 - 4.2 oz/week    0-7 Cans of beer per week     Comment: occas.  . Drug Use: No  . Sexual Activity: No   Other Topics Concern  . None   Social History Narrative   Patient is married [name])  with 2 children.   Patient is right handed.   Patient has high school education.   Patient drinks 1-2 cups daily.    His Allergies Are:  Allergies  Allergen Reactions  . Ibuprofen Other (See Comments)    Blood in stool.  . Lamictal [Lamotrigine] Other (See Comments)    Blisters in mouth  . Nsaids   . Penicillins   :   His Current Medications Are:  Outpatient Encounter Prescriptions as of 08/14/2014  Medication Sig  . aspirin 81 MG tablet Take 160 mg by mouth daily.  . Biotin  (BIOTIN 5000) 5 MG CAPS Take 1 capsule by mouth daily.  . carbidopa-levodopa (SINEMET IR) 25-100 MG per tablet Take 1 tablet by mouth 4 (four) times daily.  Marland Kitchen levothyroxine (SYNTHROID, LEVOTHROID) 50 MCG tablet Take 1 tablet (50 mcg total) by mouth every other day.  Marland Kitchen  levothyroxine (SYNTHROID, LEVOTHROID) 75 MCG tablet Take 1 tablet (75 mcg total) by mouth every other day.  . Melatonin 5 MG TABS Take by mouth.  . Multiple Vitamins-Minerals (MULTIVITAMIN WITH MINERALS) tablet Take 1 tablet by mouth daily.  . rasagiline (AZILECT) 1 MG TABS tablet Take 1 tablet (1 mg total) by mouth daily.  Marland Kitchen rOPINIRole (REQUIP) 2 MG tablet TAKE 1/2 TABLET BY MOUTH AT 6:30AM, 1/2 TABLET BY MOUTH AT NOON, 1/2 TABLET BY MOUTH AT 5PM, AND 1 1/2 TABLETS AT 9PM  . [DISCONTINUED] carbidopa-levodopa (SINEMET IR) 25-100 MG per tablet Take 1 tablet by mouth 3 (three) times daily.  . [DISCONTINUED] rasagiline (AZILECT) 1 MG TABS tablet Take 1 tablet (1 mg total) by mouth daily.  . [DISCONTINUED] rOPINIRole (REQUIP) 2 MG tablet TAKE 1/2 TABLET BY MOUTH AT 6:30AM, 1/2 TABLET BY MOUTH AT NOON, 1/2 TABLET BY MOUTH AT 5PM, AND 1 1/2 TABLETS AT 9PM  . gabapentin (NEURONTIN) 100 MG capsule Take 1 capsule (100 mg total) by mouth at bedtime.  . [DISCONTINUED] doxycycline (VIBRA-TABS) 100 MG tablet Take 1 tablet (100 mg total) by mouth 2 (two) times daily.  . [DISCONTINUED] methocarbamol (ROBAXIN) 500 MG tablet Take 1-2 tablets (500-1,000 mg total) by mouth at bedtime. (Patient not taking: Reported on 07/28/2014)  . [DISCONTINUED] predniSONE (DELTASONE) 20 MG tablet Two tablets daily x 5 days then one tablet daily x 5 days   Facility-Administered Encounter Medications as of 08/14/2014  Medication  . cyanocobalamin ((VITAMIN B-12)) injection 1,000 mcg  :  Review of Systems:  Out of a complete 14 point review of systems, all are reviewed and negative with the exception of these symptoms as listed below:   Review of Systems   Neurological:       Sometimes has trouble sleeping at night   Psychiatric/Behavioral:       Decreased energy     Objective:  Neurologic Exam  Physical Exam Physical Examination:   Filed Vitals:   08/14/14 1059  BP: 122/78  Foster: 68  Resp: 14   He has no lightheadedness upon standing.  General Examination: The patient is a very pleasant 69 y.o. male in no acute distress.  HEENT: Normocephalic, atraumatic, pupils are equal, round and reactive to light and accommodation. Funduscopy is normal. Extraocular tracking shows mild saccadic breakdown without nystagmus noted. There is no limitation to his gaze. There is mild decrease in eye blink rate. Hearing is intact. Face is symmetric with moderate facial masking and normal facial sensation. There is no lip, neck or jaw tremor. Neck is moderately rigid with intact passive ROM. There are no carotid bruits on auscultation. Oropharynx exam reveals mild mouth dryness. No significant airway crowding is noted. Mallampati is class II. Tongue protrudes centrally and palate elevates symmetrically. He has minimal drooling.   Chest: is clear to auscultation without wheezing, rhonchi or crackles noted.  Heart: sounds are regular and normal without murmurs, rubs or gallops noted.   Abdomen: is soft, non-tender and non-distended with normal bowel sounds appreciated on auscultation.  Extremities: There is no pitting edema in the distal lower extremities bilaterally. Pedal pulses are intact.  Skin: is warm and dry with no trophic changes noted.  Musculoskeletal: exam reveals no obvious joint deformities, tenderness or joint swelling or erythema.  Neurologically:  Mental status: The patient is awake and alert, paying good attention. He is able to completely provide the history. He is oriented to: person, place, time/date, situation, day of week, month of year and year.  His memory, attention, language and knowledge are intact. There is no aphasia,  agnosia, apraxia or anomia. There is a mild degree of bradyphrenia. Speech is moderately hypophonic with minimal dysarthria noted. Mood is congruent and affect is normal.  Cranial nerves are as described above under HEENT exam. In addition, shoulder shrug is normal with equal shoulder height noted.  Motor exam: Normal bulk, and strength for age is noted. Tone is mildly rigid with presence of cogwheeling in the right upper extremity. There is overall moderate bradykinesia. There is no drift or rebound. There is no tremor.  Romberg is negative. Reflexes are 2+ in the upper extremities and 2+ in the lower extremities. Fine motor skills exam reveals: Finger taps are moderately impaired on the right and mildly impaired on the left. Hand movements are moderately impaired on the right and mildly impaired on the left. RAP (rapid alternating patting) is moderately impaired on the right and moderately impaired on the left. Foot taps are moderately impaired on the right and mild to moderately impaired on the left. Foot agility (in the form of heel stomping) is moderately impaired on the right and mildly impaired on the left.    Cerebellar testing shows no dysmetria or intention tremor on finger to nose testing. There is no truncal or gait ataxia. Heel to shin is unremarkable b/l.   Sensory exam is intact to light touch in the UEs and LEs bilaterally.  Gait, station and balance: He stands up from the seated position with no significance difficulty and needs no assistance. He does no feel lightheaded. No veering to one side is noted. He is not noted to lean to the side. Posture is mildly stooped. No lightheadedness reported. Stance is narrow-based. He walks with decrease in stride length and pace and decreased arm swing on the right and mild dystonic posturing with the right arm and hand, unchanged. He turns in 3 stepsand there is slight insecurity with turn. Tandem walk is not possible. Balance is mildly impaired.    Assessment and Plan:   In summary, [name] is a very pleasant 69 year old male with a history of right-sided predominant Parkinson's disease of the akinetic-rigid type, complicated by a syncopal spell in September 2014 and again in  February 2016, for which he had full workup and has also seen cardiology. He may very well have Parkinson's related autonomic dysregulation which often results in supine hypertension and sudden drops in blood pressure and falls and often some injuries. He has had fairly normal values of his blood pressure at baseline but in March 2015 he did present with low pressure values. he has increased his water intake. He has not had any recent issues with lightheadedness or fainting since February 2016. Motor-wise he has progressed a little more. He does report stiffness and also low back pain and difficulty maintaining sleep. Melatonin was not helpful he felt. Today, I suggested he increase Sinemet to 4 pills a day, he can take this at the same time as the ropinirole which is currently a total of 3 pills daily divided in 4 doses. we will continue with Azilect once daily. For his back pain and to help him sleep at night I would like to try him on low-dose gabapentin. He will take 100 mg each night and we have room to increase if needed. I talked to him about potential side effects with a new medication and provided him with written instructions as to the medication changes today. I also reminded  him that the increase in levodopa may cause lightheadedness or dizziness. He is furthermore encouraged to continue with good nutrition and good hydration and consider using his compression socks. I would like to see him back in 4 months, sooner if needed. I answered all his questions today and he was in agreement.  I spent 25 min in total face-to-face time with the patient, more 50% of which was spent in counseling and coordination of care, reviewing test results, reviewing medication and  reviewing the diagnosis of PD, insomnia, back pain, the prognosis and treatment options.

## 2014-08-14 NOTE — Patient Instructions (Signed)
We will increase your Sinemet to 4 times a day. Take at the same time as the ropinirole.  For your back pain and sleep issues, let's try low dose Neurontin (gabapentin) 100 mg strength: Take 1 pill nightly at bedtime. The most common side effects reported are sedation or sleepiness. Rare side effects include balance problems, confusion. We can go up on the dose, if needed.

## 2014-08-17 ENCOUNTER — Other Ambulatory Visit: Payer: Self-pay

## 2014-08-17 ENCOUNTER — Ambulatory Visit: Payer: Medicare Other | Admitting: Neurology

## 2014-08-31 ENCOUNTER — Other Ambulatory Visit: Payer: Self-pay | Admitting: Family Medicine

## 2014-08-31 DIAGNOSIS — Z800 Family history of malignant neoplasm of digestive organs: Secondary | ICD-10-CM | POA: Diagnosis not present

## 2014-08-31 DIAGNOSIS — Z1211 Encounter for screening for malignant neoplasm of colon: Secondary | ICD-10-CM | POA: Diagnosis not present

## 2014-10-15 ENCOUNTER — Ambulatory Visit: Payer: Medicare Other | Admitting: Emergency Medicine

## 2014-10-21 ENCOUNTER — Ambulatory Visit (INDEPENDENT_AMBULATORY_CARE_PROVIDER_SITE_OTHER): Payer: Medicare Other | Admitting: Emergency Medicine

## 2014-10-21 VITALS — BP 108/68 | HR 61 | Temp 97.8°F | Resp 18 | Ht 67.5 in | Wt 126.8 lb

## 2014-10-21 DIAGNOSIS — R55 Syncope and collapse: Secondary | ICD-10-CM | POA: Diagnosis not present

## 2014-10-21 DIAGNOSIS — G20 Parkinson's disease: Secondary | ICD-10-CM | POA: Diagnosis not present

## 2014-10-21 DIAGNOSIS — Z23 Encounter for immunization: Secondary | ICD-10-CM

## 2014-10-21 NOTE — Progress Notes (Addendum)
Subjective:  This chart was scribed for Arlyss Queen MD, by Tamsen Roers, at [facility].  This patient was seen in room 11 and the patient's care was started at 8:25 AM.   Chief Complaint  Patient presents with  . Follow-up    went to [facility] for syncope 6 month ago     Patient ID: [name], male    DOB: [birth year], 69 y.o.   MRN: [id number]  HPI  HPI Comments: [name] is a 69 y.o. male who presents to the [facility] for a follow up regarding his syncopic episode which occurred six months ago and was admitted to the ED.  Patient denies any faint or weak spells recently.  He saw his physicians who related his episode to having Parkinson's ( was diagnosed in 2009). He denies any chest pain or shortness of breath.  Patient would like a flu shot today.   Patient was put on Gabapentin for his back problems and states that it made it very drowsy so he is no longer taking it.  He states that it did help his back and he now feels much better in regards to his back.       Patient Active Problem List   Diagnosis Date Noted  . Raynaud's phenomenon 04/16/2014  . Insomnia 03/31/2014  . Syncope 11/11/2012  . Autonomic postural hypotension 11/11/2012  . Parkinson's disease 06/05/2012  . Restless leg syndrome 11/15/2011  . Hypothyroid 09/11/2011  . Parkinsonism 04/02/2011  . Depression 04/02/2011   Past Medical History  Diagnosis Date  . Thyroid disease   . Allergy   . Depression   . Parkinson's disease   . Anemia   . Parkinson's disease 06/05/2012  . Raynaud's syndrome    Past Surgical History  Procedure Laterality Date  . Mandible fracture surgery    . Hernia repair    . Wrist fracture surgery    . Rotator cuff repair    . Tonsillectomy    . Fracture surgery      jaw  . Vasectomy    . Appendectomy     Allergies  Allergen Reactions  . Ibuprofen Other (See Comments)    Blood in stool.  . Lamictal [Lamotrigine] Other  (See Comments)    Blisters in mouth  . Nsaids   . Penicillins    Prior to Admission medications   Medication Sig Start Date End Date Taking? Authorizing Provider  aspirin 81 MG tablet Take 160 mg by mouth daily.   Yes Historical Provider, MD  Biotin (BIOTIN 5000) 5 MG CAPS Take 1 capsule by mouth daily.   Yes Historical Provider, MD  carbidopa-levodopa (SINEMET IR) 25-100 MG per tablet Take 1 tablet by mouth 4 (four) times daily. 08/14/14  Yes Star Age, MD  levothyroxine (SYNTHROID, LEVOTHROID) 50 MCG tablet Take 1 tablet (50 mcg total) by mouth every other day. 03/12/14  Yes Wendie Agreste, MD  levothyroxine (SYNTHROID, LEVOTHROID) 75 MCG tablet TAKE 1 TABLET BY MOUTH EVERY DAY 09/02/14  Yes Jaynee Eagles, PA-C  Multiple Vitamins-Minerals (MULTIVITAMIN WITH MINERALS) tablet Take 1 tablet by mouth daily.   Yes Historical Provider, MD  rasagiline (AZILECT) 1 MG TABS tablet Take 1 tablet (1 mg total) by mouth daily. 08/14/14  Yes Star Age, MD  rOPINIRole (REQUIP) 2 MG tablet TAKE 1/2 TABLET BY MOUTH AT 6:30AM, 1/2 TABLET BY MOUTH AT NOON, 1/2 TABLET BY MOUTH AT 5PM, AND 1 1/2 TABLETS AT  9PM 08/14/14  Yes Star Age, MD  Melatonin 5 MG TABS Take by mouth.    Historical Provider, MD   Social History   Social History  . Marital Status: Married    Spouse Name: [name]  . Number of Children: 2  . Years of Education: 12+   Occupational History  . DISABILITY     due to Parkinson's   Social History Main Topics  . Smoking status: Never Smoker   . Smokeless tobacco: Never Used  . Alcohol Use: 0.0 - 4.2 oz/week    0-7 Cans of beer per week     Comment: occas.  . Drug Use: No  . Sexual Activity: No   Other Topics Concern  . Not on file   Social History Narrative   Patient is married [name])  with 2 children.   Patient is right handed.   Patient has high school education.   Patient drinks 1-2 cups daily.     Review of Systems  Constitutional: Negative for fever and chills.  Eyes:  Negative for pain, discharge and itching.  Respiratory: Negative for shortness of breath.   Cardiovascular: Negative for chest pain.  Gastrointestinal: Negative for nausea and vomiting.  Neurological: Negative for weakness.       Objective:   Physical Exam  HEAD: He has parkinsonian facial features. NECK: supple no meningeal signs SPINE/BACK:entire spine nontender CV: S1/S2 noted, no murmurs/rubs/gallops noted LUNGS: Lungs are clear to auscultation bilaterally, no apparent distress ABDOMEN: soft, nontender, no rebound or guarding, bowel sounds noted throughout abdomen SKIN: warm, color normal EXTREMITIES: He has minimal rigidity in his extremities.   He has no leg edema.          Filed Vitals:   10/21/14 0813  BP: 108/68  Pulse: 61  Temp: 97.8 F (36.6 C)  TempSrc: Oral  Resp: 18  Height: 5' 7.5" (1.715 m)  Weight: 126 lb 12.8 oz (57.516 kg)  SpO2: 96%         Assessment & Plan:  Patient is doing very well. He has had no recurrent syncopal episodes. He was given his flu shot today. Recent blood work was done in June. He will return to clinic for Prevnar shot in one month. This will be placed as a future order.I personally performed the services described in this documentation, which was scribed in my presence. The recorded information has been reviewed and is accurate.

## 2014-10-22 ENCOUNTER — Ambulatory Visit: Payer: Medicare Other | Admitting: Emergency Medicine

## 2014-11-09 ENCOUNTER — Other Ambulatory Visit: Payer: Self-pay | Admitting: Urgent Care

## 2014-11-12 ENCOUNTER — Other Ambulatory Visit: Payer: Self-pay | Admitting: Family Medicine

## 2014-11-13 ENCOUNTER — Other Ambulatory Visit: Payer: Self-pay | Admitting: Emergency Medicine

## 2014-11-13 ENCOUNTER — Ambulatory Visit: Payer: Medicare Other

## 2014-11-13 ENCOUNTER — Other Ambulatory Visit: Payer: Self-pay | Admitting: Family Medicine

## 2014-11-13 MED ORDER — LEVOTHYROXINE SODIUM 75 MCG PO TABS: 75.0000 ug | ORAL_TABLET | Freq: Every day | ORAL | Status: DC

## 2014-11-14 ENCOUNTER — Encounter: Payer: Self-pay | Admitting: Emergency Medicine

## 2014-11-16 ENCOUNTER — Encounter: Payer: Self-pay | Admitting: Emergency Medicine

## 2014-11-16 ENCOUNTER — Other Ambulatory Visit: Payer: Self-pay | Admitting: Emergency Medicine

## 2014-11-16 ENCOUNTER — Telehealth: Payer: Self-pay

## 2014-11-16 MED ORDER — LEVOTHYROXINE SODIUM 50 MCG PO TABS: 50.0000 ug | ORAL_TABLET | Freq: Every day | ORAL | Status: DC

## 2014-11-16 NOTE — Telephone Encounter (Signed)
I went ahead and sent in the prescription. this has been done.

## 2014-11-16 NOTE — Telephone Encounter (Signed)
Pt is needing to talk with someone about the rx refill he requested -we authorized the wrong rx   Best number [phone]

## 2014-11-16 NOTE — Telephone Encounter (Signed)
He sent a [employer]. Is this ok to send in? Levothyroxine 50 mcg

## 2014-12-14 ENCOUNTER — Encounter: Payer: Self-pay | Admitting: Neurology

## 2014-12-14 ENCOUNTER — Ambulatory Visit (INDEPENDENT_AMBULATORY_CARE_PROVIDER_SITE_OTHER): Payer: Medicare Other | Admitting: Neurology

## 2014-12-14 VITALS — BP 110/64 | HR 62 | Resp 14 | Ht 67.5 in | Wt 123.0 lb

## 2014-12-14 DIAGNOSIS — G909 Disorder of the autonomic nervous system, unspecified: Secondary | ICD-10-CM

## 2014-12-14 DIAGNOSIS — G20 Parkinson's disease: Secondary | ICD-10-CM

## 2014-12-14 DIAGNOSIS — G4700 Insomnia, unspecified: Secondary | ICD-10-CM

## 2014-12-14 DIAGNOSIS — F329 Major depressive disorder, single episode, unspecified: Secondary | ICD-10-CM | POA: Diagnosis not present

## 2014-12-14 DIAGNOSIS — R55 Syncope and collapse: Secondary | ICD-10-CM

## 2014-12-14 DIAGNOSIS — F32A Depression, unspecified: Secondary | ICD-10-CM

## 2014-12-14 MED ORDER — CITALOPRAM HYDROBROMIDE 10 MG PO TABS: 10.0000 mg | ORAL_TABLET | Freq: Every day | ORAL | Status: DC

## 2014-12-14 MED ORDER — RASAGILINE MESYLATE 1 MG PO TABS: 1.0000 mg | ORAL_TABLET | Freq: Every day | ORAL | Status: DC

## 2014-12-14 MED ORDER — CARBIDOPA-LEVODOPA 25-100 MG PO TABS: 1.0000 | ORAL_TABLET | Freq: Four times a day (QID) | ORAL | Status: DC

## 2014-12-14 NOTE — Progress Notes (Signed)
Subjective:    Patient ID: [name] is a 69 y.o. male.  HPI     Interim history:   [name] is a very pleasant 69 year old right-handed gentleman with an underlying medical history of hypothyroidism, OSA (not using CPAP currently, but tried in the past), RLS, MCI, CTS, depression, anemia, and allergies, who presents for followup consultation of his right-sided predominant, akinetic-rigid type Parkinson's disease, complicated by sleep disorder, RLS, memory loss, prior hallucinations, prior fall with injury, and syncope. He is accompanied by his wife today. I last saw him on 08/14/2014, at which time he reported more fatigue over the past 2 months. He reported having more stiffness in the afternoon. He received a B12 injection recently through his primary care physician. His B12 level was 428 at the time. His TSH was normal. He reported trouble staying asleep and melatonin was not helpful. He was drinking more water and had had no recent lightheadedness or fainting spell and no falls. He had some low back pain. He had a lumbar spine x-ray on 07/12/2014: Lumbar spine scoliosis with associated degenerative changes. I suggested we increase his Sinemet to 4 times a day and keep his ropinirole is same. I suggested low-dose gabapentin at night for his back pain and for sleep.  Today, 12/14/2014: He reports feeling more sedated. His wife notes more word finding difficuties, more forgetful, more fatigued, less physically active. He is more depressed has voiced SI in the past, but not recently and no recent intent. He had a reaction to lamictal in the past. He has abnormal posturing of his right upper extremity while walking and sitting sometimes. He has trouble maintaining sleep. He tried the gabapentin but did not feel good on it so he stopped it. Melatonin has not helped his sleep.   Previously:  I saw him on 04/15/2014, at which time he reported feeling fine. He had no lightheadedness. He had not  been using his compression stockings and in fact had not bought them yet. He did admit that he was not drinking enough water, estimating about 1 glass per day. He said he would walk 6 miles a day him a typically with his wife. I suggested he drink more water. He was advised to start using compression stockings every day, not at night. He was advised to monitor his blood pressure. For sleep I suggested he try melatonin. I suggested a one-month follow-up with our nurse practitioner. I kept his medications the same. In the interim, he was seen by Ward Givens, NP on 05/14/2014. He was felt to be stable. He had no new complaints. He was advised to continue to stay well-hydrated and use compression stockings. He was advised to continue taking Azilect, Sinemet and Requip.   I saw him on 05/12/13, at which time he reported lightheadedness and his BP was in fact low. He denies any recent syncopal spells and no falls. He stopped Toviaz some 4 months prior (due to cost). He denied recent illness and recent medication changes. He had some word finding difficulty. He was driving and had not had recent issues driving. He had no constipation or hallucinations and was sleeping well.  In the interim, he was seen by our nurse practitioner, Ms. Clabe Seal on 11/13/2013, at which time he was stable, his blood pressure was stable, and he was kept on the same medications including Azilect, Sinemet and Requip.  In the interim, he was hospitalized from 03/30/2014 through 04/01/2014 for her syncopal spell. He had a syncopal  event at home on 03/30/2014 and found himself down after having a warning sign of dizziness, and sweating around 6:54 PM. He went to see his primary care physician and was advised to come in to the emergency room. He had another presyncopal spell the same day. I reviewed the hospital records including the discharge summary. He had a echocardiogram on 03/31/2014 which showed mild tricuspid regurgitation, EF of  65-70%, no regional wall motion abnormalities, mild aortic regurgitation, mild mitral regurgitation, and mild concentric left ventricular hypertrophy. Carotid Doppler studies from 04/01/2014 showed: 1-39% internal carotid artery stenosis bilaterally. There was antegrade vertebral artery flow. He had a head CT without contrast on 03/30/2014: 1. Negative intracranial imaging. 2. Remote subcondylar left mandible fracture with chronic TMJ dislocation. In addition, I personally reviewed the images through the PACS system. He was discharged home. He was seen by his cardiologist on 04/09/2014, and did not have any orthostatic blood pressure drops. He had had systolic blood pressures in the 160s in the hospital. His blood pressure at the time of his office visit with Dr. Debara Pickett was 132/87. Dr. Debara Pickett felt that he would not be a good candidate for midodrine in the context of no significant orthostatic blood pressure drop and normal blood pressure values at baseline. He emailed me regarding a trial of Droxidopa. The patient was advised to start using compression stockings which were previously recommended to him but he did not follow through with that.  I saw him on 11/12/2012, at which time he presented after a recent syncopal spell. I felt at the time that the syncope was secondary to a combination of things including autonomic dysregulation in the context of Parkinson's disease, exposure to heat for prolonged period of time with dehydration and quick changes in position as well as potential medication side effects. He had a head CT which was negative. He also had recent blood work. I did not change his medications at the time but talk to him about supportive measures including changing positions slowly, avoid prolonged heat exposure, staying well hydrated and using a stool for working in the yard and taking frequent breaks when working outside.    He had an event of LOC on 10/30/12. He reported that he had not been  feeling well for the past 2 weeks prior to his syncope. He had been feeling dizzy especially with change of position with intermittent nausea reported. He had been in his yard working for several hours and became dizzy and had a blackout spell. He fell into the bushes, and came to after some seconds. He was bending up and down at lot. After he regained consciousness, he was feeling weak and eventually was able to go to the porch, where he sat for about 45 minutes. His wife was at work. He had some slight confusion and lack of energy, but denied any focal weakness/CP/palpitations/SOB. He did not have any prior syncopal spells. He had blood work at the Urgent Care and saw Dr. Tamala Julian on 10/31/2012. His BUN was borderline at 24, he had a CTH on 10/31/12, which was negative and yesterday went for cardiological consultation with Dr. Debara Pickett, who felt, that he had a vasovagal event in the context of dysautonomia with PD.   I first met him on 06/05/12, at which time I felt his Parkinson's disease was stable and I did not make any medication changes.   He previously followed with Dr. Morene Antu and was last seen by him on 02/05/2012, at which time he  was complaining of right hip pain. No medication changes were done at the time. He has been on Synthroid, Sinemet 25/100 mg strength one tablet 3 times a day, Requip 2 mg strength half a pill at 6:30, half a pill at noon, half a pill at 5 PM and one and a half a pill at 9 PM. He is on rasagiline 1 mg once daily, baby aspirin, multivitamin, Toviaz.   He received a steroid injection into the R hip in 8/14, which helped the pain. He worked for [employer]. He lives with his wife in a one storey at [facility]. They have 2 grown children, son and daughter, both local. Two MAs and 4 cousins had Huntington's and he has been tested and was negative for it. Dr. Erling Cruz arranged the genetic testing. He has occasional depressive Sx, but better when he can get out and walk, he walks  about 6 miles/day.   He was diagnosed with Parkinson's disease in July 2009, with symptoms going back a year prior and consisted of trouble with speech and eating. He was initially started on Requip, Azilect and and Sinemet. He had side effects from the Requip including excessive spending, daytime sleepiness and hypersexuality. His Requip dose was therefore decreased. He exercises regularly. He is independent in his ADLs. He has had visual hallucinations. He fell in October 2011 fracturing his left distal radius, requiring surgery in December 2011. He had right-sided leg cramps improved with Flexeril, but stopped the medication. He denied leg edema, or compulsive behavior. He has bladder incontinence. In December 2013 his MMSE was 29, clock drawing was poor and animal fluency was 17, geriatric depression scale was 8/15.    His Past Medical History Is Significant For: Past Medical History  Diagnosis Date  . Thyroid disease   . Allergy   . Depression   . Parkinson's disease ([place])   . Anemia   . Parkinson's disease ([place]) 06/05/2012  . Raynaud's syndrome     His Past Surgical History Is Significant For: Past Surgical History  Procedure Laterality Date  . Mandible fracture surgery    . Hernia repair    . Wrist fracture surgery    . Rotator cuff repair    . Tonsillectomy    . Fracture surgery      jaw  . Vasectomy    . Appendectomy      His Family History Is Significant For: Family History  Problem Relation Age of Onset  . Cancer Father     colon    His Social History Is Significant For: Social History   Social History  . Marital Status: Married    Spouse Name: [name]  . Number of Children: 2  . Years of Education: 12+   Occupational History  . DISABILITY     due to Parkinson's   Social History Main Topics  . Smoking status: Never Smoker   . Smokeless tobacco: Never Used  . Alcohol Use: 0.0 - 4.2 oz/week    0-7 Cans of beer per week     Comment: occas.  . Drug Use: No   . Sexual Activity: No   Other Topics Concern  . None   Social History Narrative   Patient is married [name])  with 2 children.   Patient is right handed.   Patient has high school education.   Patient drinks 1-2 cups daily.    His Allergies Are:  Allergies  Allergen Reactions  . Ibuprofen Other (See Comments)  Blood in stool.  . Lamictal [Lamotrigine] Other (See Comments)    Blisters in mouth  . Nsaids   . Penicillins   :   His Current Medications Are:  Outpatient Encounter Prescriptions as of 12/14/2014  Medication Sig  . aspirin 81 MG tablet Take 160 mg by mouth daily.  . Biotin (BIOTIN 5000) 5 MG CAPS Take 1 capsule by mouth daily.  . carbidopa-levodopa (SINEMET IR) 25-100 MG per tablet Take 1 tablet by mouth 4 (four) times daily.  . Fenugreek 610 MG CAPS Take by mouth.  . levothyroxine (SYNTHROID, LEVOTHROID) 50 MCG tablet Take 1 tablet (50 mcg total) by mouth daily.  Marland Kitchen levothyroxine (SYNTHROID, LEVOTHROID) 75 MCG tablet   . Multiple Vitamins-Minerals (MULTIVITAMIN WITH MINERALS) tablet Take 1 tablet by mouth daily.  . rasagiline (AZILECT) 1 MG TABS tablet Take 1 tablet (1 mg total) by mouth daily.  Marland Kitchen rOPINIRole (REQUIP) 2 MG tablet TAKE 1/2 TABLET BY MOUTH AT 6:30AM, 1/2 TABLET BY MOUTH AT NOON, 1/2 TABLET BY MOUTH AT 5PM, AND 1 1/2 TABLETS AT 9PM  . [DISCONTINUED] Melatonin 5 MG TABS Take by mouth.   Facility-Administered Encounter Medications as of 12/14/2014  Medication  . cyanocobalamin ((VITAMIN B-12)) injection 1,000 mcg  :  Review of Systems:  Out of a complete 14 point review of systems, all are reviewed and negative with the exception of these symptoms as listed below:   Review of Systems  Neurological:       Patient is having trouble sleeping. Trouble verbalizing his thoughts. General weakness and fatigue. Memory loss.      Objective:  Neurologic Exam  Physical Exam Physical Examination:   Filed Vitals:   12/14/14 0936  BP: 110/64  Pulse:  62  Resp: 14   General Examination: The patient is a very pleasant 69 y.o. male in no acute distress.  HEENT: Normocephalic, atraumatic, pupils are equal, round and reactive to light and accommodation. Funduscopy is normal. Extraocular tracking shows mild saccadic breakdown without nystagmus noted. There is no limitation to his gaze. There is mild decrease in eye blink rate. Hearing is intact. Face is symmetric with moderate facial masking and normal facial sensation. There is no lip, neck or jaw tremor. Neck is moderately rigid with intact passive ROM. There are no carotid bruits on auscultation. Oropharynx exam reveals mild mouth dryness. No significant airway crowding is noted. Mallampati is class II. Tongue protrudes centrally and palate elevates symmetrically. He has minimal drooling.   Chest: is clear to auscultation without wheezing, rhonchi or crackles noted.  Heart: sounds are regular and normal without murmurs, rubs or gallops noted.   Abdomen: is soft, non-tender and non-distended with normal bowel sounds appreciated on auscultation.  Extremities: There is no pitting edema in the distal lower extremities bilaterally. Pedal pulses are intact.  Skin: is warm and dry with no trophic changes noted.  Musculoskeletal: exam reveals no obvious joint deformities, tenderness or joint swelling or erythema.  Neurologically:  Mental status: The patient is awake and alert, paying good attention. He is able to completely provide the history. He is oriented to: person, place, time/date, situation, day of week, month of year and year. His memory, attention, language and knowledge are intact. There is no aphasia, agnosia, apraxia or anomia. There is a mild degree of bradyphrenia. Speech is moderately hypophonic with minimal dysarthria noted. Mood is congruent and affect is normal.  Cranial nerves are as described above under HEENT exam. In addition, shoulder shrug is normal with  equal shoulder height  noted.  Motor exam: Normal bulk, and strength for age is noted. Tone is mildly rigid with presence of cogwheeling in the right upper extremity. There is overall moderate bradykinesia. There is no drift or rebound. There is no tremor.  Romberg is negative. Reflexes are 2+ in the upper extremities and 2+ in the lower extremities. Fine motor skills exam reveals: Finger taps are moderately impaired on the right and mildly impaired on the left. Hand movements are moderately impaired on the right and mildly impaired on the left. RAP (rapid alternating patting) is moderately impaired on the right and moderately impaired on the left. Foot taps are moderately impaired on the right and mild to moderately impaired on the left. Foot agility (in the form of heel stomping) is moderately impaired on the right and mildly impaired on the left.    Cerebellar testing shows no dysmetria or intention tremor on finger to nose testing. There is no truncal or gait ataxia. Heel to shin is unremarkable b/l.   Sensory exam is intact to light touch in the UEs and LEs bilaterally.  Gait, station and balance: He stands up from the seated position with no significance difficulty and needs no assistance. He does no feel lightheaded. No veering to one side is noted. He is not noted to lean to the side. Posture is mildly stooped. No lightheadedness reported. Stance is narrow-based. He walks with decrease in stride length and pace and decreased arm swing on the right and mild dystonic posturing with the right arm and hand, unchanged. He turns in 3 stepsand there is slight insecurity with turn. Tandem walk is not possible. Balance is mildly impaired.   Assessment and Plan:   In summary, [name] is a very pleasant 69 year old male with a history of right-sided predominant Parkinson's disease of the akinetic-rigid type, complicated by a syncopal spell in September 2014 and again in  February 2016, for which he had full workup and  has also seen cardiology. He may very well have Parkinson's related autonomic dysregulation which often results in supine hypertension and sudden drops in blood pressure and falls and often some injuries. Motor-wise, he has remained stable. He has more difficulty with sleep and his memory is worse. He has more difficulty with mood including depression and anxiety symptoms. He is currently not suicidal. He has had fleeting suicidal thoughts in the past. He was on a mood stabilizer in the past from what I understand. He had a reaction to Lamictal, including blisters in his mouth and a rash. They worried about Stevens-Johnson syndrome at the time and he was sent to [facility] for immediate workup and observation for this as I understand. Melatonin was not helpful for sleep and he did not like the way he felt on gabapentin which was low dose. Today, I suggested that we keep his Sinemet at one pill 4 times a day, Requip 2 mg strength half a pill 3 times a day and 1-1/2 pills at bedtime, Azilect once daily, and for mood I suggested trial of Celexa, 10 mg each night. Of note, the patient has a history of medication sensitivities and intolerances. We will consider a referral to a psychiatrist down the [place]. I renewed prescriptions for Sinemet, Azilect, and provided a new prescription for Celexa and instructions today. He is advised that it may take weeks, as long as 6 weeks for the medication to start working. He and his wife demonstrated understanding and agreement. I would like  to see him back in 4 months, sooner if needed. I answered all her questions today and they were in agreement.   I spent 25 min in total face-to-face time with the patient, more 50% of which was spent in counseling and coordination of care, reviewing test results, reviewing medication and reviewing the diagnosis of PD, insomnia, depression, the prognosis and treatment options.

## 2014-12-14 NOTE — Patient Instructions (Addendum)
Let's keep your medication the same, but I would like to suggest Celexa (generic name: citalopram), starting with 10 mg each night. It may take weeks (even as long as 6 weeks) for it to work.  Try to stay active mentally and physically, drink enough water, about 6-8 glasses per day and eat well.  We will consider a referral to psychiatry in the future.

## 2015-02-17 ENCOUNTER — Other Ambulatory Visit: Payer: Self-pay | Admitting: Emergency Medicine

## 2015-02-20 ENCOUNTER — Other Ambulatory Visit: Payer: Self-pay | Admitting: Neurology

## 2015-03-08 ENCOUNTER — Telehealth: Payer: Self-pay | Admitting: Neurology

## 2015-03-08 NOTE — Telephone Encounter (Signed)
Okay to provide letter of support to exclude patient for life from jury duty.

## 2015-03-08 NOTE — Telephone Encounter (Signed)
Letter is ready and will be placed at front desk or given to [facility].

## 2015-03-08 NOTE — Telephone Encounter (Signed)
Ok to provide letter

## 2015-03-08 NOTE — Telephone Encounter (Signed)
Patient's wife is calling and states that her husband needs a letter stating that her husband is unable because of his disability to serve on jury duty.  She has a document she will leave at the front desk.  Thanks!

## 2015-03-09 ENCOUNTER — Telehealth: Payer: Self-pay | Admitting: *Deleted

## 2015-03-09 DIAGNOSIS — Z0289 Encounter for other administrative examinations: Secondary | ICD-10-CM

## 2015-03-09 NOTE — Telephone Encounter (Signed)
Patient Jury Duty letter at the front desk.

## 2015-04-03 ENCOUNTER — Ambulatory Visit (INDEPENDENT_AMBULATORY_CARE_PROVIDER_SITE_OTHER): Payer: Medicare Other | Admitting: Family Medicine

## 2015-04-03 VITALS — BP 118/62 | HR 79 | Temp 98.9°F | Resp 20 | Ht 68.0 in | Wt 122.6 lb

## 2015-04-03 DIAGNOSIS — J069 Acute upper respiratory infection, unspecified: Secondary | ICD-10-CM

## 2015-04-03 DIAGNOSIS — Z87442 Personal history of urinary calculi: Secondary | ICD-10-CM | POA: Diagnosis not present

## 2015-04-03 DIAGNOSIS — R05 Cough: Secondary | ICD-10-CM

## 2015-04-03 DIAGNOSIS — R42 Dizziness and giddiness: Secondary | ICD-10-CM | POA: Diagnosis not present

## 2015-04-03 DIAGNOSIS — F32A Depression, unspecified: Secondary | ICD-10-CM

## 2015-04-03 DIAGNOSIS — F329 Major depressive disorder, single episode, unspecified: Secondary | ICD-10-CM | POA: Diagnosis not present

## 2015-04-03 DIAGNOSIS — R059 Cough, unspecified: Secondary | ICD-10-CM

## 2015-04-03 LAB — POC MICROSCOPIC URINALYSIS (UMFC): Mucus: ABSENT

## 2015-04-03 LAB — POCT URINALYSIS DIP (MANUAL ENTRY)
Bilirubin, UA: NEGATIVE
Blood, UA: NEGATIVE
Glucose, UA: NEGATIVE
LEUKOCYTES UA: NEGATIVE
NITRITE UA: NEGATIVE
PH UA: 5.5
PROTEIN UA: NEGATIVE
Spec Grav, UA: 1.015
Urobilinogen, UA: 0.2

## 2015-04-03 LAB — POCT CBC
Granulocyte percent: 84.1 %G — AB (ref 37–80)
HCT, POC: 42.3 % — AB (ref 43.5–53.7)
HEMOGLOBIN: 14.4 g/dL (ref 14.1–18.1)
LYMPH, POC: 0.6 (ref 0.6–3.4)
MCH: 30.1 pg (ref 27–31.2)
MCHC: 34.1 g/dL (ref 31.8–35.4)
MCV: 88.1 fL (ref 80–97)
MID (cbc): 0.4 (ref 0–0.9)
MPV: 7.9 fL (ref 0–99.8)
POC Granulocyte: 5.5 (ref 2–6.9)
POC LYMPH PERCENT: 10 %L (ref 10–50)
POC MID %: 5.9 %M (ref 0–12)
Platelet Count, POC: 177 10*3/uL (ref 142–424)
RBC: 4.8 M/uL (ref 4.69–6.13)
RDW, POC: 14.5 %
WBC: 6.5 10*3/uL (ref 4.6–10.2)

## 2015-04-03 MED ORDER — HYDROCODONE-HOMATROPINE 5-1.5 MG/5ML PO SYRP: ORAL_SOLUTION | ORAL | Status: DC

## 2015-04-03 NOTE — Progress Notes (Addendum)
Subjective:  By signing my name below, I, [name], attest that this documentation has been prepared under the direction and in the presence of Travis Ray, MD.  [name], Medical Scribe. 04/03/2015.  9:46 AM.  I personally performed the services described in this documentation, which was scribed in my presence. The recorded information has been reviewed and considered, and addended by me as needed.     Patient ID: [name], male    DOB: Oct 21, 1945, 70 y.o.   MRN: [id number]  Chief Complaint  Patient presents with  . Cough    x 1 week  . Sore Throat  . Depression    see screening    HPI HPI Comments: [name] is a 70 y.o. male who presents to [facility] complaining of cough, gradual onset 2 days ago.  Pt has associated symptoms sore throat, rhinorrhea, sleep disturbance secondary to the cough. Pt reports sick contact with his wife who presents with similar episodes. He did not take any medications for the symptoms. He is UTD with the flu vaccine. He denies fever.   Weakness: Pt reports that he is feeling weakness that has associated secondary near-syncope episode that started this morning. Pt is anemic, however he is not currently taking any iron supplements for this. Pt has not had much to eat this morning due to loss of appetite, however he drank some mild amounts of fluid. Pt has had a syncopal episode previously with weakness for which he followed up with a neurologist for. He is not on any medications for this. He denies a syncopal episode.   Kidney stone: Pt reports that he has the sensation of passing a kidney stone due to feeling that something is moving in the right abdominal area. He denies current back or abdominal pain. He reports having a hx of this, last episode was 6 months ago that passed with no interventions.  He indicates that he usually does not have trouble passing the stones. Pt follows up with a urologist.    Depression: Pt had a positive depression screening on triage.  Depression screen [facility] 2/9 04/03/2015 10/21/2014 07/28/2014 07/12/2014 04/16/2014  Decreased Interest 3 1 2 2  0  Down, Depressed, Hopeless 2 1 2 2 1   PHQ - 2 Score 5 2 4 4 1   Altered sleeping 1 0 2 3 -  Tired, decreased energy 3 3 3 3  -  Change in appetite 3 0 0 0 -  Feeling bad or failure about yourself  1 1 2 1  -  Trouble concentrating 2 3 3 2  -  Moving slowly or fidgety/restless 1 3 3 2  -  Suicidal thoughts 0 0 0 0 -  PHQ-9 Score 16 12 17 15  -  Difficult doing work/chores - Very difficult Very difficult Somewhat difficult -  He denies suicidal ideation. Depression have been discussed with him previously, and he is not interested in medication or psychotherapy, throught to be due to declining health. He is currently on treatment for parkinson's disease. Today, pt is still not interested in psycho threpaist. He indicates that he is currently compliant with taking Celexa 10 mg. He reports that he has not found much relief with this. He is to follow up with Dr. Rexene Alberts this month for this. Pt denies SI, or thoughts of hurting self or others. PCP is Travis, Lina Sayre, MD   Patient Active Problem List   Diagnosis Date Noted  . 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  Insomnia 03/31/2014  . Syncope 11/11/2012  . Autonomic postural hypotension 11/11/2012  . Parkinson's disease ([place]) 06/05/2012  . Restless leg syndrome 11/15/2011  . Hypothyroid 09/11/2011  . Parkinsonism ([place]) 04/02/2011  . Depression 04/02/2011   Past Medical History  Diagnosis Date  . Thyroid disease   . Allergy   . Depression   . Parkinson's disease ([place])   . Anemia   . Parkinson's disease ([place]) 06/05/2012  . Foster's syndrome    Past Surgical History  Procedure Laterality Date  . Mandible fracture surgery    . Hernia repair    . Wrist fracture surgery    . Rotator cuff repair    . Tonsillectomy    . Fracture surgery      jaw  . Vasectomy    .  Appendectomy     Allergies  Allergen Reactions  . Ibuprofen Other (See Comments)    Blood in stool.  . Lamictal [Lamotrigine] Other (See Comments)    Blisters in mouth  . Nsaids   . Penicillins    Prior to Admission medications   Medication Sig Start Date End Date Taking? Authorizing Provider  aspirin 81 MG tablet Take 160 mg by mouth daily.   Yes Historical Provider, MD  AZILECT 1 MG TABS tablet TAKE 1 TABLET(1 MG) BY MOUTH DAILY 02/22/15  Yes Star Age, MD  Biotin (BIOTIN 5000) 5 MG CAPS Take 1 capsule by mouth daily.   Yes Historical Provider, MD  carbidopa-levodopa (SINEMET IR) 25-100 MG tablet Take 1 tablet by mouth 4 (four) times daily. 12/14/14  Yes Star Age, MD  citalopram (CELEXA) 10 MG tablet Take 1 tablet (10 mg total) by mouth daily. 12/14/14  Yes Star Age, MD  [place] 610 MG CAPS Take by mouth.   Yes Historical Provider, MD  levothyroxine (SYNTHROID, LEVOTHROID) 50 MCG tablet Take 1 tablet (50 mcg total) by mouth daily. 11/16/14  Yes Darlyne Russian, MD  levothyroxine (SYNTHROID, LEVOTHROID) 75 MCG tablet  11/11/14  Yes Historical Provider, MD  Multiple Vitamins-Minerals (MULTIVITAMIN WITH MINERALS) tablet Take 1 tablet by mouth daily.   Yes Historical Provider, MD  rasagiline (AZILECT) 1 MG TABS tablet Take 1 tablet (1 mg total) by mouth daily. 12/14/14  Yes Star Age, MD  rOPINIRole (REQUIP) 2 MG tablet TAKE 1/2 TABLET BY MOUTH AT 6:30AM, 1/2 TABLET BY MOUTH AT NOON, 1/2 TABLET BY MOUTH AT 5PM, AND 1 1/2 TABLETS AT 9PM 08/14/14  Yes Star Age, MD   Social History   Social History  . Marital Status: Married    Spouse Name: [name]  . Number of Children: 2  . Years of Education: 12+   Occupational History  . DISABILITY     due to Parkinson's   Social History Main Topics  . Smoking status: Never Smoker   . Smokeless tobacco: Never Used  . Alcohol Use: 0.0 - 4.2 oz/week    0-7 Cans of beer per week     Comment: occas.  . Drug Use: No  . Sexual Activity: No    Other Topics Concern  . Not on file   Social History Narrative   Patient is married [name])  with 2 children.   Patient is right handed.   Patient has high school education.   Patient drinks 1-2 cups daily.    Review of Systems  Constitutional: Negative for fever.  HENT: Positive for rhinorrhea and sore throat.   Respiratory: Positive for cough.   Gastrointestinal: Negative for abdominal pain.  Musculoskeletal: Negative for back pain.  Neurological: Positive for weakness. Negative for syncope.  Psychiatric/Behavioral: Positive for sleep disturbance and dysphoric mood. Negative for suicidal ideas and self-injury.      Objective:   Physical Exam  Constitutional: He is oriented to person, place, and time. He appears well-developed and well-nourished. No distress.  HENT:  Head: Normocephalic and atraumatic.  Right Ear: Tympanic membrane, external ear and ear canal normal.  Left Ear: Tympanic membrane, external ear and ear canal normal.  Nose: No rhinorrhea.  Mouth/Throat: Oropharynx is clear and moist and mucous membranes are normal. No oropharyngeal exudate or posterior oropharyngeal erythema.  Moist oral mucosa.  Eyes: Conjunctivae and EOM are normal. Pupils are equal, round, and reactive to light.  Neck: Neck supple.  Cardiovascular: Normal rate, regular rhythm and intact distal pulses.   Murmur (Grade II) heard. Pulmonary/Chest: Effort normal and breath sounds normal. No respiratory distress. He has no wheezes. He has no rhonchi. He has no rales.  Abdominal: Soft. There is no tenderness.  Musculoskeletal: He exhibits no edema (in the lower extrimities).  Lymphadenopathy:    He has no cervical adenopathy.  Neurological: He is alert and oriented to person, place, and time. No cranial nerve deficit.  Skin: Skin is warm and dry. No rash noted.  Psychiatric: He has a normal mood and affect. His behavior is normal.  Nursing note and vitals reviewed.   Filed Vitals:    04/03/15 0919  BP: 118/62  Foster: 79  Temp: 98.9 F (37.2 C)  TempSrc: Oral  Resp: 20  Height: 5\' 8"  (1.727 m)  Weight: 122 lb 9.6 oz (55.611 kg)  SpO2: 97%    Results for orders placed or performed in visit on 04/03/15  POCT urinalysis dipstick  Result Value Ref Range   Color, UA yellow yellow   Clarity, UA clear clear   Glucose, UA negative negative   Bilirubin, UA negative negative   Ketones, POC UA trace (5) (A) negative   Spec Grav, UA 1.015    Blood, UA negative negative   pH, UA 5.5    Protein Ur, POC negative negative   Urobilinogen, UA 0.2    Nitrite, UA Negative Negative   Leukocytes, UA Negative Negative  POCT Microscopic Urinalysis ([facility])  Result Value Ref Range   WBC,UR,HPF,POC None None WBC/hpf   RBC,UR,HPF,POC Few (A) None RBC/hpf   Bacteria None None, Too numerous to count   Mucus Absent Absent   Epithelial Cells, UR Per Microscopy Few (A) None, Too numerous to count cells/hpf  POCT CBC  Result Value Ref Range   WBC 6.5 4.6 - 10.2 K/uL   Lymph, poc 0.6 0.6 - 3.4   POC LYMPH PERCENT 10.0 10 - 50 %L   MID (cbc) 0.4 0 - 0.9   POC MID % 5.9 0 - 12 %M   POC Granulocyte 5.5 2 - 6.9   Granulocyte percent 84.1 (A) 37 - 80 %G   RBC 4.80 4.69 - 6.13 M/uL   Hemoglobin 14.4 14.1 - 18.1 g/dL   HCT, POC 42.3 (A) 43.5 - 53.7 %   MCV 88.1 80 - 97 fL   MCH, POC 30.1 27 - 31.2 pg   MCHC 34.1 31.8 - 35.4 g/dL   RDW, POC 14.5 %   Platelet Count, POC 177 142 - 424 K/uL   MPV 7.9 0 - 99.8 fL      Assessment & Plan:   JAYDIAN IWANICKI is a 70 y.o. male Dizziness -  Plan: POCT CBC  - Suspect relative volume depletion, symptoms of dizziness in the past. May also be multifactorial with his current medications. Advised to increase fluid intake, RTC/ER precautions if worsening. Keep follow-up as planned with neurologist.  Acute upper respiratory infection,  Cough - Plan: POCT CBC, HYDROcodone-homatropine (HYCODAN) 5-1.5 MG/5ML syrup  -Likely viral URI. Continue  over-the-counter mucus relief during the day, increase fluids, Hycodan at night if needed for cough that is preventing him from sleeping, but side effects were discussed and cautioned on fall, dizziness risks with this medication.  Depression  -Caryl Comes referral for therapist/CVT. Continue Celexa, but discussed dosing with upcoming appointment at neurology. Can call if he would like numbers for therapist.  History of kidney stones - Plan: POCT urinalysis dipstick, POCT Microscopic Urinalysis ([facility])  -Few RBCs on urine, no pain currently, increase fluids, RTC precautions if pain or worsening symptoms with history of nephrolithiasis.  Meds ordered this encounter  Medications  . HYDROcodone-homatropine (HYCODAN) 5-1.5 MG/5ML syrup    Sig: 67m by mouth a bedtime as needed for cough.    Dispense:  120 mL    Refill:  0   Patient Instructions  Increase your fluid intake, both for possible kidney stone passing and for your current respiratory infection. Can continue the over-the-counter mucus relief such as Mucinex or Mucinex DM, drink plenty of fluids, cough drops such as Cepacol if needed, and Hycodan cough syrup at night if needed for cough. If you do use cough syrup, be very careful as it can also cause sedation or dizziness and increased risk of falls. Use lowest effective dose.   Discuss depression symptoms with your neurologist at upcoming visit to determine if change in medicines needed. If at any point you would like to meet with a counselor, let me know and I'll be happy to provide their information.   Upper Respiratory Infection, Adult Most upper respiratory infections (URIs) are a viral infection of the air passages leading to the lungs. A URI affects the nose, throat, and upper air passages. The most common type of URI is nasopharyngitis and is typically referred to as "the common cold." URIs run their course and usually go away on their own. Most of the time, a URI does not require medical  attention, but sometimes a bacterial infection in the upper airways can follow a viral infection. This is called a secondary infection. Sinus and middle ear infections are common types of secondary upper respiratory infections. Bacterial pneumonia can also complicate a URI. A URI can worsen asthma and chronic obstructive pulmonary disease (COPD). Sometimes, these complications can require emergency medical care and may be life threatening.  CAUSES Almost all URIs are caused by viruses. A virus is a type of germ and can spread from one person to another.  RISKS FACTORS You may be at risk for a URI if:   You smoke.   You have chronic heart or lung disease.  You have a weakened defense (immune) system.   You are very young or very old.   You have nasal allergies or asthma.  You work in crowded or poorly ventilated areas.  You work in health care facilities or schools. SIGNS AND SYMPTOMS  Symptoms typically develop 2-3 days after you come in contact with a cold virus. Most viral URIs last 7-10 days. However, viral URIs from the influenza virus (flu virus) can last 14-18 days and are typically more severe. Symptoms may include:   Runny or stuffy (congested) nose.  Sneezing.   Cough.   Sore throat.   Headache.   Fatigue.   Fever.   Loss of appetite.   Pain in your forehead, behind your eyes, and over your cheekbones (sinus pain).  Muscle aches.  DIAGNOSIS  Your health care provider may diagnose a URI by:  Physical exam.  Tests to check that your symptoms are not due to another condition such as:  Strep throat.  Sinusitis.  Pneumonia.  Asthma. TREATMENT  A URI goes away on its own with time. It cannot be cured with medicines, but medicines may be prescribed or recommended to relieve symptoms. Medicines may help:  Reduce your fever.  Reduce your cough.  Relieve nasal congestion. HOME CARE INSTRUCTIONS   Take medicines only as directed by your  health care provider.   Gargle warm saltwater or take cough drops to comfort your throat as directed by your health care provider.  Use a warm mist humidifier or inhale steam from a shower to increase air moisture. This may make it easier to breathe.  Drink enough fluid to keep your urine clear or pale yellow.   Eat soups and other clear broths and maintain good nutrition.   Rest as needed.   Return to work when your temperature has returned to normal or as your health care provider advises. You may need to stay home longer to avoid infecting others. You can also use a face mask and careful hand washing to prevent spread of the virus.  Increase the usage of your inhaler if you have asthma.   Do not use any tobacco products, including cigarettes, chewing tobacco, or electronic cigarettes. If you need help quitting, ask your health care provider. PREVENTION  The best way to protect yourself from getting a cold is to practice good hygiene.   Avoid oral or hand contact with people with cold symptoms.   Wash your hands often if contact occurs.  There is no clear evidence that vitamin C, vitamin E, echinacea, or exercise reduces the chance of developing a cold. However, it is always recommended to get plenty of rest, exercise, and practice good nutrition.  SEEK MEDICAL CARE IF:   You are getting worse rather than better.   Your symptoms are not controlled by medicine.   You have chills.  You have worsening shortness of breath.  You have brown or red mucus.  You have yellow or brown nasal discharge.  You have pain in your face, especially when you bend forward.  You have a fever.  You have swollen neck glands.  You have pain while swallowing.  You have white areas in the back of your throat. SEEK IMMEDIATE MEDICAL CARE IF:   You have severe or persistent:  Headache.  Ear pain.  Sinus pain.  Chest pain.  You have chronic lung disease and any of the  following:  Wheezing.  Prolonged cough.  Coughing up blood.  A change in your usual mucus.  You have a stiff neck.  You have changes in your:  Vision.  Hearing.  Thinking.  Mood. MAKE SURE YOU:   Understand these instructions.  Will watch your condition.  Will get help right away if you are not doing well or get worse.   This information is not intended to replace advice given to you by your health care provider. Make sure you discuss any questions you have with your health care provider.   Document Released: 08/02/2000 Document Revised: 06/23/2014 Document Reviewed: 05/14/2013 Elsevier Interactive Patient  Education 2016 [employer]   Cough, Adult Coughing is a reflex that clears your throat and your airways. Coughing helps to heal and protect your lungs. It is normal to cough occasionally, but a cough that happens with other symptoms or lasts a long time may be a sign of a condition that needs treatment. A cough may last only 2-3 weeks (acute), or it may last longer than 8 weeks (chronic). CAUSES Coughing is commonly caused by:  Breathing in substances that irritate your lungs.  A viral or bacterial respiratory infection.  Allergies.  Asthma.  Postnasal drip.  Smoking.  Acid backing up from the stomach into the esophagus (gastroesophageal reflux).  Certain medicines.  Chronic lung problems, including COPD (or rarely, lung cancer).  Other medical conditions such as heart failure. HOME CARE INSTRUCTIONS  Pay attention to any changes in your symptoms. Take these actions to help with your discomfort:  Take medicines only as told by your health care provider.  If you were prescribed an antibiotic medicine, take it as told by your health care provider. Do not stop taking the antibiotic even if you start to feel better.  Talk with your health care provider before you take a cough suppressant medicine.  Drink enough fluid to keep your urine clear or  pale yellow.  If the air is dry, use a cold steam vaporizer or humidifier in your bedroom or your home to help loosen secretions.  Avoid anything that causes you to cough at work or at home.  If your cough is worse at night, try sleeping in a semi-upright position.  Avoid cigarette smoke. If you smoke, quit smoking. If you need help quitting, ask your health care provider.  Avoid caffeine.  Avoid alcohol.  Rest as needed. SEEK MEDICAL CARE IF:   You have new symptoms.  You cough up pus.  Your cough does not get better after 2-3 weeks, or your cough gets worse.  You cannot control your cough with suppressant medicines and you are losing sleep.  You develop pain that is getting worse or pain that is not controlled with pain medicines.  You have a fever.  You have unexplained weight loss.  You have night sweats. SEEK IMMEDIATE MEDICAL CARE IF:  You cough up blood.  You have difficulty breathing.  Your heartbeat is very fast.   This information is not intended to replace advice given to you by your health care provider. Make sure you discuss any questions you have with your health care provider.   Document Released: 08/05/2010 Document Revised: 10/28/2014 Document Reviewed: 04/15/2014 Elsevier Interactive Patient Education [employer].     I personally performed the services described in this documentation, which was scribed in my presence. The recorded information has been reviewed and considered, and addended by me as needed.

## 2015-04-03 NOTE — Patient Instructions (Addendum)
Increase your fluid intake, both for possible kidney stone passing and for your current respiratory infection. Can continue the over-the-counter mucus relief such as Mucinex or Mucinex DM, drink plenty of fluids, cough drops such as Cepacol if needed, and Hycodan cough syrup at night if needed for cough. If you do use cough syrup, be very careful as it can also cause sedation or dizziness and increased risk of falls. Use lowest effective dose.   Discuss depression symptoms with your neurologist at upcoming visit to determine if change in medicines needed. If at any point you would like to meet with a counselor, let me know and I'll be happy to provide their information.   Upper Respiratory Infection, Adult Most upper respiratory infections (URIs) are a viral infection of the air passages leading to the lungs. A URI affects the nose, throat, and upper air passages. The most common type of URI is nasopharyngitis and is typically referred to as "the common cold." URIs run their course and usually go away on their own. Most of the time, a URI does not require medical attention, but sometimes a bacterial infection in the upper airways can follow a viral infection. This is called a secondary infection. Sinus and middle ear infections are common types of secondary upper respiratory infections. Bacterial pneumonia can also complicate a URI. A URI can worsen asthma and chronic obstructive pulmonary disease (COPD). Sometimes, these complications can require emergency medical care and may be life threatening.  CAUSES Almost all URIs are caused by viruses. A virus is a type of germ and can spread from one person to another.  RISKS FACTORS You may be at risk for a URI if:   You smoke.   You have chronic heart or lung disease.  You have a weakened defense (immune) system.   You are very young or very old.   You have nasal allergies or asthma.  You work in crowded or poorly ventilated areas.  You work  in health care facilities or schools. SIGNS AND SYMPTOMS  Symptoms typically develop 2-3 days after you come in contact with a cold virus. Most viral URIs last 7-10 days. However, viral URIs from the influenza virus (flu virus) can last 14-18 days and are typically more severe. Symptoms may include:   Runny or stuffy (congested) nose.   Sneezing.   Cough.   Sore throat.   Headache.   Fatigue.   Fever.   Loss of appetite.   Pain in your forehead, behind your eyes, and over your cheekbones (sinus pain).  Muscle aches.  DIAGNOSIS  Your health care provider may diagnose a URI by:  Physical exam.  Tests to check that your symptoms are not due to another condition such as:  Strep throat.  Sinusitis.  Pneumonia.  Asthma. TREATMENT  A URI goes away on its own with time. It cannot be cured with medicines, but medicines may be prescribed or recommended to relieve symptoms. Medicines may help:  Reduce your fever.  Reduce your cough.  Relieve nasal congestion. HOME CARE INSTRUCTIONS   Take medicines only as directed by your health care provider.   Gargle warm saltwater or take cough drops to comfort your throat as directed by your health care provider.  Use a warm mist humidifier or inhale steam from a shower to increase air moisture. This may make it easier to breathe.  Drink enough fluid to keep your urine clear or pale yellow.   Eat soups and other clear broths and maintain  good nutrition.   Rest as needed.   Return to work when your temperature has returned to normal or as your health care provider advises. You may need to stay home longer to avoid infecting others. You can also use a face mask and careful hand washing to prevent spread of the virus.  Increase the usage of your inhaler if you have asthma.   Do not use any tobacco products, including cigarettes, chewing tobacco, or electronic cigarettes. If you need help quitting, ask your health  care provider. PREVENTION  The best way to protect yourself from getting a cold is to practice good hygiene.   Avoid oral or hand contact with people with cold symptoms.   Wash your hands often if contact occurs.  There is no clear evidence that vitamin C, vitamin E, echinacea, or exercise reduces the chance of developing a cold. However, it is always recommended to get plenty of rest, exercise, and practice good nutrition.  SEEK MEDICAL CARE IF:   You are getting worse rather than better.   Your symptoms are not controlled by medicine.   You have chills.  You have worsening shortness of breath.  You have brown or red mucus.  You have yellow or brown nasal discharge.  You have pain in your face, especially when you bend forward.  You have a fever.  You have swollen neck glands.  You have pain while swallowing.  You have white areas in the back of your throat. SEEK IMMEDIATE MEDICAL CARE IF:   You have severe or persistent:  Headache.  Ear pain.  Sinus pain.  Chest pain.  You have chronic lung disease and any of the following:  Wheezing.  Prolonged cough.  Coughing up blood.  A change in your usual mucus.  You have a stiff neck.  You have changes in your:  Vision.  Hearing.  Thinking.  Mood. MAKE SURE YOU:   Understand these instructions.  Will watch your condition.  Will get help right away if you are not doing well or get worse.   This information is not intended to replace advice given to you by your health care provider. Make sure you discuss any questions you have with your health care provider.   Document Released: 08/02/2000 Document Revised: 06/23/2014 Document Reviewed: 05/14/2013 Elsevier Interactive Patient Education 2016 [employer]   Cough, Adult Coughing is a reflex that clears your throat and your airways. Coughing helps to heal and protect your lungs. It is normal to cough occasionally, but a cough that happens with  other symptoms or lasts a long time may be a sign of a condition that needs treatment. A cough may last only 2-3 weeks (acute), or it may last longer than 8 weeks (chronic). CAUSES Coughing is commonly caused by:  Breathing in substances that irritate your lungs.  A viral or bacterial respiratory infection.  Allergies.  Asthma.  Postnasal drip.  Smoking.  Acid backing up from the stomach into the esophagus (gastroesophageal reflux).  Certain medicines.  Chronic lung problems, including COPD (or rarely, lung cancer).  Other medical conditions such as heart failure. HOME CARE INSTRUCTIONS  Pay attention to any changes in your symptoms. Take these actions to help with your discomfort:  Take medicines only as told by your health care provider.  If you were prescribed an antibiotic medicine, take it as told by your health care provider. Do not stop taking the antibiotic even if you start to feel better.  Talk with your  health care provider before you take a cough suppressant medicine.  Drink enough fluid to keep your urine clear or pale yellow.  If the air is dry, use a cold steam vaporizer or humidifier in your bedroom or your home to help loosen secretions.  Avoid anything that causes you to cough at work or at home.  If your cough is worse at night, try sleeping in a semi-upright position.  Avoid cigarette smoke. If you smoke, quit smoking. If you need help quitting, ask your health care provider.  Avoid caffeine.  Avoid alcohol.  Rest as needed. SEEK MEDICAL CARE IF:   You have new symptoms.  You cough up pus.  Your cough does not get better after 2-3 weeks, or your cough gets worse.  You cannot control your cough with suppressant medicines and you are losing sleep.  You develop pain that is getting worse or pain that is not controlled with pain medicines.  You have a fever.  You have unexplained weight loss.  You have night sweats. SEEK IMMEDIATE  MEDICAL CARE IF:  You cough up blood.  You have difficulty breathing.  Your heartbeat is very fast.   This information is not intended to replace advice given to you by your health care provider. Make sure you discuss any questions you have with your health care provider.   Document Released: 08/05/2010 Document Revised: 10/28/2014 Document Reviewed: 04/15/2014 Elsevier Interactive Patient Education [employer].

## 2015-04-19 ENCOUNTER — Ambulatory Visit (INDEPENDENT_AMBULATORY_CARE_PROVIDER_SITE_OTHER): Payer: Medicare Other | Admitting: Neurology

## 2015-04-19 ENCOUNTER — Encounter: Payer: Self-pay | Admitting: Neurology

## 2015-04-19 VITALS — BP 100/55 | HR 59 | Resp 14 | Ht 68.0 in | Wt 121.0 lb

## 2015-04-19 DIAGNOSIS — G903 Multi-system degeneration of the autonomic nervous system: Secondary | ICD-10-CM

## 2015-04-19 DIAGNOSIS — G20 Parkinson's disease: Secondary | ICD-10-CM

## 2015-04-19 MED ORDER — CITALOPRAM HYDROBROMIDE 10 MG PO TABS: 10.0000 mg | ORAL_TABLET | Freq: Every day | ORAL | Status: DC

## 2015-04-19 MED ORDER — ROPINIROLE HCL 2 MG PO TABS: ORAL_TABLET | ORAL | Status: DC

## 2015-04-19 MED ORDER — RASAGILINE MESYLATE 1 MG PO TABS: 1.0000 mg | ORAL_TABLET | Freq: Every day | ORAL | Status: DC

## 2015-04-19 MED ORDER — FLUDROCORTISONE ACETATE 0.1 MG PO TABS: ORAL_TABLET | ORAL | Status: DC

## 2015-04-19 MED ORDER — CARBIDOPA-LEVODOPA 25-100 MG PO TABS: 1.0000 | ORAL_TABLET | Freq: Four times a day (QID) | ORAL | Status: DC

## 2015-04-19 NOTE — Patient Instructions (Signed)
We will keep your Parkinson's meds and your citalopram the same.  We will try low dose Florinef (generic name: fludrocortisone) to help keep your blood pressure up and prevent fainting sensation or syncopal spells:  0.1 mg strength, take 1/2 pill at 10 AM daily for about 2 weeks, then 1 pill daily thereafter. Side effects include headache, feeling dizzy, blood pressure elevation. Keep a log of your blood pressure and pulse, take it sitting down around 12 (noon), every 2-3 days. Call if you have questions.

## 2015-04-19 NOTE — Progress Notes (Signed)
Subjective:    [name] ID: [name] is a 70 y.o. male.  HPI     Interim history:   [name] is a very pleasant 70 year old right-handed gentleman with an underlying medical history of hypothyroidism, OSA (not using CPAP currently, but tried in Travis past), RLS, MCI, CTS, depression, anemia, and allergies, who presents for followup consultation of his right-sided predominant, akinetic-rigid type Parkinson's disease, complicated by sleep disorder, RLS, memory loss, prior hallucinations, prior fall with injury, depression, and syncope. [name] is accompanied by his Foster today. I last saw [name] on 12/14/2014, at which time [name] reported feeling more sedated. His Foster noted more word finding difficuties, more forgetfulness, more fatigued, less physically active. [name] felt more depressed and had voiced suicidal ideations in Travis past but denied them at Travis time or recently and had no intent. [name] has a history of a reaction to Lamictal in Travis past. Is more depressed has voiced SI in Travis past, but not recently and no recent intent. [name] had a reaction to lamictal in Travis past. [name] has abnormal posturing of his right upper extremity while walking and sitting sometimes. [name] has trouble maintaining sleep. [name] tried Travis gabapentin but did not feel good on it so [name] stopped it. Melatonin has not helped his sleep. I suggested that we keep his Sinemet at one pill 4 times a day, Requip 2 mg strength half a pill 3 times a day and 1-1/2 pills at bedtime, Azilect once daily, and for mood I suggested trial of Celexa, 10 mg each night.   Today, 04/19/2015: [name] reports having had occasional episodes of lightheadedness upon standing. Sometimes [name] just feels drained. [name] has low energy. Motor-wise, [name] feels fairly stable. Mood wise, [name] feels that Travis Celexa has helped. His Foster has noted that [name] just feels very fatigued often. [name] has not had any fainting spells thankfully. [name] has lost a little bit more weight. [name] tries to eat well and she  reports that his appetite is good.[name] tries to drink enough water. [name] has not tried Travis compression stockings lately.   Previously:  I saw [name] on 08/14/2014, at which time [name] reported more fatigue over Travis past 2 months. [name] reported having more stiffness in Travis afternoon. [name] received a B12 injection recently through his primary care physician. His B12 level was 428 at Travis time. His TSH was normal. [name] reported trouble staying asleep and melatonin was not helpful. [name] was drinking more water and had had no recent lightheadedness or fainting spell and no falls. [name] had some low back pain. [name] had a lumbar spine x-ray on 07/12/2014: Lumbar spine scoliosis with associated degenerative changes. I suggested we increase his Sinemet to 4 times a day and keep his ropinirole is same. I suggested low-dose gabapentin at night for his back pain and for sleep.  I saw [name] on 04/15/2014, at which time [name] reported feeling fine. [name] had no lightheadedness. [name] had not been using his compression stockings and in fact had not bought them yet. [name] did admit that [name] was not drinking enough water, estimating about 1 glass per day. [name] said [name] would walk 6 miles a day [name] a typically with his Foster. I suggested [name] drink more water. [name] was advised to start using compression stockings every day, not at night. [name] was advised to monitor his blood pressure. For sleep I suggested [name] try melatonin. I suggested a one-month follow-up with our nurse practitioner. I kept his medications Travis  same. In Travis interim, [name] was seen by Ward Givens, NP on 05/14/2014. [name] was felt to be stable. [name] had no new complaints. [name] was advised to continue to stay well-hydrated and use compression stockings. [name] was advised to continue taking Azilect, Sinemet and Requip.   I saw [name] on 05/12/13, at which time [name] reported lightheadedness and his BP was in fact low. [name] denies any recent syncopal spells and no falls. [name] stopped Toviaz some 4 months prior (due to cost). [name]  denied recent illness and recent medication changes. [name] had some word finding difficulty. [name] was driving and had not had recent issues driving. [name] had no constipation or hallucinations and was sleeping well.  In Travis interim, [name] was seen by our nurse practitioner, Ms. Clabe Seal on 11/13/2013, at which time [name] was stable, his blood pressure was stable, and [name] was kept on Travis same medications including Azilect, Sinemet and Requip.  In Travis interim, [name] was hospitalized from 03/30/2014 through 04/01/2014 for her syncopal spell. [name] had a syncopal event at home on 03/30/2014 and found himself down after having a warning sign of dizziness, and sweating around 6:54 PM. [name] went to see his primary care physician and was advised to come in to Travis emergency room. [name] had another presyncopal spell Travis same day. I reviewed Travis hospital records including Travis discharge summary. [name] had a echocardiogram on 03/31/2014 which showed mild tricuspid regurgitation, EF of 65-70%, no regional wall motion abnormalities, mild aortic regurgitation, mild mitral regurgitation, and mild concentric left ventricular hypertrophy. Carotid Doppler studies from 04/01/2014 showed: 1-39% internal carotid artery stenosis bilaterally. There was antegrade vertebral artery flow. [name] had a head CT without contrast on 03/30/2014: 1. Negative intracranial imaging. 2. Remote subcondylar left mandible fracture with chronic TMJ dislocation. In addition, I personally reviewed Travis images through Travis PACS system. [name] was discharged home. [name] was seen by his cardiologist on 04/09/2014, and did not have any orthostatic blood pressure drops. [name] had had systolic blood pressures in Travis 160s in Travis hospital. His blood pressure at Travis time of his office visit with Dr. Debara Pickett was 132/87. Dr. Debara Pickett felt that [name] would not be a good candidate for midodrine in Travis context of no significant orthostatic blood pressure drop and normal blood pressure values at baseline. [name] emailed me  regarding a trial of Droxidopa. [name] was advised to start using compression stockings which were previously recommended to [name] but [name] did not follow through with that.  I saw [name] on 11/12/2012, at which time [name] presented after a recent syncopal spell. I felt at Travis time that Travis syncope was secondary to a combination of things including autonomic dysregulation in Travis context of Parkinson's disease, exposure to heat for prolonged period of time with dehydration and quick changes in position as well as potential medication side effects. [name] had a head CT which was negative. [name] also had recent blood work. I did not change his medications at Travis time but talk to [name] about supportive measures including changing positions slowly, avoid prolonged heat exposure, staying well hydrated and using a stool for working in Travis yard and taking frequent breaks when working outside.    [name] had an event of LOC on 10/30/12. [name] reported that [name] had not been feeling well for Travis past 2 weeks prior to his syncope. [name] had been feeling dizzy especially with change of position with intermittent nausea reported. [name] had been in his yard working for several hours and  became dizzy and had a blackout spell. [name] fell into Travis bushes, and came to after some seconds. [name] was bending up and down at lot. After [name] regained consciousness, [name] was feeling weak and eventually was able to go to Travis porch, where [name] sat for about 45 minutes. His Foster was at work. [name] had some slight confusion and lack of energy, but denied any focal weakness/CP/palpitations/SOB. [name] did not have any prior syncopal spells. [name] had blood work at [facility] and saw Dr. Tamala Julian on 10/31/2012. His BUN was borderline at 24, [name] had a CTH on 10/31/12, which was negative and yesterday went for cardiological consultation with Dr. Debara Pickett, who felt, that [name] had a vasovagal event in Travis context of dysautonomia with PD.   I first met [name] on 06/05/12, at which time I felt his  Parkinson's disease was stable and I did not make any medication changes.   [name] previously followed with Dr. Morene Antu and was last seen by [name] on 02/05/2012, at which time [name] was complaining of right hip pain. No medication changes were done at Travis time. [name] has been on Synthroid, Sinemet 25/100 mg strength one tablet 3 times a day, Requip 2 mg strength half a pill at 6:30, half a pill at noon, half a pill at 5 PM and one and a half a pill at 9 PM. [name] is on rasagiline 1 mg once daily, baby aspirin, multivitamin, Toviaz.   [name] received a steroid injection into Travis R hip in 8/14, which helped Travis pain. [name] worked for [employer]. [name] lives with his Foster in a one storey at [facility]. They have 2 grown children, son and daughter, both local. Two MAs and 4 cousins had Huntington's and [name] has been tested and was negative for it. Dr. Erling Cruz arranged Travis genetic testing. [name] has occasional depressive Sx, but better when [name] can get out and walk, [name] walks about 6 miles/day.   [name] was diagnosed with Parkinson's disease in July 2009, with symptoms going back a year prior and consisted of trouble with speech and eating. [name] was initially started on Requip, Azilect and and Sinemet. [name] had side effects from Travis Requip including excessive spending, daytime sleepiness and hypersexuality. His Requip dose was therefore decreased. [name] exercises regularly. [name] is independent in his ADLs. [name] has had visual hallucinations. [name] fell in October 2011 fracturing his left distal radius, requiring surgery in December 2011. [name] had right-sided leg cramps improved with Flexeril, but stopped Travis medication. [name] denied leg edema, or compulsive behavior. [name] has bladder incontinence. In December 2013 his MMSE was 29, clock drawing was poor and animal fluency was 17, geriatric depression scale was 8/15.      His Past Medical History Is Significant For: Past Medical History  Diagnosis Date  . Thyroid disease   . Allergy   . Depression   .  Parkinson's disease ([place])   . Anemia   . Parkinson's disease ([place]) 06/05/2012  . Raynaud's syndrome     His Past Surgical History Is Significant For: Past Surgical History  Procedure Laterality Date  . Mandible fracture surgery    . Hernia repair    . Wrist fracture surgery    . Rotator cuff repair    . Tonsillectomy    . Fracture surgery      jaw  . Vasectomy    . Appendectomy      His Family History Is Significant For: Family History  Problem  Relation Age of Onset  . Cancer Father     colon    His Social History Is Significant For: Social History   Social History  . Marital Status: Married    Spouse Name: [name]  . Number of Children: 2  . Years of Education: 12+   Occupational History  . DISABILITY     due to Parkinson's   Social History Main Topics  . Smoking status: Never Smoker   . Smokeless tobacco: Never Used  . Alcohol Use: 0.0 - 4.2 oz/week    0-7 Cans of beer per week     Comment: occas.  . Drug Use: No  . Sexual Activity: No   Other Topics Concern  . None   Social History Narrative   [name] is married [name])  with 2 children.   [name] is right handed.   [name] has high school education.   [name] drinks 1-2 cups daily.    His Allergies Are:  Allergies  Allergen Reactions  . Ibuprofen Other (See Comments)    Blood in stool.  . Lamictal [Lamotrigine] Other (See Comments)    Blisters in mouth  . Nsaids   . Penicillins   :   His Current Medications Are:  Outpatient Encounter Prescriptions as of 04/19/2015  Medication Sig  . aspirin 81 MG tablet Take 160 mg by mouth daily.  . AZILECT 1 MG TABS tablet TAKE 1 TABLET(1 MG) BY MOUTH DAILY  . Biotin 5000 MCG TABS Take by mouth.  . carbidopa-levodopa (SINEMET IR) 25-100 MG tablet Take 1 tablet by mouth 4 (four) times daily.  . citalopram (CELEXA) 10 MG tablet Take 1 tablet (10 mg total) by mouth daily.  . Fenugreek 610 MG CAPS Take by mouth.  . levothyroxine (SYNTHROID, LEVOTHROID) 50  MCG tablet Take 1 tablet (50 mcg total) by mouth daily.  Marland Kitchen levothyroxine (SYNTHROID, LEVOTHROID) 75 MCG tablet   . Multiple Vitamins-Minerals (MULTIVITAMIN WITH MINERALS) tablet Take 1 tablet by mouth daily.  . rasagiline (AZILECT) 1 MG TABS tablet Take 1 tablet (1 mg total) by mouth daily.  Marland Kitchen rOPINIRole (REQUIP) 2 MG tablet TAKE 1/2 TABLET BY MOUTH AT 6:30AM, 1/2 TABLET BY MOUTH AT NOON, 1/2 TABLET BY MOUTH AT 5PM, AND 1 1/2 TABLETS AT 9PM  . [DISCONTINUED] Biotin (BIOTIN 5000) 5 MG CAPS Take 1 each by mouth daily.   . [DISCONTINUED] HYDROcodone-homatropine (HYCODAN) 5-1.5 MG/5ML syrup 60mby mouth a bedtime as needed for cough.   Facility-Administered Encounter Medications as of 04/19/2015  Medication  . cyanocobalamin ((VITAMIN B-12)) injection 1,000 mcg  :  Review of Systems:  Out of a complete 14 point review of systems, all are reviewed and negative with Travis exception of these symptoms as listed below:   Review of Systems  Neurological:       Foster reports that [name] has had a couple of experiences where [name] felt like [name] was going to pass out.  Doing well on Citalopram.     Objective:  Neurologic Exam  Physical Exam  Physical Examination:   Filed Vitals:   04/19/15 1504 04/19/15 1510  BP: 98/58 100/55  Pulse: 57 59  Resp: 14 14   General Examination: [name] is a very pleasant 70y.o. male in no acute distress. [name] does not have significant lightheadedness upon standing and his standing blood pressure and pulse do not show any drop.  HEENT: Normocephalic, atraumatic, pupils are equal, round and reactive to light and accommodation. Funduscopy is normal. Extraocular tracking  shows mild saccadic breakdown without nystagmus noted. There is no limitation to his gaze. There is mild decrease in eye blink rate. Hearing is intact. Face is symmetric with moderate facial masking and normal facial sensation. There is no lip, neck or jaw tremor. Neck is moderately rigid with intact  passive ROM. There are no carotid bruits on auscultation. Oropharynx exam reveals mild mouth dryness. No significant airway crowding is noted. Mallampati is class II. Tongue protrudes centrally and palate elevates symmetrically. [name] has minimal drooling, unchanged.   Chest: is clear to auscultation without wheezing, rhonchi or crackles noted.  Heart: sounds are regular and normal without murmurs, rubs or gallops noted.   Abdomen: is soft, non-tender and non-distended with normal bowel sounds appreciated on auscultation.  Extremities: There is no pitting edema in Travis distal lower extremities bilaterally. Pedal pulses are intact.  Skin: is warm and dry with no trophic changes noted.  Musculoskeletal: exam reveals no obvious joint deformities, tenderness or joint swelling or erythema.  Neurologically:  Mental status: [name] is awake and alert, paying good attention. [name] is able to completely provide Travis history. [name] is oriented to: person, place, time/date, situation, day of week, month of year and year. His memory, attention, language and knowledge are intact. There is no aphasia, agnosia, apraxia or anomia. There is a mild degree of bradyphrenia. Speech is moderately hypophonic with minimal dysarthria noted. Mood is congruent and affect is normal.  Cranial nerves are as described above under HEENT exam. In addition, shoulder shrug is normal with equal shoulder height noted.  Motor exam: Normal bulk, and strength for age is noted. Tone is mildly rigid with presence of cogwheeling in Travis right upper extremity. There is overall moderate bradykinesia. There is no drift or rebound. There is no tremor. [name] has mild dystonic posturing of his right arm and hand. Romberg is negative. Reflexes are 2+ in Travis upper extremities and 2+ in Travis lower extremities. Fine motor skills exam reveals: Finger taps are moderately impaired on Travis right and mildly impaired on Travis left. Hand movements are moderately impaired  on Travis right and mildly impaired on Travis left. RAP (rapid alternating patting) is moderately impaired on Travis right and moderately impaired on Travis left. Foot taps are moderately impaired on Travis right and mild to moderately impaired on Travis left. Foot agility (in Travis form of heel stomping) is moderately impaired on Travis right and mildly impaired on Travis left.    Cerebellar testing shows no dysmetria or intention tremor on finger to nose testing. There is no truncal or gait ataxia. Heel to shin is unremarkable b/l.   Sensory exam is intact to light touch in Travis UEs and LEs bilaterally.  Gait, station and balance: [name] stands up from Travis seated position with no significance difficulty and needs no assistance. [name] does no feel lightheaded upon standing. No veering to one side is noted. [name] is not noted to lean to Travis side. Posture is mildly stooped. No lightheadedness reported. Stance is narrow-based. [name] walks with decrease in stride length and pace and decreased arm swing on Travis right and mild to moderated dystonic posturing with Travis right arm and hand, slightly worse. [name] turns in 3 steps and there is slight insecurity with turn. Tandem walk is possible, only with difficulty. Balance is mildly impaired.   Assessment and Plan:   In summary, BURAK ZERBE is a very pleasant 70 year old male with a history of right-sided predominant Parkinson's disease of Travis akinetic-rigid  type, complicated by a syncopal spells in September 2014 and again in  February 2016, for which [name] had full workup and has also seen cardiology. [name] may very well have Parkinson's related autonomic dysregulation which often results in supine hypertension at times and sudden drops in blood pressure and falls and often some injuries.sometimes, symptomatic neurogenic hypotension does not include significant drop in blood pressure values. At this juncture, I suggested we try [name] on low-dose fludrocortisone, 0.1 mg strength half a pill at 10 AM on a daily  basis. [name] is advised to monitor his blood pressure values, not daily but keep a log every 2 or 3 days. Side effects are discussed including hypertension, dizziness, headache. Motor-wise, [name] has remained stable. We will monitor his memory and mood. Mood wise [name] has done better with low-dose generic Celexa once daily. [name] is taking 10 mg at night. I renewed his prescription for Sinemet one pill 4 times a day, Azilect which will change to generic, ropinirole 2 mg strength half a pill 3 times a day and 1-1/2 pills at bedtime, and we will keep Celexa at Travis same dose. I provided a new prescription for fludrocortisone 0.1 mg strength half a pill once daily around 10 AM for 2 weeks then can increase to 1 pill once daily thereafter. I would like to see [name] back in 3 months from now, sooner if Travis need arises. I encouraged Travis [name] to call for any interim questions or concerns. I answered all their questions today and they were in agreement. I spent 25 min in total face-to-face time with [name], more 50% of which was spent in counseling and coordination of care, reviewing test results, reviewing medication and reviewing Travis diagnosis of PD, NOH, Travis prognosis and treatment options.

## 2015-05-18 ENCOUNTER — Ambulatory Visit (INDEPENDENT_AMBULATORY_CARE_PROVIDER_SITE_OTHER): Payer: Medicare Other | Admitting: Emergency Medicine

## 2015-05-18 VITALS — BP 154/86 | HR 69 | Temp 97.8°F | Resp 16 | Ht 67.0 in | Wt 126.0 lb

## 2015-05-18 DIAGNOSIS — Z23 Encounter for immunization: Secondary | ICD-10-CM

## 2015-05-18 DIAGNOSIS — S40021A Contusion of right upper arm, initial encounter: Secondary | ICD-10-CM

## 2015-05-18 NOTE — Patient Instructions (Addendum)
IF you received an x-ray today, you will receive an invoice from [facility]. Please contact [facility] at [phone] with questions or concerns regarding your invoice.   IF you received labwork today, you will receive an invoice from [employer]. Please contact [facility] at [phone] with questions or concerns regarding your invoice.   Our billing staff will not be able to assist you with questions regarding bills from these companies.  You will be contacted with the lab results as soon as they are available. The fastest way to get your results is to activate your My Chart account. Instructions are located on the last page of this paperwork. If you have not heard from [place] regarding the results in 2 weeks, please contact this office.    Ice the area insuring there is a barrier between you and the ice.   Let us know if there is no improvement in 1 week. Keep the dermabond from lotions and glue.  Wait 24 hours before washing.  Keep from submersion in water (like baths and dirty dishes)  Showers are ok.    Contusion A contusion is a deep bruise. Contusions are the result of a blunt injury to tissues and muscle fibers under the skin. The injury causes bleeding under the skin. The skin overlying the contusion may turn blue, purple, or yellow. Minor injuries will give you a painless contusion, but more severe contusions may stay painful and swollen for a few weeks.  CAUSES  This condition is usually caused by a blow, trauma, or direct force to an area of the body. SYMPTOMS  Symptoms of this condition include:  Swelling of the injured area.  Pain and tenderness in the injured area.  Discoloration. The area may have redness and then turn blue, purple, or yellow. DIAGNOSIS  This condition is diagnosed based on a physical exam and medical history. An X-ray, CT scan, or MRI may be needed to determine if there are any associated injuries, such as  broken bones (fractures). TREATMENT  Specific treatment for this condition depends on what area of the body was injured. In general, the best treatment for a contusion is resting, icing, applying pressure to (compression), and elevating the injured area. This is often called the RICE strategy. Over-the-counter anti-inflammatory medicines may also be recommended for pain control.  HOME CARE INSTRUCTIONS   Rest the injured area.  If directed, apply ice to the injured area:  Put ice in a plastic bag.  Place a towel between your skin and the bag.  Leave the ice on for 20 minutes, 2-3 times per day.  If directed, apply light compression to the injured area using an elastic bandage. Make sure the bandage is not wrapped too tightly. Remove and reapply the bandage as directed by your health care provider.  If possible, raise (elevate) the injured area above the level of your heart while you are sitting or lying down.  Take over-the-counter and prescription medicines only as told by your health care provider. SEEK MEDICAL CARE IF:  Your symptoms do not improve after several days of treatment.  Your symptoms get worse.  You have difficulty moving the injured area. SEEK IMMEDIATE MEDICAL CARE IF:   You have severe pain.  You have numbness in a hand or foot.  Your hand or foot turns pale or cold.   This information is not intended to replace advice given to you by your health care provider. Make sure you discuss any questions you  have with your health care provider.   Document Released: 11/16/2004 Document Revised: 10/28/2014 Document Reviewed: 06/24/2014 Elsevier Interactive Patient Education [employer].

## 2015-05-18 NOTE — Progress Notes (Signed)
[facility] [address] [place] [address] [phone]  Date:  05/18/2015   Name:  [name]   DOB:  [birth year]   MRN:  [id number]  PCP:  Jenny Reichmann, MD    History of Present Illness:  [name] is a 70 y.o. male patient who presents to [facility] for injury after falling out of bed.   6 hours ago, patient rolled out of bed, and fell on the floor.  He hit his head on the nightstand.  He fell on the hardwood floor.  He injured his ear.  Right deltoid was hit.  He cut his forearm on.  He has no numbness or tingling, weakness, and pain is localized to the bruising of the arm.  He had no loss of consiousness, dizzinesss, or dysequilibrium.  He woke his wife, wife cleaned the wounds with water only.      Patient Active Problem List   Diagnosis Date Noted  . Raynaud's phenomenon 04/16/2014  . Insomnia 03/31/2014  . Syncope 11/11/2012  . Autonomic postural hypotension 11/11/2012  . Parkinson's disease ([place]) 06/05/2012  . Restless leg syndrome 11/15/2011  . Hypothyroid 09/11/2011  . Parkinsonism ([place]) 04/02/2011  . Depression 04/02/2011   ,  Past Medical History  Diagnosis Date  . Thyroid disease   . Allergy   . Depression   . Parkinson's disease ([place])   . Anemia   . Parkinson's disease ([place]) 06/05/2012  . Raynaud's syndrome     Past Surgical History  Procedure Laterality Date  . Mandible fracture surgery    . Hernia repair    . Wrist fracture surgery    . Rotator cuff repair    . Tonsillectomy    . Fracture surgery      jaw  . Vasectomy    . Appendectomy      Social History  Substance Use Topics  . Smoking status: Never Smoker   . Smokeless tobacco: Never Used  . Alcohol Use: 0.0 - 4.2 oz/week    0-7 Cans of beer per week     Comment: occas.    Family History  Problem Relation Age of Onset  . Cancer Father     colon    Allergies  Allergen Reactions  . Ibuprofen Other (See Comments)    Blood in stool.  . Lamictal  [Lamotrigine] Other (See Comments)    Blisters in mouth  . Nsaids   . Penicillins     Medication list has been reviewed and updated.  Current Outpatient Prescriptions on File Prior to Visit  Medication Sig Dispense Refill  . aspirin 81 MG tablet Take 160 mg by mouth daily.    . Biotin 5000 MCG TABS Take by mouth.    . carbidopa-levodopa (SINEMET IR) 25-100 MG tablet Take 1 tablet by mouth 4 (four) times daily. 360 tablet 3  . citalopram (CELEXA) 10 MG tablet Take 1 tablet (10 mg total) by mouth daily. 90 tablet 3  . Fenugreek 610 MG CAPS Take by mouth.    . fludrocortisone (FLORINEF) 0.1 MG tablet 1/2 pill at 10 AM daily for 2 weeks, then 1 pill daily thereafter. 30 tablet 5  . levothyroxine (SYNTHROID, LEVOTHROID) 50 MCG tablet Take 1 tablet (50 mcg total) by mouth daily. 90 tablet 3  . levothyroxine (SYNTHROID, LEVOTHROID) 75 MCG tablet     . Multiple Vitamins-Minerals (MULTIVITAMIN WITH MINERALS) tablet Take 1 tablet by mouth daily.    . rasagiline (AZILECT)  1 MG TABS tablet Take 1 tablet (1 mg total) by mouth daily. 30 tablet 5  . rOPINIRole (REQUIP) 2 MG tablet TAKE 1/2 TABLET BY MOUTH AT 6:30AM, 1/2 TABLET BY MOUTH AT NOON, 1/2 TABLET BY MOUTH AT 5PM, AND 1 1/2 TABLETS AT 9PM 270 tablet 3   Current Facility-Administered Medications on File Prior to Visit  Medication Dose Route Frequency Provider Last Rate Last Dose  . cyanocobalamin ((VITAMIN B-12)) injection 1,000 mcg  1,000 mcg Intramuscular Q30 days Orma Flaming, MD   1,000 mcg at 07/28/14 1344    ROS ROS otherwise unremarkable unless listed above.   Physical Examination: BP 154/86 mmHg  Pulse 69  Temp(Src) 97.8 F (36.6 C)  Resp 16  Ht 5\' 7"  (1.702 m)  Wt 126 lb (57.153 kg)  BMI 19.73 kg/m2 Ideal Body Weight: Weight in (lb) to have BMI = 25: 159.3  Physical Exam  Constitutional: He is oriented to person, place, and time. He appears well-developed and well-nourished. No distress.  HENT:  Head: Normocephalic and  atraumatic.  Eyes: Conjunctivae and EOM are normal. Pupils are equal, round, and reactive to light.  Cardiovascular: Normal rate and regular rhythm.  Exam reveals no gallop and no friction rub.   No murmur heard. Pulmonary/Chest: Effort normal. No respiratory distress.  Musculoskeletal:       Cervical back: He exhibits normal range of motion and no tenderness.  Bruising at the distal end of the deltoid with tenderness.  Normal rom and resisted strength is normal.   Normal shoulder rotation.    Neurological: He is alert and oriented to person, place, and time.  Skin: Skin is warm and dry. He is not diaphoretic.  Superficial oblique laceration that extends minimally into the through the epidermis and proximates well--at the right forearm.  .4 laceration at the right external ear of the helix.  Psychiatric: He has a normal mood and affect. His behavior is normal.   Procedure: verbal consent obtained.  Cleansed with soap and water.  dermabond placed at the right forearm with pressure placed while sealing.  Dressing applied.  Assessment and Plan: [name] is a 70 y.o. male who is here today for cc of injury after falling out of the bed. --rice advised.. --dermabond placed at the wound of the forearm.  --updated prevnar   Arm contusion, right, initial encounter  Need for prophylactic vaccination against Streptococcus pneumoniae (pneumococcus) - Plan: Pneumococcal conjugate vaccine 13-valent IM  Ivar Drape, PA-C [facility] 3/28/20179:07 AM      Ivar Drape, PA-C [facility] 05/18/2015 8:21 AM

## 2015-05-24 ENCOUNTER — Encounter: Payer: Self-pay | Admitting: Neurology

## 2015-05-24 ENCOUNTER — Other Ambulatory Visit: Payer: Self-pay | Admitting: Neurology

## 2015-05-24 DIAGNOSIS — G20 Parkinson's disease: Secondary | ICD-10-CM

## 2015-05-24 MED ORDER — RASAGILINE MESYLATE 1 MG PO TABS: 1.0000 mg | ORAL_TABLET | Freq: Every day | ORAL | Status: DC

## 2015-05-24 NOTE — Telephone Encounter (Signed)
Patient request Rx to be re-sent to pharmacy.

## 2015-06-29 ENCOUNTER — Encounter: Payer: Self-pay | Admitting: Neurology

## 2015-07-05 ENCOUNTER — Other Ambulatory Visit: Payer: Self-pay | Admitting: Emergency Medicine

## 2015-07-07 ENCOUNTER — Encounter: Payer: Self-pay | Admitting: Emergency Medicine

## 2015-07-08 IMAGING — CR DG RIBS W/ CHEST 3+V*L*
4 series · 4 of 4 positions shown · non-contrast
Comparison: 08/11/2008

CLINICAL DATA: Left chest pain, rib pain.  Lifting injury.

EXAM:
LEFT RIBS AND CHEST - 3+ VIEW

[PA]
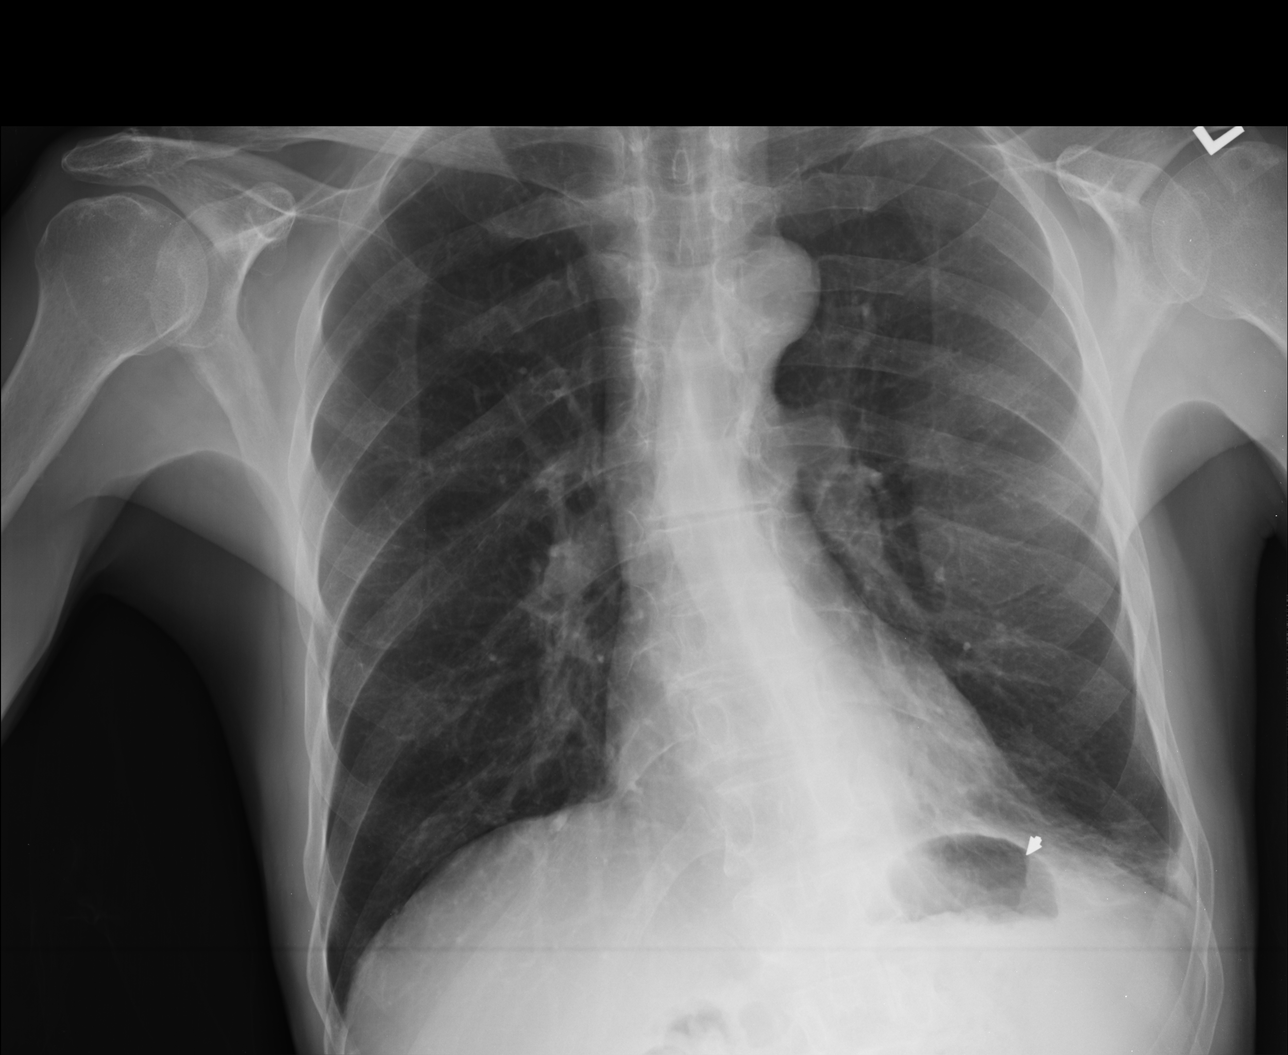

[rao]
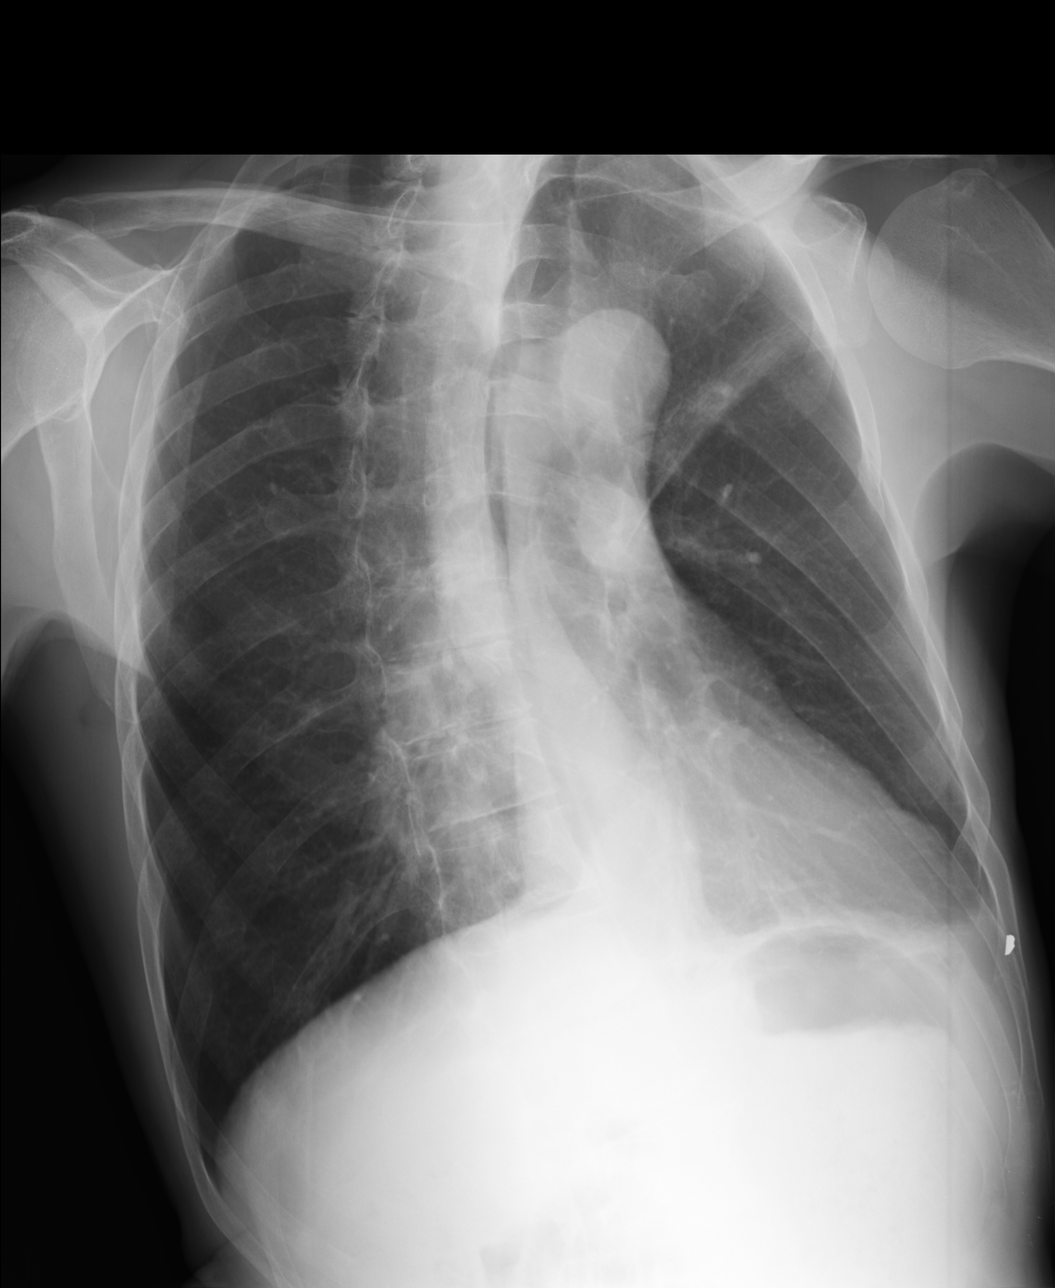

[lao]
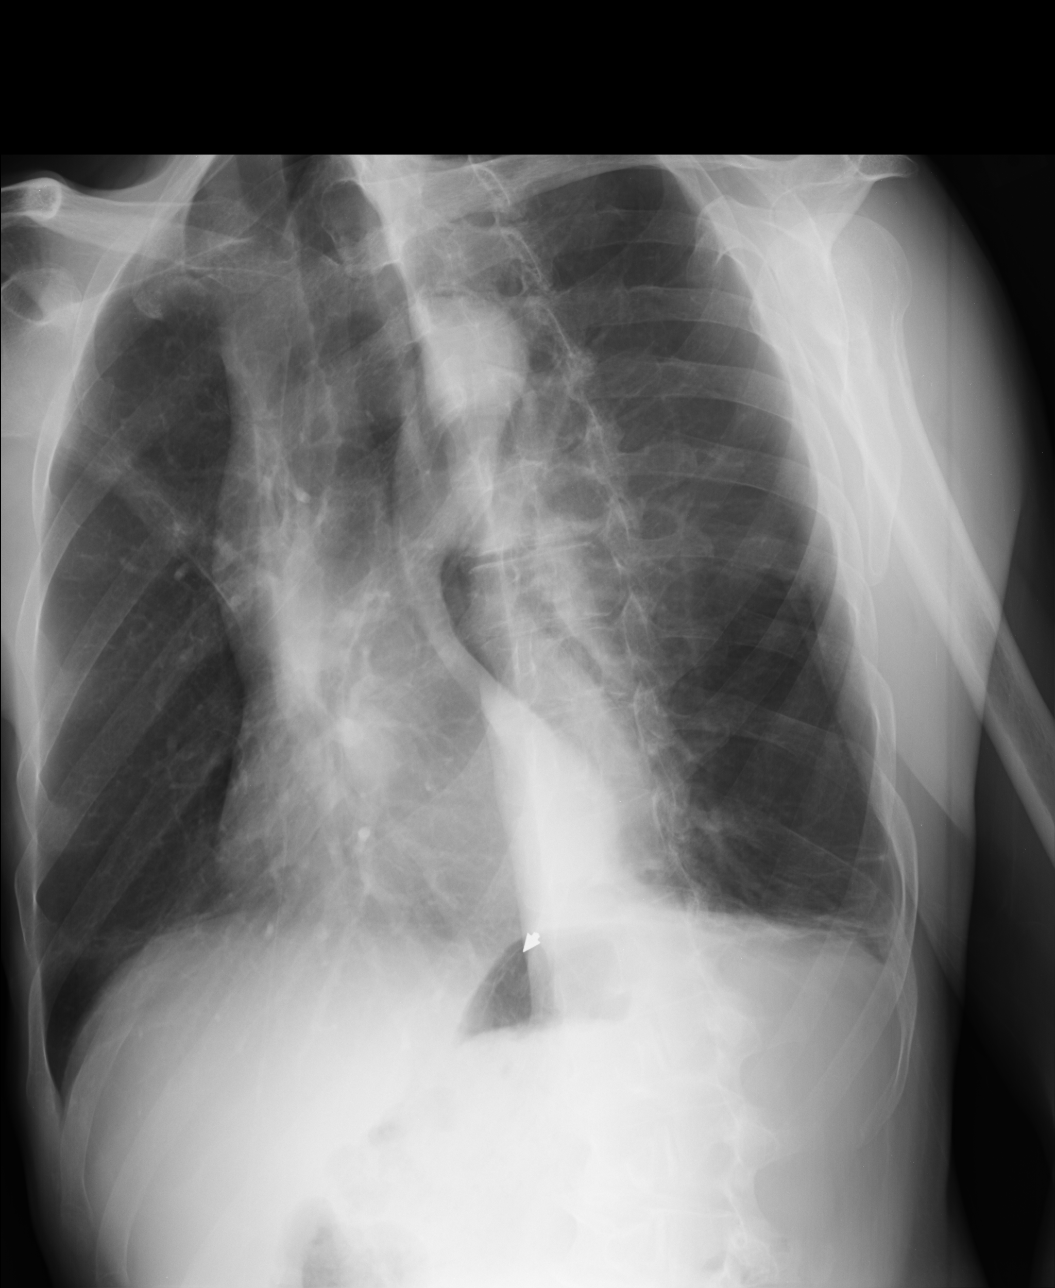

[AP]
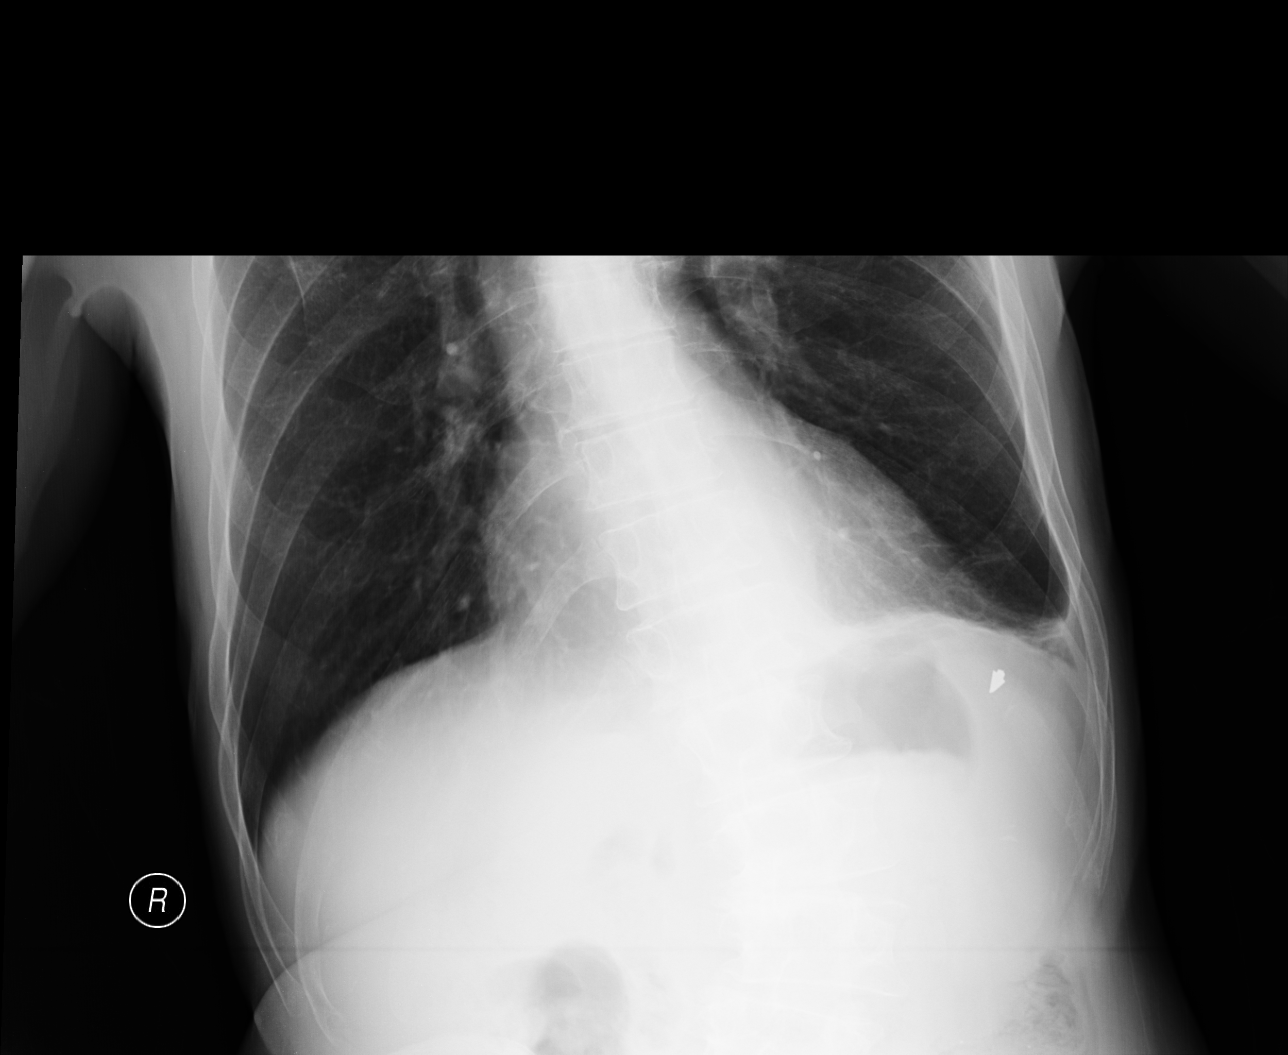

[4 of 4 positions shown; findings below may reference images not displayed]

FINDINGS: There is left base atelectasis. Trace left pleural effusion noted.
No visible rib fracture. No pneumothorax. Heart is normal size.
Mediastinal contours are within normal limits.
IMPRESSION: Left base atelectasis with trace left pleural effusion.

## 2015-07-22 ENCOUNTER — Ambulatory Visit: Payer: Medicare Other | Admitting: Neurology

## 2015-08-25 ENCOUNTER — Encounter: Payer: Self-pay | Admitting: Neurology

## 2015-08-25 ENCOUNTER — Ambulatory Visit (INDEPENDENT_AMBULATORY_CARE_PROVIDER_SITE_OTHER): Payer: Medicare Other | Admitting: Neurology

## 2015-08-25 VITALS — BP 108/54 | HR 70 | Resp 16 | Ht 68.0 in | Wt 123.0 lb

## 2015-08-25 DIAGNOSIS — G2581 Restless legs syndrome: Secondary | ICD-10-CM | POA: Diagnosis not present

## 2015-08-25 DIAGNOSIS — G20 Parkinson's disease: Secondary | ICD-10-CM

## 2015-08-25 DIAGNOSIS — G903 Multi-system degeneration of the autonomic nervous system: Secondary | ICD-10-CM

## 2015-08-25 DIAGNOSIS — R55 Syncope and collapse: Secondary | ICD-10-CM

## 2015-08-25 NOTE — Patient Instructions (Addendum)
Please continue to drink enough water. Use compression stockings, if you have lower blood pressure values or feel lightheaded.   Your weight is too low. Please drink enough water, eat well and think about adding some boost or ensure.   Constipation can be a big problem in advancing Parkinson's disease. It is important to be proactive: this includes ensuring adequate water intake and mobilization (walking around), utilizing stool softeners as needed, an over-the-counter laxative as needed up to daily if needed, adding a probiotic in pill form or in the form of yogurt can help as well. Sometimes, using a suppository or enema becomes necessary.   Do not let your dogs sleep in the bed with you please!

## 2015-08-25 NOTE — Progress Notes (Signed)
Subjective:    Patient ID: [name] is a 69 y.o. male.  HPI     Interim history:   [name] is a very pleasant 69-year-old right-handed gentleman with an underlying medical history of hypothyroidism, OSA (not using CPAP currently, but tried in the past), RLS, MCI, CTS, depression, anemia, and allergies, who presents for followup consultation of his right-sided predominant, akinetic-rigid type Parkinson's disease, complicated by sleep disorder, RLS, memory loss, prior hallucinations, prior fall with injury, depression, and syncope. He is accompanied by his wife today. I last saw him on 04/19/2015, at which time he reported having occasional lightheadedness upon standing. He had low energy and felt physically exhausted at times. Motor-wise, overall, he felt stable, mood wise, he felt that Celexa was helpful. He had no recent syncopal spells. He had lost a little bit of weight. He had not been using compression stockings.  Today, 08/25/2015: He reports, that he stopped the Florinef d/t SEs, including, nausea, just felt bad, "jucky". He stopped it about 3 months ago. Per wife, he has more memory issues, No hallucinations. Occasional constipation but does have a bowel movement every day, does not always feel complete though. In the interim, unfortunately, he fell out of bed and bumped his head and landed on his right arm. He went to urgent care and saw his primary care physician, Dr. Daub on 05/18/2015. I reviewed the note. He had two dogs in bed with him at the time. His side effects went away after he stopped the Florinef. He feels that his blood pressure has remained stable, denies any recent lightheadedness or presyncopal spells or falls. He does stay active. Likes to be in the yard. Patient's wife asked about using medicinal marijuana and I explained to them, that it is not recommended for PD at this time and is not without concern for SEs.    Previously:  I saw him on 12/14/2014, at which  time he reported feeling more sedated. His wife noted more word finding difficuties, more forgetfulness, more fatigued, less physically active. He felt more depressed and had voiced suicidal ideations in the past but denied them at the time or recently and had no intent. He has a history of a reaction to Lamictal in the past. Is more depressed has voiced SI in the past, but not recently and no recent intent. He had a reaction to lamictal in the past. He has abnormal posturing of his right upper extremity while walking and sitting sometimes. He has trouble maintaining sleep. He tried the gabapentin but did not feel good on it so he stopped it. Melatonin has not helped his sleep. I suggested that we keep his Sinemet at one pill 4 times a day, Requip 2 mg strength half a pill 3 times a day and 1-1/2 pills at bedtime, Azilect once daily, and for mood I suggested trial of Celexa, 10 mg each night.   I saw him on 08/14/2014, at which time he reported more fatigue over the past 2 months. He reported having more stiffness in the afternoon. He received a B12 injection recently through his primary care physician. His B12 level was 428 at the time. His TSH was normal. He reported trouble staying asleep and melatonin was not helpful. He was drinking more water and had had no recent lightheadedness or fainting spell and no falls. He had some low back pain. He had a lumbar spine x-ray on 07/12/2014: Lumbar spine scoliosis with associated degenerative changes. I suggested we increase   his Sinemet to 4 times a day and keep his ropinirole is same. I suggested low-dose gabapentin at night for his back pain and for sleep.  I saw him on 04/15/2014, at which time he reported feeling fine. He had no lightheadedness. He had not been using his compression stockings and in fact had not bought them yet. He did admit that he was not drinking enough water, estimating about 1 glass per day. He said he would walk 6 miles a day him a typically  with his wife. I suggested he drink more water. He was advised to start using compression stockings every day, not at night. He was advised to monitor his blood pressure. For sleep I suggested he try melatonin. I suggested a one-month follow-up with our nurse practitioner. I kept his medications the same. In the interim, he was seen by Ward Givens, NP on 05/14/2014. He was felt to be stable. He had no new complaints. He was advised to continue to stay well-hydrated and use compression stockings. He was advised to continue taking Azilect, Sinemet and Requip.   I saw him on 05/12/13, at which time he reported lightheadedness and his BP was in fact low. He denies any recent syncopal spells and no falls. He stopped Toviaz some 4 months prior (due to cost). He denied recent illness and recent medication changes. He had some word finding difficulty. He was driving and had not had recent issues driving. He had no constipation or hallucinations and was sleeping well.  In the interim, he was seen by our nurse practitioner, Ms. Clabe Seal on 11/13/2013, at which time he was stable, his blood pressure was stable, and he was kept on the same medications including Azilect, Sinemet and Requip.  In the interim, he was hospitalized from 03/30/2014 through 04/01/2014 for a syncopal spell. He had a syncopal event at home on 03/30/2014 and found himself down after having a warning sign of dizziness, and sweating around 6:54 PM. He went to see his primary care physician and was advised to come in to the emergency room. He had another presyncopal spell the same day. I reviewed the hospital records including the discharge summary. He had a echocardiogram on 03/31/2014 which showed mild tricuspid regurgitation, EF of 65-70%, no regional wall motion abnormalities, mild aortic regurgitation, mild mitral regurgitation, and mild concentric left ventricular hypertrophy. Carotid Doppler studies from 04/01/2014 showed: 1-39% internal  carotid artery stenosis bilaterally. There was antegrade vertebral artery flow. He had a head CT without contrast on 03/30/2014: 1. Negative intracranial imaging. 2. Remote subcondylar left mandible fracture with chronic TMJ dislocation. In addition, I personally reviewed the images through the PACS system. He was discharged home. He was seen by his cardiologist on 04/09/2014, and did not have any orthostatic blood pressure drops. He had had systolic blood pressures in the 160s in the hospital. His blood pressure at the time of his office visit with Dr. Debara Pickett was 132/87. Dr. Debara Pickett felt that he would not be a good candidate for midodrine in the context of no significant orthostatic blood pressure drop and normal blood pressure values at baseline. He emailed me regarding a trial of Droxidopa. The patient was advised to start using compression stockings which were previously recommended to him but he did not follow through with that.  I saw him on 11/12/2012, at which time he presented after a recent syncopal spell. I felt at the time that the syncope was secondary to a combination of things including autonomic dysregulation in  the context of Parkinson's disease, exposure to heat for prolonged period of time with dehydration and quick changes in position as well as potential medication side effects. He had a head CT which was negative. He also had recent blood work. I did not change his medications at the time but talk to him about supportive measures including changing positions slowly, avoid prolonged heat exposure, staying well hydrated and using a stool for working in the yard and taking frequent breaks when working outside.   He had an event of LOC on 10/30/12. He reported that he had not been feeling well for the past 2 weeks prior to his syncope. He had been feeling dizzy especially with change of position with intermittent nausea reported. He had been in his yard working for several hours and became dizzy  and had a blackout spell. He fell into the bushes, and came to after some seconds. He was bending up and down at lot. After he regained consciousness, he was feeling weak and eventually was able to go to the porch, where he sat for about 45 minutes. His wife was at work. He had some slight confusion and lack of energy, but denied any focal weakness/CP/palpitations/SOB. He did not have any prior syncopal spells. He had blood work at the Urgent Care and saw Dr. Tamala Julian on 10/31/2012. His BUN was borderline at 24, he had a CTH on 10/31/12, which was negative and yesterday went for cardiological consultation with Dr. Debara Pickett, who felt, that he had a vasovagal event in the context of dysautonomia with PD.   I first met him on 06/05/12, at which time I felt his Parkinson's disease was stable and I did not make any medication changes.   He previously followed with Dr. Morene Antu and was last seen by him on 02/05/2012, at which time he was complaining of right hip pain. No medication changes were done at the time. He has been on Synthroid, Sinemet 25/100 mg strength one tablet 3 times a day, Requip 2 mg strength half a pill at 6:30, half a pill at noon, half a pill at 5 PM and one and a half a pill at 9 PM. He is on rasagiline 1 mg once daily, baby aspirin, multivitamin, Toviaz.   He received a steroid injection into the R hip in 8/14, which helped the pain. He worked for [employer]. He lives with his wife in a one storey at [facility]. They have 2 grown children, son and daughter, both local. Two MAs and 4 cousins had Huntington's and he has been tested and was negative for it. Dr. Erling Cruz arranged the genetic testing. He has occasional depressive Sx, but better when he can get out and walk, he walks about 6 miles/day.   He was diagnosed with Parkinson's disease in July 2009, with symptoms going back a year prior and consisted of trouble with speech and eating. He was initially started on Requip, Azilect and and  Sinemet. He had side effects from the Requip including excessive spending, daytime sleepiness and hypersexuality. His Requip dose was therefore decreased. He exercises regularly. He is independent in his ADLs. He has had visual hallucinations. He fell in October 2011 fracturing his left distal radius, requiring surgery in December 2011. He had right-sided leg cramps improved with Flexeril, but stopped the medication. He denied leg edema, or compulsive behavior. He has bladder incontinence. In December 2013 his MMSE was 29, clock drawing was poor and animal fluency was 17, geriatric depression  scale was 8/15.    His Past Medical History Is Significant For: Past Medical History  Diagnosis Date  . Thyroid disease   . Allergy   . Depression   . Parkinson's disease (HCC)   . Anemia   . Parkinson's disease (HCC) 06/05/2012  . Raynaud's syndrome     His Past Surgical History Is Significant For: Past Surgical History  Procedure Laterality Date  . Mandible fracture surgery    . Hernia repair    . Wrist fracture surgery    . Rotator cuff repair    . Tonsillectomy    . Fracture surgery      jaw  . Vasectomy    . Appendectomy      His Family History Is Significant For: Family History  Problem Relation Age of Onset  . Cancer Father     colon    His Social History Is Significant For: Social History   Social History  . Marital Status: Married    Spouse Name: Kathy  . Number of Children: 2  . Years of Education: 12+   Occupational History  . DISABILITY     due to Parkinson's   Social History Main Topics  . Smoking status: Never Smoker   . Smokeless tobacco: Never Used  . Alcohol Use: 0.0 - 4.2 oz/week    0-7 Cans of beer per week     Comment: occas.  . Drug Use: No  . Sexual Activity: No   Other Topics Concern  . None   Social History Narrative   Patient is married (Kathy)  with 2 children.   Patient is right handed.   Patient has high school education.   Patient drinks  1-2 cups daily.    His Allergies Are:  Allergies  Allergen Reactions  . Ibuprofen Other (See Comments)    Blood in stool.  . Lamictal [Lamotrigine] Other (See Comments)    Blisters in mouth  . Nsaids   . Penicillins   :   His Current Medications Are:  Outpatient Encounter Prescriptions as of 08/25/2015  Medication Sig  . aspirin 81 MG tablet Take 160 mg by mouth daily.  . Biotin 5000 MCG TABS Take by mouth.  . carbidopa-levodopa (SINEMET IR) 25-100 MG tablet Take 1 tablet by mouth 4 (four) times daily.  . citalopram (CELEXA) 10 MG tablet Take 1 tablet (10 mg total) by mouth daily.  . levothyroxine (SYNTHROID, LEVOTHROID) 50 MCG tablet Take 1 tablet (50 mcg total) by mouth daily.  . levothyroxine (SYNTHROID, LEVOTHROID) 75 MCG tablet   . Multiple Vitamins-Minerals (MULTIVITAMIN WITH MINERALS) tablet Take 1 tablet by mouth daily.  . rasagiline (AZILECT) 1 MG TABS tablet Take 1 tablet (1 mg total) by mouth daily.  . rOPINIRole (REQUIP) 2 MG tablet TAKE 1/2 TABLET BY MOUTH AT 6:30 AM, 1/2 TABLET AT NOON, 1/2 TABLET AT 5:00 PM, AND 1 AND 1/2 TABLETS AT 9:00 PM  . [DISCONTINUED] Fenugreek 610 MG CAPS Take by mouth.  . [DISCONTINUED] fludrocortisone (FLORINEF) 0.1 MG tablet 1/2 pill at 10 AM daily for 2 weeks, then 1 pill daily thereafter.  . [DISCONTINUED] levothyroxine (SYNTHROID, LEVOTHROID) 75 MCG tablet TAKE 1 TABLET(75 MCG) BY MOUTH DAILY  . [DISCONTINUED] rOPINIRole (REQUIP) 2 MG tablet TAKE 1/2 TABLET BY MOUTH AT 6:30AM, 1/2 TABLET BY MOUTH AT NOON, 1/2 TABLET BY MOUTH AT 5PM, AND 1 1/2 TABLETS AT 9PM   Facility-Administered Encounter Medications as of 08/25/2015  Medication  . cyanocobalamin ((VITAMIN B-12)) injection 1,000 mcg  :    Review of Systems:  Out of a complete 14 point review of systems, all are reviewed and negative with the exception of these symptoms as listed below:  Review of Systems  Neurological:       Patient is here for f/u. No new concerns. He stopped taking  the Florinef because he did not feel well on it.  Wife is asking about medicinal marijuana for PD.     Objective:  Neurologic Exam  Physical Exam Physical Examination:   Filed Vitals:   08/25/15 1500  BP: 108/54  Pulse: 70  Resp: 16   General Examination: The patient is a very pleasant 69 y.o. male in no acute distress. He does not have significant lightheadedness upon standing. He is in good spirits today.   HEENT: Normocephalic, atraumatic, pupils are equal, round and reactive to light and accommodation. Extraocular tracking shows mild saccadic breakdown without nystagmus noted. There is no limitation to his gaze. There is mild decrease in eye blink rate. Hearing is intact. Face is symmetric with moderate facial masking and normal facial sensation. There is no lip, neck or jaw tremor. Neck is moderately rigid with intact passive ROM. There are no carotid bruits on auscultation. Oropharynx exam reveals mild mouth dryness. No significant airway crowding is noted. Mallampati is class II. Tongue protrudes centrally and palate elevates symmetrically. He has minimal drooling, unchanged.   Chest: is clear to auscultation without wheezing, rhonchi or crackles noted.  Heart: sounds are regular and normal without murmurs, rubs or gallops noted.   Abdomen: is soft, non-tender and non-distended with normal bowel sounds appreciated on auscultation.  Extremities: There is no pitting edema in the distal lower extremities bilaterally. Pedal pulses are intact.  Skin: is warm and dry with no trophic changes noted.  Musculoskeletal: exam reveals no obvious joint deformities, tenderness or joint swelling or erythema.  Neurologically:  Mental status: The patient is awake and alert, paying good attention. He is able to provide the history. His wife supplements with details. He is oriented to: person, place, time/date, situation, day of week, month of year and year. His memory, attention, language and  knowledge are intact. There is no aphasia, agnosia, apraxia or anomia. There is a mild degree of bradyphrenia. Speech is moderately hypophonic with minimal dysarthria noted. Mood is congruent and affect is normal.  Cranial nerves are as described above under HEENT exam. In addition, shoulder shrug is normal with equal shoulder height noted.  Motor exam: Normal bulk, and strength for age is noted. Tone is mildly rigid with presence of cogwheeling in the right upper extremity. There is overall moderate bradykinesia. There is no drift or rebound. There is no tremor. He has mild to moderate dystonic posturing of his right arm and hand. Romberg is negative. Reflexes are 2+ in the upper extremities and 2+ in the lower extremities. Fine motor skills exam reveals: Finger taps are moderately impaired on the right and mild to moderately impaired on the left. Hand movements are moderately impaired on the right and mild to moderately impaired on the left. RAP (rapid alternating patting) is moderately impaired on the right and moderately impaired on the left. Foot taps are moderately impaired on the right and mild to moderately impaired on the left. Foot agility (in the form of heel stomping) is moderately impaired on the right and mildly impaired on the left.    Cerebellar testing shows no dysmetria or intention tremor on finger to nose testing. There is no truncal or gait ataxia.   Heel to shin is unremarkable b/l.   Sensory exam is intact to light touch in the UEs and LEs bilaterally.  Gait, station and balance: He stands up from the seated position with mild difficulty and needs no assistance. He does no feel lightheaded upon standing. No veering to one side is noted. He is not noted to lean to the side. Posture is mildly stooped. No lightheadedness reported. Stance is narrow-based. He walks with decrease in stride length and pace and decreased arm swing on the right and mild to moderated dystonic posturing with the  right arm and hand, slightly worse. He turns in 3 steps and there is slight insecurity with turn. Balance is mildly impaired.   Assessment and Plan:   In summary, [name] is a very pleasant 69-year old male with a history of right-sided predominant Parkinson's disease of the akinetic-rigid type, complicated by a syncopal spells in September 2014 and again in February 2016, for which he had full workup and has also seen cardiology (last seen by Dr. Hilty in 3/16 with prn FU recommended). He may very well have Parkinson's related autonomic dysregulation which often results in supine hypertension at times and sudden drops in blood pressure and falls and often some injuries. He had a recent fall out of bed and isReminded not to let his parents sleep in the bed with him. He denies any recent lightheadedness or presyncopal symptoms. I started him on Florinef in February 2017 but he has since that stopped the Florinef due to side effects reported including nausea and just feeling bad. He stopped it about 3 months ago. We will continue to monitor his symptoms. I would recommend that he continue with his Azilect generic, Sinemet one pill 4 times a day and Requip at the current dose. This leg symptoms are under control with the additional ropinirole dose at night. Motor-wise, he has remained  fairly stable. We will monitor his memory and mood. Mood wise he has done better with low-dose generic Celexa once daily. He is taking 10 mg at night. he did not need any refills on Sinemet, Azilect or ropinirole. I would like to see him back in 4 months from now, sooner if the need arises. I encouraged the patient and his wife to call for any interim questions or concerns. I answered all their questions today and they were in agreement. I spent 25 min in total face-to-face time with the patient, more 50% of which was spent in counseling and coordination of care, reviewing test results, reviewing medication and reviewing  the diagnosis of PD, NOH, the prognosis and treatment options. 

## 2015-09-03 DIAGNOSIS — H2513 Age-related nuclear cataract, bilateral: Secondary | ICD-10-CM | POA: Diagnosis not present

## 2015-09-03 DIAGNOSIS — H52222 Regular astigmatism, left eye: Secondary | ICD-10-CM | POA: Diagnosis not present

## 2015-09-03 DIAGNOSIS — H524 Presbyopia: Secondary | ICD-10-CM | POA: Diagnosis not present

## 2015-09-03 DIAGNOSIS — H5203 Hypermetropia, bilateral: Secondary | ICD-10-CM | POA: Diagnosis not present

## 2015-09-19 ENCOUNTER — Other Ambulatory Visit: Payer: Self-pay | Admitting: Neurology

## 2015-09-19 DIAGNOSIS — G20 Parkinson's disease: Secondary | ICD-10-CM

## 2015-09-21 ENCOUNTER — Encounter: Payer: Self-pay | Admitting: Emergency Medicine

## 2015-09-21 ENCOUNTER — Encounter: Payer: Self-pay | Admitting: Neurology

## 2015-09-22 ENCOUNTER — Telehealth: Payer: Self-pay | Admitting: Neurology

## 2015-09-22 DIAGNOSIS — G20 Parkinson's disease: Secondary | ICD-10-CM

## 2015-09-22 MED ORDER — SAFINAMIDE MESYLATE 50 MG PO TABS: 50.0000 mg | ORAL_TABLET | Freq: Every day | ORAL | 5 refills | Status: DC

## 2015-09-22 NOTE — Telephone Encounter (Signed)
As per our email conversation, we will start Xadago 50 mg, take 1 pill daily.  We will stop the Azilect.  Rx sent to pharm.  Please call patient to advise him about the dosing. In addition, please advise patient that side effects may include (but are not limited to): insomnia, dyskinesias which are involuntary movements, sleepiness, headache, cataract or clouding of the eye lenses and orthostatic hypotension meaning drop in blood pressure which can cause dizziness or lightheadedness.

## 2015-09-23 NOTE — Telephone Encounter (Signed)
I spoke to [name] and he is aware of information below. He wants to finish his Azilect first since he just picked up a bottle but then will start new medication.

## 2015-09-24 ENCOUNTER — Ambulatory Visit (INDEPENDENT_AMBULATORY_CARE_PROVIDER_SITE_OTHER): Payer: Medicare Other | Admitting: Urgent Care

## 2015-09-24 VITALS — BP 108/62 | HR 63 | Temp 98.2°F | Resp 18 | Ht 68.0 in | Wt 122.7 lb

## 2015-09-24 DIAGNOSIS — E039 Hypothyroidism, unspecified: Secondary | ICD-10-CM | POA: Diagnosis not present

## 2015-09-24 LAB — THYROID PANEL WITH TSH
Free Thyroxine Index: 3.2 (ref 1.4–3.8)
T3 Uptake: 30 % (ref 22–35)
T4, Total: 10.5 ug/dL (ref 4.5–12.0)
TSH: 0.01 m[IU]/L — ABNORMAL LOW (ref 0.40–4.50)

## 2015-09-24 MED ORDER — LEVOTHYROXINE SODIUM 125 MCG PO TABS: 125.0000 ug | ORAL_TABLET | Freq: Every day | ORAL | 3 refills | Status: DC

## 2015-09-24 NOTE — Patient Instructions (Addendum)
Hypothyroidism Hypothyroidism is a disorder of the thyroid. The thyroid is a large gland that is located in the lower front of the neck. The thyroid releases hormones that control how the body works. With hypothyroidism, the thyroid does not make enough of these hormones. CAUSES Causes of hypothyroidism may include:  Viral infections.  Pregnancy.  Your own defense system (immune system) attacking your thyroid.  Certain medicines.  Birth defects.  Past radiation treatments to your head or neck.  Past treatment with radioactive iodine.  Past surgical removal of part or all of your thyroid.  Problems with the gland that is located in the center of your brain (pituitary). SIGNS AND SYMPTOMS Signs and symptoms of hypothyroidism may include:  Feeling as though you have no energy (lethargy).  Inability to tolerate cold.  Weight gain that is not explained by a change in diet or exercise habits.  Dry skin.  Coarse hair.  Menstrual irregularity.  Slowing of thought processes.  Constipation.  Sadness or depression. DIAGNOSIS  Your health care provider may diagnose hypothyroidism with blood tests and ultrasound tests. TREATMENT Hypothyroidism is treated with medicine that replaces the hormones that your body does not make. After you begin treatment, it may take several weeks for symptoms to go away. HOME CARE INSTRUCTIONS   Take medicines only as directed by your health care provider.  If you start taking any new medicines, tell your health care provider.  Keep all follow-up visits as directed by your health care provider. This is important. As your condition improves, your dosage needs may change. You will need to have blood tests regularly so that your health care provider can watch your condition. SEEK MEDICAL CARE IF:  Your symptoms do not get better with treatment.  You are taking thyroid replacement medicine and:  You sweat excessively.  You have tremors.  You  feel anxious.  You lose weight rapidly.  You cannot tolerate heat.  You have emotional swings.  You have diarrhea.  You feel weak. SEEK IMMEDIATE MEDICAL CARE IF:   You develop chest pain.  You develop an irregular heartbeat.  You develop a rapid heartbeat.   This information is not intended to replace advice given to you by your health care provider. Make sure you discuss any questions you have with your health care provider.   Document Released: 02/06/2005 Document Revised: 02/27/2014 Document Reviewed: 06/24/2013 Elsevier Interactive Patient Education 2016 [employer].     IF you received an x-ray today, you will receive an invoice from [facility]. Please contact [facility] at [phone] with questions or concerns regarding your invoice.   IF you received labwork today, you will receive an invoice from [employer]. Please contact [facility] at [phone] with questions or concerns regarding your invoice.   Our billing staff will not be able to assist you with questions regarding bills from these companies.  You will be contacted with the lab results as soon as they are available. The fastest way to get your results is to activate your My Chart account. Instructions are located on the last page of this paperwork. If you have not heard from [place] regarding the results in 2 weeks, please contact this office.

## 2015-09-24 NOTE — Progress Notes (Signed)
    MRN: [id number] DOB: March 03, 1945  Subjective:   [name] is a 70 y.o. male presenting for follow up on hypothyroidism. Managed well with 125mg  separated out as a 87mcg, 60mcg. Admits longstanding history of constipation, managed well with Miralax PRN. Denies depression, hair loss, weight gain, insomnia, heart racing, irritability.  [name] has a current medication list which includes the following prescription(s): aspirin, biotin, citalopram, levothyroxine, levothyroxine, multivitamin with minerals, ropinirole, safinamide mesylate, and carbidopa-levodopa, and the following Facility-Administered Medications: cyanocobalamin. Also is allergic to ibuprofen; lamictal [lamotrigine]; nsaids; and penicillins.  [name]  has a past medical history of Allergy; Anemia; Depression; Parkinson's disease ([place]); Parkinson's disease ([place]) (06/05/2012); Raynaud's syndrome; and Thyroid disease. Also  has a past surgical history that includes Mandible fracture surgery; Hernia repair; Wrist fracture surgery; Rotator cuff repair; Tonsillectomy; Fracture surgery; Vasectomy; and Appendectomy.  Objective:   Vitals: BP 108/62   Pulse 63   Temp 98.2 F (36.8 C) (Oral)   Resp 18   Ht 5\' 8"  (1.727 m)   Wt 122 lb 11.2 oz (55.7 kg)   SpO2 99%   BMI 18.66 kg/m   Physical Exam  Constitutional: He is oriented to person, place, and time. He appears well-developed and well-nourished.  HENT:  Mouth/Throat: Oropharynx is clear and moist.  Neck: Normal range of motion. Neck supple. No thyromegaly present.  Cardiovascular: Normal rate, regular rhythm and intact distal pulses.  Exam reveals no gallop and no friction rub.   No murmur heard. Pulmonary/Chest: No respiratory distress. He has no wheezes. He has no rales.  Neurological: He is alert and oriented to person, place, and time.  Skin: Skin is warm and dry.   Assessment and Plan :   1. Hypothyroidism, unspecified hypothyroidism type - Stable, refill provided,  follow up in 6 months.  Jaynee Eagles, PA-C [facility] [phone] 09/24/2015 9:09 AM

## 2015-09-25 ENCOUNTER — Encounter: Payer: Self-pay | Admitting: Urgent Care

## 2015-10-01 ENCOUNTER — Encounter: Payer: Self-pay | Admitting: Neurology

## 2015-10-01 ENCOUNTER — Encounter: Payer: Self-pay | Admitting: Urgent Care

## 2015-10-04 ENCOUNTER — Telehealth: Payer: Self-pay | Admitting: Neurology

## 2015-10-04 DIAGNOSIS — G903 Multi-system degeneration of the autonomic nervous system: Secondary | ICD-10-CM

## 2015-10-04 MED ORDER — FLUDROCORTISONE ACETATE 0.1 MG PO TABS: 0.0500 mg | ORAL_TABLET | Freq: Every day | ORAL | 5 refills | Status: DC

## 2015-10-04 NOTE — Telephone Encounter (Signed)
I spoke to patient and he is aware of results and recommendations. Voiced understanding.

## 2015-10-04 NOTE — Telephone Encounter (Signed)
Please call patient and advise him that I have received his email and we can restart him on Florinef or generic fludrocortisone 0.1 mg strength half a pill around 10 AM and monitor symptoms and he can monitor blood pressure around midday maybe a couple of times a week for now. When we tried it in February of this year he reported side effects, he can monitor for side effects and we are going to stay at the lower dose for now. Prescription sent to his pharmacy listed.

## 2015-10-22 ENCOUNTER — Other Ambulatory Visit: Payer: Self-pay | Admitting: Neurology

## 2015-10-22 DIAGNOSIS — G20 Parkinson's disease: Secondary | ICD-10-CM

## 2015-11-10 ENCOUNTER — Ambulatory Visit (INDEPENDENT_AMBULATORY_CARE_PROVIDER_SITE_OTHER): Payer: Medicare Other | Admitting: Emergency Medicine

## 2015-11-10 ENCOUNTER — Encounter: Payer: Self-pay | Admitting: Emergency Medicine

## 2015-11-10 ENCOUNTER — Other Ambulatory Visit: Payer: Self-pay | Admitting: Emergency Medicine

## 2015-11-10 VITALS — BP 150/80 | HR 56 | Temp 97.8°F | Resp 14 | Ht 67.0 in | Wt 119.0 lb

## 2015-11-10 DIAGNOSIS — Z125 Encounter for screening for malignant neoplasm of prostate: Secondary | ICD-10-CM | POA: Diagnosis not present

## 2015-11-10 DIAGNOSIS — E039 Hypothyroidism, unspecified: Secondary | ICD-10-CM

## 2015-11-10 DIAGNOSIS — L989 Disorder of the skin and subcutaneous tissue, unspecified: Secondary | ICD-10-CM | POA: Diagnosis not present

## 2015-11-10 DIAGNOSIS — Z1159 Encounter for screening for other viral diseases: Secondary | ICD-10-CM | POA: Diagnosis not present

## 2015-11-10 DIAGNOSIS — I7300 Raynaud's syndrome without gangrene: Secondary | ICD-10-CM | POA: Diagnosis not present

## 2015-11-10 DIAGNOSIS — R1032 Left lower quadrant pain: Secondary | ICD-10-CM

## 2015-11-10 DIAGNOSIS — B351 Tinea unguium: Secondary | ICD-10-CM

## 2015-11-10 DIAGNOSIS — F329 Major depressive disorder, single episode, unspecified: Secondary | ICD-10-CM | POA: Diagnosis not present

## 2015-11-10 DIAGNOSIS — Z23 Encounter for immunization: Secondary | ICD-10-CM

## 2015-11-10 DIAGNOSIS — Z0000 Encounter for general adult medical examination without abnormal findings: Secondary | ICD-10-CM

## 2015-11-10 DIAGNOSIS — F32A Depression, unspecified: Secondary | ICD-10-CM

## 2015-11-10 LAB — POCT CBC
Granulocyte percent: 66.1 %G (ref 37–80)
HEMATOCRIT: 37.1 % — AB (ref 43.5–53.7)
Hemoglobin: 13 g/dL — AB (ref 14.1–18.1)
Lymph, poc: 1.6 (ref 0.6–3.4)
MCH, POC: 30.9 pg (ref 27–31.2)
MCHC: 34.9 g/dL (ref 31.8–35.4)
MCV: 88.6 fL (ref 80–97)
MID (cbc): 0.2 (ref 0–0.9)
MPV: 7.7 fL (ref 0–99.8)
POC GRANULOCYTE: 3.6 (ref 2–6.9)
POC LYMPH PERCENT: 29.9 %L (ref 10–50)
POC MID %: 4 %M (ref 0–12)
Platelet Count, POC: 217 10*3/uL (ref 142–424)
RBC: 4.19 M/uL — AB (ref 4.69–6.13)
RDW, POC: 14.7 %
WBC: 5.5 10*3/uL (ref 4.6–10.2)

## 2015-11-10 LAB — COMPLETE METABOLIC PANEL WITH GFR
ALT: 29 U/L (ref 9–46)
AST: 48 U/L — ABNORMAL HIGH (ref 10–35)
Albumin: 4.5 g/dL (ref 3.6–5.1)
Alkaline Phosphatase: 101 U/L (ref 40–115)
BUN: 15 mg/dL (ref 7–25)
CALCIUM: 9.7 mg/dL (ref 8.6–10.3)
CHLORIDE: 103 mmol/L (ref 98–110)
CO2: 29 mmol/L (ref 20–31)
CREATININE: 0.9 mg/dL (ref 0.70–1.25)
GFR, EST NON AFRICAN AMERICAN: 88 mL/min (ref >=60)
Glucose, Bld: 73 mg/dL (ref 65–99)
POTASSIUM: 4.2 mmol/L (ref 3.5–5.3)
SODIUM: 141 mmol/L (ref 135–146)
Total Bilirubin: 0.6 mg/dL (ref 0.2–1.2)
Total Protein: 7.4 g/dL (ref 6.1–8.1)

## 2015-11-10 LAB — POCT URINALYSIS DIP (MANUAL ENTRY)
Bilirubin, UA: NEGATIVE
Blood, UA: NEGATIVE
Glucose, UA: NEGATIVE
LEUKOCYTES UA: NEGATIVE
NITRITE UA: NEGATIVE
PROTEIN UA: NEGATIVE
Spec Grav, UA: 1.02
UROBILINOGEN UA: 0.2
pH, UA: 6

## 2015-11-10 LAB — PSA: PSA: 2.2 ng/mL (ref <=4.0)

## 2015-11-10 LAB — TSH: TSH: 0.02 m[IU]/L — AB (ref 0.40–4.50)

## 2015-11-10 MED ORDER — TERBINAFINE HCL 250 MG PO TABS: 250.0000 mg | ORAL_TABLET | Freq: Every day | ORAL | 2 refills | Status: DC

## 2015-11-10 NOTE — Progress Notes (Addendum)
By signing my name below, I, [name], attest that this documentation has been prepared under the direction and in the presence of Travis Jordan, MD. Electronically Signed: Judithe Foster, ER Scribe. 11/10/2015. 9:09 AM.  Chief Complaint:  Chief Complaint  Patient presents with  . Annual Exam    Pains in groin area, fatigue, constipation  unable to obtain oxygen level  . Nail Problem    Left Great Toe    HPI: [name] is a 70 y.o. male with a past PMHx of raynauds and parkinsons disease who reports to [facility] today for an annual exam. Dr. Cristina Foster  is his gastroenterologist. He started xadago medication three weeks ago. He has not had any falls since his last visit. He walks six miles per day. He states he has noticed that his back seems to be chronically twisting, as though he is developing scoliosis. He has had one mole taken off in the past by the dermatologist due to cancer risk.    Past Medical History:  Diagnosis Date  . Allergy   . Anemia   . Depression   . Parkinson's disease ([place])   . Parkinson's disease ([place]) 06/05/2012  . Raynaud's syndrome   . Thyroid disease    Past Surgical History:  Procedure Laterality Date  . APPENDECTOMY    . FRACTURE SURGERY     jaw  . HERNIA REPAIR    . MANDIBLE FRACTURE SURGERY    . ROTATOR CUFF REPAIR    . TONSILLECTOMY    . VASECTOMY    . WRIST FRACTURE SURGERY     Social History   Social History  . Marital status: Married    Spouse name: [name]  . Number of children: 2  . Years of education: 12+   Occupational History  . DISABILITY Disability    due to Parkinson's   Social History Main Topics  . Smoking status: Never Smoker  . Smokeless tobacco: Never Used  . Alcohol use 0.0 - 4.2 oz/week     Comment: occas.  . Drug use: No  . Sexual activity: No   Other Topics Concern  . None   Social History Narrative   Patient is married [name])  with 2 children.   Patient is right handed.   Patient has high  school education.   Patient drinks 1-2 cups daily.   Family History  Problem Relation Age of Onset  . Cancer Father     colon   Allergies  Allergen Reactions  . Ibuprofen Other (See Comments)    Blood in stool.  . Lamictal [Lamotrigine] Other (See Comments)    Blisters in mouth  . Naproxen   . Nsaids   . Penicillins    Prior to Admission medications   Medication Sig Start Date End Date Taking? Authorizing Provider  aspirin 81 MG tablet Take 160 mg by mouth daily.    Historical Provider, MD  Biotin 5000 MCG TABS Take by mouth.    Historical Provider, MD  carbidopa-levodopa (SINEMET IR) 25-100 MG tablet Take 1 tablet by mouth 4 (four) times daily. 04/19/15   Star Age, MD  citalopram (CELEXA) 10 MG tablet Take 1 tablet (10 mg total) by mouth daily. 04/19/15   Star Age, MD  fludrocortisone (FLORINEF) 0.1 MG tablet Take 0.5 tablets (0.05 mg total) by mouth daily. Take late morning around 10 AM 10/04/15   Star Age, MD  levothyroxine (SYNTHROID, LEVOTHROID) 125 MCG tablet Take 1 tablet (125 mcg total)  by mouth daily. 09/24/15   Jaynee Eagles, PA-C  Multiple Vitamins-Minerals (MULTIVITAMIN WITH MINERALS) tablet Take 1 tablet by mouth daily.    Historical Provider, MD  rasagiline (AZILECT) 1 MG TABS tablet TAKE 1 TABLET(1 MG) BY MOUTH DAILY 10/22/15   Star Age, MD  rOPINIRole (REQUIP) 2 MG tablet TAKE 1/2 TABLET BY MOUTH AT 6:30 AM, 1/2 TABLET AT NOON, 1/2 TABLET AT 5:00 PM, AND 1 AND 1/2 TABLETS AT 9:00 PM 05/24/15   Star Age, MD  Safinamide Mesylate (XADAGO) 50 MG TABS Take 50 mg by mouth daily. 09/22/15   Star Age, MD     ROS: The patient denies fevers, chills, night sweats, unintentional weight loss, chest pain, palpitations, wheezing, dyspnea on exertion, nausea, vomiting, abdominal pain, dysuria, hematuria, melena, numbness, weakness, or tingling.   All other systems have been reviewed and were otherwise negative with the exception of those mentioned in the HPI and as above.     PHYSICAL EXAM: Vitals:   11/10/15 0831  BP: (!) 150/80  Foster: (!) 56  Resp: 14  Temp: 97.8 F (36.6 C)   Body mass index is 18.64 kg/m.   General: Alert, no acute distress HEENT:  Normocephalic, atraumatic, oropharynx patent. Eye: [name] Cardiovascular:  Regular rate and rhythm, no rubs murmurs or gallops.  No Carotid bruits, radial Foster intact. No pedal edema.  Respiratory: Clear to auscultation bilaterally.  No wheezes, rales, or rhonchi.  No cyanosis, no use of accessory musculature Abdominal: No organomegaly, abdomen is soft and non-tender, positive bowel sounds.  No masses. Musculoskeletal: Gait intact. No edema, tenderness. Hands and feet are cold and blue with normal radial and dorsalis pedis pulses.  Skin: No rashes. 21mm pigmented mole right upper lateral chest. Nail deformity, left great toe.  Neurologic: Facial musculature symmetric. Psychiatric: Patient acts appropriately throughout our interaction. Lymphatic: No cervical or submandibular lymphadenopathy Genitorectal: 1/1cm left epididymal cyst. Left greater than right lobe of prostate, but no tenderness.    LABS: Results for orders placed or performed in visit on 11/10/15  POCT CBC  Result Value Ref Range   WBC 5.5 4.6 - 10.2 K/uL   Lymph, poc 1.6 0.6 - 3.4   POC LYMPH PERCENT 29.9 10 - 50 %L   MID (cbc) 0.2 0 - 0.9   POC MID % 4.0 0 - 12 %M   POC Granulocyte 3.6 2 - 6.9   Granulocyte percent 66.1 37 - 80 %G   RBC 4.19 (A) 4.69 - 6.13 M/uL   Hemoglobin 13.0 (A) 14.1 - 18.1 g/dL   HCT, POC 37.1 (A) 43.5 - 53.7 %   MCV 88.6 80 - 97 fL   MCH, POC 30.9 27 - 31.2 pg   MCHC 34.9 31.8 - 35.4 g/dL   RDW, POC 14.7 %   Platelet Count, POC 217 142 - 424 K/uL   MPV 7.7 0 - 99.8 fL     EKG/XRAY:   Primary read interpreted by Dr. Everlene Farrier at [facility].   ASSESSMENT/PLAN: Routine labs were done. He will bring back a urine specimen. He overall is doing well with his Parkinson treatment. Referral made for an  ultrasound of scrotum do it to a cystic area above the left testicle. Referral made to Dr. Tonia Brooms to evaluate a lesion right anterior chest. Flu shot given.I personally performed the services described in this documentation, which was scribed in my presence. The recorded information has been reviewed and is accurate.   Gross sideeffects, risk and benefits, and alternatives  of medications d/w patient. Patient is aware that all medications have potential sideeffects and we are unable to predict every sideeffect or drug-drug interaction that may occur.  Arlyss Queen MD 11/10/2015 8:49 AM

## 2015-11-10 NOTE — Patient Instructions (Signed)
     IF you received an x-ray today, you will receive an invoice from [facility]. Please contact [facility] at [phone] with questions or concerns regarding your invoice.   IF you received labwork today, you will receive an invoice from [facility]. Please contact [facility] at [phone] with questions or concerns regarding your invoice.   Our billing staff will not be able to assist you with questions regarding bills from these companies.  You will be contacted with the lab results as soon as they are available. The fastest way to get your results is to activate your My Chart account. Instructions are located on the last page of this paperwork. If you have not heard from us regarding the results in 2 weeks, please contact this office.      

## 2015-11-11 ENCOUNTER — Other Ambulatory Visit: Payer: Self-pay | Admitting: Emergency Medicine

## 2015-11-11 DIAGNOSIS — E039 Hypothyroidism, unspecified: Secondary | ICD-10-CM

## 2015-11-11 LAB — HEPATITIS C ANTIBODY: HCV AB: NEGATIVE

## 2015-11-11 MED ORDER — LEVOTHYROXINE SODIUM 112 MCG PO TABS: ORAL_TABLET | ORAL | 3 refills | Status: DC

## 2015-11-17 ENCOUNTER — Telehealth: Payer: Self-pay

## 2015-11-17 NOTE — Telephone Encounter (Signed)
[facility] called asking for [place] to add order for [place] art doppler and to change the diagnosis to match the exam. Once added they will schedule. Thanks!

## 2015-11-22 NOTE — Telephone Encounter (Signed)
Please see last ov note and original [place] order. I was unable to get a hold of [place] department to get details of why this order was changed and what diagnosis changed the test type.

## 2015-11-23 DIAGNOSIS — D1800 Hemangioma unspecified site: Secondary | ICD-10-CM | POA: Diagnosis not present

## 2015-11-23 DIAGNOSIS — D485 Neoplasm of uncertain behavior of skin: Secondary | ICD-10-CM | POA: Diagnosis not present

## 2015-11-23 DIAGNOSIS — L821 Other seborrheic keratosis: Secondary | ICD-10-CM | POA: Diagnosis not present

## 2015-11-23 DIAGNOSIS — Z23 Encounter for immunization: Secondary | ICD-10-CM | POA: Diagnosis not present

## 2015-11-23 NOTE — Telephone Encounter (Signed)
Please call [facility] [place] department again. Based on Dr. Perfecto Kingdom notes, the patient has a mass in the LEFT scrotum, suspected epididymal cyst. I see the order for the US scrotum, but I'm not sure what the problem is.

## 2015-11-24 DIAGNOSIS — L738 Other specified follicular disorders: Secondary | ICD-10-CM | POA: Diagnosis not present

## 2015-11-24 DIAGNOSIS — D225 Melanocytic nevi of trunk: Secondary | ICD-10-CM | POA: Diagnosis not present

## 2015-12-02 ENCOUNTER — Encounter: Payer: Self-pay | Admitting: Family Medicine

## 2015-12-02 ENCOUNTER — Ambulatory Visit (INDEPENDENT_AMBULATORY_CARE_PROVIDER_SITE_OTHER): Payer: Medicare Other | Admitting: Family Medicine

## 2015-12-02 VITALS — BP 130/76 | HR 71 | Temp 97.9°F | Ht 67.0 in | Wt 124.1 lb

## 2015-12-02 DIAGNOSIS — R5382 Chronic fatigue, unspecified: Secondary | ICD-10-CM | POA: Diagnosis not present

## 2015-12-02 DIAGNOSIS — F3341 Major depressive disorder, recurrent, in partial remission: Secondary | ICD-10-CM | POA: Diagnosis not present

## 2015-12-02 DIAGNOSIS — R7401 Elevation of levels of liver transaminase levels: Secondary | ICD-10-CM

## 2015-12-02 DIAGNOSIS — R414 Neurologic neglect syndrome: Secondary | ICD-10-CM

## 2015-12-02 DIAGNOSIS — G20 Parkinson's disease: Secondary | ICD-10-CM

## 2015-12-02 DIAGNOSIS — E039 Hypothyroidism, unspecified: Secondary | ICD-10-CM | POA: Diagnosis not present

## 2015-12-02 DIAGNOSIS — R740 Nonspecific elevation of levels of transaminase and lactic acid dehydrogenase [LDH]: Secondary | ICD-10-CM | POA: Diagnosis not present

## 2015-12-02 DIAGNOSIS — K5900 Constipation, unspecified: Secondary | ICD-10-CM

## 2015-12-02 LAB — T4, FREE: FREE T4: 1.18 ng/dL (ref 0.60–1.60)

## 2015-12-02 LAB — TSH: TSH: 0.23 u[IU]/mL — ABNORMAL LOW (ref 0.35–4.50)

## 2015-12-02 NOTE — Progress Notes (Signed)
Pre visit review using our clinic review tool, if applicable. No additional management support is needed unless otherwise documented below in the visit note. 

## 2015-12-02 NOTE — Patient Instructions (Addendum)
A few things to remember from today's visit:   Primary hypothyroidism - Plan: TSH, T4, free  Depression, major, recurrent, in partial remission (HCC)  Chronic fatigue  Long-term use of high-risk medication  Constipation, unspecified constipation type  It is a common symptom associated with multiple factors: psychologic,medications, systemic illness, sleep disorders,infections, and unknown causes. You have labs done to evaluate for common causes as thyroid disease,anemia,diabetes, or abnormalities in calcium,potassium,or sodium. Regular physical activity as tolerated and a healthy diet is usually might help and usually recommended for chronic fatigue.  Fall precautions Continue miralax and Bisacodyl 5 mg daily   Please be sure medication list is accurate. If a new problem present, please set up appointment sooner than planned today.

## 2015-12-02 NOTE — Progress Notes (Signed)
HPI:   Mr.Zed RAEES ATALLAH is a 69 y.o. male, who is here today to establish care with me.  Former PCP: Dr Elodia [place] Last preventive routine visit: 10/2015  Concerns today: fatigue and abnormal gait.  Hx of parkinson disease and RLS , he follows with neurologists, Dr Rexene Alberts.    Fatigue , which he has had for a while. He denies falling asleep while driving or while waiting in the waiting room, falls asleep while watching TV some times.  Hx of OSA, did not tolerate CPAP.   Hx of depression, he is on Celexa 10 mg daily, which he feels is helping. + Insomnia, denies suicidal thoughts.   Hx of hypothyroidism, also TSH was abnormal and treatment was adjusted 2-3 weeks ago. 11/10/15 TSH 0.01.   He is also c/o right side of mid/lower back "twisting", noted about 1-2 years, attributed to scoliosis. According to pt, he has mentioned it to his neurologists before.  Hx of intermittent lower back pain, muscle spasm/leg cramps for a while. + Falls in the past few months, last one about 2 months ago; fell from bed.    He lives with wife. He drives, has a cane that he uses intermittently, intermittent urinary incontinence. Other ADL's independent, IADL's independent.  He follows a healthful diet. He walks 35 min daily.   He also is c/o constipation , he thinks it is caused by Elonda Husky, states that  just started Miralax.  He is on Requip and Sinemet for parkinson.  Reporting colonoscopy done, I do not see report, follows with Dr Cristina Gong.  Denies abdominal pain, nausea, vomiting,blood in stool or melena.  Hx of syncope, attributed to episodes of orthostatic hypotension, currently he is on Florinef 0.5 mg 1/2 tab daily. Echo done 03/2014 LVEF  65% to 70% and grade 2 diastolic dysfunction. He denies orthopnea or PND.  He is currently on Lamisil 250 mg oral, 4th week. 11/10/15 elevated AST. He has hallux onychomycosis, has been treated before but re-occurring.    Review of Systems    Constitutional: Positive for fatigue. Negative for activity change, appetite change, fever and unexpected weight change.  HENT: Negative for nosebleeds, sore throat, trouble swallowing and voice change.   Eyes: Negative for redness and visual disturbance.  Respiratory: Negative for cough, shortness of breath and wheezing.   Cardiovascular: Negative for chest pain, palpitations and leg swelling.  Gastrointestinal: Positive for constipation. Negative for abdominal pain, blood in stool, nausea and vomiting.       No changes in bowel habits.   Endocrine: Negative for cold intolerance and heat intolerance.  Genitourinary: Negative for decreased urine volume, dysuria and hematuria.  Musculoskeletal: Positive for back pain and gait problem.  Skin: Negative for color change and rash.  Neurological: Negative for weakness, numbness and headaches.  Psychiatric/Behavioral: Positive for sleep disturbance. Negative for confusion and hallucinations. The patient is nervous/anxious.       Current Outpatient Prescriptions on File Prior to Visit  Medication Sig Dispense Refill  . aspirin 81 MG tablet Take 160 mg by mouth daily.    . Biotin 5000 MCG TABS Take by mouth.    . carbidopa-levodopa (SINEMET IR) 25-100 MG tablet Take 1 tablet by mouth 4 (four) times daily. 360 tablet 3  . citalopram (CELEXA) 10 MG tablet Take 1 tablet (10 mg total) by mouth daily. 90 tablet 3  . fludrocortisone (FLORINEF) 0.1 MG tablet Take 0.5 tablets (0.05 mg total) by mouth daily. Take late morning around  10 AM 30 tablet 5  . levothyroxine (SYNTHROID, LEVOTHROID) 112 MCG tablet Take 1 tablet daily 90 tablet 3  . Multiple Vitamins-Minerals (MULTIVITAMIN WITH MINERALS) tablet Take 1 tablet by mouth daily.    Marland Kitchen rOPINIRole (REQUIP) 2 MG tablet TAKE 1/2 TABLET BY MOUTH AT 6:30 AM, 1/2 TABLET AT NOON, 1/2 TABLET AT 5:00 PM, AND 1 AND 1/2 TABLETS AT 9:00 PM 270 tablet 3  . Safinamide Mesylate (XADAGO) 50 MG TABS Take 50 mg by mouth  daily. 30 tablet 5  . terbinafine (LAMISIL) 250 MG tablet Take 1 tablet (250 mg total) by mouth daily. 30 tablet 2   Current Facility-Administered Medications on File Prior to Visit  Medication Dose Route Frequency Provider Last Rate Last Dose  . cyanocobalamin ((VITAMIN B-12)) injection 1,000 mcg  1,000 mcg Intramuscular Q30 days Orma Flaming, MD   1,000 mcg at 07/28/14 1344     Past Medical History:  Diagnosis Date  . Allergy   . Anemia   . Depression   . Parkinson's disease ([place])   . Parkinson's disease ([place]) 06/05/2012  . Raynaud's syndrome   . Thyroid disease    Allergies  Allergen Reactions  . Ibuprofen Other (See Comments)    Blood in stool.  . Lamictal [Lamotrigine] Other (See Comments)    Blisters in mouth  . Naproxen   . Nsaids   . Penicillins     Family History  Problem Relation Age of Onset  . Cancer Father     colon    Social History   Social History  . Marital status: Married    Spouse name: [name]  . Number of children: 2  . Years of education: 12+   Occupational History  . DISABILITY Disability    due to Parkinson's   Social History Main Topics  . Smoking status: Never Smoker  . Smokeless tobacco: Never Used  . Alcohol use 0.0 - 4.2 oz/week     Comment: occas.  . Drug use: No  . Sexual activity: No   Other Topics Concern  . None   Social History Narrative   Patient is married [name])  with 2 children.   Patient is right handed.   Patient has high school education.   Patient drinks 1-2 cups daily.    Vitals:   12/02/15 1249  BP: 130/76  Pulse: 71  Temp: 97.9 F (36.6 C)    Body mass index is 19.44 kg/m.     Physical Exam  Constitutional: He is oriented to person, place, and time. He appears well-developed and well-nourished. No distress.  HENT:  Head: Atraumatic.  Mouth/Throat: Oropharynx is clear and moist and mucous membranes are normal.  Eyes: Conjunctivae and EOM are normal. Pupils are equal, round, and reactive to  light.  Neck: No JVD present. No thyroid mass and no thyromegaly present.  Cardiovascular: Normal rate and regular rhythm.   No murmur heard. Pulses:      Dorsalis pedis pulses are 2+ on the right side, and 2+ on the left side.  Respiratory: Effort normal and breath sounds normal. No respiratory distress.  GI: Soft. He exhibits no mass. There is no hepatomegaly. There is no tenderness.  Musculoskeletal: He exhibits no edema.  Neurological: He is alert and oriented to person, place, and time. He has normal strength.  Stable gait, no assisted today. Dystonic posture right UE, bradykinesia , mild dysarthria.  Skin: Skin is warm. No erythema.  Psychiatric: He has a normal mood and affect. Cognition  and memory are normal.  Well groomed, good eye contact.      ASSESSMENT AND PLAN:     Bernice was seen today for establish care.  Diagnoses and all orders for this visit:    Chronic fatigue  We discussed possible causes of fatigue, including some of his chronic medical problems as well as medications. I would like to order Vit D but his current insurance does not cover it, I tried different diagnosis, including high risk medication but he will nee to sign a waver. We will continue monitoring.  Constipation, unspecified constipation type  Continue Miralax and can add Bisacodyl 5 mg daily as needed. Some of his medications can cause or aggravate constipation: Sinemet and Requip mainly. Adequate fiber and fluid intake.   Elevated SGOT (AST)  Mild. LFT's to be repeated with next labs, 12/21/15. We discussed some side effects of oral antimycotic medications and prognosis of toe fungal infections, usually recurrent despite of treatment. He would like to complete treatment.  Primary hypothyroidism  No changes in current management. Labs in a couple weeks, 12/21/15.   -     TSH; Future -     T4, free; Future   Depression, major, recurrent, in partial remission ([place])  Reported  as stable. No changes in current management. F/U in 6 months.   Hemi-akinesia  Gait complaints are most likely related to Parkinson's disease. Seems to be chronic, continue following with Dr Rexene Alberts. Fall precautions discussed. Encouraged to use his cane.     Jamond Neels G. [place], MD  [facility] [place] office.

## 2015-12-03 NOTE — Telephone Encounter (Signed)
Let's touch base with the patient and/or [facility] and/or [facility].

## 2015-12-03 NOTE — Telephone Encounter (Signed)
Chelle, checking on this note.  Saw pt established care with [facility] yesterday. Want [place] to continue to try to schedule U/S?

## 2015-12-03 NOTE — Telephone Encounter (Signed)
Spoke with patient and he said he no longer wants to have the US done.  He said that he will just follow up with his new practice.  I apologized to him for any inconvenience that was caused to him.  He said that it was no inconvenience; he just decided to change practices since Dr. Everlene Farrier had left.

## 2015-12-07 ENCOUNTER — Other Ambulatory Visit: Payer: Self-pay

## 2015-12-07 DIAGNOSIS — S92301A Fracture of unspecified metatarsal bone(s), right foot, initial encounter for closed fracture: Secondary | ICD-10-CM | POA: Diagnosis not present

## 2015-12-07 DIAGNOSIS — M79671 Pain in right foot: Secondary | ICD-10-CM | POA: Diagnosis not present

## 2015-12-07 DIAGNOSIS — S92351A Displaced fracture of fifth metatarsal bone, right foot, initial encounter for closed fracture: Secondary | ICD-10-CM | POA: Diagnosis not present

## 2015-12-07 DIAGNOSIS — G20 Parkinson's disease: Secondary | ICD-10-CM | POA: Diagnosis not present

## 2015-12-07 DIAGNOSIS — R7989 Other specified abnormal findings of blood chemistry: Secondary | ICD-10-CM

## 2015-12-07 DIAGNOSIS — B351 Tinea unguium: Secondary | ICD-10-CM

## 2015-12-09 DIAGNOSIS — S92351A Displaced fracture of fifth metatarsal bone, right foot, initial encounter for closed fracture: Secondary | ICD-10-CM | POA: Diagnosis not present

## 2015-12-15 ENCOUNTER — Emergency Department (HOSPITAL_COMMUNITY): Payer: Medicare Other

## 2015-12-15 ENCOUNTER — Inpatient Hospital Stay (HOSPITAL_COMMUNITY)
Admission: EM | Admit: 2015-12-15 | Discharge: 2015-12-16 | DRG: 206 | Disposition: A | Discharge status: Home / Self Care | Payer: Medicare Other | Attending: Internal Medicine | Admitting: Internal Medicine

## 2015-12-15 ENCOUNTER — Encounter (HOSPITAL_COMMUNITY): Payer: Self-pay

## 2015-12-15 DIAGNOSIS — G20 Parkinson's disease: Secondary | ICD-10-CM | POA: Diagnosis present

## 2015-12-15 DIAGNOSIS — Z886 Allergy status to analgesic agent status: Secondary | ICD-10-CM | POA: Diagnosis not present

## 2015-12-15 DIAGNOSIS — S92901D Unspecified fracture of right foot, subsequent encounter for fracture with routine healing: Secondary | ICD-10-CM

## 2015-12-15 DIAGNOSIS — R05 Cough: Secondary | ICD-10-CM

## 2015-12-15 DIAGNOSIS — R3915 Urgency of urination: Secondary | ICD-10-CM | POA: Diagnosis present

## 2015-12-15 DIAGNOSIS — R350 Frequency of micturition: Secondary | ICD-10-CM | POA: Diagnosis present

## 2015-12-15 DIAGNOSIS — R0902 Hypoxemia: Secondary | ICD-10-CM | POA: Diagnosis not present

## 2015-12-15 DIAGNOSIS — I951 Orthostatic hypotension: Secondary | ICD-10-CM | POA: Diagnosis not present

## 2015-12-15 DIAGNOSIS — G2581 Restless legs syndrome: Secondary | ICD-10-CM | POA: Diagnosis present

## 2015-12-15 DIAGNOSIS — Z888 Allergy status to other drugs, medicaments and biological substances status: Secondary | ICD-10-CM | POA: Diagnosis not present

## 2015-12-15 DIAGNOSIS — Z7982 Long term (current) use of aspirin: Secondary | ICD-10-CM

## 2015-12-15 DIAGNOSIS — F3341 Major depressive disorder, recurrent, in partial remission: Secondary | ICD-10-CM | POA: Diagnosis present

## 2015-12-15 DIAGNOSIS — I10 Essential (primary) hypertension: Secondary | ICD-10-CM | POA: Diagnosis present

## 2015-12-15 DIAGNOSIS — E039 Hypothyroidism, unspecified: Secondary | ICD-10-CM | POA: Diagnosis present

## 2015-12-15 DIAGNOSIS — R059 Cough, unspecified: Secondary | ICD-10-CM

## 2015-12-15 DIAGNOSIS — Z79899 Other long term (current) drug therapy: Secondary | ICD-10-CM | POA: Diagnosis not present

## 2015-12-15 DIAGNOSIS — J9811 Atelectasis: Principal | ICD-10-CM | POA: Diagnosis present

## 2015-12-15 DIAGNOSIS — J189 Pneumonia, unspecified organism: Secondary | ICD-10-CM | POA: Diagnosis not present

## 2015-12-15 DIAGNOSIS — Z880 Allergy status to penicillin: Secondary | ICD-10-CM | POA: Diagnosis not present

## 2015-12-15 DIAGNOSIS — G4489 Other headache syndrome: Secondary | ICD-10-CM | POA: Diagnosis not present

## 2015-12-15 LAB — CBC WITH DIFFERENTIAL/PLATELET
BASOS ABS: 0 10*3/uL (ref 0.0–0.1)
BASOS PCT: 0 %
EOS PCT: 0 %
Eosinophils Absolute: 0 10*3/uL (ref 0.0–0.7)
HEMATOCRIT: 37.2 % — AB (ref 39.0–52.0)
Hemoglobin: 12.3 g/dL — ABNORMAL LOW (ref 13.0–17.0)
LYMPHS PCT: 5 %
Lymphs Abs: 0.6 10*3/uL — ABNORMAL LOW (ref 0.7–4.0)
MCH: 29.7 pg (ref 26.0–34.0)
MCHC: 33.1 g/dL (ref 30.0–36.0)
MCV: 89.9 fL (ref 78.0–100.0)
MONO ABS: 0.5 10*3/uL (ref 0.1–1.0)
MONOS PCT: 4 %
NEUTROS ABS: 10.9 10*3/uL — AB (ref 1.7–7.7)
Neutrophils Relative %: 91 %
PLATELETS: 179 10*3/uL (ref 150–400)
RBC: 4.14 MIL/uL — ABNORMAL LOW (ref 4.22–5.81)
RDW: 14 % (ref 11.5–15.5)
WBC: 12 10*3/uL — ABNORMAL HIGH (ref 4.0–10.5)

## 2015-12-15 LAB — COMPREHENSIVE METABOLIC PANEL
ALBUMIN: 4 g/dL (ref 3.5–5.0)
ALT: 64 U/L — AB (ref 17–63)
AST: 117 U/L — AB (ref 15–41)
Alkaline Phosphatase: 123 U/L (ref 38–126)
Anion gap: 10 (ref 5–15)
BILIRUBIN TOTAL: 0.8 mg/dL (ref 0.3–1.2)
BUN: 21 mg/dL — AB (ref 6–20)
CO2: 25 mmol/L (ref 22–32)
CREATININE: 1.02 mg/dL (ref 0.61–1.24)
Calcium: 8.8 mg/dL — ABNORMAL LOW (ref 8.9–10.3)
Chloride: 101 mmol/L (ref 101–111)
GFR calc Af Amer: >60 mL/min (ref >60)
GLUCOSE: 120 mg/dL — AB (ref 65–99)
Potassium: 3.6 mmol/L (ref 3.5–5.1)
Sodium: 136 mmol/L (ref 135–145)
TOTAL PROTEIN: 7.4 g/dL (ref 6.5–8.1)

## 2015-12-15 LAB — URINALYSIS, ROUTINE W REFLEX MICROSCOPIC
BILIRUBIN URINE: NEGATIVE
Glucose, UA: NEGATIVE mg/dL
KETONES UR: NEGATIVE mg/dL
LEUKOCYTES UA: NEGATIVE
NITRITE: NEGATIVE
PROTEIN: NEGATIVE mg/dL
Specific Gravity, Urine: 1.013 (ref 1.005–1.030)
pH: 5.5 (ref 5.0–8.0)

## 2015-12-15 LAB — URINE MICROSCOPIC-ADD ON: SQUAMOUS EPITHELIAL / LPF: NONE SEEN

## 2015-12-15 LAB — I-STAT CG4 LACTIC ACID, ED: LACTIC ACID, VENOUS: 1.37 mmol/L (ref 0.5–1.9)

## 2015-12-15 LAB — INFLUENZA PANEL BY PCR (TYPE A & B)
H1N1FLUPCR: NOT DETECTED
INFLAPCR: NEGATIVE
Influenza B By PCR: NEGATIVE

## 2015-12-15 LAB — STREP PNEUMONIAE URINARY ANTIGEN: STREP PNEUMO URINARY ANTIGEN: NEGATIVE

## 2015-12-15 MED ORDER — ROPINIROLE HCL 1 MG PO TABS
1.0000 mg | ORAL_TABLET | Freq: Three times a day (TID) | ORAL | Status: DC
  Administered 2015-12-15 – 2015-12-16 (×3): 1 mg via ORAL
  Filled 2015-12-15 (×5): qty 1

## 2015-12-15 MED ORDER — CITALOPRAM HYDROBROMIDE 20 MG PO TABS
10.0000 mg | ORAL_TABLET | Freq: Every day | ORAL | Status: DC
  Administered 2015-12-15 – 2015-12-16 (×2): 10 mg via ORAL
  Filled 2015-12-15 (×2): qty 1

## 2015-12-15 MED ORDER — SAFINAMIDE MESYLATE 50 MG PO TABS: 50.0000 mg | ORAL_TABLET | Freq: Every day | ORAL | Status: DC

## 2015-12-15 MED ORDER — FLUDROCORTISONE ACETATE 0.1 MG PO TABS
0.0500 mg | ORAL_TABLET | Freq: Every day | ORAL | Status: DC
  Administered 2015-12-16: 0.05 mg via ORAL
  Filled 2015-12-15: qty 0.5

## 2015-12-15 MED ORDER — ENOXAPARIN SODIUM 40 MG/0.4ML ~~LOC~~ SOLN
40.0000 mg | SUBCUTANEOUS | Status: DC
  Administered 2015-12-15: 40 mg via SUBCUTANEOUS
  Filled 2015-12-15: qty 0.4

## 2015-12-15 MED ORDER — SODIUM CHLORIDE 0.9 % IV BOLUS (SEPSIS)
1000.0000 mL | Freq: Once | INTRAVENOUS | Status: AC
  Administered 2015-12-15: 1000 mL via INTRAVENOUS

## 2015-12-15 MED ORDER — SODIUM CHLORIDE 0.9 % IV SOLN: INTRAVENOUS | Status: DC

## 2015-12-15 MED ORDER — OSELTAMIVIR PHOSPHATE 75 MG PO CAPS
75.0000 mg | ORAL_CAPSULE | Freq: Two times a day (BID) | ORAL | Status: DC
  Filled 2015-12-15 (×2): qty 1

## 2015-12-15 MED ORDER — DEXTROSE 5 % IV SOLN
500.0000 mg | INTRAVENOUS | Status: DC
Start: 2015-12-15 — End: 2015-12-16
  Administered 2015-12-15 – 2015-12-16 (×2): 500 mg via INTRAVENOUS
  Filled 2015-12-15 (×2): qty 500

## 2015-12-15 MED ORDER — ASPIRIN EC 81 MG PO TBEC
160.0000 mg | DELAYED_RELEASE_TABLET | Freq: Every day | ORAL | Status: DC
  Administered 2015-12-16: 162 mg via ORAL
  Filled 2015-12-15: qty 2

## 2015-12-15 MED ORDER — LEVOFLOXACIN IN D5W 750 MG/150ML IV SOLN
750.0000 mg | Freq: Once | INTRAVENOUS | Status: DC
  Administered 2015-12-15: 750 mg via INTRAVENOUS
  Filled 2015-12-15: qty 150

## 2015-12-15 MED ORDER — TRAMADOL HCL 50 MG PO TABS
50.0000 mg | ORAL_TABLET | Freq: Four times a day (QID) | ORAL | Status: DC | PRN
  Administered 2015-12-15 – 2015-12-16 (×2): 50 mg via ORAL
  Filled 2015-12-15 (×2): qty 1

## 2015-12-15 MED ORDER — LEVOTHYROXINE SODIUM 112 MCG PO TABS
112.0000 ug | ORAL_TABLET | Freq: Every day | ORAL | Status: DC
  Administered 2015-12-16: 112 ug via ORAL
  Filled 2015-12-15: qty 1

## 2015-12-15 MED ORDER — CHLORHEXIDINE GLUCONATE 0.12 % MT SOLN
15.0000 mL | Freq: Two times a day (BID) | OROMUCOSAL | Status: DC
  Administered 2015-12-15 – 2015-12-16 (×2): 15 mL via OROMUCOSAL
  Filled 2015-12-15 (×2): qty 15

## 2015-12-15 MED ORDER — ADULT MULTIVITAMIN W/MINERALS CH
1.0000 | ORAL_TABLET | Freq: Every day | ORAL | Status: DC
  Administered 2015-12-15: 1 via ORAL
  Filled 2015-12-15 (×2): qty 1

## 2015-12-15 MED ORDER — CARBIDOPA-LEVODOPA 25-100 MG PO TABS
1.0000 | ORAL_TABLET | Freq: Four times a day (QID) | ORAL | Status: DC
  Administered 2015-12-15 – 2015-12-16 (×4): 1 via ORAL
  Filled 2015-12-15 (×6): qty 1

## 2015-12-15 MED ORDER — TERBINAFINE HCL 250 MG PO TABS
250.0000 mg | ORAL_TABLET | Freq: Every day | ORAL | Status: DC
  Administered 2015-12-15 – 2015-12-16 (×2): 250 mg via ORAL
  Filled 2015-12-15 (×2): qty 1

## 2015-12-15 MED ORDER — ROPINIROLE HCL 1 MG PO TABS
3.0000 mg | ORAL_TABLET | Freq: Every day | ORAL | Status: DC
  Administered 2015-12-15: 3 mg via ORAL
  Filled 2015-12-15: qty 3

## 2015-12-15 MED ORDER — VANCOMYCIN HCL IN DEXTROSE 1-5 GM/200ML-% IV SOLN
1000.0000 mg | Freq: Once | INTRAVENOUS | Status: DC
  Administered 2015-12-15: 1000 mg via INTRAVENOUS
  Filled 2015-12-15: qty 200

## 2015-12-15 MED ORDER — SODIUM CHLORIDE 0.9 % IV SOLN
INTRAVENOUS | Status: DC
  Administered 2015-12-15 (×2): via INTRAVENOUS

## 2015-12-15 MED ORDER — ORAL CARE MOUTH RINSE
15.0000 mL | Freq: Two times a day (BID) | OROMUCOSAL | Status: DC
  Administered 2015-12-15 – 2015-12-16 (×2): 15 mL via OROMUCOSAL

## 2015-12-15 MED ORDER — ACETAMINOPHEN 325 MG PO TABS
650.0000 mg | ORAL_TABLET | Freq: Once | ORAL | Status: AC
  Administered 2015-12-15: 650 mg via ORAL
  Filled 2015-12-15: qty 2

## 2015-12-15 MED ORDER — CEFTRIAXONE SODIUM 1 G IJ SOLR
1.0000 g | INTRAMUSCULAR | Status: DC
Start: 2015-12-15 — End: 2015-12-16
  Administered 2015-12-15 – 2015-12-16 (×2): 1 g via INTRAVENOUS
  Filled 2015-12-15 (×2): qty 10

## 2015-12-15 MED ORDER — ONDANSETRON HCL 4 MG/2ML IJ SOLN
4.0000 mg | Freq: Once | INTRAMUSCULAR | Status: AC
  Administered 2015-12-15: 4 mg via INTRAVENOUS
  Filled 2015-12-15: qty 2

## 2015-12-15 NOTE — ED Notes (Signed)
Unable to collect urine as pt has urinated in the bed. Will reattempt to collect.

## 2015-12-15 NOTE — ED Notes (Addendum)
Pt attempted to use urinal but spilled it. Was unable to collect a urine sample

## 2015-12-15 NOTE — ED Notes (Signed)
MD at bedside. 

## 2015-12-15 NOTE — ED Notes (Signed)
Patient transported to X-ray 

## 2015-12-15 NOTE — ED Provider Notes (Signed)
[facility] DEPT Provider Note   CSN: [id number] Arrival date & time: 12/15/15  0901     History   Chief Complaint Chief Complaint  Patient presents with  . Headache  . Nausea  . Hypertension    HPI [name] is a 70 y.o. male.  70 year old male presents with one week of cough and congestion with worsening dizziness along with nausea but no vomiting. No diarrhea noted. Assessment subjective myalgias. [facility] a week ago and was evaluated at an outside facility and did have a broken right foot. Has follow-up with orthopedics for this. Also notes urinary frequency and urgency. Patient versus anorexia. Denies any throat or ear pain. Denies any headache or neck pain or photophobia. Cough productive of green-yellow sputum. No medications taken for this. Nothing makes her symptoms better.      Past Medical History:  Diagnosis Date  . Allergy   . Anemia   . Depression   . Parkinson's disease ([place])   . Parkinson's disease ([place]) 06/05/2012  . Raynaud's syndrome   . Thyroid disease     Patient Active Problem List   Diagnosis Date Noted  . Chronic fatigue 12/02/2015  . Raynaud's phenomenon 04/16/2014  . Insomnia 03/31/2014  . Syncope 11/11/2012  . Autonomic postural hypotension 11/11/2012  . Parkinson's disease ([place]) 06/05/2012  . Restless leg syndrome 11/15/2011  . Primary hypothyroidism 09/11/2011  . Parkinsonism ([place]) 04/02/2011  . Depression, major, recurrent, in partial remission ([place]) 04/02/2011    Past Surgical History:  Procedure Laterality Date  . APPENDECTOMY    . FRACTURE SURGERY     jaw  . HERNIA REPAIR    . MANDIBLE FRACTURE SURGERY    . ROTATOR CUFF REPAIR    . TONSILLECTOMY    . VASECTOMY    . WRIST FRACTURE SURGERY         Home Medications    Prior to Admission medications   Medication Sig Start Date End Date Taking? Authorizing Provider  aspirin 81 MG tablet Take 160 mg by mouth daily.    Historical Provider, MD  Biotin 5000 MCG  TABS Take by mouth.    Historical Provider, MD  carbidopa-levodopa (SINEMET IR) 25-100 MG tablet Take 1 tablet by mouth 4 (four) times daily. 04/19/15   Star Age, MD  citalopram (CELEXA) 10 MG tablet Take 1 tablet (10 mg total) by mouth daily. 04/19/15   Star Age, MD  fludrocortisone (FLORINEF) 0.1 MG tablet Take 0.5 tablets (0.05 mg total) by mouth daily. Take late morning around 10 AM 10/04/15   Star Age, MD  levothyroxine (SYNTHROID, LEVOTHROID) 112 MCG tablet Take 1 tablet daily 11/11/15   Darlyne Russian, MD  Multiple Vitamins-Minerals (MULTIVITAMIN WITH MINERALS) tablet Take 1 tablet by mouth daily.    Historical Provider, MD  rOPINIRole (REQUIP) 2 MG tablet TAKE 1/2 TABLET BY MOUTH AT 6:30 AM, 1/2 TABLET AT NOON, 1/2 TABLET AT 5:00 PM, AND 1 AND 1/2 TABLETS AT 9:00 PM 05/24/15   Star Age, MD  Safinamide Mesylate (XADAGO) 50 MG TABS Take 50 mg by mouth daily. 09/22/15   Star Age, MD  terbinafine (LAMISIL) 250 MG tablet Take 1 tablet (250 mg total) by mouth daily. 11/10/15   Darlyne Russian, MD    Family History Family History  Problem Relation Age of Onset  . Cancer Father     colon    Social History Social History  Substance Use Topics  . Smoking status: Never Smoker  . Smokeless tobacco: Never  Used  . Alcohol use 0.0 - 4.2 oz/week     Comment: occas.     Allergies   Ibuprofen; Lamictal [lamotrigine]; Naproxen; Nsaids; and Penicillins   Review of Systems Review of Systems  All other systems reviewed and are negative.    Physical Exam Updated Vital Signs BP 150/84   Pulse 103   Temp 99.8 F (37.7 C) (Oral)   Resp 24   SpO2 95%   Physical Exam  Constitutional: He is oriented to person, place, and time. He appears well-developed and well-nourished.  Non-toxic appearance. No distress.  HENT:  Head: Normocephalic and atraumatic.  Eyes: Conjunctivae, EOM and lids are normal. Pupils are equal, round, and reactive to light.  Neck: Normal range of motion. Neck  supple. No tracheal deviation present. No thyroid mass present.  Cardiovascular: Normal rate, regular rhythm and normal heart sounds.  Exam reveals no gallop.   No murmur heard. Pulmonary/Chest: Effort normal. No stridor. No respiratory distress. He has decreased breath sounds. He has no wheezes. He has rhonchi. He has no rales.  Abdominal: Soft. Normal appearance and bowel sounds are normal. He exhibits no distension. There is no tenderness. There is no rebound and no CVA tenderness.  Musculoskeletal: Normal range of motion. He exhibits no edema or tenderness.  Neurological: He is alert and oriented to person, place, and time. He has normal strength. No cranial nerve deficit or sensory deficit. GCS eye subscore is 4. GCS verbal subscore is 5. GCS motor subscore is 6.  Skin: Skin is warm and dry. No abrasion and no rash noted.  Psychiatric: He has a normal mood and affect. His speech is normal and behavior is normal.  Nursing note and vitals reviewed.    ED Treatments / Results  Labs (all labs ordered are listed, but only abnormal results are displayed) Labs Reviewed  CULTURE, BLOOD (ROUTINE X 2)  CULTURE, BLOOD (ROUTINE X 2)  URINE CULTURE  COMPREHENSIVE METABOLIC PANEL  CBC WITH DIFFERENTIAL/PLATELET  URINALYSIS, ROUTINE W REFLEX MICROSCOPIC (NOT AT [facility])  I-STAT CG4 LACTIC ACID, ED    EKG  EKG Interpretation None       Radiology No results found.  Procedures Procedures (including critical care time)  Medications Ordered in ED Medications - No data to display   Initial Impression / Assessment and Plan / ED Course  I have reviewed the triage vital signs and the nursing notes.  Pertinent labs & imaging results that were available during my care of the patient were reviewed by me and considered in my medical decision making (see chart for details).  Clinical Course    Patient started on antibiotics for suspected pneumonia. Fever medicated with Tylenol. Discussed with  hospitalist and patient will be admitted  Final Clinical Impressions(s) / ED Diagnoses   Final diagnoses:  None    New Prescriptions New Prescriptions   No medications on file     Lacretia Leigh, MD 12/15/15 1147

## 2015-12-15 NOTE — ED Notes (Signed)
Pt is requesting more drink

## 2015-12-15 NOTE — ED Notes (Signed)
Bed: WA25 Expected date:  Expected time:  Means of arrival:  Comments: ems  

## 2015-12-15 NOTE — Progress Notes (Signed)
Pharmacy Antibiotic Note  [name] is a 70 y.o. male admitted on 12/15/2015 with worsening cough, congestion, dizziness, concern for CAP.  Pharmacy has been consulted for renal adjustment for antibiotic dosing.  SCr 1.02, CrCl ~ 56 ml/min WBC 12 Tm 103.6  Plan:  Ceftriaxone 1g IV q24h  Azithromycin 500mg  IV q24h  Tamiflu 75 mg PO BID  Follow up renal fxn, culture results, and clinical course.      Temp (24hrs), Avg:102.1 F (38.9 C), Min:99.8 F (37.7 C), Max:103.6 F (39.8 C)   Recent Labs Lab 12/15/15 1002 12/15/15 1011  WBC 12.0*  --   CREATININE 1.02  --   LATICACIDVEN  --  1.37    Estimated Creatinine Clearance: 56 mL/min (by C-G formula based on SCr of 1.02 mg/dL).    Allergies  Allergen Reactions  . Ibuprofen Other (See Comments)    Blood in stool.  . Lamictal [Lamotrigine] Other (See Comments)    Blisters in mouth  . Naproxen Other (See Comments)    Bleeding in stool   . Nsaids Other (See Comments)    Bleeding in stool   . Penicillins Rash    .Marland KitchenHas patient had a PCN reaction causing immediate rash, facial/tongue/throat swelling, SOB or lightheadedness with hypotension: No Has patient had a PCN reaction causing severe rash involving mucus membranes or skin necrosis: No Has patient had a PCN reaction that required hospitalization No Has patient had a PCN reaction occurring within the last 10 years: No If all of the above answers are "NO", then may proceed with Cephalosporin use.     Antimicrobials this admission: 10/25 Ceftriaxone >>  10/25 Azithromycin >>  10/25 Oseltamivir >>  Dose adjustments this admission:  Microbiology results: 10/25 BCx:  10/25 UCx:  10/25 Influenza: 10/25 Strep pneumo ur Ag:   Thank you for allowing pharmacy to be a part of this patient's care.  Gretta Arab PharmD, BCPS Pager [phone] 12/15/2015 11:43 AM

## 2015-12-15 NOTE — ED Notes (Signed)
Admitting at bedside 

## 2015-12-15 NOTE — H&P (Addendum)
TRH H&P   Patient Demographics:    [name], is a 70 y.o. male  MRN: [id number]   DOB - [birth year]  Admit Date - 12/15/2015  Outpatient Primary MD for the patient is Betty Martinique, MD  Referring MD/NP/PA: dr Zenia Resides  Patient coming from: Home  Chief Complaint  Patient presents with  . Headache  . Nausea  . Hypertension      HPI:    [name]  is a 70 y.o. male, With past medical history of Parkinson disease, hypothyroidism, restless leg syndrome, autonomic hypotension, resents with multiple complaints including cough, shortness of breath, fever and chills, headache, and generalized body ache, recent reports symptoms has been progressing over one week, started initially as cough, became productive with yellow sputum, and reports menorrhagia, generalized body ache, and headache, reports chills and fever at home as well, patient reports fall a few weeks ago, with right foot fracture, followed here by Dr. Dellis Filbert. -IN ED workup significant for fever 103.6 leukocytosis of 12, tachycardia, normal lactic acid, chest x-ray significant for atelectasis, but no pneumonia, patient received IV Rocephin and azithromycin in ED, hospitalist requested to admit.    Review of systems:    In addition to the HPI above,  Reports fever and chills at home No Headache, No changes with Vision or hearing, No problems swallowing food or Liquids, No Chest pain, complains of Cough with productive sputum and Shortness of Breath, No Abdominal pain, No Nausea or Vommitting, Bowel movements are regular, No Blood in stool or Urine, No dysuria, No new skin rashes or bruises, No new joints pains-aches,  No new weakness, tingling, numbness in any extremity, report generalized weakness and fatigue No recent weight gain or loss, No polyuria, polydypsia or polyphagia, No significant Mental Stressors.  A  full 10 point Review of Systems was done, except as stated above, all other Review of Systems were negative.   With Past History of the following :    Past Medical History:  Diagnosis Date  . Allergy   . Anemia   . Depression   . Parkinson's disease ([place])   . Parkinson's disease ([place]) 06/05/2012  . Raynaud's syndrome   . Thyroid disease       Past Surgical History:  Procedure Laterality Date  . APPENDECTOMY    . FRACTURE SURGERY     jaw  . HERNIA REPAIR    . MANDIBLE FRACTURE SURGERY    . ROTATOR CUFF REPAIR    . TONSILLECTOMY    . VASECTOMY    . WRIST FRACTURE SURGERY        Social History:     Social History  Substance Use Topics  . Smoking status: Never Smoker  . Smokeless tobacco: Never Used  . Alcohol use 0.0 - 4.2 oz/week     Comment: occas.     Lives - At home  Mobility - Independent  Family History :     Family History  Problem Relation Age of Onset  . Cancer Father     colon      Home Medications:   Prior to Admission medications   Medication Sig Start Date End Date Taking? Authorizing Provider  aspirin 81 MG tablet Take 160 mg by mouth daily.   Yes Historical Provider, MD  carbidopa-levodopa (SINEMET IR) 25-100 MG tablet Take 1 tablet by mouth 4 (four) times daily. 04/19/15  Yes Star Age, MD  citalopram (CELEXA) 10 MG tablet Take 1 tablet (10 mg total) by mouth daily. 04/19/15  Yes Star Age, MD  fludrocortisone (FLORINEF) 0.1 MG tablet Take 0.5 tablets (0.05 mg total) by mouth daily. Take late morning around 10 AM 10/04/15  Yes Star Age, MD  levothyroxine (SYNTHROID, LEVOTHROID) 112 MCG tablet Take 1 tablet daily 11/11/15  Yes Darlyne Russian, MD  Multiple Vitamins-Minerals (MULTIVITAMIN WITH MINERALS) tablet Take 1 tablet by mouth daily.   Yes Historical Provider, MD  rOPINIRole (REQUIP) 2 MG tablet TAKE 1/2 TABLET BY MOUTH AT 6:30 AM, 1/2 TABLET AT NOON, 1/2 TABLET AT 5:00 PM, AND 1 AND 1/2 TABLETS AT 9:00 PM 05/24/15  Yes Star Age,  MD  Safinamide Mesylate (XADAGO) 50 MG TABS Take 50 mg by mouth daily. 09/22/15  Yes Star Age, MD  terbinafine (LAMISIL) 250 MG tablet Take 1 tablet (250 mg total) by mouth daily. 11/10/15  Yes Darlyne Russian, MD  traMADol (ULTRAM) 50 MG tablet Take 50 mg by mouth every 6 (six) hours as needed for moderate pain.    Historical Provider, MD     Allergies:     Allergies  Allergen Reactions  . Ibuprofen Other (See Comments)    Blood in stool.  . Lamictal [Lamotrigine] Other (See Comments)    Blisters in mouth  . Naproxen Other (See Comments)    Bleeding in stool   . Nsaids Other (See Comments)    Bleeding in stool   . Penicillins Rash    .Marland KitchenHas patient had a PCN reaction causing immediate rash, facial/tongue/throat swelling, SOB or lightheadedness with hypotension: No Has patient had a PCN reaction causing severe rash involving mucus membranes or skin necrosis: No Has patient had a PCN reaction that required hospitalization No Has patient had a PCN reaction occurring within the last 10 years: No If all of the above answers are "NO", then may proceed with Cephalosporin use.      Physical Exam:   Vitals  Blood pressure 127/86, pulse 97, temperature (!) 103.6 F (39.8 C), temperature source Rectal, resp. rate 15, SpO2 95 %.   1. General Well-developed male lying in bed in NAD,    2. Normal affect and insight, Not Suicidal or Homicidal, Awake Alert, Oriented X 3.  3. No F.N deficits, ALL C.Nerves Intact, Strength 5/5 all 4 extremities, Sensation intact all 4 extremities, Plantars down going.  4. Ears and Eyes appear Normal, Conjunctivae clear, PERRLA. Moist Oral Mucosa.  5. Supple Neck, No JVD, No cervical lymphadenopathy appriciated, No Carotid Bruits.No nuchal rigidity   6. Symmetrical Chest wall movement, Good air movement bilaterally, CTAB.  7. RRR, No Gallops, Rubs or Murmurs, No Parasternal Heave.  8. Positive Bowel Sounds, Abdomen Soft, No tenderness, No organomegaly  appriciated,No rebound -guarding or rigidity.  9.  No Cyanosis, Normal Skin Turgor, No Skin Rash or Bruise.  10. Good muscle tone,  joints appear normal , no effusions, Normal ROM,Right foot boot.  11. No Palpable Lymph  Nodes in Neck or Axillae    Data Review:    CBC  Recent Labs Lab 12/15/15 1002  WBC 12.0*  HGB 12.3*  HCT 37.2*  PLT 179  MCV 89.9  MCH 29.7  MCHC 33.1  RDW 14.0  LYMPHSABS 0.6*  MONOABS 0.5  EOSABS 0.0  BASOSABS 0.0   ------------------------------------------------------------------------------------------------------------------  Chemistries   Recent Labs Lab 12/15/15 1002  NA 136  K 3.6  CL 101  CO2 25  GLUCOSE 120*  BUN 21*  CREATININE 1.02  CALCIUM 8.8*  AST 117*  ALT 64*  ALKPHOS 123  BILITOT 0.8   ------------------------------------------------------------------------------------------------------------------ estimated creatinine clearance is 56 mL/min (by C-G formula based on SCr of 1.02 mg/dL). ------------------------------------------------------------------------------------------------------------------ No results for input(s): TSH, T4TOTAL, T3FREE, THYROIDAB in the last 72 hours.  Invalid input(s): FREET3  Coagulation profile No results for input(s): INR, PROTIME in the last 168 hours. ------------------------------------------------------------------------------------------------------------------- No results for input(s): DDIMER in the last 72 hours. -------------------------------------------------------------------------------------------------------------------  Cardiac Enzymes No results for input(s): CKMB, TROPONINI, MYOGLOBIN in the last 168 hours.  Invalid input(s): CK ------------------------------------------------------------------------------------------------------------------ No results found for:  BNP   ---------------------------------------------------------------------------------------------------------------  Urinalysis    Component Value Date/Time   COLORURINE YELLOW 03/30/2014 [place] 03/30/2014 1342   LABSPEC 1.020 03/30/2014 1342   PHURINE 5.5 03/30/2014 1342   GLUCOSEU NEGATIVE 03/30/2014 1342   HGBUR NEGATIVE 03/30/2014 1342   BILIRUBINUR negative 11/10/2015 1206   BILIRUBINUR neg 07/28/2014 1321   KETONESUR trace (5) (A) 11/10/2015 1206   KETONESUR NEGATIVE 03/30/2014 1342   PROTEINUR negative 11/10/2015 1206   PROTEINUR neg 07/28/2014 1321   PROTEINUR NEGATIVE 03/30/2014 1342   UROBILINOGEN 0.2 11/10/2015 1206   UROBILINOGEN 0.2 03/30/2014 1342   NITRITE Negative 11/10/2015 1206   NITRITE neg 07/28/2014 1321   NITRITE NEGATIVE 03/30/2014 1342   LEUKOCYTESUR Negative 11/10/2015 1206    ----------------------------------------------------------------------------------------------------------------   Imaging Results:    Dg Chest 2 View  Result Date: 12/15/2015 CLINICAL DATA:  Cough for 2 weeks, fever, headache, dizziness since last night, history Parkinson's EXAM: CHEST  2 VIEW COMPARISON:  03/30/2014 FINDINGS: Borderline enlargement of cardiac silhouette. Tortuous aorta. Mediastinal contours and pulmonary vascularity otherwise normal. Subsegmental atelectasis LEFT base. Lungs otherwise clear. No pleural effusion or pneumothorax. Thoracolumbar scoliosis. IMPRESSION: Subsegmental atelectasis LEFT base. Electronically Signed   By: Lavonia Dana M.D.   On: 12/15/2015 09:47     Assessment & Plan:    Active Problems:   Depression, major, recurrent, in partial remission ([place])   Primary hypothyroidism   Restless leg syndrome   Parkinson's disease ([place])   Autonomic postural hypotension   Pneumonia  CAP - Patient presents with fever, tachycardia, mild leukocytosis, even though no evidence of pneumonia chest x-ray, the lungs appear to be  convincing clinically for pneumonia, patient admitted under pneumonia pathway, follow blood cultures, sputum cultures, will start on IV Rocephin and azithromycin and repeat chest x-ray in a.m. after appropriate hydration. - Given high fever, headache and myalgia will start empirically on Tamiflu pending influenza PCR.  Autonomic postural hypotension - Blood pressure acceptable, continue with Florinef  Parkinson disease - Continue Sinemet  Restless leg syndrome - Continue with Requip  Hypothyroidism - Continue Synthroid  Depression - Continue with home medication  Recent right foot fracture - Following with Dr. Dellis Filbert as an outpatient    DVT Prophylaxis  Lovenox - SCDs  AM Labs Ordered, also please review Full Orders  Family Communication: Admission, patients condition and plan of care including  tests being ordered have been discussed with the patient and wife who indicate understanding and agree with the plan and Code Status.  Code Status Full  Likely DC to  Home  Condition GUARDED    Consults called: None  Admission status: Inpatient  Time spent in minutes : 11minutes   [facility], Rakwon Letourneau M.D on 12/15/2015 at 11:59 AM  Between 7am to 7pm - Pager - [phone]. After 7pm go to [email or web address] - password [facility]  [facility] - Office  [phone]

## 2015-12-15 NOTE — ED Notes (Signed)
EMT given tamilfu to give to RN upstairs.

## 2015-12-15 NOTE — ED Notes (Signed)
First temp was high rechecked it and got 99.8

## 2015-12-15 NOTE — ED Triage Notes (Addendum)
Per EMS, Pt, from home, c/o headache, dizziness, and nausea starting last night and cough x 2 weeks.  Pain score 5/10.  Pt has not taken anything for symptoms.  Pt did not taken BP medications this morning d/t nausea.  Hx of Parkinson's disease.  Pt at neuro baseline.   Pt's wife reports that he fell and hit his head x 10 days ago.  Pt was seen at an Urgent Care at that time.

## 2015-12-16 ENCOUNTER — Inpatient Hospital Stay (HOSPITAL_COMMUNITY): Payer: Medicare Other

## 2015-12-16 DIAGNOSIS — G20 Parkinson's disease: Secondary | ICD-10-CM

## 2015-12-16 DIAGNOSIS — F3341 Major depressive disorder, recurrent, in partial remission: Secondary | ICD-10-CM

## 2015-12-16 DIAGNOSIS — E039 Hypothyroidism, unspecified: Secondary | ICD-10-CM

## 2015-12-16 LAB — URINE CULTURE: Culture: <10000 — AB

## 2015-12-16 MED ORDER — ADULT MULTIVITAMIN W/MINERALS CH
1.0000 | ORAL_TABLET | Freq: Every day | ORAL | Status: DC
  Administered 2015-12-16: 1 via ORAL
  Filled 2015-12-16: qty 1

## 2015-12-16 MED ORDER — AZITHROMYCIN 250 MG PO TABS: ORAL_TABLET | ORAL | 0 refills | Status: DC

## 2015-12-16 MED ORDER — CEFUROXIME AXETIL 500 MG PO TABS: 500.0000 mg | ORAL_TABLET | Freq: Two times a day (BID) | ORAL | 0 refills | Status: DC

## 2015-12-16 NOTE — Progress Notes (Signed)
SATURATION QUALIFICATIONS: (This note is used to comply with regulatory documentation for home oxygen) 1 Patient Saturations on Room Air while Ambulating = *100  Patient Saturations on 0 Liters of oxygen while Ambulating = 96%  Please briefly explain why patient needs home oxygen not applicable

## 2015-12-16 NOTE — Progress Notes (Signed)
Pharmacy Antibiotic Note  [name] is a 70 y.o. male admitted on 12/15/2015 with worsening cough, congestion, dizziness, concern for CAP.  Pharmacy has been consulted for renal adjustment for antibiotic dosing.  tamiflu stopped as influenza PCT neg  Plan:  Ceftriaxone 1g IV q24h  Azithromycin 500mg  IV q24h  Neither antibiotic required adjustment for renal function, so pharmacy will sign off for antibiotic renal dose adjustment    Height: 5\' 7"  (170.2 cm) Weight: 125 lb 10.6 oz (57 kg) IBW/kg (Calculated) : 66.1  Temp (24hrs), Avg:100.6 F (38.1 C), Min:98.2 F (36.8 C), Max:103.6 F (39.8 C)   Recent Labs Lab 12/15/15 1002 12/15/15 1011  WBC 12.0*  --   CREATININE 1.02  --   LATICACIDVEN  --  1.37    Estimated Creatinine Clearance: 56.7 mL/min (by C-G formula based on SCr of 1.02 mg/dL).    Allergies  Allergen Reactions  . Ibuprofen Other (See Comments)    Blood in stool.  . Lamictal [Lamotrigine] Other (See Comments)    Blisters in mouth  . Naproxen Other (See Comments)    Bleeding in stool   . Nsaids Other (See Comments)    Bleeding in stool   . Penicillins Rash    .Marland KitchenHas patient had a PCN reaction causing immediate rash, facial/tongue/throat swelling, SOB or lightheadedness with hypotension: No Has patient had a PCN reaction causing severe rash involving mucus membranes or skin necrosis: No Has patient had a PCN reaction that required hospitalization No Has patient had a PCN reaction occurring within the last 10 years: No If all of the above answers are "NO", then may proceed with Cephalosporin use.     Antimicrobials this admission: 10/25 Ceftriaxone >>  10/25 Azithromycin >>  10/25 Oseltamivir >> 10/25  Dose adjustments this admission:  Microbiology results: 10/25 BCx: NGTD 10/25 UCx: sent 10/25 Sputum: needs collected 10/25 Influenza: neg 10/25 Strep pneumo ur Ag: neg  Thank you for allowing pharmacy to be a part of this patient's  care.  Doreene Eland, PharmD, BCPS.   Pager: [phone] 12/16/2015 9:01 AM

## 2015-12-16 NOTE — Discharge Summary (Signed)
Physician Discharge Summary  Reilley Dolan [id number] DOB: Dec 18, 1945 DOA: 12/15/2015  PCP: Betty Martinique, MD  Admit date: 12/15/2015 Discharge date: 12/16/2015  Admitted From: Home Disposition:  Home  Recommendations for Outpatient Follow-up:  1. Follow up with PCP in 1-2 weeks  Discharge Condition:Improved CODE STATUS:Full Diet recommendation: Heart healthy   Brief/Interim Summary: 70 y.o. male, With past medical history of Parkinson disease, hypothyroidism, restless leg syndrome, autonomic hypotension, resents with multiple complaints including cough, shortness of breath, fever and chills, headache, and generalized body ache, recent reports symptoms has been progressing over one week, started initially as cough, became productive with yellow sputum, and reports menorrhagia, generalized body ache, and headache, reports chills and fever at home as well, patient reports fall a few weeks ago, with right foot fracture, followed here by Dr. Dellis Filbert. -IN ED workup significant for fever 103.6 leukocytosis of 12, tachycardia, normal lactic acid, chest x-ray significant for atelectasis, but no pneumonia, patient received IV Rocephin and azithromycin in ED, hospitalist requested to admit.  CAP - Patient presented with fever, tachycardia, mild leukocytosis, even though no evidence of pneumonia chest x-ray, the lungs appear to be convincing clinically for pneumonia, patient admitted under pneumonia pathway, follow blood cultures, sputum cultures - Patient was continued on IV Rocephin and azithromycin with much improvement by the following morning - Flu neg -Patient ambulated in hallway, maintaining O2 sats on room air -Will complete course of abx with azithromycin and ceftin PO  Autonomic postural hypotension - Blood pressure acceptable, continued with Florinef  Parkinson disease - Continued Sinemet  Restless leg syndrome - Continued with Requip  Hypothyroidism - Continued  Synthroid  Depression - Continued with home medication  Recent right foot fracture - Following with Dr. Dellis Filbert as an outpatient  Discharge Diagnoses:  Active Problems:   Depression, major, recurrent, in partial remission ([place])   Primary hypothyroidism   Restless leg syndrome   Parkinson's disease ([place])   Autonomic postural hypotension   Pneumonia    Discharge Instructions     Medication List    TAKE these medications   aspirin 81 MG tablet Take 160 mg by mouth daily.   azithromycin 250 MG tablet Commonly known as:  ZITHROMAX 2 tabs on day 1, then 1 tab daily x total 5 days of treatment. Zero refills   carbidopa-levodopa 25-100 MG tablet Commonly known as:  SINEMET IR Take 1 tablet by mouth 4 (four) times daily.   cefUROXime 500 MG tablet Commonly known as:  CEFTIN Take 1 tablet (500 mg total) by mouth 2 (two) times daily with a meal.   citalopram 10 MG tablet Commonly known as:  CELEXA Take 1 tablet (10 mg total) by mouth daily.   fludrocortisone 0.1 MG tablet Commonly known as:  FLORINEF Take 0.5 tablets (0.05 mg total) by mouth daily. Take late morning around 10 AM   levothyroxine 112 MCG tablet Commonly known as:  SYNTHROID, LEVOTHROID Take 1 tablet daily   multivitamin with minerals tablet Take 1 tablet by mouth daily.   rOPINIRole 2 MG tablet Commonly known as:  REQUIP TAKE 1/2 TABLET BY MOUTH AT 6:30 AM, 1/2 TABLET AT NOON, 1/2 TABLET AT 5:00 PM, AND 1 AND 1/2 TABLETS AT 9:00 PM   Safinamide Mesylate 50 MG Tabs Commonly known as:  XADAGO Take 50 mg by mouth daily.   terbinafine 250 MG tablet Commonly known as:  LAMISIL Take 1 tablet (250 mg total) by mouth daily.   traMADol 50 MG tablet Commonly known as:  ULTRAM Take 50 mg by mouth every 6 (six) hours as needed for moderate pain.      Follow-up Information    Betty Martinique, MD. Schedule an appointment as soon as possible for a visit in 2 day(s).   Specialty:  Family  Medicine Contact information: [address] [place] [address] [phone]          Allergies  Allergen Reactions  . Ibuprofen Other (See Comments)    Blood in stool.  . Lamictal [Lamotrigine] Other (See Comments)    Blisters in mouth  . Naproxen Other (See Comments)    Bleeding in stool   . Nsaids Other (See Comments)    Bleeding in stool   . Penicillins Rash    .Marland KitchenHas patient had a PCN reaction causing immediate rash, facial/tongue/throat swelling, SOB or lightheadedness with hypotension: No Has patient had a PCN reaction causing severe rash involving mucus membranes or skin necrosis: No Has patient had a PCN reaction that required hospitalization No Has patient had a PCN reaction occurring within the last 10 years: No If all of the above answers are "NO", then may proceed with Cephalosporin use.      Procedures/Studies: Dg Chest 2 View  Result Date: 12/15/2015 CLINICAL DATA:  Cough for 2 weeks, fever, headache, dizziness since last night, history Parkinson's EXAM: CHEST  2 VIEW COMPARISON:  03/30/2014 FINDINGS: Borderline enlargement of cardiac silhouette. Tortuous aorta. Mediastinal contours and pulmonary vascularity otherwise normal. Subsegmental atelectasis LEFT base. Lungs otherwise clear. No pleural effusion or pneumothorax. Thoracolumbar scoliosis. IMPRESSION: Subsegmental atelectasis LEFT base. Electronically Signed   By: Lavonia Dana M.D.   On: 12/15/2015 09:47   Dg Chest Port 1 View  Result Date: 12/16/2015 CLINICAL DATA:  Cough. EXAM: PORTABLE CHEST 1 VIEW COMPARISON:  12/15/2015.  03/30/2014. FINDINGS: Mediastinum and hilar structures are stable. Mild mediastinal prominence is unchanged most likely secondary prominent great vessels. Stable mild cardiomegaly. No pulmonary venous congestion. No focal infiltrate. No pleural effusion or pneumothorax. IMPRESSION: Stable mild cardiomegaly. No pulmonary venous congestion. No acute pulmonary disease.  Electronically Signed   By: Marcello Moores  Register   On: 12/16/2015 06:42    Subjective: Eager to go home  Discharge Exam: Vitals:   12/16/15 0452 12/16/15 1415  BP: 115/78 112/70  Pulse: 64 61  Resp: 16 14  Temp: 98.2 F (36.8 C) 97.5 F (36.4 C)   Vitals:   12/15/15 1328 12/15/15 2057 12/16/15 0452 12/16/15 1415  BP:  117/78 115/78 112/70  Pulse:  83 64 61  Resp:  16 16 14   Temp: 99.5 F (37.5 C) 98.8 F (37.1 C) 98.2 F (36.8 C) 97.5 F (36.4 C)  TempSrc: Rectal Oral Oral Oral  SpO2:  97% 99% 97%  Weight:      Height:        General: Pt is alert, awake, not in acute distress Cardiovascular: RRR, S1/S2 +, no rubs, no gallops Respiratory: CTA bilaterally, no wheezing, no rhonchi Abdominal: Soft, NT, ND, bowel sounds + Extremities: no edema, no cyanosis   The results of significant diagnostics from this hospitalization (including imaging, microbiology, ancillary and laboratory) are listed below for reference.     Microbiology: Recent Results (from the past 240 hour(s))  Culture, blood (Routine x 2)     Status: None (Preliminary result)   Collection Time: 12/15/15  9:52 AM  Result Value Ref Range Status   Specimen Description BLOOD LEFT HAND  Final   Special Requests BOTTLES DRAWN AEROBIC AND ANAEROBIC 7ML  Final   Culture   Final    NO GROWTH 1 DAY Performed at [facility]    Report Status PENDING  Incomplete  Culture, blood (Routine x 2)     Status: None (Preliminary result)   Collection Time: 12/15/15  9:52 AM  Result Value Ref Range Status   Specimen Description BLOOD RIGHT FOREARM  Final   Special Requests BOTTLES DRAWN AEROBIC AND ANAEROBIC 5ML  Final   Culture   Final    NO GROWTH 1 DAY Performed at [facility]    Report Status PENDING  Incomplete  Urine culture     Status: Abnormal   Collection Time: 12/15/15  3:02 PM  Result Value Ref Range Status   Specimen Description URINE, RANDOM  Final   Special Requests NONE  Final   Culture  (A)  Final    <10,000 COLONIES/mL INSIGNIFICANT GROWTH Performed at [facility]    Report Status 12/16/2015 FINAL  Final     Labs: BNP (last 3 results) No results for input(s): BNP in the last 8760 hours. Basic Metabolic Panel:  Recent Labs Lab 12/15/15 1002  NA 136  K 3.6  CL 101  CO2 25  GLUCOSE 120*  BUN 21*  CREATININE 1.02  CALCIUM 8.8*   Liver Function Tests:  Recent Labs Lab 12/15/15 1002  AST 117*  ALT 64*  ALKPHOS 123  BILITOT 0.8  PROT 7.4  ALBUMIN 4.0   No results for input(s): LIPASE, AMYLASE in the last 168 hours. No results for input(s): AMMONIA in the last 168 hours. CBC:  Recent Labs Lab 12/15/15 1002  WBC 12.0*  NEUTROABS 10.9*  HGB 12.3*  HCT 37.2*  MCV 89.9  PLT 179   Cardiac Enzymes: No results for input(s): CKTOTAL, CKMB, CKMBINDEX, TROPONINI in the last 168 hours. BNP: Invalid input(s): POCBNP CBG: No results for input(s): GLUCAP in the last 168 hours. D-Dimer No results for input(s): DDIMER in the last 72 hours. Hgb A1c No results for input(s): HGBA1C in the last 72 hours. Lipid Profile No results for input(s): CHOL, HDL, LDLCALC, TRIG, CHOLHDL, LDLDIRECT in the last 72 hours. Thyroid function studies No results for input(s): TSH, T4TOTAL, T3FREE, THYROIDAB in the last 72 hours.  Invalid input(s): FREET3 Anemia work up No results for input(s): VITAMINB12, FOLATE, FERRITIN, TIBC, IRON, RETICCTPCT in the last 72 hours. Urinalysis    Component Value Date/Time   COLORURINE YELLOW 12/15/2015 1502   APPEARANCEUR CLEAR 12/15/2015 1502   LABSPEC 1.013 12/15/2015 1502   PHURINE 5.5 12/15/2015 1502   GLUCOSEU NEGATIVE 12/15/2015 1502   HGBUR MODERATE (A) 12/15/2015 1502   BILIRUBINUR NEGATIVE 12/15/2015 1502   BILIRUBINUR negative 11/10/2015 1206   BILIRUBINUR neg 07/28/2014 1321   KETONESUR NEGATIVE 12/15/2015 1502   PROTEINUR NEGATIVE 12/15/2015 1502   UROBILINOGEN 0.2 11/10/2015 1206   UROBILINOGEN 0.2  03/30/2014 1342   NITRITE NEGATIVE 12/15/2015 1502   LEUKOCYTESUR NEGATIVE 12/15/2015 1502   Sepsis Labs Invalid input(s): PROCALCITONIN,  WBC,  LACTICIDVEN Microbiology Recent Results (from the past 240 hour(s))  Culture, blood (Routine x 2)     Status: None (Preliminary result)   Collection Time: 12/15/15  9:52 AM  Result Value Ref Range Status   Specimen Description BLOOD LEFT HAND  Final   Special Requests BOTTLES DRAWN AEROBIC AND ANAEROBIC 7ML  Final   Culture   Final    NO GROWTH 1 DAY Performed at [facility]    Report Status PENDING  Incomplete  Culture, blood (Routine x 2)     Status: None (Preliminary result)   Collection Time: 12/15/15  9:52 AM  Result Value Ref Range Status   Specimen Description BLOOD RIGHT FOREARM  Final   Special Requests BOTTLES DRAWN AEROBIC AND ANAEROBIC 5ML  Final   Culture   Final    NO GROWTH 1 DAY Performed at [facility]    Report Status PENDING  Incomplete  Urine culture     Status: Abnormal   Collection Time: 12/15/15  3:02 PM  Result Value Ref Range Status   Specimen Description URINE, RANDOM  Final   Special Requests NONE  Final   Culture (A)  Final    <10,000 COLONIES/mL INSIGNIFICANT GROWTH Performed at [facility]    Report Status 12/16/2015 FINAL  Final     SIGNED:   Donne Hazel, MD  [facility] 12/16/2015, 6:36 PM  If 7PM-7AM, please contact night-coverage [email or web address] Password [email or web address]

## 2015-12-17 ENCOUNTER — Telehealth: Payer: Self-pay

## 2015-12-17 NOTE — Telephone Encounter (Signed)
LMTCB

## 2015-12-18 ENCOUNTER — Encounter: Payer: Self-pay | Admitting: Family Medicine

## 2015-12-18 LAB — BLOOD CULTURE ID PANEL (REFLEXED)
ACINETOBACTER BAUMANNII: NOT DETECTED
CANDIDA ALBICANS: NOT DETECTED
CANDIDA GLABRATA: NOT DETECTED
CANDIDA KRUSEI: NOT DETECTED
Candida parapsilosis: NOT DETECTED
Candida tropicalis: NOT DETECTED
Carbapenem resistance: NOT DETECTED
ENTEROBACTER CLOACAE COMPLEX: NOT DETECTED
ENTEROBACTERIACEAE SPECIES: NOT DETECTED
ESCHERICHIA COLI: NOT DETECTED
Enterococcus species: NOT DETECTED
HAEMOPHILUS INFLUENZAE: NOT DETECTED
KLEBSIELLA OXYTOCA: NOT DETECTED
Klebsiella pneumoniae: NOT DETECTED
LISTERIA MONOCYTOGENES: NOT DETECTED
METHICILLIN RESISTANCE: NOT DETECTED
NEISSERIA MENINGITIDIS: NOT DETECTED
PSEUDOMONAS AERUGINOSA: NOT DETECTED
Proteus species: NOT DETECTED
SERRATIA MARCESCENS: NOT DETECTED
STREPTOCOCCUS AGALACTIAE: NOT DETECTED
STREPTOCOCCUS PYOGENES: NOT DETECTED
STREPTOCOCCUS SPECIES: NOT DETECTED
Staphylococcus aureus (BCID): NOT DETECTED
Staphylococcus species: NOT DETECTED
Streptococcus pneumoniae: NOT DETECTED
Vancomycin resistance: NOT DETECTED

## 2015-12-18 NOTE — Treatment Plan (Signed)
Called by microbiology that previously negative blood cultures have now shown gm neg organisms as of this morning. Called patient at home. Patient reports feeling better. Is having some loose stools. Will prescribe 5 additional days of ceftin to pharmacy. Patient informed to go to urgent care, PCP, or ER if symptoms worsen or if pt becomes febrile. Also patient informed to be seen by provider if diarrhea worsens or becomes watery while on anitbiotics. Patient states he will reschedule appt with PCP from 11/8 until earlier this week for follow up. Will call in 5 additional days of ceftin. All questions answered and patient in agreement with plan.

## 2015-12-20 ENCOUNTER — Encounter: Payer: Self-pay | Admitting: Family Medicine

## 2015-12-20 ENCOUNTER — Ambulatory Visit (INDEPENDENT_AMBULATORY_CARE_PROVIDER_SITE_OTHER): Payer: Medicare Other | Admitting: Family Medicine

## 2015-12-20 VITALS — BP 124/78 | HR 75 | Temp 97.9°F | Resp 12 | Ht 67.0 in

## 2015-12-20 DIAGNOSIS — R7401 Elevation of levels of liver transaminase levels: Secondary | ICD-10-CM

## 2015-12-20 DIAGNOSIS — J181 Lobar pneumonia, unspecified organism: Secondary | ICD-10-CM | POA: Diagnosis not present

## 2015-12-20 DIAGNOSIS — E039 Hypothyroidism, unspecified: Secondary | ICD-10-CM | POA: Diagnosis not present

## 2015-12-20 DIAGNOSIS — J189 Pneumonia, unspecified organism: Secondary | ICD-10-CM

## 2015-12-20 DIAGNOSIS — R740 Nonspecific elevation of levels of transaminase and lactic acid dehydrogenase [LDH]: Secondary | ICD-10-CM | POA: Diagnosis not present

## 2015-12-20 LAB — CULTURE, BLOOD (ROUTINE X 2): CULTURE: NO GROWTH

## 2015-12-20 NOTE — Patient Instructions (Signed)
A few things to remember from today's visit:   Community acquired pneumonia of left lower lobe of lung ([place]) - Plan: CBC with Differential  No changes and monitor for new fever or new symptoms. Fall precautions. Keep appt with ortho.  Labs in 3-4 weeks.  Will continue following on blood culture.    Please be sure medication list is accurate. If a new problem present, please set up appointment sooner than planned today.

## 2015-12-20 NOTE — Telephone Encounter (Signed)
Spoke with pt and scheduled for OV this afternoon as pt had critical lab results. Nothing further needed.

## 2015-12-20 NOTE — Progress Notes (Signed)
Pre visit review using our clinic review tool, if applicable. No additional management support is needed unless otherwise documented below in the visit note. 

## 2015-12-20 NOTE — Progress Notes (Signed)
HPI:   [name] is a 70 y.o. male, who is here today with his wife to follow on recent hospitalization for transitional care management. Telephone follow-up attempt after discharged 12/17/15.  He was taken to the ER on 12/15/15 via EMS and discharged on 12/16/15. Wife called 911 when noted that he could not move, having dyspnea, chills, and fever. According to [name], he did not feels well the night before.  Dx with CAP, CXR on 12/15/15 showed subsegmental atelectasis left base. CBC with mild leukocytosis. Treated initially with Rocephin and Azithromycin IV.  He was discharged on abx treatment, currently on day 4th of Azithromycin and Ceftin.  Lab Results  Component Value Date   WBC 12.0 (H) 12/15/2015   HGB 12.3 (L) 12/15/2015   HCT 37.2 (L) 12/15/2015   MCV 89.9 12/15/2015   PLT 179 12/15/2015      Chemistry      Component Value Date/Time   NA 136 12/15/2015 1002   K 3.6 12/15/2015 1002   CL 101 12/15/2015 1002   CO2 25 12/15/2015 1002   BUN 21 (H) 12/15/2015 1002   CREATININE 1.02 12/15/2015 1002   CREATININE 0.90 11/10/2015 0918      Component Value Date/Time   CALCIUM 8.8 (L) 12/15/2015 1002   ALKPHOS 123 12/15/2015 1002   AST 117 (H) 12/15/2015 1002   ALT 64 (H) 12/15/2015 1002   BILITOT 0.8 12/15/2015 1002      Blood Cx's had been negative until recently when one started growing Gram negative rods (Left hand, aerobic and anaerobic media), reported 12/18/15, final report is pending. Ceftin for 5 more days was recently sent to his pharmacy to complete 10 days of treatment.  Rest of his medications unchanged.  He states that he is feeling "pretty good." + Mild productive cough with clear sputum.  He denies any fever, chills, myalgias, dyspnea, wheezing, or chest pain. His appetite is back to his normal. Hx of chronic fatigue, it is back to baseline.  He is tolerating medication well, has had mild diarrhea but no other side  effect. Wife agrees with his report.   -He is currently following with ortho, fell about 2 weeks ago, fracture 5th metatarsus and treated conservative, wearing a long boot right foot.   -Hx of hypothyroidism, Levothyroxine was recently adjusted.He was supposed to have lab repeated tomorrow.  Lab Results  Component Value Date   TSH 0.23 (L) 12/02/2015    -Also elevated transaminases, he was taking oral antifungal medication for chronic onychomycosis and discontinued medication a few days ago. Denies abdominal pain, nausea, vomiting, color changes of stool or urine.Marland Kitchen  He denies alcohol abuse.   Concerns today: None   Review of Systems  Constitutional: Positive for fatigue (No more than normal. ). Negative for activity change, appetite change, chills, diaphoresis, fever and unexpected weight change.  HENT: Negative for nosebleeds, sore throat and trouble swallowing.   Eyes: Negative for pain, redness and visual disturbance.  Respiratory: Positive for cough. Negative for shortness of breath and wheezing.   Cardiovascular: Negative for chest pain, palpitations and leg swelling.  Gastrointestinal: Positive for diarrhea. Negative for abdominal pain, nausea and vomiting.  Endocrine: Negative for cold intolerance and heat intolerance.  Genitourinary: Negative for decreased urine volume, difficulty urinating, dysuria and hematuria.  Musculoskeletal: Negative for myalgias and neck pain.  Skin: Negative for color change and rash.  Neurological: Negative for dizziness, seizures, syncope, weakness and headaches.  Psychiatric/Behavioral: Negative for  confusion. The patient is not nervous/anxious.       Current Outpatient Prescriptions on File Prior to Visit  Medication Sig Dispense Refill  . aspirin 81 MG tablet Take 160 mg by mouth daily.    Marland Kitchen azithromycin (ZITHROMAX) 250 MG tablet 2 tabs on day 1, then 1 tab daily x total 5 days of treatment. Zero refills 6 each 0  . carbidopa-levodopa  (SINEMET IR) 25-100 MG tablet Take 1 tablet by mouth 4 (four) times daily. 360 tablet 3  . cefUROXime (CEFTIN) 500 MG tablet Take 1 tablet (500 mg total) by mouth 2 (two) times daily with a meal. 10 tablet 0  . citalopram (CELEXA) 10 MG tablet Take 1 tablet (10 mg total) by mouth daily. 90 tablet 3  . fludrocortisone (FLORINEF) 0.1 MG tablet Take 0.5 tablets (0.05 mg total) by mouth daily. Take late morning around 10 AM 30 tablet 5  . levothyroxine (SYNTHROID, LEVOTHROID) 112 MCG tablet Take 1 tablet daily 90 tablet 3  . Multiple Vitamins-Minerals (MULTIVITAMIN WITH MINERALS) tablet Take 1 tablet by mouth daily.    Marland Kitchen rOPINIRole (REQUIP) 2 MG tablet TAKE 1/2 TABLET BY MOUTH AT 6:30 AM, 1/2 TABLET AT NOON, 1/2 TABLET AT 5:00 PM, AND 1 AND 1/2 TABLETS AT 9:00 PM 270 tablet 3  . Safinamide Mesylate (XADAGO) 50 MG TABS Take 50 mg by mouth daily. 30 tablet 5  . traMADol (ULTRAM) 50 MG tablet Take 50 mg by mouth every 6 (six) hours as needed for moderate pain.     Current Facility-Administered Medications on File Prior to Visit  Medication Dose Route Frequency Provider Last Rate Last Dose  . cyanocobalamin ((VITAMIN B-12)) injection 1,000 mcg  1,000 mcg Intramuscular Q30 days Orma Flaming, MD   1,000 mcg at 07/28/14 1344     Past Medical History:  Diagnosis Date  . Allergy   . Anemia   . Depression   . Parkinson's disease ([place])   . Parkinson's disease ([place]) 06/05/2012  . Raynaud's syndrome   . Thyroid disease    Allergies  Allergen Reactions  . Ibuprofen Other (See Comments)    Blood in stool.  . Lamictal [Lamotrigine] Other (See Comments)    Blisters in mouth  . Naproxen Other (See Comments)    Bleeding in stool   . Nsaids Other (See Comments)    Bleeding in stool   . Penicillins Rash    .Marland KitchenHas patient had a PCN reaction causing immediate rash, facial/tongue/throat swelling, SOB or lightheadedness with hypotension: No Has patient had a PCN reaction causing severe rash involving mucus  membranes or skin necrosis: No Has patient had a PCN reaction that required hospitalization No Has patient had a PCN reaction occurring within the last 10 years: No If all of the above answers are "NO", then may proceed with Cephalosporin use.     Social History   Social History  . Marital status: Married    Spouse name: [name]  . Number of children: 2  . Years of education: 12+   Occupational History  . DISABILITY Disability    due to Parkinson's   Social History Main Topics  . Smoking status: Never Smoker  . Smokeless tobacco: Never Used  . Alcohol use 0.0 - 4.2 oz/week     Comment: occas.  . Drug use: No  . Sexual activity: No   Other Topics Concern  . None   Social History Narrative   Patient is married [name])  with 2 children.  Patient is right handed.   Patient has high school education.   Patient drinks 1-2 cups daily.    Vitals:   12/20/15 1423  BP: 124/78  Pulse: 75  Resp: 12  Temp: 97.9 F (36.6 C)   There is no height or weight on file to calculate BMI.      Physical Exam  Nursing note and vitals reviewed. Constitutional: He is oriented to person, place, and time. He appears well-developed and well-nourished. No distress.  HENT:  Head: Atraumatic.  Mouth/Throat: Oropharynx is clear and moist and mucous membranes are normal.  Eyes: Conjunctivae and EOM are normal. No scleral icterus.  Cardiovascular: Normal rate and regular rhythm.   No murmur heard. Pulses:      Dorsalis pedis pulses are 2+ on the left side.  Respiratory: Effort normal and breath sounds normal. No respiratory distress.  GI: Soft. He exhibits no mass. There is no hepatomegaly. There is no tenderness.  Musculoskeletal: He exhibits no edema or tenderness.  Right long boot.   Lymphadenopathy:    He has no cervical adenopathy.  Neurological: He is alert and oriented to person, place, and time. He has normal strength. No cranial nerve deficit.  Stable gait, no assisted.     Skin: Skin is warm. No rash noted. No erythema.  Psychiatric: He has a normal mood and affect. Cognition and memory are normal.  Well groomed, good eye contact.      ASSESSMENT AND PLAN:     Clarnece was seen today for hospitalization follow-up.  Diagnoses and all orders for this visit:    Community acquired pneumonia of left lower lobe of lung ([place])  Clinically improved. Complete abx treatment. Daily Probiotic recommended. Will continue following BCx. Clearly instructed about warning signs. Further recommendations will be given according to lab result.    -     CBC with Differential  Primary hypothyroidism  Will re-schedule lab, TSH in 3-4 weeks. No changes in current management.  Elevated transaminase level  He already discontinued Lamisil. Caution with OTC Acetaminophen. Avoid alcohol intake. No changes in rest of his medications for now. Instructed about warning signs.   -     Hepatitis C Antibody; Future -     Hepatitis B Surface AntiBODY; Future -     Hepatitis B Surface AntiGEN; Future         Jodee Wagenaar G. Martinique, MD  [facility]. [place] office.

## 2015-12-21 ENCOUNTER — Encounter: Payer: Self-pay | Admitting: Family Medicine

## 2015-12-21 LAB — CBC WITH DIFFERENTIAL/PLATELET
Basophils Absolute: 0 10*3/uL (ref 0.0–0.1)
Basophils Relative: 0.3 % (ref 0.0–3.0)
EOS PCT: 2.2 % (ref 0.0–5.0)
Eosinophils Absolute: 0.1 10*3/uL (ref 0.0–0.7)
HCT: 35.9 % — ABNORMAL LOW (ref 39.0–52.0)
Hemoglobin: 12 g/dL — ABNORMAL LOW (ref 13.0–17.0)
LYMPHS ABS: 1.7 10*3/uL (ref 0.7–4.0)
Lymphocytes Relative: 25.3 % (ref 12.0–46.0)
MCHC: 33.4 g/dL (ref 30.0–36.0)
MCV: 89.9 fl (ref 78.0–100.0)
MONO ABS: 0.7 10*3/uL (ref 0.1–1.0)
MONOS PCT: 10.1 % (ref 3.0–12.0)
NEUTROS ABS: 4.1 10*3/uL (ref 1.4–7.7)
NEUTROS PCT: 62.1 % (ref 43.0–77.0)
PLATELETS: 270 10*3/uL (ref 150.0–400.0)
RBC: 3.99 Mil/uL — ABNORMAL LOW (ref 4.22–5.81)
RDW: 14.3 % (ref 11.5–15.5)
WBC: 6.6 10*3/uL (ref 4.0–10.5)

## 2015-12-21 IMAGING — RF DG SWALLOWING FUNCTION
1 series · 1 of 1 positions shown · non-contrast
Comparison: none

[Series 1: run · 1 of 1 slices shown]
[im 1/1]
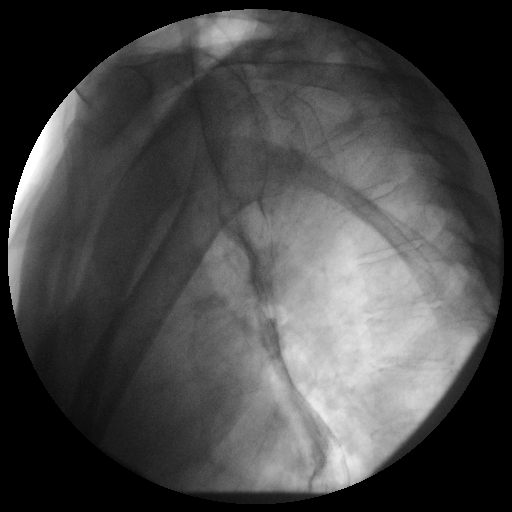

[1 of 1 positions shown; findings below may reference images not displayed]

FLUOROSCOPY FOR SWALLOWING FUNCTION STUDY:
Fluoroscopy was provided for swallowing function study, which was administered by a speech pathologist.  Final results and recommendations from this study are contained within the speech pathology report.

## 2015-12-22 ENCOUNTER — Encounter: Payer: Self-pay | Admitting: Family Medicine

## 2015-12-22 LAB — CULTURE, BLOOD (ROUTINE X 2)

## 2015-12-24 DIAGNOSIS — S92351D Displaced fracture of fifth metatarsal bone, right foot, subsequent encounter for fracture with routine healing: Secondary | ICD-10-CM | POA: Diagnosis not present

## 2015-12-29 ENCOUNTER — Ambulatory Visit: Payer: Medicare Other | Admitting: Family Medicine

## 2015-12-30 ENCOUNTER — Encounter: Payer: Self-pay | Admitting: Family Medicine

## 2016-01-04 ENCOUNTER — Ambulatory Visit: Payer: Medicare Other | Admitting: Neurology

## 2016-01-04 ENCOUNTER — Encounter: Payer: Self-pay | Admitting: Family Medicine

## 2016-01-10 DIAGNOSIS — S92351D Displaced fracture of fifth metatarsal bone, right foot, subsequent encounter for fracture with routine healing: Secondary | ICD-10-CM | POA: Diagnosis not present

## 2016-01-17 ENCOUNTER — Other Ambulatory Visit (INDEPENDENT_AMBULATORY_CARE_PROVIDER_SITE_OTHER): Payer: Medicare Other

## 2016-01-17 ENCOUNTER — Encounter: Payer: Self-pay | Admitting: Family Medicine

## 2016-01-17 DIAGNOSIS — R740 Nonspecific elevation of levels of transaminase and lactic acid dehydrogenase [LDH]: Secondary | ICD-10-CM

## 2016-01-17 DIAGNOSIS — R946 Abnormal results of thyroid function studies: Secondary | ICD-10-CM | POA: Diagnosis not present

## 2016-01-17 DIAGNOSIS — R7989 Other specified abnormal findings of blood chemistry: Secondary | ICD-10-CM

## 2016-01-17 DIAGNOSIS — B351 Tinea unguium: Secondary | ICD-10-CM

## 2016-01-17 DIAGNOSIS — R7401 Elevation of levels of liver transaminase levels: Secondary | ICD-10-CM

## 2016-01-17 LAB — HEPATIC FUNCTION PANEL
ALBUMIN: 4 g/dL (ref 3.5–5.2)
ALK PHOS: 90 U/L (ref 39–117)
ALT: 18 U/L (ref 0–53)
AST: 35 U/L (ref 0–37)
Bilirubin, Direct: 0 mg/dL (ref 0.0–0.3)
Total Bilirubin: 0.3 mg/dL (ref 0.2–1.2)
Total Protein: 7.2 g/dL (ref 6.0–8.3)

## 2016-01-17 LAB — T4, FREE: FREE T4: 1.53 ng/dL (ref 0.60–1.60)

## 2016-01-17 LAB — TSH: TSH: 0.05 u[IU]/mL — ABNORMAL LOW (ref 0.35–4.50)

## 2016-01-18 LAB — HEPATITIS B SURFACE ANTIBODY,QUALITATIVE: Hep B S Ab: NEGATIVE

## 2016-01-18 LAB — HEPATITIS C ANTIBODY: HCV Ab: NEGATIVE

## 2016-01-18 LAB — HEPATITIS B SURFACE ANTIGEN: HEP B S AG: NEGATIVE

## 2016-01-20 ENCOUNTER — Encounter: Payer: Self-pay | Admitting: Family Medicine

## 2016-01-21 ENCOUNTER — Encounter: Payer: Self-pay | Admitting: Family Medicine

## 2016-01-31 DIAGNOSIS — S92351D Displaced fracture of fifth metatarsal bone, right foot, subsequent encounter for fracture with routine healing: Secondary | ICD-10-CM | POA: Diagnosis not present

## 2016-02-16 ENCOUNTER — Ambulatory Visit (INDEPENDENT_AMBULATORY_CARE_PROVIDER_SITE_OTHER): Payer: Medicare Other | Admitting: Neurology

## 2016-02-16 ENCOUNTER — Encounter: Payer: Self-pay | Admitting: Neurology

## 2016-02-16 VITALS — BP 153/85 | HR 71 | Resp 20 | Ht 68.0 in | Wt 127.0 lb

## 2016-02-16 DIAGNOSIS — R1310 Dysphagia, unspecified: Secondary | ICD-10-CM

## 2016-02-16 DIAGNOSIS — G903 Multi-system degeneration of the autonomic nervous system: Secondary | ICD-10-CM | POA: Diagnosis not present

## 2016-02-16 DIAGNOSIS — G2581 Restless legs syndrome: Secondary | ICD-10-CM

## 2016-02-16 DIAGNOSIS — G20 Parkinson's disease: Secondary | ICD-10-CM

## 2016-02-16 MED ORDER — SAFINAMIDE MESYLATE 50 MG PO TABS: 50.0000 mg | ORAL_TABLET | Freq: Every day | ORAL | 3 refills | Status: DC

## 2016-02-16 MED ORDER — CARBIDOPA-LEVODOPA 25-100 MG PO TABS: 1.0000 | ORAL_TABLET | Freq: Every day | ORAL | 3 refills | Status: DC

## 2016-02-16 MED ORDER — ROPINIROLE HCL 2 MG PO TABS: ORAL_TABLET | ORAL | 3 refills | Status: DC

## 2016-02-16 NOTE — Patient Instructions (Addendum)
We will continue with the Elonda Husky once daily.  We will increase your Sinemet to 1 pill 5 times a day, at 7, 10, 1PM, 4PM, 7PM.  Please monitor your blood pressure.  We will do another swallow evaluation.

## 2016-02-16 NOTE — Progress Notes (Signed)
Subjective:    Patient ID: [name] is a 70 y.o. male.  HPI     Interim history:  [name] is a very pleasant 70 year old right-handed gentleman with an underlying medical history of hypothyroidism, OSA (not using CPAP currently, but tried in the past), RLS, MCI, CTS, depression, anemia, and allergies, who presents for followup consultation of his right-sided predominant, akinetic-rigid type Parkinson's disease, complicated by sleep disorder, RLS, memory loss, prior hallucinations, prior fall with injury, depression, and syncope. He is accompanied by his wife today. I last saw him on 08/25/2015, at which time he reported not feeling good with the Florinef and he stopped it. Wife did notice more memory issues, he had occasional constipation, no hallucinations. He did fall out of bed in March 2017. He improved with regards to feeling bad after he stopped the Florinef. I suggested we continue with his medications. He emailed me in the interim in August 2017 and requested to start [place], which I called in and we stopped the Azilect at the time. In August he also requested to restart Florinef which I started low dose at half a pill in the late morning.  Today, 02/16/2016: He reports feeling better, occasional Constipation, mild memory loss, mild difficulty processing. His wife reports that he has had more difficulty swallowing and routinely has a tendency to choke on all food consistencies and pills as well as thin liquids. He has not modified his food consistency. Of note, he had a swallow evaluation in March 2016 which recommended dysphagia diet, mechanical soft, and thickened liquids, no straw, and we went over the recommendations again today. Unfortunately, he had a fall and broke his foot. [place] had [place] the beach in early [place], slipped in the bathroom, after taking a shower and hurt his R foot, was taken to UC the next day in [place], [place] and X ray confirmed 5th R metatarsal Fx. He  was seen by [facility] orthopedics eventually, had to wear a boot for about 6 weeks. He is doing better in that regard. Unfortunately, he got sick about a week after he had his broken foot, and was hospitalized in late October 2017 for pneumonia. His wife retired in October 2017 and this has helped with the stress level. He quit taking the Florinef after he started feeling better with regards to his blood pressure. He took it for about a month. He has switched over from Azilect to Riley successfully, has noticed no side effects, and it is cheaper. He continues to take Sinemet one pill 4 times a day, approximately 4 hourly, starting at 7 AM.  Previously:     I saw him on 04/19/2015, at which time he reported having occasional lightheadedness upon standing. He had low energy and felt physically exhausted at times. Motor-wise, overall, he felt stable, mood wise, he felt that Celexa was helpful. He had no recent syncopal spells. He had lost a little bit of weight. He had not been using compression stockings.   I saw him on 12/14/2014, at which time he reported feeling more sedated. His wife noted more word finding difficuties, more forgetfulness, more fatigued, less physically active. He felt more depressed and had voiced suicidal ideations in the past but denied them at the time or recently and had no intent. He has a history of a reaction to Lamictal in the past. Is more depressed has voiced SI in the past, but not recently and no recent intent. He had a reaction to lamictal  in the past. He has abnormal posturing of his right upper extremity while walking and sitting sometimes. He has trouble maintaining sleep. He tried the gabapentin but did not feel good on it so he stopped it. Melatonin has not helped his sleep. I suggested that we keep his Sinemet at one pill 4 times a day, Requip 2 mg strength half a pill 3 times a day and 1-1/2 pills at bedtime, Azilect once daily, and for mood I suggested trial of  Celexa, 10 mg each night.    I saw him on 08/14/2014, at which time he reported more fatigue over the past 2 months. He reported having more stiffness in the afternoon. He received a B12 injection recently through his primary care physician. His B12 level was 428 at the time. His TSH was normal. He reported trouble staying asleep and melatonin was not helpful. He was drinking more water and had had no recent lightheadedness or fainting spell and no falls. He had some low back pain. He had a lumbar spine x-ray on 07/12/2014: Lumbar spine scoliosis with associated degenerative changes. I suggested we increase his Sinemet to 4 times a day and keep his ropinirole is same. I suggested low-dose gabapentin at night for his back pain and for sleep.   I saw him on 04/15/2014, at which time he reported feeling fine. He had no lightheadedness. He had not been using his compression stockings and in fact had not bought them yet. He did admit that he was not drinking enough water, estimating about 1 glass per day. He said he would walk 6 miles a day him a typically with his wife. I suggested he drink more water. He was advised to start using compression stockings every day, not at night. He was advised to monitor his blood pressure. For sleep I suggested he try melatonin. I suggested a one-month follow-up with our nurse practitioner. I kept his medications the same. In the interim, he was seen by Travis Givens, NP on 05/14/2014. He was felt to be stable. He had no new complaints. He was advised to continue to stay well-hydrated and use compression stockings. He was advised to continue taking Azilect, Sinemet and Requip.     I saw him on 05/12/13, at which time he reported lightheadedness and his BP was in fact low. He denies any recent syncopal spells and no falls. He stopped Toviaz some 4 months prior (due to cost). He denied recent illness and recent medication changes. He had some word finding difficulty. He was driving  and had not had recent issues driving. He had no constipation or hallucinations and was sleeping well.   In the interim, he was seen by our nurse practitioner, [name] on 11/13/2013, at which time he was stable, his blood pressure was stable, and he was kept on the same medications including Azilect, Sinemet and Requip.   In the interim, he was hospitalized from 03/30/2014 through 04/01/2014 for a syncopal spell. He had a syncopal event at home on 03/30/2014 and found himself down after having a warning sign of dizziness, and sweating around 6:54 PM. He went to see his primary care physician and was advised to come in to the emergency room. He had another presyncopal spell the same day. I reviewed the hospital records including the discharge summary. He had a echocardiogram on 03/31/2014 which showed mild tricuspid regurgitation, EF of 65-70%, no regional wall motion abnormalities, mild aortic regurgitation, mild mitral regurgitation, and mild concentric left ventricular hypertrophy.  Carotid Doppler studies from 04/01/2014 showed: 1-39% internal carotid artery stenosis bilaterally. There was antegrade vertebral artery flow. He had a head CT without contrast on 03/30/2014: 1. Negative intracranial imaging. 2. Remote subcondylar left mandible fracture with chronic TMJ dislocation. In addition, I personally reviewed the images through the PACS system. He was discharged home. He was seen by his cardiologist on 04/09/2014, and did not have any orthostatic blood pressure drops. He had had systolic blood pressures in the 160s in the hospital. His blood pressure at the time of his office visit with Dr. Debara Pickett was 132/87. Dr. Debara Pickett felt that he would not be a good candidate for midodrine in the context of no significant orthostatic blood pressure drop and normal blood pressure values at baseline. He emailed me regarding a trial of Droxidopa. The patient was advised to start using compression stockings which were  previously recommended to him but he did not follow through with that.   I saw him on 11/12/2012, at which time he presented after a recent syncopal spell. I felt at the time that the syncope was secondary to a combination of things including autonomic dysregulation in the context of Parkinson's disease, exposure to heat for prolonged period of time with dehydration and quick changes in position as well as potential medication side effects. He had a head CT which was negative. He also had recent blood work. I did not change his medications at the time but talk to him about supportive measures including changing positions slowly, avoid prolonged heat exposure, staying well hydrated and using a stool for working in the yard and taking frequent breaks when working outside.    He had an event of LOC on 10/30/12. He reported that he had not been feeling well for the past 2 weeks prior to his syncope. He had been feeling dizzy especially with change of position with intermittent nausea reported. He had been in his yard working for several hours and became dizzy and had a blackout spell. He fell into the bushes, and came to after some seconds. He was bending up and down at lot. After he regained consciousness, he was feeling weak and eventually was able to go to the porch, where he sat for about 45 minutes. His wife was at work. He had some slight confusion and lack of energy, but denied any focal weakness/CP/palpitations/SOB. He did not have any prior syncopal spells. He had blood work at the Urgent Care and saw Dr. Tamala Julian on 10/31/2012. His BUN was borderline at 24, he had a CTH on 10/31/12, which was negative and yesterday went for cardiological consultation with Dr. Debara Pickett, who felt, that he had a vasovagal event in the context of dysautonomia with PD.   I first met him on 06/05/12, at which time I felt his Parkinson's disease was stable and I did not make any medication changes.   He previously followed with Dr.  Morene Antu and was last seen by him on 02/05/2012, at which time he was complaining of right hip pain. No medication changes were done at the time. He has been on Synthroid, Sinemet 25/100 mg strength one tablet 3 times a day, Requip 2 mg strength half a pill at 6:30, half a pill at noon, half a pill at 5 PM and one and a half a pill at 9 PM. He is on rasagiline 1 mg once daily, baby aspirin, multivitamin, Toviaz.   He received a steroid injection into the R hip in 8/14, which helped  the pain. He worked for [employer]. He lives with his wife in a one storey at [facility] [address]. They have 2 grown children, son and daughter, both local. Two MAs and 4 cousins had Huntington's and he has been tested and was negative for it. Dr. Erling Cruz arranged the genetic testing. He has occasional depressive Sx, but better when he can get out and walk, he walks about 6 miles/day.   He was diagnosed with Parkinson's disease in July 2009, with symptoms going back a year prior and consisted of trouble with speech and eating. He was initially started on Requip, Azilect and and Sinemet. He had side effects from the Requip including excessive spending, daytime sleepiness and hypersexuality. His Requip dose was therefore decreased. He exercises regularly. He is independent in his ADLs. He has had visual hallucinations. He fell in October 2011 fracturing his left distal radius, requiring surgery in December 2011. He had right-sided leg cramps improved with Flexeril, but stopped the medication. He denied leg edema, or compulsive behavior. He has bladder incontinence. In December 2013 his MMSE was 29, clock drawing was poor and animal fluency was 17, geriatric depression scale was 8/15.    His Past Medical History Is Significant For: Past Medical History:  Diagnosis Date  . Allergy   . Anemia   . Depression   . Parkinson's disease ([place])   . Parkinson's disease ([place]) 06/05/2012  . Raynaud's syndrome   . Thyroid disease     His  Past Surgical History Is Significant For: Past Surgical History:  Procedure Laterality Date  . APPENDECTOMY    . FRACTURE SURGERY     jaw  . HERNIA REPAIR    . MANDIBLE FRACTURE SURGERY    . ROTATOR CUFF REPAIR    . TONSILLECTOMY    . VASECTOMY    . WRIST FRACTURE SURGERY      His Family History Is Significant For: Family History  Problem Relation Age of Onset  . Cancer Father     colon    His Social History Is Significant For: Social History   Social History  . Marital status: Married    Spouse name: [name]  . Number of children: 2  . Years of education: 12+   Occupational History  . DISABILITY Disability    due to Parkinson's   Social History Main Topics  . Smoking status: Never Smoker  . Smokeless tobacco: Never Used  . Alcohol use 0.0 - 4.2 oz/week     Comment: occas.  . Drug use: No  . Sexual activity: No   Other Topics Concern  . None   Social History Narrative   Patient is married [name])  with 2 children.   Patient is right handed.   Patient has high school education.   Patient drinks 1-2 cups daily.    His Allergies Are:  Allergies  Allergen Reactions  . Ibuprofen Other (See Comments)    Blood in stool.  . Lamictal [Lamotrigine] Other (See Comments)    Blisters in mouth  . Naproxen Other (See Comments)    Bleeding in stool   . Nsaids Other (See Comments)    Bleeding in stool   . Penicillins Rash    .Marland KitchenHas patient had a PCN reaction causing immediate rash, facial/tongue/throat swelling, SOB or lightheadedness with hypotension: No Has patient had a PCN reaction causing severe rash involving mucus membranes or skin necrosis: No Has patient had a PCN reaction that required hospitalization No Has patient had a PCN  reaction occurring within the last 10 years: No If all of the above answers are "NO", then may proceed with Cephalosporin use.   :   His Current Medications Are:  Outpatient Encounter Prescriptions as of 02/16/2016  Medication Sig   . aspirin 81 MG tablet Take 160 mg by mouth daily.  . carbidopa-levodopa (SINEMET IR) 25-100 MG tablet Take 1 tablet by mouth 4 (four) times daily.  . cefUROXime (CEFTIN) 500 MG tablet Take 1 tablet (500 mg total) by mouth 2 (two) times daily with a meal.  . citalopram (CELEXA) 10 MG tablet Take 1 tablet (10 mg total) by mouth daily.  . fludrocortisone (FLORINEF) 0.1 MG tablet Take 0.5 tablets (0.05 mg total) by mouth daily. Take late morning around 10 AM  . levothyroxine (SYNTHROID, LEVOTHROID) 112 MCG tablet Take 1 tablet daily  . Multiple Vitamins-Minerals (MULTIVITAMIN WITH MINERALS) tablet Take 1 tablet by mouth daily.  Marland Kitchen rOPINIRole (REQUIP) 2 MG tablet TAKE 1/2 TABLET BY MOUTH AT 6:30 AM, 1/2 TABLET AT NOON, 1/2 TABLET AT 5:00 PM, AND 1 AND 1/2 TABLETS AT 9:00 PM  . Safinamide Mesylate (XADAGO) 50 MG TABS Take 50 mg by mouth daily.  . [DISCONTINUED] azithromycin (ZITHROMAX) 250 MG tablet 2 tabs on day 1, then 1 tab daily x total 5 days of treatment. Zero refills  . [DISCONTINUED] traMADol (ULTRAM) 50 MG tablet Take 50 mg by mouth every 6 (six) hours as needed for moderate pain.   Facility-Administered Encounter Medications as of 02/16/2016  Medication  . cyanocobalamin ((VITAMIN B-12)) injection 1,000 mcg  :  Review of Systems:  Out of a complete 14 point review of systems, all are reviewed and negative with the exception of these symptoms as listed below: Review of Systems  Neurological:       Pt presents today to discuss his parkinson's disease. Pt has had a recent fall in which he broke his foot.    Objective:  Neurologic Exam  Physical Exam Physical Examination:   Vitals:   02/16/16 1437  BP: (!) 153/85  Foster: 71  Resp: 20   General Examination: The patient is a very pleasant 70 y.o. male in no acute distress. He does not have significant lightheadedness upon standing. He is in good spirits today.   HEENT: Normocephalic, atraumatic, pupils are equal, round and  reactive to light and accommodation. Extraocular tracking shows mild saccadic breakdown without nystagmus noted. There is no limitation to his gaze. There is mild decrease in eye blink rate. Hearing is intact. Face is symmetric with moderate facial masking and normal facial sensation. There is no lip, neck or jaw tremor. Neck is moderately to severely rigid with decrease in passive ROM. There are no carotid bruits on auscultation. Oropharynx exam reveals mild mouth dryness. No significant airway crowding is noted. Mallampati is class II. Tongue protrudes centrally and palate elevates symmetrically. He has minimal drooling, unchanged.   Chest: is clear to auscultation without wheezing, rhonchi or crackles noted.  Heart: sounds are regular and normal without murmurs, rubs or gallops noted.   Abdomen: is soft, non-tender and non-distended with normal bowel sounds appreciated on auscultation.  Extremities: There is no pitting edema in the distal lower extremities bilaterally. Pedal pulses are intact.  Skin: is warm and dry with no trophic changes noted.  Musculoskeletal: exam reveals no obvious joint deformities, tenderness or joint swelling or erythema.  Neurologically:  Mental status: The patient is awake and alert, paying good attention. He is able to provide the  history. His wife supplements with details. He is oriented to: person, place, time/date, situation, day of week, month of year and year. His memory, attention, language and knowledge are intact. There is no aphasia, agnosia, apraxia or anomia. There is a mild degree of bradyphrenia. Speech is moderately hypophonic with minimal dysarthria noted. Mood is congruent and affect is normal.  Cranial nerves are as described above under HEENT exam. In addition, shoulder shrug is normal with equal shoulder height noted.  Motor exam: Normal bulk, and strength for age is noted. Tone is mildly rigid with presence of cogwheeling in the right upper  extremity. There is overall moderate bradykinesia. There is no drift or rebound. There is no tremor. He has mild to moderate dystonic posturing of his right arm and hand. Romberg is negative. Reflexes are 2+ in the upper extremities and 1+ in the lower extremities. Fine motor skills exam reveals: Finger taps are moderately impaired on the right and mild to moderately impaired on the left. Hand movements are moderately impaired on the right and mild to moderately impaired on the left. RAP (rapid alternating patting) is moderately impaired on the right and moderately impaired on the left. Foot taps are moderately impaired on the right and mild to moderately impaired on the left. Foot agility (in the form of heel stomping) is moderately impaired on the right and mild to moderately impaired on the left.    Cerebellar testing shows no dysmetria or intention tremor on finger to nose testing. There is no truncal or gait ataxia. Heel to shin is unremarkable b/l.   Sensory exam is intact to light touch in the UEs and LEs bilaterally.  Gait, station and balance: He stands up from the seated position with mild difficulty and needs no assistance. He does not feel lightheaded upon standing. No veering to one side is noted. He is noted to lean slightly to the R side. Posture is mild to moderately stooped. No lightheadedness reported. Stance is narrow-based. He walks with decrease in stride length and pace and near absent arm swing on the right and mild to moderated dystonic posturing with the right arm and hand. He turns in 3 steps and there is slight insecurity with turn. Balance is mildly impaired.   Assessment and Plan:   In summary, SEVERO BEBER is a very pleasant 70 year old male with a history of right-sided predominant Parkinson's disease of the akinetic-rigid type, complicated by a syncopal spells (September 2014 and February 2016), for which he had full workup and has also seen cardiology (last seen by Dr.  Debara Pickett in 3/16 with prn FU recommended). He may very well have Parkinson's related autonomic dysregulation which often results in supine hypertension at times and sudden drops in blood pressure and falls and often some injuries. He had a fall out of bed and  most recently had an accident in the bathroom when he slipped on wet floor and broke his right foot. He had restarted Florinef in August 2017 but took it for about a month only. We started Florinef in February 2017 but he stopped it due to side effects at the time. We switched him from generic Azilect to Xadago 50 mg once daily. He feels it is as effective as the Azilect and quite a bit cheaper. I renewed the prescription for this. He is again advised to stay well-hydrated, change positions slowly, think about starting to use a walker which he declined at this time. Wife has retired and is more closely  around him which is reassuring. She indicated more issues with swallowing. We will reevaluate this with a swallow study and speech pathology evaluation and recommendations. His motor function has declined as well. I would like to increase his Sinemet to 1 pill 5 times a day, namely at 7, 10, 1 PM, 4 PM and 7 PM. He is in agreement. I did ask him to monitor symptoms of lightheadedness and dizziness which could get worse after we increase the Sinemet. He has not had any evidence of dyskinesias. He will continue with low-dose ropinirole, 2 mg strength, half pill 4 times a day. We will monitor his exam and he is quite good about getting in touch with [place] the email or phone call. I would like to see him back in 4 months, sooner as needed. I answered all their questions today and the patient and his wife were in agreement. I spent 25 min in total face-to-face time with the patient, more 50% of which was spent in counseling and coordination of care, reviewing test results, reviewing medication and reviewing the diagnosis of PD, NOH, the prognosis and treatment options.

## 2016-02-24 ENCOUNTER — Ambulatory Visit (HOSPITAL_COMMUNITY)
Admission: RE | Admit: 2016-02-24 | Discharge: 2016-02-24 | Disposition: A | Discharge status: Home / Self Care | Payer: Medicare Other | Source: Ambulatory Visit | Attending: Neurology | Admitting: Neurology

## 2016-02-24 DIAGNOSIS — E079 Disorder of thyroid, unspecified: Secondary | ICD-10-CM | POA: Insufficient documentation

## 2016-02-24 DIAGNOSIS — G903 Multi-system degeneration of the autonomic nervous system: Secondary | ICD-10-CM | POA: Diagnosis not present

## 2016-02-24 DIAGNOSIS — G2581 Restless legs syndrome: Secondary | ICD-10-CM | POA: Insufficient documentation

## 2016-02-24 DIAGNOSIS — R1310 Dysphagia, unspecified: Secondary | ICD-10-CM | POA: Diagnosis not present

## 2016-02-24 DIAGNOSIS — G20 Parkinson's disease: Secondary | ICD-10-CM

## 2016-02-24 DIAGNOSIS — R413 Other amnesia: Secondary | ICD-10-CM | POA: Diagnosis not present

## 2016-02-24 DIAGNOSIS — I7300 Raynaud's syndrome without gangrene: Secondary | ICD-10-CM | POA: Diagnosis not present

## 2016-02-24 DIAGNOSIS — F329 Major depressive disorder, single episode, unspecified: Secondary | ICD-10-CM | POA: Diagnosis not present

## 2016-02-24 DIAGNOSIS — D649 Anemia, unspecified: Secondary | ICD-10-CM | POA: Diagnosis not present

## 2016-02-24 DIAGNOSIS — G20A1 Parkinson's disease without dyskinesia, without mention of fluctuations: Secondary | ICD-10-CM

## 2016-02-24 DIAGNOSIS — K5900 Constipation, unspecified: Secondary | ICD-10-CM | POA: Insufficient documentation

## 2016-02-25 NOTE — Progress Notes (Signed)
The swallow study results indicate moderate risk for aspiration, fairly unchanged findings from last time, in 2016. Recs are, per speech pathologist: Mech soft solids, meaning shopped up foods, small pieces, does not have to be purreed. Thin liquids through cup, no straw. Compensation techniques: slow rate, small sips and bites, multiple dry swallows after each bite or sip, follow solids with liquid to help food get down, clear throat intermittently. Remain semi-upright after meals, sit upright at 90 degrees for eating and drinking. Furthermore, I recommend: no "multitasking" while eating, as in, no talking, reading, playing, watching TV. Focus on the task of eating or drinking only.   Please call patient or wife to report, thx. Travis Age, MD, PhD [facility] ([place])

## 2016-02-28 ENCOUNTER — Telehealth: Payer: Self-pay

## 2016-02-28 DIAGNOSIS — M5441 Lumbago with sciatica, right side: Secondary | ICD-10-CM | POA: Diagnosis not present

## 2016-02-28 DIAGNOSIS — S92351D Displaced fracture of fifth metatarsal bone, right foot, subsequent encounter for fracture with routine healing: Secondary | ICD-10-CM | POA: Diagnosis not present

## 2016-02-28 NOTE — Telephone Encounter (Signed)
I spoke to wife and she is aware of results and recommendations below.  She wanted to let you know that patient has hurt his back and ortho has ordered MRI of the back.

## 2016-02-28 NOTE — Telephone Encounter (Signed)
-----   Message from Star Age, MD sent at 02/25/2016  1:26 PM EST ----- The swallow study results indicate moderate risk for aspiration, fairly unchanged findings from last time, in 2016. Recs are, per speech pathologist: Mech soft solids, meaning shopped up foods, small pieces, does not have to be purreed. Thin liquids through cup, no straw. Compensation techniques: slow rate, small sips and bites, multiple dry swallows after each bite or sip, follow solids with liquid to help food get down, clear throat intermittently. Remain semi-upright after meals, sit upright at 90 degrees for eating and drinking. Furthermore, I recommend: no "multitasking" while eating, as in, no talking, reading, playing, watching TV. Focus on the task of eating or drinking only.   Please call patient or wife to report, thx. Star Age, MD, PhD [facility] ([place])

## 2016-03-07 DIAGNOSIS — M5441 Lumbago with sciatica, right side: Secondary | ICD-10-CM | POA: Diagnosis not present

## 2016-03-13 ENCOUNTER — Encounter: Payer: Self-pay | Admitting: Family Medicine

## 2016-03-13 ENCOUNTER — Ambulatory Visit (INDEPENDENT_AMBULATORY_CARE_PROVIDER_SITE_OTHER): Payer: Medicare Other

## 2016-03-13 ENCOUNTER — Other Ambulatory Visit (INDEPENDENT_AMBULATORY_CARE_PROVIDER_SITE_OTHER): Payer: Medicare Other

## 2016-03-13 DIAGNOSIS — M5441 Lumbago with sciatica, right side: Secondary | ICD-10-CM | POA: Diagnosis not present

## 2016-03-13 DIAGNOSIS — R7989 Other specified abnormal findings of blood chemistry: Secondary | ICD-10-CM

## 2016-03-13 DIAGNOSIS — M48062 Spinal stenosis, lumbar region with neurogenic claudication: Secondary | ICD-10-CM | POA: Diagnosis not present

## 2016-03-13 DIAGNOSIS — Z23 Encounter for immunization: Secondary | ICD-10-CM | POA: Diagnosis not present

## 2016-03-13 DIAGNOSIS — R946 Abnormal results of thyroid function studies: Secondary | ICD-10-CM

## 2016-03-13 LAB — TSH: TSH: 0.47 u[IU]/mL (ref 0.35–4.50)

## 2016-03-14 ENCOUNTER — Other Ambulatory Visit: Payer: Self-pay | Admitting: Orthopedic Surgery

## 2016-03-14 DIAGNOSIS — M48062 Spinal stenosis, lumbar region with neurogenic claudication: Secondary | ICD-10-CM

## 2016-03-15 ENCOUNTER — Other Ambulatory Visit: Payer: Medicare Other

## 2016-03-16 ENCOUNTER — Encounter: Payer: Self-pay | Admitting: Family Medicine

## 2016-03-16 ENCOUNTER — Ambulatory Visit (INDEPENDENT_AMBULATORY_CARE_PROVIDER_SITE_OTHER): Payer: Medicare Other | Admitting: Family Medicine

## 2016-03-16 ENCOUNTER — Ambulatory Visit
Admission: RE | Admit: 2016-03-16 | Discharge: 2016-03-16 | Disposition: A | Discharge status: Home / Self Care | Payer: Self-pay | Source: Ambulatory Visit | Attending: Orthopedic Surgery | Admitting: Orthopedic Surgery

## 2016-03-16 ENCOUNTER — Other Ambulatory Visit: Payer: Self-pay | Admitting: Orthopedic Surgery

## 2016-03-16 VITALS — BP 110/64 | HR 68 | Resp 12 | Ht 68.0 in | Wt 131.5 lb

## 2016-03-16 DIAGNOSIS — R5382 Chronic fatigue, unspecified: Secondary | ICD-10-CM | POA: Diagnosis not present

## 2016-03-16 DIAGNOSIS — E039 Hypothyroidism, unspecified: Secondary | ICD-10-CM | POA: Diagnosis not present

## 2016-03-16 DIAGNOSIS — M48062 Spinal stenosis, lumbar region with neurogenic claudication: Secondary | ICD-10-CM

## 2016-03-16 DIAGNOSIS — M5410 Radiculopathy, site unspecified: Secondary | ICD-10-CM | POA: Diagnosis not present

## 2016-03-16 DIAGNOSIS — F3341 Major depressive disorder, recurrent, in partial remission: Secondary | ICD-10-CM

## 2016-03-16 NOTE — Progress Notes (Signed)
Pre visit review using our clinic review tool, if applicable. No additional management support is needed unless otherwise documented below in the visit note. 

## 2016-03-16 NOTE — Progress Notes (Signed)
HPI:   [name] is a 71 y.o. male, who is here today to follow on some of his chronic medical problems.  Since his last OV he has sen [name] (neuro) to follow on Parkinson's disease.   Depression:  He is currently on Celexa 10 mg, started by [name] a few months ago. Medication has helped but for the past couple months he has felt less motivated, frustrated, and anxious.  Problem is exacerbated by his health problems, in particular symptoms of Parkinson's and aggravated by the new problem, He denies suicidal thoughts. He has tolerated medication well and denies side effects.   Hypothyroidism:  Currently he is on Levothyroxine 112 mcg x 5 and 1/2 tab Tuesday and Thursday. Tolerating medication well, no side effects reported. He has not noted palpitations, abdominal pain, changes in bowel habits, worsening tremor, cold/heat intolerance, or abnormal weight loss.   Lab Results  Component Value Date   TSH 0.47 03/13/2016    Constipation: Last OV he was also c/o constipation. He has increased fiber intake and problem has improved. He is having bowel movements daily.  Concerns today: Right leg pain  He is c/o RLE pain that started 1-2 days after last OV and attributed to long boot he was wearing to treat foot fracture.   He had lumbar MRI, according to pt, it was negative, pending myelogram.  Lower back pain "shooting" down to RLE. Pain is severe and exacerbated by applying pressure to right foot, walking. Alleviated by rest.  He is on Gabapentin and just completed Prednisone taper but did not help.  + Fatigue. Pain is keeping him awake. He denies lower extremity numbness or tingling, saddle anesthesia, urine/bowel incontinence. Today he is supposed to increase Gabapentin from 300 mg to 600 mg.   Review of Systems  Constitutional: Positive for fatigue. Negative for appetite change, fever and unexpected weight change.  HENT: Negative for mouth  sores, nosebleeds, trouble swallowing and voice change.   Respiratory: Negative for cough, shortness of breath and wheezing.   Cardiovascular: Negative for chest pain, palpitations and leg swelling.  Gastrointestinal: Negative for abdominal pain, blood in stool, nausea and vomiting.  Endocrine: Negative for cold intolerance and heat intolerance.  Genitourinary: Negative for decreased urine volume, dysuria and hematuria.  Musculoskeletal: Positive for back pain and gait problem.  Skin: Negative for rash.  Neurological: Negative for syncope, weakness, numbness and headaches.  Psychiatric/Behavioral: Positive for sleep disturbance. Negative for confusion. The patient is nervous/anxious.       Current Outpatient Prescriptions on File Prior to Visit  Medication Sig Dispense Refill  . aspirin 81 MG tablet Take 160 mg by mouth daily.    . carbidopa-levodopa (SINEMET IR) 25-100 MG tablet Take 1 tablet by mouth 5 (five) times daily. 450 tablet 3  . citalopram (CELEXA) 10 MG tablet Take 1 tablet (10 mg total) by mouth daily. 90 tablet 3  . fludrocortisone (FLORINEF) 0.1 MG tablet Take 0.5 tablets (0.05 mg total) by mouth daily. Take late morning around 10 AM 30 tablet 5  . levothyroxine (SYNTHROID, LEVOTHROID) 112 MCG tablet Take 1 tablet daily 90 tablet 3  . Multiple Vitamins-Minerals (MULTIVITAMIN WITH MINERALS) tablet Take 1 tablet by mouth daily.    Marland Kitchen rOPINIRole (REQUIP) 2 MG tablet TAKE 1/2 TABLET BY MOUTH AT 6:30 AM, 1/2 TABLET AT NOON, 1/2 TABLET AT 5:00 PM, AND 1 AND 1/2 TABLETS AT 8:00 PM 270 tablet 3  . Safinamide Mesylate (XADAGO) 50  MG TABS Take 50 mg by mouth daily. 90 tablet 3   Current Facility-Administered Medications on File Prior to Visit  Medication Dose Route Frequency Provider Last Rate Last Dose  . cyanocobalamin ((VITAMIN B-12)) injection 1,000 mcg  1,000 mcg Intramuscular Q30 days Travis Flaming, MD   1,000 mcg at 07/28/14 1344     Past Medical History:  Diagnosis Date    . Allergy   . Anemia   . Depression   . Parkinson's disease ([place])   . Parkinson's disease ([place]) 06/05/2012  . Raynaud's syndrome   . Thyroid disease    Allergies  Allergen Reactions  . Lamictal [Lamotrigine] Other (See Comments)    Blisters in mouth  . Nsaids Other (See Comments)    Bleeding in stool   . Penicillins Rash    .Marland KitchenHas patient had a PCN reaction causing immediate rash, facial/tongue/throat swelling, SOB or lightheadedness with hypotension: No Has patient had a PCN reaction causing severe rash involving mucus membranes or skin necrosis: No Has patient had a PCN reaction that required hospitalization No Has patient had a PCN reaction occurring within the last 10 years: No If all of the above answers are "NO", then may proceed with Cephalosporin use.     Social History   Social History  . Marital status: Married    Spouse name: [name]  . Number of children: 2  . Years of education: 12+   Occupational History  . DISABILITY Disability    due to Parkinson's   Social History Main Topics  . Smoking status: Never Smoker  . Smokeless tobacco: Never Used  . Alcohol use 0.0 - 4.2 oz/week     Comment: occas.  . Drug use: No  . Sexual activity: No   Other Topics Concern  . None   Social History Narrative   Patient is married [name])  with 2 children.   Patient is right handed.   Patient has high school education.   Patient drinks 1-2 cups daily.    Vitals:   03/16/16 0954  BP: 110/64  Foster: 68  Resp: 12   Body mass index is 19.99 kg/m.   Physical Exam  Nursing note and vitals reviewed. Constitutional: He is oriented to person, place, and time. He appears well-developed and well-nourished. No distress.  HENT:  Head: Atraumatic.  Mouth/Throat: Oropharynx is clear and moist and mucous membranes are normal.  Eyes: Conjunctivae and EOM are normal.  Neck: No tracheal deviation present. No thyroid mass and no thyromegaly present.  Cardiovascular: Normal  rate and regular rhythm.   No murmur heard. Pulses:      Dorsalis pedis pulses are 2+ on the right side, and 2+ on the left side.  Respiratory: Effort normal and breath sounds normal. No respiratory distress.  GI: Soft. He exhibits no mass. There is no hepatomegaly. There is no tenderness.  Musculoskeletal: He exhibits no edema or tenderness.       Lumbar back: He exhibits no tenderness.  Antalgic gait.  Lymphadenopathy:    He has no cervical adenopathy.  Neurological: He is alert and oriented to person, place, and time. He has normal strength.  Mildly ustable gait, no assisted.  SRL negative bilateral.  Skin: Skin is warm. No erythema.  Psychiatric: His mood appears anxious. Cognition and memory are normal.  Well groomed, good eye contact.      ASSESSMENT AND PLAN:     [name] was seen today for follow-up.  Diagnoses and all orders for this visit:  Chronic fatigue  We discussed possible causes, including depression and Parkinson disease. Problem could be aggravated lately by interrupted sleep related to pain.  Primary hypothyroidism  Last TSH in normal range, no changes in current management. Follow-up with lab in 3-4 months. F/U in 6 months.  -     TSH; Future  Radicular pain of right lower extremity  Increase gabapentin as instructed, from 300 mg to 600 mg. Some side effects discussed. Fall precautions also discussed. Continue following up with orthopedist.  Depression, major, recurrent, in partial remission ([place])  He is tolerating Celexa well, he agrees with trying to increase dose from 10 mg to 20 mg. Instructed to let me know in 4-6 weeks thorough my chart if he is tolerating medication well and if his anxiety improves. He has an appointment in a couple months with his neurologist,Travis Athar; so I will see him back in 6 months, before if needed.     -[name] was advised to return sooner than planned today if new concerns  arise.       Timika Muench G. [place], MD  [facility]. [place] office.

## 2016-03-16 NOTE — Patient Instructions (Signed)
A few things to remember from today's visit:   Chronic fatigue  Depression, major, recurrent, in partial remission ([place])  Primary hypothyroidism  Increase Celexa to 20 mg and monitor for changes in depressed mood, in 6 week please let me know, so we can change tab for 20 mg. Keep appointment with Dr Rexene Alberts in 06/2015. Lab in 3  Months and I will see you in 6 months.  Miralax q 2 days as needed. Fall precautions, please use a cane.   Please be sure medication list is accurate. If a new problem present, please set up appointment sooner than planned today.

## 2016-03-17 ENCOUNTER — Ambulatory Visit
Admission: RE | Admit: 2016-03-17 | Discharge: 2016-03-17 | Disposition: A | Discharge status: Home / Self Care | Payer: Medicare Other | Source: Ambulatory Visit | Attending: Orthopedic Surgery | Admitting: Orthopedic Surgery

## 2016-03-17 DIAGNOSIS — M48062 Spinal stenosis, lumbar region with neurogenic claudication: Secondary | ICD-10-CM

## 2016-03-17 DIAGNOSIS — M48061 Spinal stenosis, lumbar region without neurogenic claudication: Secondary | ICD-10-CM | POA: Diagnosis not present

## 2016-03-17 MED ORDER — DIAZEPAM 5 MG PO TABS
5.0000 mg | ORAL_TABLET | Freq: Once | ORAL | Status: AC
  Administered 2016-03-17: 5 mg via ORAL

## 2016-03-17 MED ORDER — IOPAMIDOL (ISOVUE-M 200) INJECTION 41%
15.0000 mL | Freq: Once | INTRAMUSCULAR | Status: AC
  Administered 2016-03-17: 15 mL via INTRATHECAL

## 2016-03-17 NOTE — Discharge Instructions (Signed)

## 2016-03-23 DIAGNOSIS — M5441 Lumbago with sciatica, right side: Secondary | ICD-10-CM | POA: Diagnosis not present

## 2016-03-23 DIAGNOSIS — M48062 Spinal stenosis, lumbar region with neurogenic claudication: Secondary | ICD-10-CM | POA: Diagnosis not present

## 2016-03-29 ENCOUNTER — Telehealth: Payer: Self-pay | Admitting: Family Medicine

## 2016-03-29 NOTE — Telephone Encounter (Signed)
Pts wife calling pt is unable to take Vicodin and would like to see if Dr. Martinique can send in tramadol due to him having Parkinson and is on several different kinds of Rx.  [facility] on [address] [phone].  Wife would like to have a call in the A.M.

## 2016-03-30 ENCOUNTER — Encounter: Payer: Self-pay | Admitting: Family Medicine

## 2016-03-30 NOTE — Telephone Encounter (Signed)
I do not think I have prescribed Vicodin for Mr Snide.  I could have been his orthopedist.   Caution with this type of medications, they can increase risk of falls.   Thanks, BJ

## 2016-03-31 MED ORDER — OXYCODONE-ACETAMINOPHEN 5-325 MG PO TABS: 1.0000 | ORAL_TABLET | Freq: Two times a day (BID) | ORAL | 0 refills | Status: DC | PRN

## 2016-03-31 NOTE — Telephone Encounter (Signed)
Left voicemail for patient to call the office back.   

## 2016-03-31 NOTE — Telephone Encounter (Signed)
Mychart message replied to with options. Awaiting response.

## 2016-03-31 NOTE — Telephone Encounter (Signed)
Percocet 5-325 mg to take bid as needed can be print it and pick up. He can start with 1/2 tab at the time, fall precaution. # 14/0. This is a one time Rx. Thanks, BJ

## 2016-03-31 NOTE — Telephone Encounter (Signed)
Patient called back. Patient would like to try the Oxycodone and see if that helps.

## 2016-03-31 NOTE — Telephone Encounter (Signed)
Rx printed

## 2016-03-31 NOTE — Telephone Encounter (Signed)
Called and spoke with patient. Advised Rx is ready for pick up and to start with 1/2 a tablet. Also advised that this is a one time Rx and that I could do another referral to a new ortho office if he would like. He verbalized understanding and said he would like to try the medicine first to see how it does. Rx placed up front.

## 2016-04-03 ENCOUNTER — Telehealth: Payer: Self-pay | Admitting: Family Medicine

## 2016-04-03 DIAGNOSIS — M5441 Lumbago with sciatica, right side: Secondary | ICD-10-CM | POA: Diagnosis not present

## 2016-04-03 NOTE — Telephone Encounter (Signed)
Just and FYI-Pt wanted to let you know that he started Physical Therapy on 03/27/2016

## 2016-04-06 DIAGNOSIS — M5441 Lumbago with sciatica, right side: Secondary | ICD-10-CM | POA: Diagnosis not present

## 2016-04-10 DIAGNOSIS — M5441 Lumbago with sciatica, right side: Secondary | ICD-10-CM | POA: Diagnosis not present

## 2016-04-13 ENCOUNTER — Ambulatory Visit (INDEPENDENT_AMBULATORY_CARE_PROVIDER_SITE_OTHER): Payer: Medicare Other

## 2016-04-13 DIAGNOSIS — Z23 Encounter for immunization: Secondary | ICD-10-CM | POA: Diagnosis not present

## 2016-04-13 DIAGNOSIS — M5441 Lumbago with sciatica, right side: Secondary | ICD-10-CM | POA: Diagnosis not present

## 2016-04-18 ENCOUNTER — Encounter: Payer: Self-pay | Admitting: Family Medicine

## 2016-04-18 DIAGNOSIS — M5441 Lumbago with sciatica, right side: Secondary | ICD-10-CM | POA: Diagnosis not present

## 2016-04-21 DIAGNOSIS — M5441 Lumbago with sciatica, right side: Secondary | ICD-10-CM | POA: Diagnosis not present

## 2016-04-25 DIAGNOSIS — M5441 Lumbago with sciatica, right side: Secondary | ICD-10-CM | POA: Diagnosis not present

## 2016-04-28 DIAGNOSIS — M5441 Lumbago with sciatica, right side: Secondary | ICD-10-CM | POA: Diagnosis not present

## 2016-04-29 ENCOUNTER — Other Ambulatory Visit: Payer: Self-pay | Admitting: Neurology

## 2016-04-29 DIAGNOSIS — G20 Parkinson's disease: Secondary | ICD-10-CM

## 2016-05-05 DIAGNOSIS — M5441 Lumbago with sciatica, right side: Secondary | ICD-10-CM | POA: Diagnosis not present

## 2016-05-11 DIAGNOSIS — M5441 Lumbago with sciatica, right side: Secondary | ICD-10-CM | POA: Diagnosis not present

## 2016-05-14 ENCOUNTER — Encounter: Payer: Self-pay | Admitting: Neurology

## 2016-05-15 ENCOUNTER — Telehealth: Payer: Self-pay | Admitting: Neurology

## 2016-05-15 ENCOUNTER — Other Ambulatory Visit: Payer: Self-pay | Admitting: Neurology

## 2016-05-15 MED ORDER — RASAGILINE MESYLATE 1 MG PO TABS: ORAL_TABLET | ORAL | 5 refills | Status: DC

## 2016-05-15 NOTE — Telephone Encounter (Signed)
Per email conversation: will taper [name] and start Azilect. Patient notified via email, Rx sent to [facility]. No action req.

## 2016-05-17 DIAGNOSIS — M5441 Lumbago with sciatica, right side: Secondary | ICD-10-CM | POA: Diagnosis not present

## 2016-05-23 DIAGNOSIS — M5441 Lumbago with sciatica, right side: Secondary | ICD-10-CM | POA: Diagnosis not present

## 2016-05-30 DIAGNOSIS — M5441 Lumbago with sciatica, right side: Secondary | ICD-10-CM | POA: Diagnosis not present

## 2016-06-06 ENCOUNTER — Telehealth: Payer: Self-pay

## 2016-06-06 DIAGNOSIS — M5441 Lumbago with sciatica, right side: Secondary | ICD-10-CM | POA: Diagnosis not present

## 2016-06-06 NOTE — Telephone Encounter (Signed)
Thyroid medication has been adjusted because TSH was abnormal.Last regimen : Levothyroxine 112 mcg x 5 and 1/2 tab Tuesday and Thursday.TSH was normal,I do not recommend increasing Levothyroxine dose at this time. I do not understand why another provider sent a different strength given the fact last TSH was normal under current regimen.  Thanks, BJ

## 2016-06-06 NOTE — Telephone Encounter (Signed)
Patient called to get clarification on his thyroid medication. Patient said that the PA over at [facility] had called in Levothyroxine 125 mcg. Advised patient that we still had the 112 mcg on his list, and no changes have been made since his last lab drawn and result given on 03/13/16. Patient said that he would take the 125 mcg back to the drug store and continue taking his 112 mcg as prescribed.

## 2016-06-07 NOTE — Telephone Encounter (Signed)
Patient is aware of the dosage of Levothyroxine that he should be on.

## 2016-06-08 ENCOUNTER — Other Ambulatory Visit: Payer: Self-pay | Admitting: Neurology

## 2016-06-08 DIAGNOSIS — G20 Parkinson's disease: Secondary | ICD-10-CM

## 2016-06-14 ENCOUNTER — Other Ambulatory Visit (INDEPENDENT_AMBULATORY_CARE_PROVIDER_SITE_OTHER): Payer: Medicare Other

## 2016-06-14 ENCOUNTER — Encounter: Payer: Self-pay | Admitting: Family Medicine

## 2016-06-14 DIAGNOSIS — M5136 Other intervertebral disc degeneration, lumbar region: Secondary | ICD-10-CM | POA: Diagnosis not present

## 2016-06-14 DIAGNOSIS — M5441 Lumbago with sciatica, right side: Secondary | ICD-10-CM | POA: Diagnosis not present

## 2016-06-14 DIAGNOSIS — E039 Hypothyroidism, unspecified: Secondary | ICD-10-CM | POA: Diagnosis not present

## 2016-06-14 LAB — TSH: TSH: 0.89 u[IU]/mL (ref 0.35–4.50)

## 2016-06-21 ENCOUNTER — Ambulatory Visit (INDEPENDENT_AMBULATORY_CARE_PROVIDER_SITE_OTHER): Payer: Medicare Other | Admitting: Neurology

## 2016-06-21 ENCOUNTER — Encounter: Payer: Self-pay | Admitting: Neurology

## 2016-06-21 VITALS — BP 150/78 | HR 76 | Resp 14 | Ht 68.0 in | Wt 132.0 lb

## 2016-06-21 DIAGNOSIS — G903 Multi-system degeneration of the autonomic nervous system: Secondary | ICD-10-CM | POA: Diagnosis not present

## 2016-06-21 DIAGNOSIS — M79671 Pain in right foot: Secondary | ICD-10-CM | POA: Diagnosis not present

## 2016-06-21 DIAGNOSIS — G20 Parkinson's disease: Secondary | ICD-10-CM

## 2016-06-21 NOTE — Patient Instructions (Addendum)
We will phase out of Azilect (generic name: rasagiline) 1 mg strength: take 1/2 pill each morning for 2 weeks, then stop.  If you feel worse (it may take weeks to feel a difference), it may be increase in tremor, stiffness and slowness.  Hopefully, your back injection will help soon.  We will keep you on the current doses of Requip and Sinemet.  Please drink more water . Please use your compression stockings.

## 2016-06-21 NOTE — Progress Notes (Signed)
Subjective:    Patient ID: [name] is a 71 y.o. male.  HPI     Interim history:   [name] is a very pleasant 71 year old right-handed gentleman with an underlying medical history of hypothyroidism, OSA (not using CPAP currently, but tried in the past), RLS, MCI, CTS, depression, anemia, and allergies, who presents for followup consultation of his right-sided predominant, akinetic-rigid type Parkinson's disease, complicated by sleep disorder, RLS, memory loss, prior hallucinations, prior fall with injury, depression, and syncope. He is accompanied by his wife today. I last saw him on 02/16/2016, at which time he reported feeling better. He had occasional constipation and mild memory loss, mild difficulty processing. His wife reported that he had mild difficulty swallowing. He did have a swallow evaluation in March 2016, and it was recommended that he start thickened liquids, no straw, mechanical soft dysphagia diet. He did have a fall and broke his foot. He had to wear a boot for 6 weeks. He then got sick in late October 2017 with pneumonia. His wife retired and October 2017 which is helped. He had switch from Azilect to [place] successfully, noted no side effects, and it was cheaper. He continued on Sinemet one pill 4 times a day.  I suggested we continue with his medication, but increase his Sinemet to 1 pill 5 times a day, starting at 7 AM with 3 hourly schedule. He emailed in the interim in March 2018 requesting to switch back to Azilect. I changed his prescription. We repeated a swallow study in the interim on 02/24/2016. The swallow study results indicate moderate risk for aspiration, fairly unchanged findings from last time, in 2016. Recs are, per speech pathologist: Mech soft solids, meaning chopped up foods, small pieces, does not have to be purreed. Thin liquids through cup, no straw. Compensation techniques: slow rate, small sips and bites, multiple dry swallows after each bite  or sip, follow solids with liquid to help food get down, clear throat intermittently. Remain semi-upright after meals, sit upright at 90 degrees for eating and drinking. Furthermore, I recommended: no "multitasking" while eating, as in, no talking, reading, playing, watching TV. Focus on the task of eating or drinking only.    We called him with the test results.  Today, 06/21/2016: He reports 2 falls, both times in the bathroom, back to back. Wife helped him, he felt lightheaded, doesn't admit not using his compression stockings. Does not always drink enough water. He is not sure if increasing Sinemet to 1 pill 5 times a day has helped. He is not sure if Azilect has been helpful. He has a chronic cough and raspiness in his throat. He has some postnasal drip, some allergy symptoms, some drainage. He has a lot of drooling at night. He has started sleeping in the down on the sofa which helps his nighttime drooling. He does not sleep well at night and is more sleepy during the day. He dozes off easily per wife. His memory is mildly impaired, he has been more forgetful. He has not fallen other than those 2 times in the bathroom back to back. Thankfully, no injuries. He has become slower. His back has been hurting, he has residual right foot pain from his fracture. He has seen Dr. Nelva Bush and is scheduled for a epidural steroid injection in 2 weeks from now. He has tried gabapentin in the past but had side effects. He has a history of scoliosis. He also has lumbar spinal stenosis. He has not been  exercising as much because of his foot pain and fractured foot. He used to walk several miles a day. His Azilect is too expensive for him to fill 90 days at a time. He has to pay over $400 for 3 months supply.   The patient's allergies, current medications, family history, past medical history, past social history, past surgical history and problem list were reviewed and updated as appropriate.    Previously:     I  saw him on 08/25/2015, at which time he reported not feeling good with the Florinef and he stopped it. Wife did notice more memory issues, he had occasional constipation, no hallucinations. He did fall out of bed in March 2017. He improved with regards to feeling bad after he stopped the Florinef. I suggested we continue with his medications. He emailed me in the interim in August 2017 and requested to start [place], which I called in and we stopped the Azilect at the time. In August he also requested to restart Florinef which I started low dose at half a pill in the late morning.       I saw him on 04/19/2015, at which time he reported having occasional lightheadedness upon standing. He had low energy and felt physically exhausted at times. Motor-wise, overall, he felt stable, mood wise, he felt that Celexa was helpful. He had no recent syncopal spells. He had lost a little bit of weight. He had not been using compression stockings.   I saw him on 12/14/2014, at which time he reported feeling more sedated. His wife noted more word finding difficuties, more forgetfulness, more fatigued, less physically active. He felt more depressed and had voiced suicidal ideations in the past but denied them at the time or recently and had no intent. He has a history of a reaction to Lamictal in the past. Is more depressed has voiced SI in the past, but not recently and no recent intent. He had a reaction to lamictal in the past. He has abnormal posturing of his right upper extremity while walking and sitting sometimes. He has trouble maintaining sleep. He tried the gabapentin but did not feel good on it so he stopped it. Melatonin has not helped his sleep. I suggested that we keep his Sinemet at one pill 4 times a day, Requip 2 mg strength half a pill 3 times a day and 1-1/2 pills at bedtime, Azilect once daily, and for mood I suggested trial of Celexa, 10 mg each night.    I saw him on 08/14/2014, at which time he  reported more fatigue over the past 2 months. He reported having more stiffness in the afternoon. He received a B12 injection recently through his primary care physician. His B12 level was 428 at the time. His TSH was normal. He reported trouble staying asleep and melatonin was not helpful. He was drinking more water and had had no recent lightheadedness or fainting spell and no falls. He had some low back pain. He had a lumbar spine x-ray on 07/12/2014: Lumbar spine scoliosis with associated degenerative changes. I suggested we increase his Sinemet to 4 times a day and keep his ropinirole is same. I suggested low-dose gabapentin at night for his back pain and for sleep.   I saw him on 04/15/2014, at which time he reported feeling fine. He had no lightheadedness. He had not been using his compression stockings and in fact had not bought them yet. He did admit that he was not drinking enough water,  estimating about 1 glass per day. He said he would walk 6 miles a day him a typically with his wife. I suggested he drink more water. He was advised to start using compression stockings every day, not at night. He was advised to monitor his blood pressure. For sleep I suggested he try melatonin. I suggested a one-month follow-up with our nurse practitioner. I kept his medications the same. In the interim, he was seen by Ward Givens, NP on 05/14/2014. He was felt to be stable. He had no new complaints. He was advised to continue to stay well-hydrated and use compression stockings. He was advised to continue taking Azilect, Sinemet and Requip.     I saw him on 05/12/13, at which time he reported lightheadedness and his BP was in fact low. He denies any recent syncopal spells and no falls. He stopped Toviaz some 4 months prior (due to cost). He denied recent illness and recent medication changes. He had some word finding difficulty. He was driving and had not had recent issues driving. He had no constipation or  hallucinations and was sleeping well.   In the interim, he was seen by our nurse practitioner, Ms. Clabe Seal on 11/13/2013, at which time he was stable, his blood pressure was stable, and he was kept on the same medications including Azilect, Sinemet and Requip.   In the interim, he was hospitalized from 03/30/2014 through 04/01/2014 for a syncopal spell. He had a syncopal event at home on 03/30/2014 and found himself down after having a warning sign of dizziness, and sweating around 6:54 PM. He went to see his primary care physician and was advised to come in to the emergency room. He had another presyncopal spell the same day. I reviewed the hospital records including the discharge summary. He had a echocardiogram on 03/31/2014 which showed mild tricuspid regurgitation, EF of 65-70%, no regional wall motion abnormalities, mild aortic regurgitation, mild mitral regurgitation, and mild concentric left ventricular hypertrophy. Carotid Doppler studies from 04/01/2014 showed: 1-39% internal carotid artery stenosis bilaterally. There was antegrade vertebral artery flow. He had a head CT without contrast on 03/30/2014: 1. Negative intracranial imaging. 2. Remote subcondylar left mandible fracture with chronic TMJ dislocation. In addition, I personally reviewed the images through the PACS system. He was discharged home. He was seen by his cardiologist on 04/09/2014, and did not have any orthostatic blood pressure drops. He had had systolic blood pressures in the 160s in the hospital. His blood pressure at the time of his office visit with Dr. Debara Pickett was 132/87. Dr. Debara Pickett felt that he would not be a good candidate for midodrine in the context of no significant orthostatic blood pressure drop and normal blood pressure values at baseline. He emailed me regarding a trial of Droxidopa. The patient was advised to start using compression stockings which were previously recommended to him but he did not follow through with  that.   I saw him on 11/12/2012, at which time he presented after a recent syncopal spell. I felt at the time that the syncope was secondary to a combination of things including autonomic dysregulation in the context of Parkinson's disease, exposure to heat for prolonged period of time with dehydration and quick changes in position as well as potential medication side effects. He had a head CT which was negative. He also had recent blood work. I did not change his medications at the time but talk to him about supportive measures including changing positions slowly, avoid prolonged heat  exposure, staying well hydrated and using a stool for working in the yard and taking frequent breaks when working outside.    He had an event of LOC on 10/30/12. He reported that he had not been feeling well for the past 2 weeks prior to his syncope. He had been feeling dizzy especially with change of position with intermittent nausea reported. He had been in his yard working for several hours and became dizzy and had a blackout spell. He fell into the bushes, and came to after some seconds. He was bending up and down at lot. After he regained consciousness, he was feeling weak and eventually was able to go to the porch, where he sat for about 45 minutes. His wife was at work. He had some slight confusion and lack of energy, but denied any focal weakness/CP/palpitations/SOB. He did not have any prior syncopal spells. He had blood work at the Urgent Care and saw Dr. Tamala Julian on 10/31/2012. His BUN was borderline at 24, he had a CTH on 10/31/12, which was negative and yesterday went for cardiological consultation with Dr. Debara Pickett, who felt, that he had a vasovagal event in the context of dysautonomia with PD.   I first met him on 06/05/12, at which time I felt his Parkinson's disease was stable and I did not make any medication changes.   He previously followed with Dr. Morene Antu and was last seen by him on 02/05/2012, at which time he  was complaining of right hip pain. No medication changes were done at the time. He has been on Synthroid, Sinemet 25/100 mg strength one tablet 3 times a day, Requip 2 mg strength half a pill at 6:30, half a pill at noon, half a pill at 5 PM and one and a half a pill at 9 PM. He is on rasagiline 1 mg once daily, baby aspirin, multivitamin, Toviaz.   He received a steroid injection into the R hip in 8/14, which helped the pain. He worked for [employer]. He lives with his wife in a one storey at [facility] They have 2 grown children, son and daughter, both local. Two MAs and 4 cousins had Huntington's and he has been tested and was negative for it. Dr. Erling Cruz arranged the genetic testing. He has occasional depressive Sx, but better when he can get out and walk, he walks about 6 miles/day.   He was diagnosed with Parkinson's disease in July 2009, with symptoms going back a year prior and consisted of trouble with speech and eating. He was initially started on Requip, Azilect and and Sinemet. He had side effects from the Requip including excessive spending, daytime sleepiness and hypersexuality. His Requip dose was therefore decreased. He exercises regularly. He is independent in his ADLs. He has had visual hallucinations. He fell in October 2011 fracturing his left distal radius, requiring surgery in December 2011. He had right-sided leg cramps improved with Flexeril, but stopped the medication. He denied leg edema, or compulsive behavior. He has bladder incontinence. In December 2013 his MMSE was 29, clock drawing was poor and animal fluency was 17, geriatric depression scale was 8/15.      His Past Medical History Is Significant For: Past Medical History:  Diagnosis Date  . Allergy   . Anemia   . Depression   . Parkinson's disease ([place])   . Parkinson's disease ([place]) 06/05/2012  . Raynaud's syndrome   . Thyroid disease     His Past Surgical History Is Significant  For: Past Surgical History:   Procedure Laterality Date  . APPENDECTOMY    . FRACTURE SURGERY     jaw  . HERNIA REPAIR    . MANDIBLE FRACTURE SURGERY    . ROTATOR CUFF REPAIR    . TONSILLECTOMY    . VASECTOMY    . WRIST FRACTURE SURGERY      His Family History Is Significant For: Family History  Problem Relation Age of Onset  . Cancer Father     colon    His Social History Is Significant For: Social History   Social History  . Marital status: Married    Spouse name: [name]  . Number of children: 2  . Years of education: 12+   Occupational History  . DISABILITY Disability    due to Parkinson's   Social History Main Topics  . Smoking status: Never Smoker  . Smokeless tobacco: Never Used  . Alcohol use 0.0 - 4.2 oz/week     Comment: occas.  . Drug use: No  . Sexual activity: No   Other Topics Concern  . None   Social History Narrative   Patient is married [name])  with 2 children.   Patient is right handed.   Patient has high school education.   Patient drinks 1-2 cups daily.    His Allergies Are:  Allergies  Allergen Reactions  . Lamictal [Lamotrigine] Other (See Comments)    Blisters in mouth  . Nsaids Other (See Comments)    Bleeding in stool   . Penicillins Rash    .Marland KitchenHas patient had a PCN reaction causing immediate rash, facial/tongue/throat swelling, SOB or lightheadedness with hypotension: No Has patient had a PCN reaction causing severe rash involving mucus membranes or skin necrosis: No Has patient had a PCN reaction that required hospitalization No Has patient had a PCN reaction occurring within the last 10 years: No If all of the above answers are "NO", then may proceed with Cephalosporin use.   :   His Current Medications Are:  Outpatient Encounter Prescriptions as of 06/21/2016  Medication Sig  . aspirin 81 MG tablet Take 160 mg by mouth daily.  . carbidopa-levodopa (SINEMET IR) 25-100 MG tablet Take 1 tablet by mouth 5 (five) times daily.  Marland Kitchen levothyroxine (SYNTHROID,  LEVOTHROID) 112 MCG tablet Take 1 tablet daily  . Multiple Vitamins-Minerals (MULTIVITAMIN WITH MINERALS) tablet Take 1 tablet by mouth daily.  . rasagiline (AZILECT) 1 MG TABS tablet TAKE 1/2 TABLET BY MOUTH EVERY DAY FOR 1 WEEK, THEN 1 TABLET EVERY DAY. START AFTER STOPPING XADAGO (Patient taking differently: 1/2 tab in am, 1/2 tan at noon, 1/2 tab at dinner, 1.5 tab at bedtime)  . rOPINIRole (REQUIP) 2 MG tablet TAKE 1/2 TABLET BY MOUTH AT 6:30 AM, 1/2 TABLET AT NOON, 1/2 TABLET AT 5:00 PM, AND 1 AND 1/2 TABLETS AT 8:00 PM  . [DISCONTINUED] citalopram (CELEXA) 10 MG tablet TAKE 1 TABLET(10 MG) BY MOUTH DAILY  . [DISCONTINUED] fludrocortisone (FLORINEF) 0.1 MG tablet Take 0.5 tablets (0.05 mg total) by mouth daily. Take late morning around 10 AM  . [DISCONTINUED] oxyCODONE-acetaminophen (ROXICET) 5-325 MG tablet Take 1 tablet by mouth 2 (two) times daily as needed for severe pain. Start with 1/2 tablet at a time.  . [DISCONTINUED] Safinamide Mesylate (XADAGO) 50 MG TABS Take 50 mg by mouth daily.   Facility-Administered Encounter Medications as of 06/21/2016  Medication  . cyanocobalamin ((VITAMIN B-12)) injection 1,000 mcg  :  Review of Systems:  Out of a  complete 14 point review of systems, all are reviewed and negative with the exception of these symptoms as listed below: Review of Systems  Neurological:       Wife reports that patient is having trouble sleeping, problems with restless legs, trouble swallowing, and constant coughing.     Objective:  Neurologic Exam  Physical Exam Physical Examination:   Vitals:   06/21/16 1037  BP: (!) 150/78  Pulse: 76  Resp: 14    General Examination: The patient is a very pleasant 71 y.o. male in no acute distress. He appears thin and mildly deconditioned. He has actually gained a little bit of weight since last time. He is well groomed.    HEENT: Normocephalic, atraumatic, pupils are equal, round and reactive to light and accommodation.  Extraocular tracking shows mild to moderate saccadic breakdown without nystagmus noted. There is mild limitation to upper gaze. He has decreased eye blink rate. Hearing is grossly intact. He has moderate facial masking. He has no dysarthria but moderate hypophonia. He does have multiple louder swallowing episodes. Neck is moderate to severely rigid. Airway examination is unchanged. He has minimal drooling, unchanged.   Chest: is clear to auscultation without wheezing, rhonchi or crackles noted.  Heart: sounds are regular and normal without murmurs, rubs or gallops noted.   Abdomen: is soft, non-tender and non-distended with normal bowel sounds appreciated on auscultation.  Extremities: There is no pitting edema in the distal lower extremities bilaterally. Pedal pulses are intact.  Skin: is warm and dry with no trophic changes noted.  Musculoskeletal: exam reveals no obvious joint deformities, tenderness or joint swelling or erythema, reports right foot pain and right-sided low back pain radiating to the right hip.  Neurologically:  Mental status: The patient is awake and alert, paying good attention. He is able to provide the history. His wife supplements with details. He is oriented to: person, place, time/date, situation, day of week, month of year and year. His memory, attention, language and knowledge are fairly well-preserved. He does have a mild degree of bradyphrenia. Speech is moderately hypophonic with  no significant dysarthria noted. Mood is congruent and affect is slightly flat.  Cranial nerves are as described above under HEENT exam. In addition, shoulder shrug is normal with equal shoulder height noted.  Motor exam: Thin bulk, global strength of 5 out of 5, tone is mildly increased on the left and more pronounced on the right. He has moderate bradykinesia. He has no significant resting tremor. He has some dystonic posturing of the right arm and hand at times, no dyskinesias are  noted. Romberg is not tested today for safety. Reflexes are 1+ throughout. Fine motor skills are moderately impaired, worse on the right.  Cerebellar testing shows no dysmetria or intention tremor on finger to nose testing. There is no truncal or gait ataxia.  Sensory exam is intact to light touch in the UEs and LEs bilaterally.  Gait, station and balance: He stands up from the seated position with mild difficulty and needs no assistance. He does not feel lightheaded upon standing. No veering to one side is noted. He is noted to lean slightly to the R side. Posture is mild to moderately stooped. No lightheadedness reported. Stance is narrow-based. He walks with decrease in stride length and pace and near absent arm swing on the right and mild to moderated dystonic posturing with the right arm and hand. He turns in 3 steps and there is slight insecurity with turn. Balance is mildly  impaired.   Assessment and Plan:   In summary, [name] is a very pleasant 71 year old male with a history of right-sided predominant Parkinson's disease of the akinetic-rigid type, complicated by a syncopal spells (September 2014 and February 2016), for which he had full workup and has also seen cardiology (last seen by Dr. Debara Pickett in 3/16 with prn FU recommended). He may very well have Parkinson's related autonomic dysregulation which often results in supine hypertension at times and sudden drops in blood pressure and falls and often some injuries. He had restarted Florinef in August 2017 but took it for about a month only. We started Florinef in February 2017 but he stopped it due to side effects at the time. We switched him from generic Azilect to Xadago 50 mg once daily, But switch back in March 2 generic Azilect. Unfortunately, this is quite costly for him. We mutually agreed to try to phase out of generic Azilect at this time. To that end, he will take half a pill daily for 2 weeks then stop. He is reminded to start  using his compression stockings and we have previously talked about gait safety quite a bit. He is advised to be better hydrated with water. He has been on Sinemet 1 pill 5 times a day which we will continue. He will continue with Requip. Unfortunately, he is not sleeping very well at night. We talked about aspiration risk and his swallow study results from January 2018. We talked about the recommendations again today. He is advised to continue with  generic Requip at the current dose, I would avoid increasing it for fear of making his daytime somnolence worse. We talked about some other alternative treatments to Sinemet generic including Rytary or Duopa. He is encouraged to consider enrolling in the boxing class for Parkinson's disease. He is advised to stay active physically. Unfortunately, he had a significant setback with his fractured right foot and residual pain. He is scheduled for a lower back injection in the next couple weeks which may help as well. We may try him on low-dose gabapentin in the future. I would like to see him back in 4 months, sooner as needed. I answered all the questions today and the patient and his wife were in agreement.  I spent 40 minutes in total face-to-face time with the patient, more than 50% of which was spent in counseling and coordination of care, reviewing test results, reviewing medication and discussing or reviewing the diagnosis of PD, its prognosis and treatment options. Pertinent laboratory and imaging test results that were available during this visit with the patient were reviewed by me and considered in my medical decision making (see chart for details).

## 2016-07-04 DIAGNOSIS — M5136 Other intervertebral disc degeneration, lumbar region: Secondary | ICD-10-CM | POA: Diagnosis not present

## 2016-07-18 DIAGNOSIS — M5136 Other intervertebral disc degeneration, lumbar region: Secondary | ICD-10-CM | POA: Diagnosis not present

## 2016-07-18 DIAGNOSIS — M48062 Spinal stenosis, lumbar region with neurogenic claudication: Secondary | ICD-10-CM | POA: Diagnosis not present

## 2016-08-08 DIAGNOSIS — M48062 Spinal stenosis, lumbar region with neurogenic claudication: Secondary | ICD-10-CM | POA: Diagnosis not present

## 2016-08-22 DIAGNOSIS — M5441 Lumbago with sciatica, right side: Secondary | ICD-10-CM | POA: Diagnosis not present

## 2016-08-22 DIAGNOSIS — M5136 Other intervertebral disc degeneration, lumbar region: Secondary | ICD-10-CM | POA: Diagnosis not present

## 2016-08-22 DIAGNOSIS — M48062 Spinal stenosis, lumbar region with neurogenic claudication: Secondary | ICD-10-CM | POA: Diagnosis not present

## 2016-09-13 ENCOUNTER — Ambulatory Visit: Payer: Medicare Other | Admitting: Family Medicine

## 2016-09-15 DIAGNOSIS — H5203 Hypermetropia, bilateral: Secondary | ICD-10-CM | POA: Diagnosis not present

## 2016-09-17 NOTE — Progress Notes (Signed)
HPI:  [name] is a 71 y.o.male here today with his wife for his routine physical examination. He is also due for Medicare subsequent preventive visit.  He was last seen on 03/16/16. Last CPE/preventive visit on 11/10/15.  Regular exercise 3 or more times per week: Not regularly. Following a healthy diet: Yes.  Chronic medical problems: Parkinson disease,chronic fatigue,depression, hypothyroidism, OSA (not on CPAP), insomnia, MCI,and constipation among some.  Providers he sees regularly:  Dr Rexene Alberts, last seen on 06/21/16. Eye care provider: Dr Thom Chimes, he follows annually. Ortho, Dr Nelva Bush  He lives with his wife. ADL's and ADL's reported as independent. His balance has improved greatly with changes in Parkinson's medications. He has mildly unstable gait, does not feel like he needs a cane. He drives short distances, wife drives most of the time.   2 falls in the past few months, both in the bathroom at home.  Fall Risk  09/19/2016 11/10/2015 09/24/2015 08/25/2015 05/18/2015  Falls in the past year? Yes No No Yes Yes  Number falls in past yr: 2 or more - - 1 2 or more  Injury with Fall? No - - No Yes  Risk Factor Category  High Fall Risk - - - -  Risk for fall due to : - - - Impaired balance/gait -  Follow up Falls evaluation completed - - Falls prevention discussed -    Depression screen [facility] 2/9 09/19/2016  Decreased Interest 0  Down, Depressed, Hopeless 0  PHQ - 2 Score 0  Altered sleeping -  Tired, decreased energy -  Change in appetite -  Feeling bad or failure about yourself  -  Trouble concentrating -  Moving slowly or fidgety/restless -  Suicidal thoughts -  PHQ-9 Score -  Difficult doing work/chores -   Functional Status Survey: Is the patient deaf or have difficulty hearing?: Yes Does the patient have difficulty seeing, even when wearing glasses/contacts?: No Does the patient have difficulty concentrating, remembering, or making decisions?:  Yes Does the patient have difficulty walking or climbing stairs?: Yes Does the patient have difficulty dressing or bathing?: No ([place] ) Does the patient have difficulty doing errands alone such as visiting a doctor's office or shopping?: No   Hearing Screening   125Hz  250Hz  500Hz  1000Hz  2000Hz  3000Hz  4000Hz  6000Hz  8000Hz   Right ear:   Fail Pass Pass  Fail    Left ear:   Pass Pass Pass  Pass         Immunization History  Administered Date(s) Administered  . Hepatitis B, adult 03/13/2016, 04/13/2016  . Influenza Split 11/20/2011  . Influenza,inj,Quad PF,36+ Mos 12/04/2012, 03/31/2014, 10/21/2014, 11/10/2015  . Pneumococcal Conjugate-13 11/26/2007, 05/18/2015  . Pneumococcal Polysaccharide-23 09/19/2016  . Pneumococcal-Unspecified 02/20/2009  . Tdap 06/20/2008, 12/04/2012  Zoster vaccine at [facility].    -Hep C screening: 12/2015 negative.  Last colon cancer screening: 2016 and he is not interested in continuing screening for colon cancer. Last prostate ca screening: 10/2015.  Lab Results  Component Value Date   PSA 2.2 11/10/2015   PSA 2.07 04/16/2014   PSA 1.73 06/18/2013  Nocturia x 1 and occasional urgency, stable for years. He denies gross hematuria or dysuria.   -Denies high alcohol intake, tobacco use, or Hx of illicit drug use.  Lab Results  Component Value Date   CHOL 179 04/16/2014   HDL 69 04/16/2014   LDLCALC 99 04/16/2014   TRIG 55 04/16/2014   CHOLHDL 2.6 04/16/2014    -Concerns  and/or follow up today:   Hypothyroidism:  Currently he is on Levothyroxine . Tolerating medication well, no side effects reported. He has not noted palpitations, abdominal pain, changes in bowel habits,cold/heat intolerance, or abnormal weight loss.  Lab Results  Component Value Date   TSH 0.89 06/14/2016   His wife is requesting referral to neurosurgeon for evaluation of lower back pain. According to wife,"he is miserable" due to pain. Dx with spinal stenosis, he  has received 2 epidural injections, which helped "lttle." Right lower back pain radiated to RLE, severe most of the time. + Numbness,tingling,and burning sensation. He denies bowel, urine incontinence, or saddle anesthesia.  Depression, he denies symptoms. Currently he is on Celexa. He denies suicidal thoughts.  Review of Systems  Constitutional: Positive for fatigue. Negative for appetite change, diaphoresis and fever.  HENT: Negative for dental problem, nosebleeds, sore throat, trouble swallowing and voice change.   Eyes: Negative for redness and visual disturbance.  Respiratory: Negative for cough, shortness of breath and wheezing.   Cardiovascular: Negative for chest pain, palpitations and leg swelling.  Gastrointestinal: Negative for abdominal pain, blood in stool, nausea and vomiting.       No changes in bowel habits.  Endocrine: Negative for cold intolerance, heat intolerance, polydipsia, polyphagia and polyuria.  Genitourinary: Negative for decreased urine volume, dysuria, genital sores, hematuria and testicular pain.  Musculoskeletal: Positive for back pain and gait problem. Negative for joint swelling.  Skin: Negative for color change and rash.  Neurological: Positive for numbness. Negative for syncope, weakness and headaches.  Hematological: Negative for adenopathy. Does not bruise/bleed easily.  Psychiatric/Behavioral: Positive for sleep disturbance. Negative for confusion and suicidal ideas. The patient is nervous/anxious.   All other systems reviewed and are negative.    Current Outpatient Prescriptions on File Prior to Visit  Medication Sig Dispense Refill  . aspirin 81 MG tablet Take 160 mg by mouth daily.    . carbidopa-levodopa (SINEMET IR) 25-100 MG tablet Take 1 tablet by mouth 5 (five) times daily. 450 tablet 3  . Multiple Vitamins-Minerals (MULTIVITAMIN WITH MINERALS) tablet Take 1 tablet by mouth daily.    . rasagiline (AZILECT) 1 MG TABS tablet TAKE 1/2 TABLET BY  MOUTH EVERY DAY FOR 1 WEEK, THEN 1 TABLET EVERY DAY. START AFTER STOPPING XADAGO (Patient taking differently: 1/2 tab in am, 1/2 tan at noon, 1/2 tab at dinner, 1.5 tab at bedtime) 90 tablet 1  . rOPINIRole (REQUIP) 2 MG tablet TAKE 1/2 TABLET BY MOUTH AT 6:30 AM, 1/2 TABLET AT NOON, 1/2 TABLET AT 5:00 PM, AND 1 AND 1/2 TABLETS AT 8:00 PM 270 tablet 3   Current Facility-Administered Medications on File Prior to Visit  Medication Dose Route Frequency Provider Last Rate Last Dose  . cyanocobalamin ((VITAMIN B-12)) injection 1,000 mcg  1,000 mcg Intramuscular Q30 days Orma Flaming, MD   1,000 mcg at 07/28/14 1344     Past Medical History:  Diagnosis Date  . Allergy   . Anemia   . Depression   . Parkinson's disease ([place])   . Parkinson's disease ([place]) 06/05/2012  . Raynaud's syndrome   . Thyroid disease    Past Surgical History:  Procedure Laterality Date  . APPENDECTOMY    . FRACTURE SURGERY     jaw  . HERNIA REPAIR    . MANDIBLE FRACTURE SURGERY    . ROTATOR CUFF REPAIR    . TONSILLECTOMY    . VASECTOMY    . WRIST FRACTURE SURGERY  Allergies  Allergen Reactions  . Lamictal [Lamotrigine] Other (See Comments)    Blisters in mouth  . Nsaids Other (See Comments)    Bleeding in stool   . Penicillins Rash    .Marland KitchenHas patient had a PCN reaction causing immediate rash, facial/tongue/throat swelling, SOB or lightheadedness with hypotension: No Has patient had a PCN reaction causing severe rash involving mucus membranes or skin necrosis: No Has patient had a PCN reaction that required hospitalization No Has patient had a PCN reaction occurring within the last 10 years: No If all of the above answers are "NO", then may proceed with Cephalosporin use.     Family History  Problem Relation Age of Onset  . Cancer Father        colon    Social History   Social History  . Marital status: Married    Spouse name: [name]  . Number of children: 2  . Years of education: 12+    Occupational History  . DISABILITY Disability    due to Parkinson's   Social History Main Topics  . Smoking status: Never Smoker  . Smokeless tobacco: Never Used  . Alcohol use 0.0 - 4.2 oz/week     Comment: occas.  . Drug use: No  . Sexual activity: No   Other Topics Concern  . None   Social History Narrative   Patient is married [name])  with 2 children.   Patient is right handed.   Patient has high school education.   Patient drinks 1-2 cups daily.     Vitals:   09/19/16 0812  BP: 112/74  Pulse: 64  Resp: 12   Body mass index is 19.33 kg/m.  O2 sat at RA 93%  Wt Readings from Last 3 Encounters:  09/19/16 127 lb 2 oz (57.7 kg)  06/21/16 132 lb (59.9 kg)  03/16/16 131 lb 8 oz (59.6 kg)     Physical Exam  Nursing note and vitals reviewed. Constitutional: He is oriented to person, place, and time. He appears well-developed. No distress.  HENT:  Head: Atraumatic.  Right Ear: Tympanic membrane and ear canal normal.  Left Ear: Tympanic membrane and ear canal normal.  Mouth/Throat: Oropharynx is clear and moist and mucous membranes are normal.  Mild hearing loss right.  Eyes: Pupils are equal, round, and reactive to light. Conjunctivae are normal.  Neck: Normal range of motion. No thyromegaly present.  Cardiovascular: Normal rate and regular rhythm.   No murmur heard. Pulses:      Dorsalis pedis pulses are 2+ on the right side, and 2+ on the left side.  Respiratory: Effort normal and breath sounds normal. No respiratory distress.  GI: Soft. He exhibits no mass. There is no tenderness.  Genitourinary:  Genitourinary Comments: Deferred,no concerns.  Musculoskeletal: He exhibits no edema or tenderness.       Lumbar back: He exhibits deformity. He exhibits no tenderness and no bony tenderness.  No major deformities appreciated and no signs of synovitis.  Lymphadenopathy:    He has no cervical adenopathy.       Right: No supraclavicular adenopathy present.        Left: No supraclavicular adenopathy present.  Neurological: He is alert and oriented to person, place, and time. He has normal strength. No cranial nerve deficit (grossly intact) or sensory deficit.  Symmetric DTR's (patellar and biceps) 1+. Tremor is not appreciated today. Otherwise stable,slow gait with no assistance.  Skin: Skin is warm. No erythema.  Psychiatric: Cognition and memory are  normal. He does not exhibit a depressed mood.  Flat affect.     ASSESSMENT AND PLAN:   [name] was seen today for annual exam.  Diagnoses and all orders for this visit:  Lab Results  Component Value Date   TSH 0.24 (L) 09/19/2016   Lab Results  Component Value Date   CHOL 213 (H) 09/19/2016   HDL 65.60 09/19/2016   LDLCALC 129 (H) 09/19/2016   TRIG 91.0 09/19/2016   CHOLHDL 3 09/19/2016   Lab Results  Component Value Date   CREATININE 0.90 09/19/2016   BUN 18 09/19/2016   NA 141 09/19/2016   K 4.6 09/19/2016   CL 105 09/19/2016   CO2 28 09/19/2016    Routine general medical examination at a health care facility  We discussed the importance of regular physical activity and healthy diet for prevention of chronic illness and/or complications. Preventive guidelines reviewed.  Aspirin 81 mg to continue, some side effects discussed.  Next CPE in 1 year.  Medicare annual wellness visit, subsequent  We discussed the importance of staying active, physically and mentally, as well as the benefits of a healthy/balance diet. Low impact exercise that involve stretching and strengthing are ideal. Vaccines updated. We discussed preventive screening for the next 5-10 years, summery of recommendations given in AVS:  He is not interested in further screening testing: Prostate and colon. Periodic eye examination to continue:Glaucoma screening. Diabetes screening. Fall prevention.  Advance directives and end of life discussed, he has POA and living will.    Primary  hypothyroidism  No changes in current management, will follow labs done today and will give further recommendations accordingly. F/U according to lab results.  -     Basic metabolic panel -     Lipid panel -     T4, free -     TSH -     levothyroxine (SYNTHROID, LEVOTHROID) 112 MCG tablet; Take 1 tablet daily except take 1/2 tablet on Tuesday & Thursday.  Lumbar back pain with radiculopathy affecting right lower extremity  Referral to neurosurgeon placed as requested. His wife would like Dr Ellene Route.  -     Ambulatory referral to Neurosurgery  Need for 23-polyvalent pneumococcal polysaccharide vaccine -     Pneumococcal polysaccharide vaccine 23-valent greater than or equal to 2yo subcutaneous/IM  Parkinson's disease ([place])  Reporting improvement with current regimen. Continue following with Dr Rexene Alberts.  Depression, major, recurrent, in partial remission ([place])  Well controlled. No changes in current management. He is also following with Dr Rexene Alberts.    Return in 1 year (on 09/19/2017) for routine,.    Betty G. Martinique, MD  [facility]. [place] office.

## 2016-09-19 ENCOUNTER — Encounter: Payer: Self-pay | Admitting: Family Medicine

## 2016-09-19 ENCOUNTER — Ambulatory Visit: Payer: Medicare Other | Admitting: Family Medicine

## 2016-09-19 ENCOUNTER — Ambulatory Visit (INDEPENDENT_AMBULATORY_CARE_PROVIDER_SITE_OTHER): Payer: Medicare Other | Admitting: Family Medicine

## 2016-09-19 VITALS — BP 112/74 | HR 64 | Resp 12 | Ht 68.0 in | Wt 127.1 lb

## 2016-09-19 DIAGNOSIS — M5417 Radiculopathy, lumbosacral region: Secondary | ICD-10-CM

## 2016-09-19 DIAGNOSIS — F3341 Major depressive disorder, recurrent, in partial remission: Secondary | ICD-10-CM | POA: Diagnosis not present

## 2016-09-19 DIAGNOSIS — G20 Parkinson's disease: Secondary | ICD-10-CM

## 2016-09-19 DIAGNOSIS — Z23 Encounter for immunization: Secondary | ICD-10-CM

## 2016-09-19 DIAGNOSIS — E039 Hypothyroidism, unspecified: Secondary | ICD-10-CM | POA: Diagnosis not present

## 2016-09-19 DIAGNOSIS — M5416 Radiculopathy, lumbar region: Secondary | ICD-10-CM

## 2016-09-19 DIAGNOSIS — Z0000 Encounter for general adult medical examination without abnormal findings: Secondary | ICD-10-CM | POA: Diagnosis not present

## 2016-09-19 LAB — BASIC METABOLIC PANEL
BUN: 18 mg/dL (ref 6–23)
CHLORIDE: 105 meq/L (ref 96–112)
CO2: 28 meq/L (ref 19–32)
CREATININE: 0.9 mg/dL (ref 0.40–1.50)
Calcium: 9.7 mg/dL (ref 8.4–10.5)
GFR: 89 mL/min (ref >60.00)
Glucose, Bld: 81 mg/dL (ref 70–99)
Potassium: 4.6 mEq/L (ref 3.5–5.1)
Sodium: 141 mEq/L (ref 135–145)

## 2016-09-19 LAB — LIPID PANEL
CHOL/HDL RATIO: 3
CHOLESTEROL: 213 mg/dL — AB (ref 0–200)
HDL: 65.6 mg/dL (ref >39.00)
LDL CALC: 129 mg/dL — AB (ref 0–99)
NonHDL: 147.21
Triglycerides: 91 mg/dL (ref 0.0–149.0)
VLDL: 18.2 mg/dL (ref 0.0–40.0)

## 2016-09-19 LAB — T4, FREE: FREE T4: 0.94 ng/dL (ref 0.60–1.60)

## 2016-09-19 LAB — TSH: TSH: 0.24 u[IU]/mL — AB (ref 0.35–4.50)

## 2016-09-19 NOTE — Patient Instructions (Signed)
A few things to remember from today's visit:   Routine general medical examination at a health care facility  Primary hypothyroidism - Plan: Basic metabolic panel, Lipid panel, T4, free, TSH  Lumbar back pain with radiculopathy affecting right lower extremity - Plan: Ambulatory referral to Neurosurgery  For the next 5-10 years you would need the following preventive measures:   At least 150 minutes of moderate exercise per week, daily brisk walking for 15-30 min is a good exercise option. Healthy diet low in saturated (animal) fats and sweets and consisting of fresh fruits and vegetables, lean meats such as fish and white chicken and whole grains.  - Vaccines:   Pneumonia vaccines:  Today last Pneumovax.   -Screening recommendations for low/normal risk males:  Screening for diabetes at age 25-45 and every 3 years.    Lipid screening at 35 and every 3 years.   Colonoscopy for colon cancer screening at age 79 and until age 69.  Prostate cancer screening: some controversy.   Eye exam annually.  Fall prevention.       Please be sure medication list is accurate. If a new problem present, please set up appointment sooner than planned today.

## 2016-09-23 ENCOUNTER — Encounter: Payer: Self-pay | Admitting: Family Medicine

## 2016-09-23 ENCOUNTER — Other Ambulatory Visit: Payer: Self-pay | Admitting: Family Medicine

## 2016-09-23 DIAGNOSIS — E039 Hypothyroidism, unspecified: Secondary | ICD-10-CM

## 2016-09-23 MED ORDER — LEVOTHYROXINE SODIUM 100 MCG PO TABS: 100.0000 ug | ORAL_TABLET | Freq: Every day | ORAL | 2 refills | Status: DC

## 2016-09-23 MED ORDER — LEVOTHYROXINE SODIUM 112 MCG PO TABS: ORAL_TABLET | ORAL | 3 refills | Status: DC

## 2016-09-25 ENCOUNTER — Encounter: Payer: Self-pay | Admitting: Family Medicine

## 2016-09-26 NOTE — Telephone Encounter (Signed)
° ° ° ° °  Pt would like a call back about an email he sent

## 2016-10-02 ENCOUNTER — Telehealth: Payer: Self-pay | Admitting: Family Medicine

## 2016-10-02 ENCOUNTER — Encounter: Payer: Self-pay | Admitting: Neurology

## 2016-10-02 DIAGNOSIS — G20 Parkinson's disease: Secondary | ICD-10-CM

## 2016-10-02 NOTE — Telephone Encounter (Signed)
Referral placed.

## 2016-10-02 NOTE — Telephone Encounter (Signed)
Pt wife is requesting dr tat . Pt has parkinson disease. Pt no longer wishes to see dr Rexene Alberts. Pt needs a referral

## 2016-10-03 ENCOUNTER — Other Ambulatory Visit (INDEPENDENT_AMBULATORY_CARE_PROVIDER_SITE_OTHER): Payer: Medicare Other

## 2016-10-03 ENCOUNTER — Encounter: Payer: Self-pay | Admitting: Neurology

## 2016-10-03 DIAGNOSIS — E039 Hypothyroidism, unspecified: Secondary | ICD-10-CM | POA: Diagnosis not present

## 2016-10-03 LAB — TSH: TSH: 0.29 u[IU]/mL — ABNORMAL LOW (ref 0.35–4.50)

## 2016-10-10 ENCOUNTER — Ambulatory Visit (INDEPENDENT_AMBULATORY_CARE_PROVIDER_SITE_OTHER): Payer: Medicare Other

## 2016-10-10 DIAGNOSIS — Z23 Encounter for immunization: Secondary | ICD-10-CM

## 2016-10-11 ENCOUNTER — Ambulatory Visit: Payer: Medicare Other

## 2016-10-24 ENCOUNTER — Ambulatory Visit: Payer: Medicare Other | Admitting: Neurology

## 2016-10-24 NOTE — Progress Notes (Signed)
[name] was seen today in the movement disorders clinic for neurologic consultation at the request of [place], Malka So, MD.  This patient is accompanied in the office by his spouse who supplements the history.  The consultation is for the evaluation of PD.  Pt has previously seen Dr. Rexene Alberts and I have reviewed his records in detail (went one by one through all notes, emails, phone correspondence).  Had seen Dr. Erling Cruz prior to Dr. Rexene Alberts but I don't have his notes.  I do, however, have a summary of his notes from Dr. Rexene Alberts.  The patient was diagnosed with Parkinson's disease in 2009.  He cannot remember what presenting complaint was but wife thinks it was trouble with trouble with fine motor skills on the right and he is right hand dominant.  He cannot remember if ropinirole or levodopa was his first medication (thinks ropinirole), but does remember that ropinirole in higher dosages then he currently is on caused compulsive side effects such as hypersexuality, excessive spending and daytime hypersomnolence.  Currently, he is taking ropinirole 2mg , 1/2 in the AM, 1/2 at lunch, 1/2 at dinner and 1.5 tablets at dinner.  He is taking carbidopa/levodopa 25/100, 1 tablet at 7am/10am/12pm/4pm/bedtime.   Sometimes he will forget the levodopa and he will double up on them and will feel good.     Specific Symptoms:  Tremor: rarely but does have compulsive "rubbing of thumb/finger" on the right Family hx of similar:  No. (does have a fam hx of HD and was genetically checked for that and was negative) Voice: softer than in the past (never did voice therapy) Sleep: trouble staying asleep because of back/sciatic pain/spinal stenosis  Vivid Dreams:  No.  Acting out dreams:  Rare (1 episode of falling out of bed at least a year ago but relates to 2 dogs in the bed with him) Wet Pillows: Yes.   Postural symptoms:  Yes.    Falls?  Yes.   fall in October, 2011 result in a fracture of the radius; last October  had fx of ankle - slipped getting out of shower.  Last fall was that fall in October of 2017 Bradykinesia symptoms: shuffling gait, slow movements, drooling while awake, difficulty getting out of a chair and freezing gait Loss of smell:  No. Loss of taste:  No. Urinary Incontinence:  No. Difficulty Swallowing:  Yes.   (chokes on all consistiences of food; has had an unrelated cough x 3 months) Handwriting, micrographia: Yes.   Trouble with ADL's:  No. but he is very slow  Trouble buttoning clothing: Yes.  , occasionally Depression:  Yes.  , occasionally Memory changes:  No., but wife thinks some changes Hallucinations:  Yes.   (1 time per day - once he looks a 2nd time he will know it isn't real)  visual distortions: Yes.   N/V:  No. Lightheaded:  No. (states that it resolved once azilect was d/c)  Syncope: Yes.   he did have a syncopal event in 2014 while working outside in the heat.  He did follow-up with cardiology.  It was felt vasovagal and perhaps associated with dysautonomia due to Parkinson's; had another episode in February, 2016 in which he was hospitalized for syncope.  Workup was negative.  He was not particularly orthostatic when worked up.  The patient was dizzy and diaphoretic prior to the episode.  Compression stockings were recommended.  Florinef was started in February, 2017, but he did not take that for  very long at all as he felt it caused side effects.  Pt doesn't even remember the medication Diplopia:  No. Dyskinesia:  No. Cramping of feet and legs at night:  Yes  Neuroimaging of the brain has previously been performed.  MRI done of the brain in 2009 is reported to be normal.  Films are unavailable.  PREVIOUS MEDICATIONS: Elonda Husky (went off azilect to try this and felt had SE and then went back to azilect); azilect; ropinirole; florinef; carbidopa/levodopa 25/100  ALLERGIES:   Allergies  Allergen Reactions  . Lamictal [Lamotrigine] Other (See Comments)    Blisters in  mouth  . Nsaids Other (See Comments)    Bleeding in stool   . Penicillins Rash    .Marland KitchenHas patient had a PCN reaction causing immediate rash, facial/tongue/throat swelling, SOB or lightheadedness with hypotension: No Has patient had a PCN reaction causing severe rash involving mucus membranes or skin necrosis: No Has patient had a PCN reaction that required hospitalization No Has patient had a PCN reaction occurring within the last 10 years: No If all of the above answers are "NO", then may proceed with Cephalosporin use.     CURRENT MEDICATIONS:  Outpatient Encounter Prescriptions as of 10/26/2016  Medication Sig  . aspirin 81 MG tablet Take 160 mg by mouth daily.  . Biotin 5000 MCG TABS Take 1 tablet by mouth daily.  . carbidopa-levodopa (SINEMET IR) 25-100 MG tablet Take 1 tablet by mouth 5 (five) times daily.  Marland Kitchen levothyroxine (SYNTHROID, LEVOTHROID) 100 MCG tablet Take 1 tablet (100 mcg total) by mouth daily.  . Multiple Vitamins-Minerals (MULTIVITAMIN WITH MINERALS) tablet Take 1 tablet by mouth daily.  Marland Kitchen rOPINIRole (REQUIP) 2 MG tablet TAKE 1/2 TABLET BY MOUTH AT 6:30 AM, 1/2 TABLET AT NOON, 1/2 TABLET AT 5:00 PM, AND 1 AND 1/2 TABLETS AT 8:00 PM  . carbidopa-levodopa (SINEMET CR) 50-200 MG tablet Take 1 tablet by mouth at bedtime.  . [DISCONTINUED] rasagiline (AZILECT) 1 MG TABS tablet TAKE 1/2 TABLET BY MOUTH EVERY DAY FOR 1 WEEK, THEN 1 TABLET EVERY DAY. START AFTER STOPPING XADAGO (Patient taking differently: 1/2 tab in am, 1/2 tan at noon, 1/2 tab at dinner, 1.5 tab at bedtime)   Facility-Administered Encounter Medications as of 10/26/2016  Medication  . cyanocobalamin ((VITAMIN B-12)) injection 1,000 mcg    PAST MEDICAL HISTORY:   Past Medical History:  Diagnosis Date  . Allergy   . Anemia   . Depression   . Parkinson's disease ([place])   . Parkinson's disease ([place]) 06/05/2012  . Raynaud's syndrome   . Thyroid disease     PAST SURGICAL HISTORY:   Past Surgical History:    Procedure Laterality Date  . APPENDECTOMY    . FRACTURE SURGERY     jaw  . HERNIA REPAIR    . MANDIBLE FRACTURE SURGERY    . ROTATOR CUFF REPAIR    . TONSILLECTOMY    . VASECTOMY    . WRIST FRACTURE SURGERY      SOCIAL HISTORY:   Social History   Social History  . Marital status: Married    Spouse name: [name]  . Number of children: 2  . Years of education: 12+   Occupational History  . DISABILITY Disability    due to Parkinson's   Social History Main Topics  . Smoking status: Never Smoker  . Smokeless tobacco: Never Used  . Alcohol use 0.0 - 4.2 oz/week     Comment: 1-2 beers/weeks  . Drug use: No  .  Sexual activity: No   Other Topics Concern  . Not on file   Social History Narrative   Patient is married [name])  with 2 children.   Patient is right handed.   Patient has high school education.   Patient drinks 1-2 cups daily.    FAMILY HISTORY:   Family Status  Relation Status  . Father Deceased at age 58       colon cancer  . Mother Deceased at age 22       failure to thrive  . Daughter Alive  . Son Alive  . MGM Deceased  . MGF Deceased  . PGM Deceased  . PGF Deceased  . Mat Aunt Deceased  . Mat Uncle Deceased    ROS:  A complete 10 system review of systems was obtained and was unremarkable apart from what is mentioned above.  PHYSICAL EXAMINATION:    VITALS:   Vitals:   10/26/16 0846  SpO2: 95%  Weight: 126 lb (57.2 kg)  Height: 5\' 9"  (1.753 m)    GEN:  The patient appears stated age and is in NAD. HEENT:  Normocephalic, atraumatic.  The mucous membranes are moist. The superficial temporal arteries are without ropiness or tenderness. CV:  RRR Lungs:  CTAB Neck/HEME:  There are no carotid bruits bilaterally.  Neurological examination:  Orientation:  Montreal Cognitive Assessment  10/26/2016  Visuospatial/ Executive (0/5) 2  Naming (0/3) 3  Attention: Read list of digits (0/2) 1  Attention: Read list of letters (0/1) 1  Attention:  Serial 7 subtraction starting at 100 (0/3) 2  Language: Repeat phrase (0/2) 2  Language : Fluency (0/1) 0  Abstraction (0/2) 2  Delayed Recall (0/5) 4  Orientation (0/6) 6  Total 23  Adjusted Score (based on education) 24   Cranial nerves: There is good facial symmetry except L pseudoptosis from lid lag. There is facial hypomimia.  Pupils are equal round and reactive to light bilaterally. Fundoscopic exam is attempted but the disc margins are not well visualized bilaterally. Extraocular muscles are intact. The visual fields are full to confrontational testing. The speech is fluent and mildly dysarthric.   Soft palate rises symmetrically and there is no tongue deviation. Hearing is intact to conversational tone. Sensation: Sensation is intact to light and pinprick throughout (facial, trunk, extremities). Vibration is absent at the bilateral big toe and intact at the ankle. There is no extinction with double simultaneous stimulation. There is no sensory dermatomal level identified. Motor: Strength is 5/5 in the bilateral upper and lower extremities.   Shoulder shrug is equal and symmetric.  There is no pronator drift. Deep tendon reflexes: Deep tendon reflexes are 2-/4 at the bilateral biceps, triceps, brachioradialis, patella and achilles. Plantar responses are downgoing bilaterally.  Movement examination: Tone: There is mild to mod increased tone in the bilateral upper and lower extremities.  There is a component of gegenhalten Abnormal movements: mild dyskinesia of the head if distracted.  No tremor noted even with distraction procedures Coordination:  There is decremation with RAM's,  including alternating supination and pronation of the forearm, hand opening and closing, finger taps.  Heel taps are fairly good bilaterally.  Toe taps are decreased on the L Gait and Station: The patient has no difficulty arising out of a deep-seated chair without the use of the hands. The patient's gait is  antalgic on the L due to back pain.  There is no arm swing on the L.  The patient has a negative pull  test.      ASSESSMENT/PLAN:  1.  Parkinsonism.  He was dx in 2009.  His PD has now been complicated by dysautonomia with syncope, falls  -He has been on ropinirole since diagnosis.  He remains on ropinirole 2 mg, half a tablet at 6:30 AM, half a tablet at noon, half a tablet at 5 PM and one and a half tablets at 8 PM.  This is a total of 6 mg of Requip per day.  He apparently was on higher dosages in the past but this caused compulsive spending, hypersexuality and excessive daytime hypersomnolence.  Told him that we may consider lowering this in the future due to hallucination/visual distortion but changing other things today  -change carbidopa/levodopa 25/100 from 1 po 5 times per day to 1 po qid and add carbidopa/levodopa 50/200 to see if it helps bedtime cramping  -We discussed the diagnosis as well as pathophysiology of the disease.  We discussed treatment options as well as prognostic indicators.  Patient education was provided.  -We discussed that it used to be thought that levodopa would increase risk of melanoma but now it is believed that Parkinsons itself likely increases risk of melanoma. he is to get regular skin checks.  -Greater than 50% of the 60 minute visit was spent in counseling answering questions and talking about what to expect now as well as in the future.  We talked about medication options as well as potential future surgical options.  We talked about safety in the home.  -I will refer the patient to the Parkinson's program [facility], for PT/OT and ST.  We talked about the importance of safe, cardiovascular exercise in Parkinson's disease.  -We discussed community resources in the area including patient support groups and community exercise programs for PD and pt education was provided to the patient.  -they asked about driving.  They were given  information to the occupational therapy driving program.  They are to make an appointment.  2.  Fam hx of Huntington Disease  -pt was genetically tested per records (don't have the actual testing) and was negative  3.  Dysphagia  -had MBE in 02/2016 with small penetration of think liquid.  She recommended mechanical soft diet with thin liquids.  -recommended outpatient SLP.  I will send a referral, as above.  4.  Follow up is anticipated in the next few months, sooner should new neurologic issues arise.  Much greater than 50% of this visit was spent in counseling and coordinating care.  Total face to face time:  60 min.  time spent with the patient did not include the 35 min non face to face time spent in record review.   Cc:  [place], Betty G, MD

## 2016-10-26 ENCOUNTER — Ambulatory Visit (INDEPENDENT_AMBULATORY_CARE_PROVIDER_SITE_OTHER): Payer: Medicare Other | Admitting: Neurology

## 2016-10-26 ENCOUNTER — Encounter: Payer: Self-pay | Admitting: Neurology

## 2016-10-26 VITALS — Ht 69.0 in | Wt 126.0 lb

## 2016-10-26 DIAGNOSIS — R1319 Other dysphagia: Secondary | ICD-10-CM

## 2016-10-26 DIAGNOSIS — R441 Visual hallucinations: Secondary | ICD-10-CM

## 2016-10-26 DIAGNOSIS — G249 Dystonia, unspecified: Secondary | ICD-10-CM

## 2016-10-26 DIAGNOSIS — G20 Parkinson's disease: Secondary | ICD-10-CM

## 2016-10-26 MED ORDER — CARBIDOPA-LEVODOPA ER 50-200 MG PO TBCR: 1.0000 | EXTENDED_RELEASE_TABLET | Freq: Every day | ORAL | 1 refills | Status: DC

## 2016-10-26 NOTE — Patient Instructions (Signed)
1. Take Carbidopa Levodopa 25/100 IR - 1 tablet four times daily. Change the fifth dose (bedtime dose) to Carbidopa Levodopa 50/200 CR - 1 at bedtime.   2. If you are interested in the driving assessment, you can contact [facility] [employer] [facility] in [place]. [phone].  3. You have been referred to [facility]. They will call you directly to schedule an appointment for physical, occupational, and speech therapy.  Please call [phone] if you do not hear from them.

## 2016-10-30 ENCOUNTER — Other Ambulatory Visit: Payer: Self-pay | Admitting: Family Medicine

## 2016-10-30 ENCOUNTER — Ambulatory Visit (INDEPENDENT_AMBULATORY_CARE_PROVIDER_SITE_OTHER)
Admission: RE | Admit: 2016-10-30 | Discharge: 2016-10-30 | Disposition: A | Discharge status: Home / Self Care | Payer: Medicare Other | Source: Ambulatory Visit | Attending: Family Medicine | Admitting: Family Medicine

## 2016-10-30 ENCOUNTER — Ambulatory Visit (INDEPENDENT_AMBULATORY_CARE_PROVIDER_SITE_OTHER): Payer: Medicare Other | Admitting: Family Medicine

## 2016-10-30 ENCOUNTER — Encounter: Payer: Self-pay | Admitting: Family Medicine

## 2016-10-30 VITALS — BP 124/78 | HR 75 | Resp 12 | Ht 69.0 in | Wt 128.5 lb

## 2016-10-30 DIAGNOSIS — R05 Cough: Secondary | ICD-10-CM

## 2016-10-30 DIAGNOSIS — R053 Chronic cough: Secondary | ICD-10-CM

## 2016-10-30 DIAGNOSIS — R0781 Pleurodynia: Secondary | ICD-10-CM

## 2016-10-30 DIAGNOSIS — R0789 Other chest pain: Secondary | ICD-10-CM | POA: Diagnosis not present

## 2016-10-30 DIAGNOSIS — K219 Gastro-esophageal reflux disease without esophagitis: Secondary | ICD-10-CM

## 2016-10-30 MED ORDER — CELECOXIB 100 MG PO CAPS: 100.0000 mg | ORAL_CAPSULE | Freq: Two times a day (BID) | ORAL | 0 refills | Status: AC

## 2016-10-30 MED ORDER — OMEPRAZOLE 20 MG PO CPDR: 20.0000 mg | DELAYED_RELEASE_CAPSULE | Freq: Two times a day (BID) | ORAL | 1 refills | Status: DC

## 2016-10-30 NOTE — Progress Notes (Signed)
ACUTE VISIT   HPI:  Chief Complaint  Patient presents with  . right side pain    [name] is a 71 y.o. male, who is here today with his wife complaining of 3 days of sudden onset right rib cage pain.  He has Hx of Parkinson disease and lower back pain with radiculopathy.  This right rib cage pain is new, sharp, 5/10, and not radiated. His wife thinks he may have pull a muscle due to cough. He does not recall pain starting during cough spell or while doing any specific activity.   Muscle Pain  This is a new problem. The current episode started in the past 7 days. The problem occurs intermittently. The problem is unchanged. The context of the pain is unknown. The pain is present in the right ribs. The pain is medium. The symptoms are aggravated by any movement. Associated symptoms include fatigue (no more than usual). Pertinent negatives include no abdominal pain, chest pain, dysuria, fever, headaches, nausea, rash, sensory change, shortness of breath, urinary symptoms, vomiting, weakness or wheezing. Past treatments include nothing. There is no swelling present.  Pain is exacerbated by coughing, twisting, getting up,and lying down.  Denies rash, local edema or erythema, deformity ,or numbness/tinging on affected area.   Lab Results  Component Value Date   ALT 18 01/17/2016   AST 35 01/17/2016   ALKPHOS 90 01/17/2016   BILITOT 0.3 01/17/2016   He has not taking any OTC medications, according to patient he cannot take Ibuprofen because history of GI bleed. He has had intermittent heartburn.   HX of dysphasia, swallowing study 02/24/2016 showed moderate oral phase dysphagia;moderate pharyngeal phase dysphagia. Recommendations were given.  Denies abdominal pain, nausea, vomiting, changes in bowel habits, blood in stool or melena.  Cough: According to wife, he has had cough, sometimes productive with clear sputum, since last Winter. He has not had  fever,chills, abnormal wt loss. No sick contact or recent travel.  Cough seems to be worse with eating and with exercise., also when it is hot. Denies associated dyspnea,wheezing, chest pain, orthopnea,PND,or edema. He has not tried OTC medication.   Review of Systems  Constitutional: Positive for fatigue (no more than usual). Negative for activity change, appetite change, chills, fever and unexpected weight change.  HENT: Positive for trouble swallowing. Negative for mouth sores, nosebleeds, sore throat and voice change.   Respiratory: Positive for cough. Negative for shortness of breath and wheezing.   Cardiovascular: Negative for chest pain, palpitations and leg swelling.  Gastrointestinal: Negative for abdominal pain, nausea and vomiting.       No changes in bowel habits.  Genitourinary: Negative for decreased urine volume, dysuria and hematuria.  Musculoskeletal: Negative for back pain, myalgias and neck pain.  Skin: Negative for pallor and rash.  Allergic/Immunologic: Positive for environmental allergies.  Neurological: Negative for dizziness, sensory change, syncope, weakness, numbness and headaches.  Hematological: Negative for adenopathy. Does not bruise/bleed easily.  Psychiatric/Behavioral: Negative for confusion and sleep disturbance. The patient is nervous/anxious.       Current Outpatient Prescriptions on File Prior to Visit  Medication Sig Dispense Refill  . aspirin 81 MG tablet Take 160 mg by mouth daily.    . Biotin 5000 MCG TABS Take 1 tablet by mouth daily.    . carbidopa-levodopa (SINEMET CR) 50-200 MG tablet Take 1 tablet by mouth at bedtime. 90 tablet 1  . levothyroxine (SYNTHROID, LEVOTHROID) 100 MCG tablet Take 1 tablet (100 mcg  total) by mouth daily. 90 tablet 2  . Multiple Vitamins-Minerals (MULTIVITAMIN WITH MINERALS) tablet Take 1 tablet by mouth daily.    Marland Kitchen rOPINIRole (REQUIP) 2 MG tablet TAKE 1/2 TABLET BY MOUTH AT 6:30 AM, 1/2 TABLET AT NOON, 1/2 TABLET  AT 5:00 PM, AND 1 AND 1/2 TABLETS AT 8:00 PM 270 tablet 3   Current Facility-Administered Medications on File Prior to Visit  Medication Dose Route Frequency Provider Last Rate Last Dose  . cyanocobalamin ((VITAMIN B-12)) injection 1,000 mcg  1,000 mcg Intramuscular Q30 days Orma Flaming, MD   1,000 mcg at 07/28/14 1344     Past Medical History:  Diagnosis Date  . Allergy   . Anemia   . Depression   . Parkinson's disease ([place])   . Parkinson's disease ([place]) 06/05/2012  . Raynaud's syndrome   . Thyroid disease    Allergies  Allergen Reactions  . Lamictal [Lamotrigine] Other (See Comments)    Blisters in mouth  . Nsaids Other (See Comments)    Bleeding in stool   . Penicillins Rash    .Marland KitchenHas patient had a PCN reaction causing immediate rash, facial/tongue/throat swelling, SOB or lightheadedness with hypotension: No Has patient had a PCN reaction causing severe rash involving mucus membranes or skin necrosis: No Has patient had a PCN reaction that required hospitalization No Has patient had a PCN reaction occurring within the last 10 years: No If all of the above answers are "NO", then may proceed with Cephalosporin use.     Social History   Social History  . Marital status: Married    Spouse name: [name]  . Number of children: 2  . Years of education: 12+   Occupational History  . DISABILITY Disability    due to Parkinson's   Social History Main Topics  . Smoking status: Never Smoker  . Smokeless tobacco: Never Used  . Alcohol use 0.0 - 4.2 oz/week     Comment: 1-2 beers/weeks  . Drug use: No  . Sexual activity: No   Other Topics Concern  . None   Social History Narrative   Patient is married [name])  with 2 children.   Patient is right handed.   Patient has high school education.   Patient drinks 1-2 cups daily.    Vitals:   10/30/16 1435  BP: 124/78  Pulse: 75  Resp: 12  SpO2: 92%   Body mass index is 18.98 kg/m.   Physical Exam  Nursing note  and vitals reviewed. Constitutional: He is oriented to person, place, and time. He appears well-developed and well-nourished. No distress.  HENT:  Head: Normocephalic and atraumatic.  Mouth/Throat: Oropharynx is clear and moist and mucous membranes are normal.  Eyes: Pupils are equal, round, and reactive to light. Conjunctivae are normal.  Cardiovascular: Normal rate and regular rhythm.   No murmur heard. Pulses:      Dorsalis pedis pulses are 2+ on the right side, and 2+ on the left side.  Respiratory: Effort normal and breath sounds normal. No respiratory distress.  Shallow breathing due to rib cage pain.  GI: Soft. He exhibits no mass. There is no hepatomegaly. There is no tenderness.  Musculoskeletal: He exhibits tenderness. He exhibits no edema or deformity.       Thoracic back: He exhibits no tenderness and no bony tenderness.       Lumbar back: He exhibits no tenderness and no bony tenderness.  Rib cage tenderness with palpation of lateral aspect. Also pain elicited with  deep breathing and movement,when getting up from exam table.  Lymphadenopathy:    He has no cervical adenopathy.       Right: No supraclavicular adenopathy present.       Left: No supraclavicular adenopathy present.  Neurological: He is alert and oriented to person, place, and time. He has normal strength.  Stable gait with no assistance.  Skin: Skin is warm. No rash noted. No erythema.  Psychiatric: His mood appears anxious. Cognition and memory are normal.  Well groomed, good eye contact.    ASSESSMENT AND PLAN:   Mr. Coree was seen today for right side pain.  Diagnoses and all orders for this visit:  Costal margin pain  We discussed possible causes, it seems musculoskeletal. No Hx of trauma but coughing spells, so rib X ray ordered to evaluate for fractures or lytic lesions. Avoid shallow breathing. Celebrex, explained is a NSAID's that is more gentle with GI system.  He can also use OTC Lidocaine  patches. Monitor for rash or other associated symptoms.  -     DG Ribs Unilateral Right; Future -     celecoxib (CELEBREX) 100 MG capsule; Take 1 capsule (100 mg total) by mouth 2 (two) times daily.  Cough, persistent  ? Allergies,GERD among some to consider. Auscultation today negative, CXR ordered today. Continue with dysphagia recommendations. Instructed about warning signs.  -     DG Chest 2 View; Future  Gastroesophageal reflux disease, esophagitis presence not specified  GERD precautions discussed. He agrees with stating Omeprazole 20 mg bid.   -      Omeprazole (PRILOSEC) 20 MG capsule; Take 1 capsule (20 mg total) by mouth 2 (two) times daily before a meal.     Return in about 6 weeks (around 12/11/2016) for cough and rib cage pain.     -Mr.Emeka Lindner was advised to seek immediate medical attention if sudden worsening symptoms.     Unknown Flannigan G. [place], MD  [facility]. [place] office.

## 2016-10-30 NOTE — Patient Instructions (Addendum)
A few things to remember from today's visit:   Costal margin pain - Plan: DG Ribs Unilateral Right, celecoxib (CELEBREX) 100 MG capsule  Cough, persistent - Plan: DG Chest 2 View   Avoid foods that make your symptoms worse, for example coffee, chocolate,pepermeint,alcohol, and greasy food. Raising the head of your bed about 6 inches may help with nocturnal symptoms.  Avoid tobacco use. Weight loss (if you are overweight). Avoid lying down for 3 hours after eating.  Instead 3 large meals daily try small and more frequent meals during the day.  Every medication have side effects and medications for GERD are not the exception.At this time I think benefit is greater than risk.    You should be evaluated immediately if bloody vomiting, bloody stools, black stools (like tar), difficulty swallowing, food gets stuck on the way down or choking when eating. Abnormal weight loss or severe abdominal pain.  If symptoms are not resolved sometimes endoscopy is necessary.  Please be sure medication list is accurate. If a new problem present, please set up appointment sooner than planned today.

## 2016-10-31 ENCOUNTER — Telehealth: Payer: Self-pay | Admitting: Family Medicine

## 2016-10-31 ENCOUNTER — Encounter: Payer: Self-pay | Admitting: Family Medicine

## 2016-10-31 NOTE — Telephone Encounter (Addendum)
Pt would like wrist xray etc  Result. Pt said he does not have mychart

## 2016-10-31 NOTE — Telephone Encounter (Signed)
Informed patient of results and patient verbalized understanding.  

## 2016-11-05 ENCOUNTER — Encounter: Payer: Self-pay | Admitting: Neurology

## 2016-11-05 ENCOUNTER — Encounter: Payer: Self-pay | Admitting: Family Medicine

## 2016-11-07 ENCOUNTER — Encounter: Payer: Self-pay | Admitting: Neurology

## 2016-11-09 ENCOUNTER — Encounter: Payer: Self-pay | Admitting: Family Medicine

## 2016-11-16 DIAGNOSIS — M419 Scoliosis, unspecified: Secondary | ICD-10-CM | POA: Diagnosis not present

## 2016-11-16 DIAGNOSIS — M47816 Spondylosis without myelopathy or radiculopathy, lumbar region: Secondary | ICD-10-CM | POA: Diagnosis not present

## 2016-11-16 DIAGNOSIS — M79604 Pain in right leg: Secondary | ICD-10-CM | POA: Diagnosis not present

## 2016-11-16 DIAGNOSIS — Z681 Body mass index (BMI) 19 or less, adult: Secondary | ICD-10-CM | POA: Diagnosis not present

## 2016-11-20 ENCOUNTER — Telehealth: Payer: Self-pay | Admitting: Neurology

## 2016-11-20 ENCOUNTER — Ambulatory Visit: Payer: Medicare Other | Admitting: Occupational Therapy

## 2016-11-20 ENCOUNTER — Ambulatory Visit: Payer: Medicare Other | Attending: Neurology | Admitting: Physical Therapy

## 2016-11-20 DIAGNOSIS — R293 Abnormal posture: Secondary | ICD-10-CM

## 2016-11-20 DIAGNOSIS — R1312 Dysphagia, oropharyngeal phase: Secondary | ICD-10-CM | POA: Diagnosis not present

## 2016-11-20 DIAGNOSIS — R278 Other lack of coordination: Secondary | ICD-10-CM | POA: Insufficient documentation

## 2016-11-20 DIAGNOSIS — R2681 Unsteadiness on feet: Secondary | ICD-10-CM

## 2016-11-20 DIAGNOSIS — R29818 Other symptoms and signs involving the nervous system: Secondary | ICD-10-CM

## 2016-11-20 DIAGNOSIS — R2689 Other abnormalities of gait and mobility: Secondary | ICD-10-CM | POA: Diagnosis not present

## 2016-11-20 DIAGNOSIS — R41840 Attention and concentration deficit: Secondary | ICD-10-CM | POA: Insufficient documentation

## 2016-11-20 DIAGNOSIS — R29898 Other symptoms and signs involving the musculoskeletal system: Secondary | ICD-10-CM

## 2016-11-20 DIAGNOSIS — R471 Dysarthria and anarthria: Secondary | ICD-10-CM | POA: Diagnosis not present

## 2016-11-20 DIAGNOSIS — M6281 Muscle weakness (generalized): Secondary | ICD-10-CM | POA: Diagnosis not present

## 2016-11-20 DIAGNOSIS — R41842 Visuospatial deficit: Secondary | ICD-10-CM

## 2016-11-20 NOTE — Telephone Encounter (Signed)
Pt saw Dr. Ellene Route 11/16/16.  Patient had artery been seen in [place] or thoroughly and treated by Dr. Leane Para did a number of injections.  Dr. Ellene Route did not recommend surgery for the patient's scoliosis given that the patient had Parkinson's disease and anesthesia had been difficult for him.  Scoliosis operation would be a protracted operation.

## 2016-11-20 NOTE — Therapy (Signed)
[facility] [address] [place], [place], [address] Phone: [phone]   Fax:  [phone]  Occupational Therapy Evaluation  Patient Details  Name: [name] MRN: [id number] Date of Birth: 07-16-1945 Referring Provider: Dr. Carles Collet  Encounter Date: 11/20/2016      OT End of Session - 11/20/16 1446    Visit Number 1   Number of Visits 17   Date for OT Re-Evaluation 01/19/17   Authorization Type MCR Primary - G code needed, [employer] secondary   Authorization - Visit Number 1   Authorization - Number of Visits 10   OT Start Time 9326   OT Stop Time 1315   OT Time Calculation (min) 55 min      Past Medical History:  Diagnosis Date  . Allergy   . Anemia   . Depression   . Parkinson's disease ([place])   . Parkinson's disease ([place]) 06/05/2012  . Raynaud's syndrome   . Thyroid disease     Past Surgical History:  Procedure Laterality Date  . APPENDECTOMY    . FRACTURE SURGERY     jaw  . HERNIA REPAIR    . MANDIBLE FRACTURE SURGERY    . ROTATOR CUFF REPAIR    . TONSILLECTOMY    . VASECTOMY    . WRIST FRACTURE SURGERY      There were no vitals filed for this visit.      Subjective Assessment - 11/20/16 1223    Subjective  Everything got worse since my foot surgery last year   Patient is accompained by: Family member  wife [name])    Pertinent History PD since 2009, spinal stenosis, scoliosis, HOH, Raynauds, h/o falls with radius fx in 2011?, Rt ankle fx 2017, Rt rotator cuff surgery 2003   Limitations fall risk, limited driving   Patient Stated Goals put on my watch   Currently in Pain? Yes   Pain Score 3    Pain Location Back   Pain Orientation Lower   Pain Descriptors / Indicators Radiating;Aching   Pain Type Chronic pain  since 2017   Pain Radiating Towards down to Rt foot   Pain Onset More than a month ago   Pain Frequency Intermittent   Aggravating Factors  standing too long and walking    Pain Relieving Factors OTC meds           [facility] OT Assessment - 11/20/16 0001      Assessment   Diagnosis Parkinsons disease   Referring Provider Dr. Carles Collet   Onset Date --  2009   Assessment H/o falls, Rt side worse than Lt with decr. arm swings on Rt, decreased processing, hallucinations, restless leg syndrome, denies tremors. Pt also with dysarthria and difficulty swallowing. Pt taking Senimet 5x/day (1 of which is slow release at night)    Prior Therapy none for PD     Precautions   Precautions Fall     Balance Screen   Has the patient fallen in the past 6 months Yes   How many times? 2   Has the patient had a decrease in activity level because of a fear of falling?  No   Is the patient reluctant to leave their home because of a fear of falling?  No     Home  Environment   Writer;Door  built in bench, grab bars   Additional Comments Pt lives with wife and dogs in 1 level home, 4 steps to enter  Lives With Spouse     Prior Function   Level of Independence Independent with basic ADLs  assist to cut food, occasionally button shirt   Vocation Retired     ADL   Eating/Feeding Needs assist with cutting food  difficulty getting food on utensil   Grooming Modified independent  increased time   Upper Body Bathing Modified independent   Lower Body Bathing Increased time   Upper Body Dressing Increased time;Needs assist for fasteners  dependent for rolling up sleeves   Lower Body Dressing Increased time  difficulty and occasional assist w/ buttons, tying shoes   Toilet Transfer Modified independent   Addison Transfer Modified independent   ADL comments Pt with incr. time and assist often needed for buttons, tying shoes, cutting food. Getting in/out of car difficulty     IADL   Shopping --  wife always performed   Light Housekeeping --  pt vacuums   Meal Prep --  Pt grills with occasional assist  for safety, opening door   Community Mobility Relies on family or friends for transportation  drives limited distances   Medication Management Has difficulty remembering to take medication;Takes responsibility if medication is prepared in advance in seperate dosage   Financial Management --  wife always performed     Mobility   Mobility Status Freezing   Mobility Status Comments Freezing gait      Written Expression   Dominant Hand Right   Handwriting Mild micrographia;100% legible  slower     Vision - History   Additional Comments c/o bluriness at times, visual hallucinations on avg 2x/wk     Observation/Other Assessments   Observations Pt with decreased processing speed, visual and cognitive changes. Pt also reports using LUE more than RUE now   Standing Functional Reach Test Rt = 12.5", Lt = 14"   Other Surveys  Select   Physical Performance Test   Yes   Simulated Eating Time (seconds) 15.78 sec.   Simulated Eating Comments pt reports difficulty at times getting food onto utensil   Donning Doffing Jacket Time (seconds) 25.29 sec   Donning Doffing Jacket Comments 3 button/unbutton test: 59.01 sec. (while standing with his shirt on; unable to do on tabletop)     Sensation   Additional Comments decreased Rt foot only     Coordination   9 Hole Peg Test Right;Left   Right 9 Hole Peg Test 36.90 sec.    Left 9 Hole Peg Test 24.87 sec   Box and Blocks Rt = 30, Lt = 48    Tremors denies     Edema   Edema none     Tone   Assessment Location Right Upper Extremity;Left Upper Extremity     AROM   Overall AROM Comments BUE AROM WFL's with decreased elbow extension on RT      RUE Tone   RUE Tone Mild  rigidity at wrist, noted decr. arm swing RUE with gait     LUE Tone   LUE Tone Mild  rigidity at wrist, elbow                         OT Education - 11/20/16 1443    Education provided Yes   Education Details OT Results/POC, avoiding foods 30 min. prior  to meds, symptoms of PD   Person(s) Educated Patient;Spouse   Methods Explanation   Comprehension Verbalized understanding  OT Short Term Goals - 11/20/16 1455      OT SHORT TERM GOAL #1   Title Pt/family will be independent with PD specific HEP including PWR! moves and coordination HEP    Time 4   Period Weeks   Status New   Target Date 12/20/16     OT SHORT TERM GOAL #2   Title Pt will write at least 3 sentences with no significant decr in size and 95% legibility or greater    Time 4   Period Weeks   Status New     OT SHORT TERM GOAL #3   Title Pt will perform PPT #2 test in 12 sec. or under   Baseline eval = 15.78 sec   Time 4   Period Weeks   Status New     OT SHORT TERM GOAL #4   Title Pt will perform PPT #4 test in 22 sec. or under   Baseline eval = 25.29 sec   Time 4   Period Weeks   Status New     OT SHORT TERM GOAL #5   Title Pt will increase coordination Rt hand as evidenced by performing 9 hole peg test in 32 sec. or less   Baseline eval = 36.90 sec   Time 4   Period Weeks   Status New     Additional Short Term Goals   Additional Short Term Goals Yes     OT SHORT TERM GOAL #6   Title Pt will increase RUE function as evidenced by performing 34 blocks or greater on Box & Blocks test   Baseline eval = 30 (Lt = 48)    Time 4   Period Weeks   Status New           OT Long Term Goals - 11/20/16 1501      OT LONG TERM GOAL #1   Title Pt will verbalize understanding of ways to prevent future PD related complications and appropriate community and national resources prn   Time 8   Period Weeks   Status New   Target Date 01/19/17     OT LONG TERM GOAL #2   Title Pt will verbalize understanding of adaptive strategies to incr ease with ADLS/IADLS   Time 8   Period Weeks   Status New     OT LONG TERM GOAL #3   Title Pt will perform PPT #4 in 19 sec. or under   Baseline eval = 25.29 sec   Time 8   Period Weeks   Status New     OT  LONG TERM GOAL #4   Title Pt will improve functional coordination as evidenced by reducing time on 3 button test to 40 sec. or less   Baseline 59.01 sec. (standing with shirt on)    Time 8   Period Weeks   Status New     OT LONG TERM GOAL #5   Title Pt will verbalize understanding of ways to keep thinking skills sharp   Time 8   Period Weeks   Status New     Long Term Additional Goals   Additional Long Term Goals Yes     OT LONG TERM GOAL #6   Title Pt to report using RUE as dominant UE for ADLS/IADLS 08% of the time   Time 8   Period Weeks   Status New               Plan - 11/20/16 1448  Clinical Impression Statement Pt is a 71 y.o. male who presents to outpatient O.T. with Parkinsons disease diagnosed 2009. Pt presents with bradykinesia, abnormal posture, falls, mild rigidity, decreased coordination, decreased cognition and vision from PD. Pt would benefit from OT to develop PD specific HEP, increase ease with ADLS/IADLS and prevent future complications from PD   Occupational Profile and client history currently impacting functional performance PMH: PD, Scoliosis, spinal stenosis, Raynauds, HOH, syncompe, falls with radius fx, anke fx 2017, rotator cuff repair 2003. Deficits with PMH limiting pt's ability to perform any self care needs, IADLS, social participation   Occupational performance deficits (Please refer to evaluation for details): ADL's;IADL's;Leisure;Social Participation;Rest and Sleep   Rehab Potential Good   OT Frequency 2x / week   OT Duration 8 weeks  PLUS EVAL   OT Treatment/Interventions Self-care/ADL training;DME and/or AE instruction;Patient/family education;Splinting;Therapeutic exercises;Therapeutic activities;Neuromuscular education;Functional Mobility Training;Passive range of motion;Cognitive remediation/compensation;Visual/perceptual remediation/compensation;Manual Therapy   Plan modifed PWR! Seated, nutrition and constipation management (Following  session: PWR! Hands, coordination HEP)    Clinical Decision Making Several treatment options, min-mod task modification necessary   Consulted and Agree with Plan of Care Patient;Family member/caregiver   Family Member Consulted Wife      Patient will benefit from skilled therapeutic intervention in order to improve the following deficits and impairments:  Decreased coordination, Impaired flexibility, Improper body mechanics, Improper spinal/pelvic alignment, Decreased safety awareness, Decreased endurance, Decreased knowledge of precautions, Impaired tone, Impaired UE functional use, Pain, Decreased knowledge of use of DME, Decreased cognition, Decreased mobility, Decreased strength, Impaired perceived functional ability, Impaired vision/preception  Visit Diagnosis: Other symptoms and signs involving the nervous system - Plan: Ot plan of care cert/re-cert  Other symptoms and signs involving the musculoskeletal system - Plan: Ot plan of care cert/re-cert  Other lack of coordination - Plan: Ot plan of care cert/re-cert  Abnormal posture - Plan: Ot plan of care cert/re-cert  Unsteadiness on feet - Plan: Ot plan of care cert/re-cert  Attention and concentration deficit - Plan: Ot plan of care cert/re-cert  Visuospatial deficit - Plan: Ot plan of care cert/re-cert      G-Codes - 24/26/83 1507    Functional Assessment Tool Used (Outpatient only) RUE: Based on 9 hole peg test, Box & Blocks, 3 button test   Functional Limitation Carrying, moving and handling objects   Carrying, Moving and Handling Objects Current Status (M1962) At least 40 percent but less than 60 percent impaired, limited or restricted   Carrying, Moving and Handling Objects Goal Status (I2979) At least 1 percent but less than 20 percent impaired, limited or restricted      Problem List Patient Active Problem List   Diagnosis Date Noted  . Lumbar back pain with radiculopathy affecting right lower extremity 09/19/2016   . Elevated transaminase level 12/20/2015  . Chronic fatigue 12/02/2015  . Raynaud's phenomenon 04/16/2014  . Insomnia 03/31/2014  . Syncope 11/11/2012  . Autonomic postural hypotension 11/11/2012  . Parkinson's disease ([place]) 06/05/2012  . Restless leg syndrome 11/15/2011  . Primary hypothyroidism 09/11/2011  . Parkinsonism ([place]) 04/02/2011  . Depression, major, recurrent, in partial remission ([place]) 04/02/2011    Carey Bullocks, OTR/L 11/20/2016, 3:13 PM  [facility] [address] [place], [place], [address] Phone: [phone]   Fax:  [phone]  Name: [name] MRN: [id number] Date of Birth: 09/30/1945

## 2016-11-21 NOTE — Therapy (Signed)
[facility] [address] [place], [place], [address] Phone: [phone]   Fax:  [phone]  Physical Therapy Evaluation  Patient Details  Name: [name] MRN: [id number] Date of Birth: March 11, 1945 Referring Provider: Alonza Bogus, DO  Encounter Date: 11/20/2016      PT End of Session - 11/21/16 0532    Visit Number 1   Number of Visits 17   Date for PT Re-Evaluation 01/19/17   Authorization Type Medicare, UHC   PT Start Time 1317   PT Stop Time 1402   PT Time Calculation (min) 45 min   Activity Tolerance Patient tolerated treatment well   Behavior During Therapy [facility] for tasks assessed/performed      Past Medical History:  Diagnosis Date  . Allergy   . Anemia   . Depression   . Parkinson's disease ([place])   . Parkinson's disease ([place]) 06/05/2012  . Raynaud's syndrome   . Thyroid disease     Past Surgical History:  Procedure Laterality Date  . APPENDECTOMY    . FRACTURE SURGERY     jaw  . HERNIA REPAIR    . MANDIBLE FRACTURE SURGERY    . ROTATOR CUFF REPAIR    . TONSILLECTOMY    . VASECTOMY    . WRIST FRACTURE SURGERY      There were no vitals filed for this visit.       Subjective Assessment - 11/20/16 1321    Subjective Pt reports he has had a decline in mobility since R foot fracture in October 2017.  He also notes problems with walking long distances due to scoliosis (because of fatigue and pain, especially as the day goes on).  Reports 2 falls in the past 6 months-"just blacked out".  Does not use assistive device, but has cane at home.   Patient is accompained by: Family member  wife, [name]   Pertinent History anemia, depression, Parkinson's disease dx 2009, R foot fracture ([facility] for foot fracture in March 2018); R RTC repair, scoliosis (not a surgical candidate)   Patient Stated Goals Pt's goals for therapy include exercises for back, help with more mobility in R  hand; be able to stand up straighter and walk better (per wife)   Currently in Pain? Yes   Pain Score 3    Pain Location Back   Pain Orientation Lower   Pain Descriptors / Indicators Aching;Radiating   Pain Type Chronic pain   Pain Radiating Towards down to R foot   Pain Onset More than a month ago   Pain Frequency Intermittent   Aggravating Factors  lying down in uncomfortable positions, standing and walking tooo pain   Pain Relieving Factors OTC meds            [facility] PT Assessment - 11/20/16 1329      Assessment   Medical Diagnosis Parkinson's disease   Referring Provider Alonza Bogus, DO   Onset Date/Surgical Date 10/26/16     Precautions   Precautions Fall     Balance Screen   Has the patient fallen in the past 6 months Yes   How many times? 2   Has the patient had a decrease in activity level because of a fear of falling?  No  "more cautious that I used to be"   Is the patient reluctant to leave their home because of a fear of falling?  No     Home Ecologist residence  Living Arrangements Spouse/significant other   Available Help at Discharge Family   Type of [place] to enter   Entrance Stairs-Number of Steps 4   Entrance Stairs-Rails Right  (front); both sides on back set of steps   Home Layout One level   [place] - single point;Shower seat;Grab bars - tub/shower     Prior Function   Level of Independence Independent with basic ADLs   Vocation Retired   Leisure Enjoys yardwork, walking in the neighborhood, building birdhouses     Observation/Other Assessments   Focus on Therapeutic Outcomes (FOTO)  NA     Posture/Postural Control   Posture/Postural Control Postural limitations   Postural Limitations Rounded Shoulders;Forward head  R shoulder lower than L   Posture Comments Scoliotic curve, concave to L side, with shortened trunk on R, upper trunk and shoulder on R are posteriorly  rotated.  With gait, pt holds RUE in elbow flexion at side, with fingers adducted     Tone   Assessment Location Right Lower Extremity;Left Lower Extremity     ROM / Strength   AROM / PROM / Strength AROM;Strength     AROM   Overall AROM  Deficits   Overall AROM Comments Pt lacks full knee extension with seated LAQ on RLE, and has R ankle dorsiflexion to neutral     Strength   Overall Strength Deficits   Strength Assessment Site Hip;Knee;Ankle   Right/Left Hip Right;Left   Right Hip Flexion 5/5   Left Hip Flexion 5/5   Right/Left Knee Right;Left   Right Knee Flexion 4/5   Right Knee Extension 4/5   Left Knee Flexion 4/5   Left Knee Extension 4/5   Right/Left Ankle Right;Left   Right Ankle Dorsiflexion 3-/5   Left Ankle Dorsiflexion 4/5     Transfers   Transfers Sit to Stand;Stand to Sit   Sit to Stand 5: Supervision;Without upper extremity assist;From chair/3-in-1   Five time sit to stand comments  17.06  Slowed, increased effort   Stand to Sit 5: Supervision;Without upper extremity assist;To chair/3-in-1   Comments Prefers use of hands for sit<>stand, reports difficulty with lower surface transfers     Ambulation/Gait   Ambulation/Gait Yes   Ambulation Distance (Feet) 150 Feet   Assistive device None   Gait Pattern Step-through pattern;Decreased arm swing - right;Decreased step length - right;Decreased dorsiflexion - right;Decreased trunk rotation;Trunk rotated posteriorly on right;Narrow base of support;Poor foot clearance - right   Ambulation Surface Level   Gait velocity 15.87 sec = 2.07 ft/sec     Standardized Balance Assessment   Standardized Balance Assessment Timed Up and Go Test;Dynamic Gait Index     Dynamic Gait Index   Level Surface Moderate Impairment   Change in Gait Speed Mild Impairment   Gait with Horizontal Head Turns Severe Impairment   Gait with Vertical Head Turns Moderate Impairment   Gait and Pivot Turn Mild Impairment   Step Over Obstacle  Mild Impairment   Step Around Obstacles Mild Impairment   Steps Mild Impairment   Total Score 12   DGI comment: Scores <19/24 indicate increased fall risk     Timed Up and Go Test   Normal TUG (seconds) 14.53   Manual TUG (seconds) 17.81   Cognitive TUG (seconds) 16.87   TUG Comments Scores >13.5-15 seconds indicate increased fall risk     RLE Tone   RLE Tone Mild     LLE Tone  LLE Tone Mild      Posterior push and release test:  Negative Anterior push and release test:  Negative  For both tests above, pt is able to maintain balance with ankle/hip strategies and does not take a step.      Objective measurements completed on examination: See above findings.                  PT Education - 11/21/16 0530    Education provided Yes   Education Details PT results/POC, rationale for large amplitude, deliberate and intense movement patterns, to be utilized with PWR! Moves as part of therapy; cues for increased R step length and relaxed UEs as a base for future gait training in PT   Person(s) Educated Patient;Spouse   Methods Explanation;Demonstration   Comprehension Verbalized understanding;Verbal cues required;Need further instruction          PT Short Term Goals - 11/21/16 0540      PT SHORT TERM GOAL #1   Title Pt will perform HEP with wife's supervision, to address Parkinson-specific deficits.  TARGET 12/22/16   Time 5   Period Weeks   Status New   Target Date 12/22/16     PT SHORT TERM GOAL #2   Title Pt will improve 5x sit<>stand to less than or equal to 15 seconds for improved efficiency and safety with transfers.   Time 5   Period Weeks   Status New   Target Date 12/22/16     PT SHORT TERM GOAL #3   Title Pt will improve TUG score to less than or equal to 13.5 seconds for decreased fall risk.   Time 5   Period Weeks   Status New   Target Date 12/22/16     PT SHORT TERM GOAL #4   Title Pt will improve DGI score to at least 15/24 for  decreased fall risk.   Time 5   Period Weeks   Status New   Target Date 12/22/16     PT SHORT TERM GOAL #5   Title Pt/wife will verbalize understanding of local Parkinson's disease resources.   Time 5   Period Weeks   Status New   Target Date 12/22/16           PT Long Term Goals - 11/21/16 0543      PT LONG TERM GOAL #1   Title Pt/wife will verbalize understanding of fall prevention in the home environment.  TARGET 01/19/17   Time 9   Period Weeks   Status New   Target Date 01/19/17     PT LONG TERM GOAL #2   Title Pt will perform sit<>stand from surfaces lower than 18", minimal UE support, at least 6 of 10 reps, modified independently, for improved car and low surface transfers.   Time 9   Period Weeks   Status New   Target Date 01/19/17     PT LONG TERM GOAL #3   Title Pt will improve TUG cognitive score to less tahn or equal to 15 seconds for decreased fall risk.   Time 9   Period Weeks   Status New   Target Date 01/19/17     PT LONG TERM GOAL #4   Title Pt will improve DGI score to at least 19/24 for decreased fall risk.   Time 9   Period Weeks   Status New   Target Date 01/19/17     PT LONG TERM GOAL #5   Title Pt will  verbalize plans for continued community fitness upon d/c from PT.   Time 9   Period Weeks   Status New   Target Date 01/19/17                Plan - 2016-12-04 0533    Clinical Impression Statement Pt is a 71 year old male with history of Parkinson's disease, dx 2009, who presents to OP PT with abnormal posture, rigidity in trunk and RUE/RLE, decreased flexibility, decreased strength, bradykinesia, decreased balance, decreased timing and coordination with gait.  Pt is at fall risk per TUG scores, DGI score and demo slowed gait velocity, as limited community ambulator.  He is limited in walking around yard and participating in [place] tasks.  He would benefit from skilled PT to address the above stated deficits to decrase fall risk,  improve functional mobility, and improve participation in activities outside the home.   History and Personal Factors relevant to plan of care: >3 co-morbidities, >3 systems involved, hx of falls   Clinical Presentation Evolving   Clinical Presentation due to: hx of Parkinson's disease, at fall risk per 3 TUG scores and DGI score   Clinical Decision Making Moderate   Rehab Potential Good   Clinical Impairments Affecting Rehab Potential scoliosis (not a surgical candidate) with chronic back pain   PT Frequency 2x / week  1x/wk for 1 week, then 2x/wk for 8 weeks   PT Duration Other (comment)  total POC = 9 weeks   PT Treatment/Interventions ADLs/Self Care Home Management;Gait training;DME Instruction;Functional mobility training;Therapeutic activities;Therapeutic exercise;Balance training;Neuromuscular re-education;Patient/family education   PT Next Visit Plan Initiate PWR! Moves (possibly standing or modified quadruped); work on posture and RLE foot clearance   Consulted and Agree with Plan of Care Patient;Family member/caregiver      Patient will benefit from skilled therapeutic intervention in order to improve the following deficits and impairments:  Abnormal gait, Decreased balance, Decreased mobility, Decreased strength, Difficulty walking, Impaired tone, Impaired flexibility, Postural dysfunction, Pain (PT will monitor pain, but will not specifically address as a goal, as pain is longstanding due to scoliosis)  Visit Diagnosis: Other abnormalities of gait and mobility  Other symptoms and signs involving the nervous system  Other symptoms and signs involving the musculoskeletal system  Abnormal posture  Unsteadiness on feet  Muscle weakness (generalized)      G-Codes - 12-04-2016 0547    Functional Assessment Tool Used (Outpatient Only) 5x:  17.06 sec, 2.07 ft/sec, DGI 12/24, TUG 14.53, TUG man 17.81,  TUG cog 16.87   Functional Limitation Mobility: Walking and moving around    Mobility: Walking and Moving Around Current Status [phone]) At least 40 percent but less than 60 percent impaired, limited or restricted   Mobility: Walking and Moving Around Goal Status [phone]) At least 20 percent but less than 40 percent impaired, limited or restricted       Problem List Patient Active Problem List   Diagnosis Date Noted  . Lumbar back pain with radiculopathy affecting right lower extremity 09/19/2016  . Elevated transaminase level 12/20/2015  . Chronic fatigue 12/02/2015  . Raynaud's phenomenon 04/16/2014  . Insomnia 03/31/2014  . Syncope 11/11/2012  . Autonomic postural hypotension 11/11/2012  . Parkinson's disease ([place]) 06/05/2012  . Restless leg syndrome 11/15/2011  . Primary hypothyroidism 09/11/2011  . Parkinsonism ([place]) 04/02/2011  . Depression, major, recurrent, in partial remission ([place]) 04/02/2011    Savien Mamula W. 12/04/16, 5:48 AM  Frazier Butt., PT   [facility] [address] [place], [place], [address] Phone: [phone]   Fax:  [phone]  Name: Kionte Baumgardner MRN: [id number] Date of Birth: January 26, 1946

## 2016-11-22 ENCOUNTER — Ambulatory Visit: Payer: Medicare Other | Admitting: Speech Pathology

## 2016-11-22 DIAGNOSIS — R1312 Dysphagia, oropharyngeal phase: Secondary | ICD-10-CM

## 2016-11-22 DIAGNOSIS — R471 Dysarthria and anarthria: Secondary | ICD-10-CM

## 2016-11-22 DIAGNOSIS — R2681 Unsteadiness on feet: Secondary | ICD-10-CM | POA: Diagnosis not present

## 2016-11-22 DIAGNOSIS — M6281 Muscle weakness (generalized): Secondary | ICD-10-CM | POA: Diagnosis not present

## 2016-11-22 DIAGNOSIS — R2689 Other abnormalities of gait and mobility: Secondary | ICD-10-CM | POA: Diagnosis not present

## 2016-11-22 DIAGNOSIS — R29898 Other symptoms and signs involving the musculoskeletal system: Secondary | ICD-10-CM | POA: Diagnosis not present

## 2016-11-22 DIAGNOSIS — R29818 Other symptoms and signs involving the nervous system: Secondary | ICD-10-CM | POA: Diagnosis not present

## 2016-11-22 DIAGNOSIS — R293 Abnormal posture: Secondary | ICD-10-CM | POA: Diagnosis not present

## 2016-11-22 NOTE — Therapy (Signed)
[facility] [address] [place], [place], [address] Phone: [phone]   Fax:  [phone]  Speech Language Pathology Evaluation  Patient Details  Name: [name] MRN: [id number] Date of Birth: [birth year] Referring Provider: Dr. [facility] Guiles Tat  Encounter Date: 11/22/2016      End of Session - 11/22/16 1339    Visit Number 1   Number of Visits 17   Date for SLP Re-Evaluation 01/19/17   Authorization Type may change re-eval date, as pt not scheudled to start ST for 2 weeks after eval   SLP Start Time 1240   SLP Stop Time  1326   SLP Time Calculation (min) 46 min   Activity Tolerance Patient tolerated treatment well      Past Medical History:  Diagnosis Date  . Allergy   . Anemia   . Depression   . Parkinson's disease ([place])   . Parkinson's disease ([place]) 06/05/2012  . Raynaud's syndrome   . Thyroid disease     Past Surgical History:  Procedure Laterality Date  . APPENDECTOMY    . FRACTURE SURGERY     jaw  . HERNIA REPAIR    . MANDIBLE FRACTURE SURGERY    . ROTATOR CUFF REPAIR    . TONSILLECTOMY    . VASECTOMY    . WRIST FRACTURE SURGERY      There were no vitals filed for this visit.      Subjective Assessment - 11/22/16 1251    Subjective "I get choked and I have trouble talking"   Patient is accompained by: Family member   Special Tests spouse, [name]   Currently in Pain? No/denies            SLP Evaluation [facility] - 11/22/16 1251      SLP Visit Information   SLP Received On 11/22/16   Referring Provider Dr. [place] Guiles Tat   Onset Date 10/26/16   Medical Diagnosis Parkinson's Disease     Subjective   Patient/Family Stated Goal "To improve my speech and swallowing habits"     General Information   HPI The patient was diagnosed with Parkinson's disease in 2009.  He cannot remember what presenting complaint was but wife thinks it was trouble with trouble with fine motor skills on the  right and he is right hand dominant.   MBSS 04/26/14 - Moderate pharyngeal dysphagia - decreased tongue base retraction, laryngeal elevation, vocal fold adduction, pharyngeal sensation, and consistent vallecular residue. Silent apsiration thin liquids Rec: Dysphagia 3 with nectar thick liquids, Frazier water protocol.   MBSS 02/24/16 - moderate pharyngeal  sensorimotor dyphagia with reduced tongue base retraction, wead linguial manipulation, vallecular residue, aspiration of thin and secretions. 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   Vocation Retired     Oral Motor/Sensory Function   Labial ROM Reduced left;Reduced right   Labial Symmetry Within Functional Limits   Labial Strength Within Functional Limits   Labial Coordination Reduced   Lingual ROM Reduced right;Reduced left   Lingual Symmetry Abnormal symmetry left   Lingual Strength Reduced   Lingual Coordination Reduced   Facial ROM Within Functional  Limits   Velum Within Functional Limits     Motor Speech   Overall Motor Speech Impaired   Respiration Within functional limits   Phonation Low vocal intensity   Resonance Within functional limits   Articulation Impaired   Level of Impairment Sentence   Intelligibility Intelligibility reduced   Word 75-100% accurate   Phrase 75-100% accurate   Sentence 75-100% accurate   Conversation 75-100% accurate   Motor Planning [place] functional limits   Interfering Components Hearing loss   Effective Techniques Slow rate;Increased vocal intensity;Over-articulate;Pause               Ice Chips - 11/22/16 1338       Ice Chips   Ice chips Not tested         Thin Liquid - 11/22/16 1338      Thin Liquid   Thin Liquid Impaired   Presentation Cup   Pharyngeal  Phase Impairments Suspected delayed Swallow;Throat Clearing - Delayed;Throat Clearing - Immediate                   SLP Short Term Goals - 11/22/16 1345      SLP SHORT TERM GOAL #1   Title Pt will average 90dB on loud "AH" with rare min A over 3 sessions.   Time 4   Period Weeks   Status New     SLP SHORT TERM GOAL #2   Title Pt will perform HEP for dysphagia and dysarthria with occasional min A over 3 sessions   Time 4   Period Weeks   Status New     SLP SHORT TERM GOAL #3   Title Pt will follow diet modificiations/swallow precautions with occasional min A over 2 sessions   Time 4   Period Weeks   Status New     SLP SHORT TERM GOAL #4   Title Pt will average 72dB on structured speech tasks 10/12 sentences with occasional min A   Time 4   Period Weeks   Status New     SLP SHORT TERM GOAL #5   Title Pt will utlize compensations for dysarthria/slur at sentence level 10/12 sentences with occasional min A          SLP Long Term Goals - 11/22/16 1351      SLP LONG TERM GOAL #1   Title Pt will average 92dB on loud "AH" over 3 sessions with rare min A   Time 8   Period Weeks   Status New     SLP LONG TERM GOAL #2   Title Pt will complete HEP for dysphagia and dysarthria with rare min A over 2 sessions   Time 8   Period Weeks   Status New     SLP LONG TERM GOAL #3   Title Spouse/pt will report carryover of swallow precautions/diet modifications outside of therapy with occasional min cues from wife, over 3 sessions   Time 8   Period Weeks   Status New     SLP LONG TERM GOAL #4   Title Pt will average 70 dB over 15 minute conversation with rare min A over 3 sessions   Time 8   Period Weeks   Status New     SLP LONG TERM GOAL #5   Title Pt will be audible and intelligible in noisy environment during  15 minute conversation over 2 sessions.   Time 8   Period Weeks   Status New  Plan - 11/22/16 1340    Clinical Impression Statement [name], a 71 y.o. male was diagnosed with PD in 2009. He is referred by his neurologist due to difficulty with speech and swallowing. 2 prior MBSS - see HPI. Moderate pharyngeal dysphagia with vallecuar residue and aspiration of thin liquids. Today with overt s/s of aspiration c/w MBSS on thin liquids. Pt is not completing dysphagia exercises provided by acute care ST, nor is he following swallow precautions despite spouse's cueing.    Spouse reports that she has to ask [name] to repeat himself throughout the day. She also reports that in the community [name] is frequently asked to repeat as other don't understand him in stores and restaurants.   Oral motor assessment revealed reduced lingual ROM and reduced lingual strength. Labial ROM was [facility] and strength was [facility]. Marland Kitchen Velar ROM appeared [facility]. Overall, reduced lingual coordinatoin  Measured when a sound level meter was placed 30 cm away from pt's mouth, 8 minutes of conversational speech was reduced today, at average 65dB (WNL= average 70-72dB) with range of 63 to 68dB. Overall speech intelligibility for this listener in a quiet environment was not affected, at approx 100%. Production of loud /a/ averaged 85dB (range of 80 to 90) and modeling, moderate cues usuall needed for loudness.   Pt then rated effort level at 7/10 for production of loud /a/ (10=maximal effort). In oral reading task pt was asked to use the same amount of effort as with loud /a/. Loudness average with this increased effort was 70dB (range of 68 to 72) with min A occasionally for loudness. Pt would benefit from skilled ST in order to improve speech intelligibility and swallow function pt's QOL and reduce risk of aspiration pna.    Speech Therapy Frequency 2x / week   Duration --  8 weeks   Treatment/Interventions SLP  instruction and feedback;Internal/external aids;Compensatory strategies;Environmental controls;Oral motor exercises;Patient/family education;Pharyngeal strengthening exercises;Functional tasks;Language facilitation   Potential to Achieve Goals Good   Potential Considerations Cooperation/participation level   SLP Home Exercise Plan Loud "AH", dysphagia HEP; dysarthria HEP   Consulted and Agree with Plan of Care Patient;Family member/caregiver   Family Member Consulted spouse [name]      Patient will benefit from skilled therapeutic intervention in order to improve the following deficits and impairments:   Dysphagia, oropharyngeal phase - Plan: SLP plan of care cert/re-cert  Dysarthria and anarthria - Plan: SLP plan of care cert/re-cert      G-Codes - 16/10/96 1355    Functional Limitations Motor speech   Motor Speech Current Status [phone]) At least 40 percent but less than 60 percent impaired, limited or restricted   Motor Speech Goal Status ([id number]) At least 20 percent but less than 40 percent impaired, limited or restricted      Problem List Patient Active Problem List   Diagnosis Date Noted  . Lumbar back pain with radiculopathy affecting right lower extremity 09/19/2016  . Elevated transaminase level 12/20/2015  . Chronic fatigue 12/02/2015  . Raynaud's phenomenon 04/16/2014  . Insomnia 03/31/2014  . Syncope 11/11/2012  . Autonomic postural hypotension 11/11/2012  . Parkinson's disease ([place]) 06/05/2012  . Restless leg syndrome 11/15/2011  . Primary hypothyroidism 09/11/2011  . Parkinsonism ([place]) 04/02/2011  . Depression, major, recurrent, in partial remission ([place]) 04/02/2011    [name], Annye Rusk 11/22/2016, 2:43 PM  [facility] [address] [facility] [place], [place], [address] Phone: [phone]   Fax:  [phone]  Name:  [name] MRN: [id number] Date of Birth: Oct 21, 1945

## 2016-11-22 NOTE — Patient Instructions (Signed)
Take a big breath before you speak or do your "AH!!"  We use good breath to generate volume and power our voice  Loud "AH!!" 5x twice a day with an effort of 7/10  Read sentences (10) twice a day focusing on breathing first, then loud volume    SLOW LOUD OVER-ENNUNCIATE PAUSE  Repeat each 5x slow and big - make each sound  [place]  POP TOP  BUTTERCUP  CATERPILLAR  BASEBALLL PLAYER  [place]  [place]  SLOW AND BIG - EXAGGERATE YOUR MOUTH, MAKE EACH CONSONANT  SWALLOWING EXERCISES 1. Effortful Swallows - Squeeze hard with the muscles in your neck while you swallow your  saliva or a sip of water - Repeat 20 times, 2-3 times a day, and use whenever you eat or drink  2. Masako Swallow - swallow with your tongue sticking out - Stick tongue out and gently bite tongue with your teeth - Swallow, while holding your tongue with your teeth - Repeat 20 times, 2-3 times a day  3. Pitch Raise - Repeat "he", once per second in as high of a pitch as you can - Repeat 20 times, 2-3 times a day  4. Shaker Exercise - head lift - Lie flat on your back in your bed or on a couch without pillows - Raise your head and look at your feet  - KEEP YOUR SHOULDERS DOWN - HOLD FOR 45 SECONDS, then lower your head back down - Repeat 3 times, 2-3 times a day  5. Mendelsohn Maneuver - "half swallow" exercise - Start to swallow, and keep your Adam's apple up by squeezing hard with the muscles of the throat - Hold the squeeze for 5-7 seconds and then relax - Repeat 20 times, 2-3 times a day  6. Tongue Press - Press your entire tongue as hard as you can against the roof of your mouth for 3-5 seconds - Repeat 20 times, 2-3 times a day  7. Tongue Stretch/Teeth Clean - Move your tongue around the pocket between your gums and teeth, clockwise and then counter-clockwise - Repeat on the back side, clockwise and then counter-clockwise - Repeat 15-20 times,  2-3 times a day  8. Breath Hold - Say "HUH!" loudly, holding your breath tightly at the level of your voice box for 3 seconds - Repeat 20 times, 2-3 times a day  9. Chin pushback - Open your mouth  - Place your fist UNDER your chin near your neck, and push back with your fist for 5 seconds  - Repeat 20 times, 2-3 times a day     10. Open mouth swallow          -Swallow with your mouth open wide          -Repeat 15 times, 2 times a day  11. Stick out your tongue and say "GA-GA-GA" loud and shart           -Repeat 25 sets of 3, 2-3 times a day  12. Say ING-GA loud and exaggerated             - 25 times 2-3 times a day  13. Gargle or pretend to gargle             - 10 times for 3-5 seconds (or as long as you can) 2 times a day   Soft solids, thin liquids; moisten dry solids Small bites Small sips Hard swallow  Dry swallow Alternate solids with liquids  Clear your throat intermittently Go slow Limit distractions (talking, TV, etc)

## 2016-11-27 ENCOUNTER — Ambulatory Visit: Payer: Medicare Other | Admitting: Occupational Therapy

## 2016-11-27 ENCOUNTER — Encounter: Payer: Self-pay | Admitting: Neurology

## 2016-11-27 DIAGNOSIS — R29818 Other symptoms and signs involving the nervous system: Secondary | ICD-10-CM | POA: Diagnosis not present

## 2016-11-27 DIAGNOSIS — R2689 Other abnormalities of gait and mobility: Secondary | ICD-10-CM | POA: Diagnosis not present

## 2016-11-27 DIAGNOSIS — R293 Abnormal posture: Secondary | ICD-10-CM

## 2016-11-27 DIAGNOSIS — R29898 Other symptoms and signs involving the musculoskeletal system: Secondary | ICD-10-CM | POA: Diagnosis not present

## 2016-11-27 DIAGNOSIS — M6281 Muscle weakness (generalized): Secondary | ICD-10-CM | POA: Diagnosis not present

## 2016-11-27 DIAGNOSIS — R2681 Unsteadiness on feet: Secondary | ICD-10-CM | POA: Diagnosis not present

## 2016-11-27 NOTE — Therapy (Signed)
[facility] [address] [place], [place], [address] Phone: [phone]   Fax:  [phone]  Occupational Therapy Treatment  Patient Details  Name: [name] MRN: [id number] Date of Birth: May 29, 1945 Referring Provider: Dr. Carles Collet  Encounter Date: 11/27/2016      OT End of Session - 11/27/16 1232    Visit Number 2   Number of Visits 17   Date for OT Re-Evaluation 01/19/17   Authorization Type MCR Primary - G code needed, UHC secondary   Authorization - Visit Number 2   Authorization - Number of Visits 10   OT Start Time 1015   OT Stop Time 1100   OT Time Calculation (min) 45 min   Activity Tolerance Patient tolerated treatment well      Past Medical History:  Diagnosis Date  . Allergy   . Anemia   . Depression   . Parkinson's disease ([place])   . Parkinson's disease ([place]) 06/05/2012  . Raynaud's syndrome   . Thyroid disease     Past Surgical History:  Procedure Laterality Date  . APPENDECTOMY    . FRACTURE SURGERY     jaw  . HERNIA REPAIR    . MANDIBLE FRACTURE SURGERY    . ROTATOR CUFF REPAIR    . TONSILLECTOMY    . VASECTOMY    . WRIST FRACTURE SURGERY      There were no vitals filed for this visit.      Subjective Assessment - 11/27/16 1028    Subjective  It was tough to do (re: PWR! Moves seated)    Patient is accompained by: Family member  wife   Pertinent History PD since 2009, spinal stenosis, scoliosis, HOH, Raynauds, h/o falls with radius fx in 2011?, Rt ankle fx 2017, Rt rotator cuff surgery 2003   Limitations fall risk, limited driving   Patient Stated Goals put on my watch   Currently in Pain? No/denies                      OT Treatments/Exercises (OP) - 11/27/16 0001      ADLs   ADL Comments Discussed and issued info on [employer] Parkinson's support group and national websites. Also reviewed avoiding protein/full meals 30 min. prior to and 1 hour after  taking Senimet. Pt/wife had questions about times to take meds and therapist recommended discussing further with MD as MD is managing this     Exercises   Exercises --  UBE x 5 min. keeping RPM > 40     Neurological Re-education Exercises   Other Exercises 1 PWR! Moves Seated basic 4 x 20 reps each with cueing for large amplitude movements including full elbow extension with PWR! [place] [employer], high steps for [employer]! Step                OT Education - 11/27/16 1231    Education provided Yes   Education Details POP info, [employer], PWR! Seated basic 4   Person(s) Educated Patient;Spouse   Methods Explanation;Demonstration;Verbal cues;Handout   Comprehension Verbalized understanding;Returned demonstration;Verbal cues required;Need further instruction          OT Short Term Goals - 11/20/16 1455      OT SHORT TERM GOAL #1   Title Pt/family will be independent with PD specific HEP including PWR! moves and coordination HEP    Time 4   Period Weeks   Status New   Target Date 12/20/16  OT SHORT TERM GOAL #2   Title Pt will write at least 3 sentences with no significant decr in size and 95% legibility or greater    Time 4   Period Weeks   Status New     OT SHORT TERM GOAL #3   Title Pt will perform PPT #2 test in 12 sec. or under   Baseline eval = 15.78 sec   Time 4   Period Weeks   Status New     OT SHORT TERM GOAL #4   Title Pt will perform PPT #4 test in 22 sec. or under   Baseline eval = 25.29 sec   Time 4   Period Weeks   Status New     OT SHORT TERM GOAL #5   Title Pt will increase coordination Rt hand as evidenced by performing 9 hole peg test in 32 sec. or less   Baseline eval = 36.90 sec   Time 4   Period Weeks   Status New     Additional Short Term Goals   Additional Short Term Goals Yes     OT SHORT TERM GOAL #6   Title Pt will increase RUE function as evidenced by performing 34 blocks or greater on Box & Blocks test   Baseline eval =  30 (Lt = 48)    Time 4   Period Weeks   Status New           OT Long Term Goals - 11/20/16 1501      OT LONG TERM GOAL #1   Title Pt will verbalize understanding of ways to prevent future PD related complications and appropriate community and national resources prn   Time 8   Period Weeks   Status New   Target Date 01/19/17     OT LONG TERM GOAL #2   Title Pt will verbalize understanding of adaptive strategies to incr ease with ADLS/IADLS   Time 8   Period Weeks   Status New     OT LONG TERM GOAL #3   Title Pt will perform PPT #4 in 19 sec. or under   Baseline eval = 25.29 sec   Time 8   Period Weeks   Status New     OT LONG TERM GOAL #4   Title Pt will improve functional coordination as evidenced by reducing time on 3 button test to 40 sec. or less   Baseline 59.01 sec. (standing with shirt on)    Time 8   Period Weeks   Status New     OT LONG TERM GOAL #5   Title Pt will verbalize understanding of ways to keep thinking skills sharp   Time 8   Period Weeks   Status New     Long Term Additional Goals   Additional Long Term Goals Yes     OT LONG TERM GOAL #6   Title Pt to report using RUE as dominant UE for ADLS/IADLS 60% of the time   Time 8   Period Weeks   Status New               Plan - 11/27/16 1232    Clinical Impression Statement Pt responding well to large amplitude movements in PWR! Seated but can benefit from review.    Rehab Potential Good   OT Frequency 2x / week   OT Duration 8 weeks   OT Treatment/Interventions Self-care/ADL training;DME and/or AE instruction;Patient/family education;Splinting;Therapeutic exercises;Therapeutic activities;Neuromuscular education;Functional Mobility Training;Passive range of  motion;Cognitive remediation/compensation;Visual/perceptual remediation/compensation;Manual Therapy   Plan review PWR! Seated, issue nutrition and constipation management info, PWR! Hands and coordination HEP    OT Home Exercise  Plan 11/27/16: PWR! Seated, POP info, national websites.    Consulted and Agree with Plan of Care Patient;Family member/caregiver   Family Member Consulted Wife      Patient will benefit from skilled therapeutic intervention in order to improve the following deficits and impairments:  Decreased coordination, Impaired flexibility, Improper body mechanics, Improper spinal/pelvic alignment, Decreased safety awareness, Decreased endurance, Decreased knowledge of precautions, Impaired tone, Impaired UE functional use, Pain, Decreased knowledge of use of DME, Decreased cognition, Decreased mobility, Decreased strength, Impaired perceived functional ability, Impaired vision/preception  Visit Diagnosis: Other symptoms and signs involving the nervous system  Other symptoms and signs involving the musculoskeletal system  Abnormal posture    Problem List Patient Active Problem List   Diagnosis Date Noted  . Lumbar back pain with radiculopathy affecting right lower extremity 09/19/2016  . Elevated transaminase level 12/20/2015  . Chronic fatigue 12/02/2015  . Raynaud's phenomenon 04/16/2014  . Insomnia 03/31/2014  . Syncope 11/11/2012  . Autonomic postural hypotension 11/11/2012  . Parkinson's disease ([place]) 06/05/2012  . Restless leg syndrome 11/15/2011  . Primary hypothyroidism 09/11/2011  . Parkinsonism ([place]) 04/02/2011  . Depression, major, recurrent, in partial remission ([place]) 04/02/2011    Carey Bullocks, OTR/L 11/27/2016, 12:36 PM  [facility] [address] [place], [place], [address] Phone: [phone]   Fax:  [phone]  Name: [name] MRN: [id number] Date of Birth: [birth year]

## 2016-11-29 ENCOUNTER — Ambulatory Visit: Payer: Medicare Other | Admitting: Physical Therapy

## 2016-11-29 ENCOUNTER — Ambulatory Visit: Payer: Medicare Other | Admitting: Occupational Therapy

## 2016-11-29 ENCOUNTER — Encounter: Payer: Self-pay | Admitting: Physical Therapy

## 2016-11-29 DIAGNOSIS — R278 Other lack of coordination: Secondary | ICD-10-CM

## 2016-11-29 DIAGNOSIS — R29818 Other symptoms and signs involving the nervous system: Secondary | ICD-10-CM | POA: Diagnosis not present

## 2016-11-29 DIAGNOSIS — R2689 Other abnormalities of gait and mobility: Secondary | ICD-10-CM | POA: Diagnosis not present

## 2016-11-29 DIAGNOSIS — R2681 Unsteadiness on feet: Secondary | ICD-10-CM

## 2016-11-29 DIAGNOSIS — R29898 Other symptoms and signs involving the musculoskeletal system: Secondary | ICD-10-CM | POA: Diagnosis not present

## 2016-11-29 DIAGNOSIS — M6281 Muscle weakness (generalized): Secondary | ICD-10-CM | POA: Diagnosis not present

## 2016-11-29 DIAGNOSIS — R293 Abnormal posture: Secondary | ICD-10-CM

## 2016-11-29 NOTE — Therapy (Signed)
[facility] [address] [place], [place], [address] Phone: [phone]   Fax:  [phone]  Occupational Therapy Treatment  Patient Details  Name: [name] MRN: [id number] Date of Birth: May 16, 1945 Referring Provider: Dr. Carles Collet  Encounter Date: 11/29/2016      OT End of Session - 11/29/16 1526    Visit Number 3   Number of Visits 17   Date for OT Re-Evaluation 01/19/17   Authorization Type MCR Primary - G code needed, UHC secondary   Authorization - Visit Number 3   Authorization - Number of Visits 10   OT Start Time 0935   OT Stop Time 1015   OT Time Calculation (min) 40 min   Activity Tolerance Patient tolerated treatment well   Behavior During Therapy [facility] for tasks assessed/performed      Past Medical History:  Diagnosis Date  . Allergy   . Anemia   . Depression   . Parkinson's disease ([place])   . Parkinson's disease ([place]) 06/05/2012  . Raynaud's syndrome   . Thyroid disease     Past Surgical History:  Procedure Laterality Date  . APPENDECTOMY    . FRACTURE SURGERY     jaw  . HERNIA REPAIR    . MANDIBLE FRACTURE SURGERY    . ROTATOR CUFF REPAIR    . TONSILLECTOMY    . VASECTOMY    . WRIST FRACTURE SURGERY      There were no vitals filed for this visit.      Subjective Assessment - 11/29/16 0939    Subjective  It was tough to do (re: PWR! Moves seated)    Patient is accompained by: Family member  wife   Pertinent History PD since 2009, spinal stenosis, scoliosis, HOH, Raynauds, h/o falls with radius fx in 2011?, Rt ankle fx 2017, Rt rotator cuff surgery 2003   Limitations fall risk, limited driving   Patient Stated Goals put on my watch   Currently in Pain? Yes   Pain Location --  right hip   Pain Orientation Right   Pain Descriptors / Indicators Aching   Pain Type Chronic pain   Pain Onset More than a month ago   Pain Frequency Intermittent   Aggravating Factors  lying down  in bed   Pain Relieving Factors tylenol       PWR! Moves (basic 4) in supine x 10-20 each with min cues For incr movement amplitude, avoid hyperextension of trunk with PWR! Up, and particularly for large amplitude for PWR! Step.  Educated pt/wife in PD exercise principles and benefits of aerobic exercise including cycling.  Recommended against boxing at this time due to rigidity, IR of R shoulder and shoulder position, as well as hx of RTC repair.  Also instructed pt in tips for constipation and movements affected by rigidity.  Pt/wife instructed in proper posture and use of trunk rotation/associated trunk movements with UE reaching to prevent future complications.  Pt/wife verbalized understanding.                          OT Education - 11/29/16 1523    Education Details PWR! Hands (basic 4); Tips for Constipation   Person(s) Educated Patient;Spouse   Methods Explanation;Verbal cues;Handout;Demonstration   Comprehension Verbalized understanding;Returned demonstration          OT Short Term Goals - 11/20/16 1455      OT SHORT TERM GOAL #1   Title Pt/family  will be independent with PD specific HEP including PWR! moves and coordination HEP    Time 4   Period Weeks   Status New   Target Date 12/20/16     OT SHORT TERM GOAL #2   Title Pt will write at least 3 sentences with no significant decr in size and 95% legibility or greater    Time 4   Period Weeks   Status New     OT SHORT TERM GOAL #3   Title Pt will perform PPT #2 test in 12 sec. or under   Baseline eval = 15.78 sec   Time 4   Period Weeks   Status New     OT SHORT TERM GOAL #4   Title Pt will perform PPT #4 test in 22 sec. or under   Baseline eval = 25.29 sec   Time 4   Period Weeks   Status New     OT SHORT TERM GOAL #5   Title Pt will increase coordination Rt hand as evidenced by performing 9 hole peg test in 32 sec. or less   Baseline eval = 36.90 sec   Time 4   Period Weeks    Status New     Additional Short Term Goals   Additional Short Term Goals Yes     OT SHORT TERM GOAL #6   Title Pt will increase RUE function as evidenced by performing 34 blocks or greater on Box & Blocks test   Baseline eval = 30 (Lt = 48)    Time 4   Period Weeks   Status New           OT Long Term Goals - 11/20/16 1501      OT LONG TERM GOAL #1   Title Pt will verbalize understanding of ways to prevent future PD related complications and appropriate community and national resources prn   Time 8   Period Weeks   Status New   Target Date 01/19/17     OT LONG TERM GOAL #2   Title Pt will verbalize understanding of adaptive strategies to incr ease with ADLS/IADLS   Time 8   Period Weeks   Status New     OT LONG TERM GOAL #3   Title Pt will perform PPT #4 in 19 sec. or under   Baseline eval = 25.29 sec   Time 8   Period Weeks   Status New     OT LONG TERM GOAL #4   Title Pt will improve functional coordination as evidenced by reducing time on 3 button test to 40 sec. or less   Baseline 59.01 sec. (standing with shirt on)    Time 8   Period Weeks   Status New     OT LONG TERM GOAL #5   Title Pt will verbalize understanding of ways to keep thinking skills sharp   Time 8   Period Weeks   Status New     Long Term Additional Goals   Additional Long Term Goals Yes     OT LONG TERM GOAL #6   Title Pt to report using RUE as dominant UE for ADLS/IADLS 62% of the time   Time 8   Period Weeks   Status New               Plan - 11/29/16 1018    Clinical Impression Statement Pt responds well to cueing for large amplitude movements.     Rehab Potential Good  OT Frequency 2x / week   OT Duration 8 weeks   OT Treatment/Interventions Self-care/ADL training;DME and/or AE instruction;Patient/family education;Splinting;Therapeutic exercises;Therapeutic activities;Neuromuscular education;Functional Mobility Training;Passive range of motion;Cognitive  remediation/compensation;Visual/perceptual remediation/compensation;Manual Therapy   Plan PWR! supine, review PWR! hands, coordination HEP if time   OT Home Exercise Plan 11/27/16: PWR! Seated, POP info, national websites.    Consulted and Agree with Plan of Care Patient;Family member/caregiver   Family Member Consulted Wife      Patient will benefit from skilled therapeutic intervention in order to improve the following deficits and impairments:  Decreased coordination, Impaired flexibility, Improper body mechanics, Improper spinal/pelvic alignment, Decreased safety awareness, Decreased endurance, Decreased knowledge of precautions, Impaired tone, Impaired UE functional use, Pain, Decreased knowledge of use of DME, Decreased cognition, Decreased mobility, Decreased strength, Impaired perceived functional ability, Impaired vision/preception  Visit Diagnosis: Other symptoms and signs involving the nervous system  Other symptoms and signs involving the musculoskeletal system  Abnormal posture  Other lack of coordination  Unsteadiness on feet    Problem List Patient Active Problem List   Diagnosis Date Noted  . Lumbar back pain with radiculopathy affecting right lower extremity 09/19/2016  . Elevated transaminase level 12/20/2015  . Chronic fatigue 12/02/2015  . Raynaud's phenomenon 04/16/2014  . Insomnia 03/31/2014  . Syncope 11/11/2012  . Autonomic postural hypotension 11/11/2012  . Parkinson's disease ([place]) 06/05/2012  . Restless leg syndrome 11/15/2011  . Primary hypothyroidism 09/11/2011  . Parkinsonism ([place]) 04/02/2011  . Depression, major, recurrent, in partial remission ([place]) 04/02/2011    [facility] 11/29/2016, 3:57 PM  [facility] [address] [place], [place], [address] Phone: [phone]   Fax:  [phone]  Name: [name] MRN: [id number] Date of Birth: [birth year]   Vianne Bulls,  OTR/L [facility] [address] [place],   [address] [phone] phone [phone] 11/29/16 3:57 PM
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
## 2016-11-29 NOTE — Patient Instructions (Addendum)
PWR! Hand Exercises  Then, start with elbows bent and hands closed:   PWR! Hands: Push hands out BIG. Elbows straight, wrists up, fingers open and spread apart BIG. (Can also perform by pushing down on table, chair, knees. Push above head, out to the side, behind you, in front of you.)   PWR! Step: Touch index finger to thumb while keeping other fingers straight. Flick fingers out BIG (thumb out/straighten fingers). Repeat with other fingers. (Step your thumb to each finger).   With arms stretched out in front of you (elbows straight), perform the following:   PWR! Rock:  Move wrists up and down [employer]! Twist: Twist palms up and down BIG    ** Make each movement big and deliberate so that you feel the movement.  Perform at least 10 repetitions 1x/day, but perform PWR! Hands throughout the day when you are having trouble using your hands (picking up/manipulating small objects, writing, eating, typing, sewing, buttoning, etc.).          Constipation and Parkinson's disease:  1.Rancho recipe for constipation in Parkinsons Disease:      -1 cup of unprocessed bran (need to get this at [employer], [employer] or similar type of store), 2 cups of applesauce in 1 cup of prune juice 2.  Increase fiber intake (vegetables) 3.  Regular, moderate exercise can be beneficial. 4. Increase water intake.  You should be drinking 1/2 gallon of water a day as long as you have not been diagnosed with congestive heart failure or renal/kidney failure.  This is probably the single greatest thing that you can do to help your constipation.

## 2016-11-29 NOTE — Patient Instructions (Signed)
  For all stretches, do these when your muscles are warm (after walking, after shower). Must hold longer than 10 seconds to get benefit of the stretch. Can do more than once a day.     Lumbar Rotation: Caudal - Bilateral (Supine)    Feet and knees together, arms outstretched, rotate knees left, turning head in opposite direction, until stretch is felt. Hold 30 seconds. Relax. Repeat 3 times per set. Do 1 sets per session. Do 5 sessions per day.  HIP: Hamstrings - Short Sitting    Rest leg on raised surface or on the floor. Keep knee straight. Lift chest. Hold __30_ seconds. _3__ reps per set, __1_ sets per day  Copyright  VHI. All rights reserved.   Hamstring Stretch    With other leg bent, foot flat, grasp right leg and slowly try to straighten knee. Hold __30__ seconds. Repeat __3__ times. Do __1__ sessions per day.   Suboccipital Stretch (Supine)    May rest your head on the floor or bed or use a small towel roll at base of skull and upper neck. Gently tuck chin until stretch is felt at base of skull and upper neck. Hold ___30_ seconds. Relax. Repeat ___3_ times per set. Do __1__ sets per session.

## 2016-12-05 ENCOUNTER — Ambulatory Visit: Payer: Medicare Other | Admitting: Occupational Therapy

## 2016-12-05 ENCOUNTER — Ambulatory Visit: Payer: Medicare Other | Admitting: Physical Therapy

## 2016-12-05 DIAGNOSIS — R2689 Other abnormalities of gait and mobility: Secondary | ICD-10-CM

## 2016-12-05 DIAGNOSIS — R29818 Other symptoms and signs involving the nervous system: Secondary | ICD-10-CM | POA: Diagnosis not present

## 2016-12-05 DIAGNOSIS — R293 Abnormal posture: Secondary | ICD-10-CM | POA: Diagnosis not present

## 2016-12-05 DIAGNOSIS — R2681 Unsteadiness on feet: Secondary | ICD-10-CM | POA: Diagnosis not present

## 2016-12-05 DIAGNOSIS — R29898 Other symptoms and signs involving the musculoskeletal system: Secondary | ICD-10-CM

## 2016-12-05 DIAGNOSIS — M6281 Muscle weakness (generalized): Secondary | ICD-10-CM | POA: Diagnosis not present
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
## 2016-12-05 NOTE — Therapy (Signed)
[facility] [address] [place], [place], [address] Phone: [phone]   Fax:  [phone]  Occupational Therapy Treatment  Patient Details  Name: [name] MRN: [id number] Date of Birth: Oct 25, 1945 Referring Provider: Dr. Carles Collet  Encounter Date: 12/05/2016      OT End of Session - 12/05/16 1300    Visit Number 4   Number of Visits 17   Date for OT Re-Evaluation 01/19/17   Authorization Type MCR Primary - G code needed, UHC secondary   Authorization - Visit Number 4   Authorization - Number of Visits 10   OT Start Time 1100   OT Stop Time 1145   OT Time Calculation (min) 45 min   Activity Tolerance Patient tolerated treatment well      Past Medical History:  Diagnosis Date  . Allergy   . Anemia   . Depression   . Parkinson's disease ([place])   . Parkinson's disease ([place]) 06/05/2012  . Raynaud's syndrome   . Thyroid disease     Past Surgical History:  Procedure Laterality Date  . APPENDECTOMY    . FRACTURE SURGERY     jaw  . HERNIA REPAIR    . MANDIBLE FRACTURE SURGERY    . ROTATOR CUFF REPAIR    . TONSILLECTOMY    . VASECTOMY    . WRIST FRACTURE SURGERY      There were no vitals filed for this visit.      Subjective Assessment - 12/05/16 1101    Subjective  Per wife, pt has been doing ex's   Patient is accompained by: Family member   Pertinent History PD since 2009, spinal stenosis, scoliosis, HOH, Raynauds, h/o falls with radius fx in 2011?, Rt ankle fx 2017, Rt rotator cuff surgery 2003   Limitations fall risk, limited driving   Patient Stated Goals put on my watch   Currently in Pain? Yes   Pain Score 5    Pain Location Hip   Pain Orientation Right   Pain Descriptors / Indicators Aching   Pain Type Chronic pain   Pain Onset More than a month ago   Pain Frequency Intermittent   Aggravating Factors  standing on it, laying on it   Pain Relieving Factors Tylenol                       OT Treatments/Exercises (OP) - 12/05/16 0001      ADLs   ADL Comments Pt/wife instructed in how best to carryover HEP's and distribute evenly over days at home. Pt/wife verbalized understanding. (will need ex chart eventually)      Neurological Re-education Exercises   Other Exercises 1 Pt performed PWR! Moves supine (basic 4) as review with cues for sequencing, large stepping and scooting (especially scooting to Lt) for PWR! Step. Pt was issued PWR! Supine for HEP but asked wife to help pt count for PWR! Step - wife agrees.    Other Exercises 2 Reviewed PWR! Hands basic 4 and performed each x 10 reps. Pt required cues for PWR! Step hands and cueing to fully extend Rt elbow and lift/spread fingers.                 OT Education - 12/05/16 1259    Education provided Yes   Education Details PWR! Supine (basic 4) with assist for PWR! Step    Person(s) Educated Patient   Methods Explanation;Demonstration;Handout;Verbal cues   Comprehension Verbalized understanding;Returned demonstration;Verbal cues  required          OT Short Term Goals - 12/05/16 1301      OT SHORT TERM GOAL #1   Title Pt/family will be independent with PD specific HEP including PWR! moves and coordination HEP    Time 4   Period Weeks   Status On-going     OT SHORT TERM GOAL #2   Title Pt will write at least 3 sentences with no significant decr in size and 95% legibility or greater    Time 4   Period Weeks   Status New     OT SHORT TERM GOAL #3   Title Pt will perform PPT #2 test in 12 sec. or under   Baseline eval = 15.78 sec   Time 4   Period Weeks   Status New     OT SHORT TERM GOAL #4   Title Pt will perform PPT #4 test in 22 sec. or under   Baseline eval = 25.29 sec   Time 4   Period Weeks   Status New     OT SHORT TERM GOAL #5   Title Pt will increase coordination Rt hand as evidenced by performing 9 hole peg test in 32 sec. or less   Baseline eval =  36.90 sec   Time 4   Period Weeks   Status New     OT SHORT TERM GOAL #6   Title Pt will increase RUE function as evidenced by performing 34 blocks or greater on Box & Blocks test   Baseline eval = 30 (Lt = 48)    Time 4   Period Weeks   Status New           OT Long Term Goals - 11/20/16 1501      OT LONG TERM GOAL #1   Title Pt will verbalize understanding of ways to prevent future PD related complications and appropriate community and national resources prn   Time 8   Period Weeks   Status New   Target Date 01/19/17     OT LONG TERM GOAL #2   Title Pt will verbalize understanding of adaptive strategies to incr ease with ADLS/IADLS   Time 8   Period Weeks   Status New     OT LONG TERM GOAL #3   Title Pt will perform PPT #4 in 19 sec. or under   Baseline eval = 25.29 sec   Time 8   Period Weeks   Status New     OT LONG TERM GOAL #4   Title Pt will improve functional coordination as evidenced by reducing time on 3 button test to 40 sec. or less   Baseline 59.01 sec. (standing with shirt on)    Time 8   Period Weeks   Status New     OT LONG TERM GOAL #5   Title Pt will verbalize understanding of ways to keep thinking skills sharp   Time 8   Period Weeks   Status New     Long Term Additional Goals   Additional Long Term Goals Yes     OT LONG TERM GOAL #6   Title Pt to report using RUE as dominant UE for ADLS/IADLS 31% of the time   Time 8   Period Weeks   Status New               Plan - 12/05/16 1301    Clinical Impression Statement Pt progressing with PWR! Moves  and responds well to cueing for large amplitude movements. Pt appears to have less dystonia RUE in PWR! Supine   Rehab Potential Good   OT Frequency 2x / week   OT Duration 8 weeks   OT Treatment/Interventions Self-care/ADL training;DME and/or AE instruction;Patient/family education;Splinting;Therapeutic exercises;Therapeutic activities;Neuromuscular education;Functional Mobility  Training;Passive range of motion;Cognitive remediation/compensation;Visual/perceptual remediation/compensation;Manual Therapy   Plan Coordination HEP and writing strategies, ?/consider taking out PWR! Twist Seated if pt compensates too much at shoulder and/or due to scoliosis    OT Home Exercise Plan 11/27/16: PWR! Seated, POP info, national websites. 11/29/16: PWR! Hands, tips for constipation. 12/05/16: PWR! Supine   Consulted and Agree with Plan of Care Patient;Family member/caregiver   Family Member Consulted Wife      Patient will benefit from skilled therapeutic intervention in order to improve the following deficits and impairments:  Decreased coordination, Impaired flexibility, Improper body mechanics, Improper spinal/pelvic alignment, Decreased safety awareness, Decreased endurance, Decreased knowledge of precautions, Impaired tone, Impaired UE functional use, Pain, Decreased knowledge of use of DME, Decreased cognition, Decreased mobility, Decreased strength, Impaired perceived functional ability, Impaired vision/preception  Visit Diagnosis: Other symptoms and signs involving the musculoskeletal system  Other symptoms and signs involving the nervous system  Abnormal posture    Problem List Patient Active Problem List   Diagnosis Date Noted  . Lumbar back pain with radiculopathy affecting right lower extremity 09/19/2016  . Elevated transaminase level 12/20/2015  . Chronic fatigue 12/02/2015  . Raynaud's phenomenon 04/16/2014  . Insomnia 03/31/2014  . Syncope 11/11/2012  . Autonomic postural hypotension 11/11/2012  . Parkinson's disease ([place]) 06/05/2012  . Restless leg syndrome 11/15/2011  . Primary hypothyroidism 09/11/2011  . Parkinsonism ([place]) 04/02/2011  . Depression, major, recurrent, in partial remission ([place]) 04/02/2011    Carey Bullocks, OTR/L 12/05/2016, 1:08 PM  [facility] [address] [place], [place], [address] Phone: [phone]   Fax:  [phone]  Name: [name] MRN: [id number] Date of Birth: [birth year]

## 2016-12-07 ENCOUNTER — Ambulatory Visit: Payer: Medicare Other | Admitting: Occupational Therapy

## 2016-12-07 DIAGNOSIS — R29818 Other symptoms and signs involving the nervous system: Secondary | ICD-10-CM | POA: Diagnosis not present

## 2016-12-07 DIAGNOSIS — M6281 Muscle weakness (generalized): Secondary | ICD-10-CM | POA: Diagnosis not present

## 2016-12-07 DIAGNOSIS — R293 Abnormal posture: Secondary | ICD-10-CM | POA: Diagnosis not present

## 2016-12-07 DIAGNOSIS — R2681 Unsteadiness on feet: Secondary | ICD-10-CM | POA: Diagnosis not present

## 2016-12-07 DIAGNOSIS — R29898 Other symptoms and signs involving the musculoskeletal system: Secondary | ICD-10-CM

## 2016-12-07 DIAGNOSIS — R2689 Other abnormalities of gait and mobility: Secondary | ICD-10-CM

## 2016-12-07 DIAGNOSIS — R278 Other lack of coordination: Secondary | ICD-10-CM

## 2016-12-07 NOTE — Patient Instructions (Addendum)
Coordination Exercises  Perform the following exercises for 20 minutes 1 times per day. Perform with both hand(s). Perform using big movements.   Flipping Cards: Place deck of cards on the table. Flip cards over by opening your hand big to grasp and then turn your palm up big, opening hand fully to release.  Deal cards: Hold 1/2 or whole deck in your hand. Use thumb to push card off top of deck with one big push.  Place card on tabletop. Then flick fingers (extend fingers) powerfully to slide card off table (can have chair/box below table to catch the cards).  Rotate ball with fingertips: Pick up with fingers/thumb and move as much as you can with each turn/movement (clockwise and counter-clockwise).  Toss ball from one hand to the other: Toss big/high.  Deliberately open with toss and deliberately close hand after catch.  Toss ball in the air and catch with the same hand: Toss big/high.  Deliberately open with toss and deliberately close hand after catch.  Rotate 2 golf balls in your hand: Both directions.  Pick up coins and stack one at a time: Pick up with big, intentional movements (open hand). Do not drag coin to the edge. (5-10 in a stack)  Pick up 5-10 coins one at a time and hold in palm. Then, move coins from palm to fingertips one at time and place in coin bank/container.  Practice writing: Slow down, write big, and focus on forming each letter.

## 2016-12-07 NOTE — Therapy (Signed)
[facility] [address] [place], [place], [address] Phone: [phone]   Fax:  [phone]  Occupational Therapy Treatment  Patient Details  Name: [name] MRN: [id number] Date of Birth: 08/24/1945 Referring Provider: Dr. Carles Collet  Encounter Date: 12/07/2016      OT End of Session - 12/07/16 0859    Visit Number 5   Number of Visits 17   Date for OT Re-Evaluation 01/19/17   Authorization Type MCR Primary - G code needed, UHC secondary   Authorization - Visit Number 5   Authorization - Number of Visits 10   OT Start Time 0855   OT Stop Time 0933   OT Time Calculation (min) 38 min   Activity Tolerance Patient tolerated treatment well   Behavior During Therapy [facility] for tasks assessed/performed      Past Medical History:  Diagnosis Date  . Allergy   . Anemia   . Depression   . Parkinson's disease ([place])   . Parkinson's disease ([place]) 06/05/2012  . Raynaud's syndrome   . Thyroid disease     Past Surgical History:  Procedure Laterality Date  . APPENDECTOMY    . FRACTURE SURGERY     jaw  . HERNIA REPAIR    . MANDIBLE FRACTURE SURGERY    . ROTATOR CUFF REPAIR    . TONSILLECTOMY    . VASECTOMY    . WRIST FRACTURE SURGERY      There were no vitals filed for this visit.      Subjective Assessment - 12/07/16 0858    Subjective  nothing new   Patient is accompained by: Family member   Pertinent History PD since 2009, spinal stenosis, scoliosis, HOH, Raynauds, h/o falls with radius fx in 2011?, Rt ankle fx 2017, Rt rotator cuff surgery 2003   Limitations fall risk, limited driving   Patient Stated Goals put on my watch   Currently in Pain? No/denies   Pain Onset More than a month ago                              OT Education - 12/07/16 1303    Education Details Coordination HEP with focus on large amplitude movements--see pt instructions   Person(s) Educated Patient   Methods Explanation;Demonstration;Handout;Verbal cues   Comprehension Verbalized understanding;Returned demonstration;Verbal cues required  min-mod cueing for timing and large amplitude          OT Short Term Goals - 12/05/16 1301      OT SHORT TERM GOAL #1   Title Pt/family will be independent with PD specific HEP including PWR! moves and coordination HEP    Time 4   Period Weeks   Status On-going     OT SHORT TERM GOAL #2   Title Pt will write at least 3 sentences with no significant decr in size and 95% legibility or greater    Time 4   Period Weeks   Status New     OT SHORT TERM GOAL #3   Title Pt will perform PPT #2 test in 12 sec. or under   Baseline eval = 15.78 sec   Time 4   Period Weeks   Status New     OT SHORT TERM GOAL #4   Title Pt will perform PPT #4 test in 22 sec. or under   Baseline eval = 25.29 sec   Time 4   Period Weeks   Status  New     OT SHORT TERM GOAL #5   Title Pt will increase coordination Rt hand as evidenced by performing 9 hole peg test in 32 sec. or less   Baseline eval = 36.90 sec   Time 4   Period Weeks   Status New     OT SHORT TERM GOAL #6   Title Pt will increase RUE function as evidenced by performing 34 blocks or greater on Box & Blocks test   Baseline eval = 30 (Lt = 48)    Time 4   Period Weeks   Status New           OT Long Term Goals - 11/20/16 1501      OT LONG TERM GOAL #1   Title Pt will verbalize understanding of ways to prevent future PD related complications and appropriate community and national resources prn   Time 8   Period Weeks   Status New   Target Date 01/19/17     OT LONG TERM GOAL #2   Title Pt will verbalize understanding of adaptive strategies to incr ease with ADLS/IADLS   Time 8   Period Weeks   Status New     OT LONG TERM GOAL #3   Title Pt will perform PPT #4 in 19 sec. or under   Baseline eval = 25.29 sec   Time 8   Period Weeks   Status New     OT LONG TERM GOAL #4   Title  Pt will improve functional coordination as evidenced by reducing time on 3 button test to 40 sec. or less   Baseline 59.01 sec. (standing with shirt on)    Time 8   Period Weeks   Status New     OT LONG TERM GOAL #5   Title Pt will verbalize understanding of ways to keep thinking skills sharp   Time 8   Period Weeks   Status New     Long Term Additional Goals   Additional Long Term Goals Yes     OT LONG TERM GOAL #6   Title Pt to report using RUE as dominant UE for ADLS/IADLS 95% of the time   Time 8   Period Weeks   Status New               Plan - 12/07/16 0902    Clinical Impression Statement Pt is progressing towards goals with incr awareness of movement.  Pt needed cueing for timing today, but improved with cues/repetition.   Rehab Potential Good   OT Frequency 2x / week   OT Duration 8 weeks   OT Treatment/Interventions Self-care/ADL training;DME and/or AE instruction;Patient/family education;Splinting;Therapeutic exercises;Therapeutic activities;Neuromuscular education;Functional Mobility Training;Passive range of motion;Cognitive remediation/compensation;Visual/perceptual remediation/compensation;Manual Therapy   Plan writing strategies, review PWR! sitting   OT Home Exercise Plan 11/27/16: PWR! Seated, POP info, national websites. 11/29/16: PWR! Hands, tips for constipation. 12/05/16: PWR! Supine   Consulted and Agree with Plan of Care Patient;Family member/caregiver   Family Member Consulted Wife      Patient will benefit from skilled therapeutic intervention in order to improve the following deficits and impairments:  Decreased coordination, Impaired flexibility, Improper body mechanics, Improper spinal/pelvic alignment, Decreased safety awareness, Decreased endurance, Decreased knowledge of precautions, Impaired tone, Impaired UE functional use, Pain, Decreased knowledge of use of DME, Decreased cognition, Decreased mobility, Decreased strength, Impaired perceived  functional ability, Impaired vision/preception  Visit Diagnosis: Other symptoms and signs involving the musculoskeletal system  Other symptoms  and signs involving the nervous system  Abnormal posture  Unsteadiness on feet  Other abnormalities of gait and mobility  Other lack of coordination    Problem List Patient Active Problem List   Diagnosis Date Noted  . Lumbar back pain with radiculopathy affecting right lower extremity 09/19/2016  . Elevated transaminase level 12/20/2015  . Chronic fatigue 12/02/2015  . Raynaud's phenomenon 04/16/2014  . Insomnia 03/31/2014  . Syncope 11/11/2012  . Autonomic postural hypotension 11/11/2012  . Parkinson's disease ([place]) 06/05/2012  . Restless leg syndrome 11/15/2011  . Primary hypothyroidism 09/11/2011  . Parkinsonism ([place]) 04/02/2011  . Depression, major, recurrent, in partial remission ([place]) 04/02/2011    [facility] 12/07/2016, 1:06 PM  [facility] [address] [place], [place], [address] Phone: [phone]   Fax:  [phone]  Name: Anibal Quinby MRN: [id number] Date of Birth: 13-Oct-1945   Vianne Bulls, OTR/L [facility] [address] [place], [place]  [phone] phone [phone] 12/07/16 1:06 PM

## 2016-12-08 ENCOUNTER — Ambulatory Visit: Payer: Medicare Other | Admitting: Physical Therapy

## 2016-12-08 DIAGNOSIS — R2681 Unsteadiness on feet: Secondary | ICD-10-CM | POA: Diagnosis not present

## 2016-12-08 DIAGNOSIS — R29818 Other symptoms and signs involving the nervous system: Secondary | ICD-10-CM | POA: Diagnosis not present

## 2016-12-08 DIAGNOSIS — R29898 Other symptoms and signs involving the musculoskeletal system: Secondary | ICD-10-CM | POA: Diagnosis not present

## 2016-12-08 DIAGNOSIS — R2689 Other abnormalities of gait and mobility: Secondary | ICD-10-CM | POA: Diagnosis not present

## 2016-12-08 DIAGNOSIS — M6281 Muscle weakness (generalized): Secondary | ICD-10-CM | POA: Diagnosis not present

## 2016-12-08 DIAGNOSIS — R293 Abnormal posture: Secondary | ICD-10-CM | POA: Diagnosis not present

## 2016-12-09 NOTE — Therapy (Signed)
[facility] [address] [place], [place], [address] Phone: [phone]   Fax:  [phone]  Physical Therapy Treatment  Patient Details  Name: [name] MRN: [id number] Date of Birth: 1945-09-11 Referring Provider: Alonza Bogus, DO  Encounter Date: 12/08/2016      PT End of Session - 12/09/16 1530    Visit Number 4   Number of Visits 17   Date for PT Re-Evaluation 01/19/17   Authorization Type Medicare, UHC   PT Start Time 0932   PT Stop Time 1015   PT Time Calculation (min) 43 min   Activity Tolerance Patient tolerated treatment well   Behavior During Therapy [name] for tasks assessed/performed      Past Medical History:  Diagnosis Date  . Allergy   . Anemia   . Depression   . Parkinson's disease ([place])   . Parkinson's disease ([place]) 06/05/2012  . Raynaud's syndrome   . Thyroid disease     Past Surgical History:  Procedure Laterality Date  . APPENDECTOMY    . FRACTURE SURGERY     jaw  . HERNIA REPAIR    . MANDIBLE FRACTURE SURGERY    . ROTATOR CUFF REPAIR    . TONSILLECTOMY    . VASECTOMY    . WRIST FRACTURE SURGERY      There were no vitals filed for this visit.      Subjective Assessment - 12/08/16 0930    Subjective No changes; went to [employer] store and got some things for manipulatives for hands.   Patient is accompained by: Family member  wife, [name]   Pertinent History anemia, depression, Parkinson's disease dx 2009, R foot fracture ([facility] for foot fracture in March 2018); R RTC repair, scoliosis (not a surgical candidate)   Patient Stated Goals Pt's goals for therapy include exercises for back, help with more mobility in R hand; be able to stand up straighter and walk better (per wife)   Currently in Pain? No/denies   Pain Onset --                         [name] Adult PT Treatment/Exercise - 12/09/16 1521      Ambulation/Gait   Ambulation/Gait Yes   Ambulation/Gait Assistance 5: Supervision   Ambulation/Gait Assistance Details Improved reciprocal arm swing and improved R foot clearance/heelstrike with foot-up brace   Ambulation Distance (Feet) 230 Feet  x 2   Assistive device None  trial of R foot-up brace   Gait Pattern Step-through pattern;Decreased arm swing - right;Decreased step length - right;Decreased dorsiflexion - right;Decreased trunk rotation;Trunk rotated posteriorly on right;Narrow base of support   Ambulation Surface Level   Gait velocity 9.81 sec with foot-up brace; 10.03 sec (3.27 ft/sec) with foot-up brace dis-engaged   Gait Comments Trial of foot up brace on RLE     High Level Balance   High Level Balance Comments Forward/back step and weightshift x 10 reps each leg with VCs and tactile cues at pelvis for "hinge" at hips to prevent posterior lean.  Side step and weightshift x 10 reps           PWR [facility]) - 12/09/16 1526    PWR! exercises Moves in quadraped  Modified quadruped at [employer]! Up x 10 reps   PWR! Rock x 10 reps   PWR! Twist x 10 reps each side   PWR! Step x 10 reps each side  Comments Modified quadruped PWR! Moves Basic 4 at counter, with visual and verbal cues for technique        Self care:     PT Education - 12/09/16 1529    Education provided Yes   Education Details Potential benefits of foot-up brace use on RLE, with noted improvements during trial of foot-up brace during session; provided patient/wife with website information on [place] foot-up brace  Modified quadruped added to HEP   Person(s) Educated Patient;Spouse   Methods Explanation;Demonstration;Handout   Comprehension Verbalized understanding          PT Short Term Goals - 11/21/16 0540      PT SHORT TERM GOAL #1   Title Pt will perform HEP with wife's supervision, to address Parkinson-specific deficits.  TARGET 12/22/16   Time 5   Period Weeks   Status New   Target Date 12/22/16     PT  SHORT TERM GOAL #2   Title Pt will improve 5x sit<>stand to less than or equal to 15 seconds for improved efficiency and safety with transfers.   Time 5   Period Weeks   Status New   Target Date 12/22/16     PT SHORT TERM GOAL #3   Title Pt will improve TUG score to less than or equal to 13.5 seconds for decreased fall risk.   Time 5   Period Weeks   Status New   Target Date 12/22/16     PT SHORT TERM GOAL #4   Title Pt will improve DGI score to at least 15/24 for decreased fall risk.   Time 5   Period Weeks   Status New   Target Date 12/22/16     PT SHORT TERM GOAL #5   Title Pt/wife will verbalize understanding of local Parkinson's disease resources.   Time 5   Period Weeks   Status New   Target Date 12/22/16           PT Long Term Goals - 11/21/16 0543      PT LONG TERM GOAL #1   Title Pt/wife will verbalize understanding of fall prevention in the home environment.  TARGET 01/19/17   Time 9   Period Weeks   Status New   Target Date 01/19/17     PT LONG TERM GOAL #2   Title Pt will perform sit<>stand from surfaces lower than 18", minimal UE support, at least 6 of 10 reps, modified independently, for improved car and low surface transfers.   Time 9   Period Weeks   Status New   Target Date 01/19/17     PT LONG TERM GOAL #3   Title Pt will improve TUG cognitive score to less tahn or equal to 15 seconds for decreased fall risk.   Time 9   Period Weeks   Status New   Target Date 01/19/17     PT LONG TERM GOAL #4   Title Pt will improve DGI score to at least 19/24 for decreased fall risk.   Time 9   Period Weeks   Status New   Target Date 01/19/17     PT LONG TERM GOAL #5   Title Pt will verbalize plans for continued community fitness upon d/c from PT.   Time 9   Period Weeks   Status New   Target Date 01/19/17               Plan - 12/09/16 1530    Clinical Impression Statement Trial of  R foot-up brace today with gait activities and  standing counter balance/PWR! Moves activities.  Pt seems to have improved placement and control of RLE, leading to improved coordinated reciprocal arm swing and fluidity of movement.   Rehab Potential Good   Clinical Impairments Affecting Rehab Potential scoliosis (not a surgical candidate) with chronic back pain   PT Frequency 2x / week  1x/wk for 1 week, then 2x/wk for 8 weeks   PT Duration Other (comment)  total POC = 9 weeks   PT Treatment/Interventions ADLs/Self Care Home Management;Gait training;DME Instruction;Functional mobility training;Therapeutic activities;Therapeutic exercise;Balance training;Neuromuscular re-education;Patient/family education   PT Next Visit Plan Trial again of foot-up brace, review Modified quadruped PWR! MOves as part of HEP, continue to work on foot clearance and arm swing.   Consulted and Agree with Plan of Care Patient;Family member/caregiver   Family Member Consulted wife      Patient will benefit from skilled therapeutic intervention in order to improve the following deficits and impairments:  Abnormal gait, Decreased balance, Decreased mobility, Decreased strength, Difficulty walking, Impaired tone, Impaired flexibility, Postural dysfunction, Pain (PT will monitor pain, but will not specifically address as a goal, as pain is longstanding due to scoliosis)  Visit Diagnosis: Other abnormalities of gait and mobility  Other symptoms and signs involving the nervous system  Unsteadiness on feet     Problem List Patient Active Problem List   Diagnosis Date Noted  . Lumbar back pain with radiculopathy affecting right lower extremity 09/19/2016  . Elevated transaminase level 12/20/2015  . Chronic fatigue 12/02/2015  . Raynaud's phenomenon 04/16/2014  . Insomnia 03/31/2014  . Syncope 11/11/2012  . Autonomic postural hypotension 11/11/2012  . Parkinson's disease ([place]) 06/05/2012  . Restless leg syndrome 11/15/2011  . Primary hypothyroidism 09/11/2011   . Parkinsonism ([place]) 04/02/2011  . Depression, major, recurrent, in partial remission ([place]) 04/02/2011    [name] W. 12/09/2016, 3:35 PM  Travis Butt., PT   [facility] [address] [place], [place], [address] Phone: [phone]   Fax:  [phone]  Name: [name] MRN: [id number] Date of Birth: [birth year]

## 2016-12-09 NOTE — Patient Instructions (Signed)
Handout provided for PWR! MOves in quadruped, 10-20 reps, 1x per day

## 2016-12-11 ENCOUNTER — Ambulatory Visit: Payer: Medicare Other | Admitting: Physical Therapy

## 2016-12-11 ENCOUNTER — Encounter: Payer: Self-pay | Admitting: Physical Therapy

## 2016-12-11 ENCOUNTER — Ambulatory Visit: Payer: Medicare Other | Admitting: Occupational Therapy

## 2016-12-11 DIAGNOSIS — R29818 Other symptoms and signs involving the nervous system: Secondary | ICD-10-CM

## 2016-12-11 DIAGNOSIS — R29898 Other symptoms and signs involving the musculoskeletal system: Secondary | ICD-10-CM

## 2016-12-11 DIAGNOSIS — R293 Abnormal posture: Secondary | ICD-10-CM

## 2016-12-11 DIAGNOSIS — M6281 Muscle weakness (generalized): Secondary | ICD-10-CM | POA: Diagnosis not present

## 2016-12-11 DIAGNOSIS — R2689 Other abnormalities of gait and mobility: Secondary | ICD-10-CM

## 2016-12-11 DIAGNOSIS — R2681 Unsteadiness on feet: Secondary | ICD-10-CM | POA: Diagnosis not present

## 2016-12-11 DIAGNOSIS — R278 Other lack of coordination: Secondary | ICD-10-CM

## 2016-12-11 NOTE — Therapy (Signed)
[facility] [address] [place], [place], [address] Phone: [phone]   Fax:  [phone]  Occupational Therapy Treatment  Patient Details  Name: [name] MRN: [id number] Date of Birth: 19-Mar-1945 Referring Provider: Dr. Carles Collet  Encounter Date: 12/11/2016      OT End of Session - 12/11/16 1220    Visit Number 6   Number of Visits 17   Date for OT Re-Evaluation 01/19/17   Authorization Type MCR Primary - G code needed, UHC secondary   Authorization - Visit Number 6   Authorization - Number of Visits 10   OT Start Time 1015   OT Stop Time 1100   OT Time Calculation (min) 45 min   Activity Tolerance Patient tolerated treatment well      Past Medical History:  Diagnosis Date  . Allergy   . Anemia   . Depression   . Parkinson's disease ([place])   . Parkinson's disease ([place]) 06/05/2012  . Raynaud's syndrome   . Thyroid disease     Past Surgical History:  Procedure Laterality Date  . APPENDECTOMY    . FRACTURE SURGERY     jaw  . HERNIA REPAIR    . MANDIBLE FRACTURE SURGERY    . ROTATOR CUFF REPAIR    . TONSILLECTOMY    . VASECTOMY    . WRIST FRACTURE SURGERY      There were no vitals filed for this visit.      Subjective Assessment - 12/11/16 1014    Patient is accompained by: Family member   Pertinent History PD since 2009, spinal stenosis, scoliosis, HOH, Raynauds, h/o falls with radius fx in 2011?, Rt ankle fx 2017, Rt rotator cuff surgery 2003   Limitations fall risk, limited driving   Patient Stated Goals put on my watch   Currently in Pain? Yes   Pain Score 3    Pain Location Hip   Pain Orientation Right   Pain Descriptors / Indicators Aching   Pain Type Chronic pain   Pain Onset More than a month ago   Pain Frequency Intermittent   Aggravating Factors  standing on it, laying on it   Pain Relieving Factors tylennol                      OT Treatments/Exercises  (OP) - 12/11/16 0001      ADLs   Writing Issued handwriting strategies and reviewed. Pt practiced writing using strategies with extra time, but 100% legibility and only slight decr. in letter size on one sentence. Pt wrote 5 sentences     Fine Motor Coordination   Other Fine Motor Exercises Reviewed coordination HEP with cues for large amplitude movements (especially for rotating ball in fingertips)      Neurological Re-education Exercises   Other Exercises 1 Reviewed PWR! Seated (basic 4) x 10 reps each. Pt required cueing for PWR! Rock to lean over with trunk to prevent sh. compensation, and for full elbow extension RUE. PWR! Twist with good positioning and no compensations noted                OT Education - 12/11/16 1221    Education provided Yes   Education Details handwriting strategies   Person(s) Educated Patient;Spouse   Methods Explanation;Demonstration;Handout;Verbal cues   Comprehension Verbalized understanding;Returned demonstration;Verbal cues required          OT Short Term Goals - 12/11/16 1221      OT SHORT TERM  GOAL #1   Title Pt/family will be independent with PD specific HEP including PWR! moves and coordination HEP    Time 4   Period Weeks   Status Achieved     OT SHORT TERM GOAL #2   Title Pt will write at least 3 sentences with no significant decr in size and 95% legibility or greater    Time 4   Period Weeks   Status Achieved     OT SHORT TERM GOAL #3   Title Pt will perform PPT #2 test in 12 sec. or under   Baseline eval = 15.78 sec   Time 4   Period Weeks   Status On-going     OT SHORT TERM GOAL #4   Title Pt will perform PPT #4 test in 22 sec. or under   Baseline eval = 25.29 sec   Time 4   Period Weeks   Status On-going     OT SHORT TERM GOAL #5   Title Pt will increase coordination Rt hand as evidenced by performing 9 hole peg test in 32 sec. or less   Baseline eval = 36.90 sec   Time 4   Period Weeks   Status On-going      OT SHORT TERM GOAL #6   Title Pt will increase RUE function as evidenced by performing 34 blocks or greater on Box & Blocks test   Baseline eval = 30 (Lt = 48)    Time 4   Period Weeks   Status New           OT Long Term Goals - 11/20/16 1501      OT LONG TERM GOAL #1   Title Pt will verbalize understanding of ways to prevent future PD related complications and appropriate community and national resources prn   Time 8   Period Weeks   Status New   Target Date 01/19/17     OT LONG TERM GOAL #2   Title Pt will verbalize understanding of adaptive strategies to incr ease with ADLS/IADLS   Time 8   Period Weeks   Status New     OT LONG TERM GOAL #3   Title Pt will perform PPT #4 in 19 sec. or under   Baseline eval = 25.29 sec   Time 8   Period Weeks   Status New     OT LONG TERM GOAL #4   Title Pt will improve functional coordination as evidenced by reducing time on 3 button test to 40 sec. or less   Baseline 59.01 sec. (standing with shirt on)    Time 8   Period Weeks   Status New     OT LONG TERM GOAL #5   Title Pt will verbalize understanding of ways to keep thinking skills sharp   Time 8   Period Weeks   Status New     Long Term Additional Goals   Additional Long Term Goals Yes     OT LONG TERM GOAL #6   Title Pt to report using RUE as dominant UE for ADLS/IADLS 86% of the time   Time 8   Period Weeks   Status New               Plan - 12/11/16 1222    Clinical Impression Statement Pt met STG's #1 and #2. Pt progressing towards remaining STG's.    Rehab Potential Good   OT Frequency 2x / week   OT Duration 8 weeks  OT Treatment/Interventions Self-care/ADL training;DME and/or AE instruction;Patient/family education;Splinting;Therapeutic exercises;Therapeutic activities;Neuromuscular education;Functional Mobility Training;Passive range of motion;Cognitive remediation/compensation;Visual/perceptual remediation/compensation;Manual Therapy   Plan  adaptive strategies for ADLS including donning/doffing jacket, cutting food, etc., Functional standing with large stepping   OT Home Exercise Plan 11/27/16: PWR! Seated, POP info, national websites. 11/29/16: PWR! Hands, tips for constipation. 12/05/16: PWR! Supine. 12/07/16: coordination HEP. 12/11/16: Writing strategies   Consulted and Agree with Plan of Care Patient;Family member/caregiver   Family Member Consulted Wife      Patient will benefit from skilled therapeutic intervention in order to improve the following deficits and impairments:  Decreased coordination, Impaired flexibility, Improper body mechanics, Improper spinal/pelvic alignment, Decreased safety awareness, Decreased endurance, Decreased knowledge of precautions, Impaired tone, Impaired UE functional use, Pain, Decreased knowledge of use of DME, Decreased cognition, Decreased mobility, Decreased strength, Impaired perceived functional ability, Impaired vision/preception  Visit Diagnosis: Other symptoms and signs involving the nervous system  Other symptoms and signs involving the musculoskeletal system  Abnormal posture  Other lack of coordination    Problem List Patient Active Problem List   Diagnosis Date Noted  . Lumbar back pain with radiculopathy affecting right lower extremity 09/19/2016  . Elevated transaminase level 12/20/2015  . Chronic fatigue 12/02/2015  . Raynaud's phenomenon 04/16/2014  . Insomnia 03/31/2014  . Syncope 11/11/2012  . Autonomic postural hypotension 11/11/2012  . Parkinson's disease ([place]) 06/05/2012  . Restless leg syndrome 11/15/2011  . Primary hypothyroidism 09/11/2011  . Parkinsonism ([place]) 04/02/2011  . Depression, major, recurrent, in partial remission ([place]) 04/02/2011    Carey Bullocks, OTR/L 12/11/2016, 12:26 PM  [facility] [address] [place], [place], [address] Phone: [phone]   Fax:   [phone]  Name: [name] MRN: [id number] Date of Birth: 11/26/1945

## 2016-12-11 NOTE — Therapy (Signed)
[facility] [address] [place], [place], [address] Phone: [phone]   Fax:  [phone]  Physical Therapy Treatment  Patient Details  Name: [name] MRN: [id number] Date of Birth: 08-Jul-1945 Referring Provider: Alonza Bogus, DO  Encounter Date: 12/11/2016      PT End of Session - 12/11/16 1913    Visit Number 5   Number of Visits 17   Date for PT Re-Evaluation 01/19/17   Authorization Type Medicare, UHC   PT Start Time 0930   PT Stop Time 1015   PT Time Calculation (min) 45 min   Activity Tolerance Patient tolerated treatment well   Behavior During Therapy [facility] for tasks assessed/performed      Past Medical History:  Diagnosis Date  . Allergy   . Anemia   . Depression   . Parkinson's disease ([place])   . Parkinson's disease ([place]) 06/05/2012  . Raynaud's syndrome   . Thyroid disease     Past Surgical History:  Procedure Laterality Date  . APPENDECTOMY    . FRACTURE SURGERY     jaw  . HERNIA REPAIR    . MANDIBLE FRACTURE SURGERY    . ROTATOR CUFF REPAIR    . TONSILLECTOMY    . VASECTOMY    . WRIST FRACTURE SURGERY      There were no vitals filed for this visit.      Subjective Assessment - 12/11/16 0931    Subjective Trying to do exercises and not doing as much as he should.    Patient is accompained by: Family member  wife, [name]   Pertinent History anemia, depression, Parkinson's disease dx 2009, R foot fracture ([facility] PT for foot fracture in March 2018); R RTC repair, scoliosis (not a surgical candidate)   Patient Stated Goals Pt's goals for therapy include exercises for back, help with more mobility in R hand; be able to stand up straighter and walk better (per wife)   Currently in Pain? Yes   Pain Score 3    Pain Location Hip   Pain Orientation Right   Pain Descriptors / Indicators Aching   Pain Type Chronic pain   Pain Onset More than a month ago   Pain  Frequency Intermittent                         [facility] Adult PT Treatment/Exercise - 12/11/16 1856      Ambulation/Gait   Ambulation/Gait Assistance 5: Supervision   Ambulation/Gait Assistance Details improved rt foot clearance with foot up brace; incr rt arm swing with vc    Ambulation Distance (Feet) 200 Feet  x2   Assistive device None   Gait Pattern Step-through pattern;Decreased arm swing - right;Decreased step length - right;Decreased dorsiflexion - right;Decreased trunk rotation;Trunk rotated posteriorly on right;Narrow base of support   Ambulation Surface Level   Gait Comments Use of foot up brace on rt; educated pt and wife on donning brace; pt reports he wants to get a brace and plans to use info previously provided to order one           PWR [facility]) - 12/11/16 1906    PWR! Up 10   PWR! Rock 15   PWR! Twist x 10 reps each side   PWR! Step x 10 reps each side   Comments modified quadruped at counter, table with max visual, verbal,, and tactile cues for technique  PT Education - 12/11/16 1912    Education provided Yes   Education Details use of exercise grid to organize exercises from all 3 disciplines   Person(s) Educated Patient;Spouse   Methods Explanation;Demonstration;Handout   Comprehension Verbalized understanding;Need further instruction          PT Short Term Goals - 11/21/16 0540      PT SHORT TERM GOAL #1   Title Pt will perform HEP with wife's supervision, to address Parkinson-specific deficits.  TARGET 12/22/16   Time 5   Period Weeks   Status New   Target Date 12/22/16     PT SHORT TERM GOAL #2   Title Pt will improve 5x sit<>stand to less than or equal to 15 seconds for improved efficiency and safety with transfers.   Time 5   Period Weeks   Status New   Target Date 12/22/16     PT SHORT TERM GOAL #3   Title Pt will improve TUG score to less than or equal to 13.5 seconds for decreased fall risk.   Time 5    Period Weeks   Status New   Target Date 12/22/16     PT SHORT TERM GOAL #4   Title Pt will improve DGI score to at least 15/24 for decreased fall risk.   Time 5   Period Weeks   Status New   Target Date 12/22/16     PT SHORT TERM GOAL #5   Title Pt/wife will verbalize understanding of local Parkinson's disease resources.   Time 5   Period Weeks   Status New   Target Date 12/22/16           PT Long Term Goals - 11/21/16 0543      PT LONG TERM GOAL #1   Title Pt/wife will verbalize understanding of fall prevention in the home environment.  TARGET 01/19/17   Time 9   Period Weeks   Status New   Target Date 01/19/17     PT LONG TERM GOAL #2   Title Pt will perform sit<>stand from surfaces lower than 18", minimal UE support, at least 6 of 10 reps, modified independently, for improved car and low surface transfers.   Time 9   Period Weeks   Status New   Target Date 01/19/17     PT LONG TERM GOAL #3   Title Pt will improve TUG cognitive score to less tahn or equal to 15 seconds for decreased fall risk.   Time 9   Period Weeks   Status New   Target Date 01/19/17     PT LONG TERM GOAL #4   Title Pt will improve DGI score to at least 19/24 for decreased fall risk.   Time 9   Period Weeks   Status New   Target Date 01/19/17     PT LONG TERM GOAL #5   Title Pt will verbalize plans for continued community fitness upon d/c from PT.   Time 9   Period Weeks   Status New   Target Date 01/19/17               Plan - 12/11/16 1914    Clinical Impression Statement session focused on gait training with foot up brace (for incr foot clearance and step length), education in use of exercise grid to organize exercises from all 3 disciplines and which days to perform each, and instruction in PWR exercises for improving ROM in trunk and extremities to carryover to incr  ease of functional tasks. Patient continues to show improvements as he makes progress towards goals.     Rehab Potential Good   Clinical Impairments Affecting Rehab Potential scoliosis (not a surgical candidate) with chronic back pain   PT Frequency 2x / week  1x/wk for 1 week, then 2x/wk for 8 weeks   PT Duration Other (comment)  total POC = 9 weeks   PT Treatment/Interventions ADLs/Self Care Home Management;Gait training;DME Instruction;Functional mobility training;Therapeutic activities;Therapeutic exercise;Balance training;Neuromuscular re-education;Patient/family education   PT Next Visit Plan Trial again of foot-up brace (check if pt has ordered for himself), review Modified quadruped PWR! MOves as part of HEP (required max cues 10/22), continue to work on foot clearance and arm swing.   Consulted and Agree with Plan of Care Patient;Family member/caregiver   Family Member Consulted wife      Patient will benefit from skilled therapeutic intervention in order to improve the following deficits and impairments:  Abnormal gait, Decreased balance, Decreased mobility, Decreased strength, Difficulty walking, Impaired tone, Impaired flexibility, Postural dysfunction, Pain (PT will monitor pain, but will not specifically address as a goal, as pain is longstanding due to scoliosis)  Visit Diagnosis: Other abnormalities of gait and mobility  Other symptoms and signs involving the nervous system  Other symptoms and signs involving the musculoskeletal system     Problem List Patient Active Problem List   Diagnosis Date Noted  . Lumbar back pain with radiculopathy affecting right lower extremity 09/19/2016  . Elevated transaminase level 12/20/2015  . Chronic fatigue 12/02/2015  . Raynaud's phenomenon 04/16/2014  . Insomnia 03/31/2014  . Syncope 11/11/2012  . Autonomic postural hypotension 11/11/2012  . Parkinson's disease ([place]) 06/05/2012  . Restless leg syndrome 11/15/2011  . Primary hypothyroidism 09/11/2011  . Parkinsonism ([place]) 04/02/2011  . Depression, major, recurrent, in partial  remission ([place]) 04/02/2011    Rexanne Mano, PT 12/11/2016, 7:19 PM  [facility] [address] [place], [place], [address] Phone: [phone]   Fax:  [phone]  Name: Buddie Marston MRN: [id number] Date of Birth: 02/06/1946

## 2016-12-11 NOTE — Patient Instructions (Addendum)
(  Exercise) Monday Tuesday Wednesday Thursday Friday Saturday Sunday   PWR All 4's           PWR  seated           PWR  Supine (lying down)           PT stretches                      OT coordination           OT hand exercises                      Speech swallow           Speech Voice

## 2016-12-13 ENCOUNTER — Ambulatory Visit: Payer: Medicare Other | Admitting: Occupational Therapy

## 2016-12-13 ENCOUNTER — Ambulatory Visit: Payer: Medicare Other

## 2016-12-13 ENCOUNTER — Encounter: Payer: Self-pay | Admitting: Physical Therapy

## 2016-12-13 ENCOUNTER — Ambulatory Visit: Payer: Medicare Other | Admitting: Physical Therapy

## 2016-12-13 DIAGNOSIS — R29818 Other symptoms and signs involving the nervous system: Secondary | ICD-10-CM

## 2016-12-13 DIAGNOSIS — R2681 Unsteadiness on feet: Secondary | ICD-10-CM

## 2016-12-13 DIAGNOSIS — R29898 Other symptoms and signs involving the musculoskeletal system: Secondary | ICD-10-CM

## 2016-12-13 DIAGNOSIS — R1312 Dysphagia, oropharyngeal phase: Secondary | ICD-10-CM

## 2016-12-13 DIAGNOSIS — R293 Abnormal posture: Secondary | ICD-10-CM

## 2016-12-13 DIAGNOSIS — R2689 Other abnormalities of gait and mobility: Secondary | ICD-10-CM

## 2016-12-13 DIAGNOSIS — R471 Dysarthria and anarthria: Secondary | ICD-10-CM

## 2016-12-13 DIAGNOSIS — M6281 Muscle weakness (generalized): Secondary | ICD-10-CM | POA: Diagnosis not present

## 2016-12-13 DIAGNOSIS — R278 Other lack of coordination: Secondary | ICD-10-CM

## 2016-12-13 NOTE — Patient Instructions (Signed)
  WHEN YOU EAT:   Multiple dry swallows after each bite/sip   Follow solids with liquid   Clear throat intermittently   Slow rate   Small sips/bites

## 2016-12-13 NOTE — Therapy (Signed)
[facility] [address] [place], [place], [address] Phone: [phone]   Fax:  [phone]  Occupational Therapy Treatment  Patient Details  Name: [name] MRN: [id number] Date of Birth: 04-13-1945 Referring Provider: Dr. Carles Collet  Encounter Date: 12/13/2016      OT End of Session - 12/13/16 1222    Visit Number 7   Number of Visits 17   Date for OT Re-Evaluation 01/19/17   Authorization Type MCR Primary - G code needed, UHC secondary   Authorization - Visit Number 7   Authorization - Number of Visits 10   OT Start Time 1020   OT Stop Time 1100   OT Time Calculation (min) 40 min   Activity Tolerance Patient tolerated treatment well      Past Medical History:  Diagnosis Date  . Allergy   . Anemia   . Depression   . Parkinson's disease ([place])   . Parkinson's disease ([place]) 06/05/2012  . Raynaud's syndrome   . Thyroid disease     Past Surgical History:  Procedure Laterality Date  . APPENDECTOMY    . FRACTURE SURGERY     jaw  . HERNIA REPAIR    . MANDIBLE FRACTURE SURGERY    . ROTATOR CUFF REPAIR    . TONSILLECTOMY    . VASECTOMY    . WRIST FRACTURE SURGERY      There were no vitals filed for this visit.      Subjective Assessment - 12/13/16 1019    Subjective  I need clarification on the PWR! ex's on my back    Patient is accompained by: Family member   Pertinent History PD since 2009, spinal stenosis, scoliosis, HOH, Raynauds, h/o falls with radius fx in 2011?, Rt ankle fx 2017, Rt rotator cuff surgery 2003   Limitations fall risk, limited driving   Patient Stated Goals put on my watch   Currently in Pain? Yes   Pain Score 2    Pain Location Hip   Pain Orientation Right   Pain Descriptors / Indicators Aching   Pain Type Chronic pain   Pain Onset More than a month ago   Pain Frequency Intermittent   Aggravating Factors  standing on it, laying on it   Pain Relieving Factors tylenot,  movement                      OT Treatments/Exercises (OP) - 12/13/16 0001      ADLs   ADL Comments Began education on movement strategies for ADLS and issued handout/reviewed. Pt practiced donning/doffing jacket but will need further practice for re-inforcement. Discussed techniques for eating, cutting food, opening jars/bottles, and practiced sit-stand technique.      Neurological Re-education Exercises   Other Exercises 1 Reviewed PWR! supine (basic 4) - pt required clarification on PWR! [place], and [place]! Step (for sequencing). Pt performed each x 10 reps. Pt/wife also educated on counting out loud for voice volume and to keep track of repetitions with each ex and how to count for each ex                OT Education - 12/13/16 1218    Education provided Yes   Education Details Performing daily activities with big movements/ADL strategies   Person(s) Educated Patient;Spouse   Methods Explanation   Comprehension Verbalized understanding          OT Short Term Goals - 12/11/16 1221  OT SHORT TERM GOAL #1   Title Pt/family will be independent with PD specific HEP including PWR! moves and coordination HEP    Time 4   Period Weeks   Status Achieved     OT SHORT TERM GOAL #2   Title Pt will write at least 3 sentences with no significant decr in size and 95% legibility or greater    Time 4   Period Weeks   Status Achieved     OT SHORT TERM GOAL #3   Title Pt will perform PPT #2 test in 12 sec. or under   Baseline eval = 15.78 sec   Time 4   Period Weeks   Status On-going     OT SHORT TERM GOAL #4   Title Pt will perform PPT #4 test in 22 sec. or under   Baseline eval = 25.29 sec   Time 4   Period Weeks   Status On-going     OT SHORT TERM GOAL #5   Title Pt will increase coordination Rt hand as evidenced by performing 9 hole peg test in 32 sec. or less   Baseline eval = 36.90 sec   Time 4   Period Weeks   Status On-going     OT SHORT TERM  GOAL #6   Title Pt will increase RUE function as evidenced by performing 34 blocks or greater on Box & Blocks test   Baseline eval = 30 (Lt = 48)    Time 4   Period Weeks   Status New           OT Long Term Goals - 11/20/16 1501      OT LONG TERM GOAL #1   Title Pt will verbalize understanding of ways to prevent future PD related complications and appropriate community and national resources prn   Time 8   Period Weeks   Status New   Target Date 01/19/17     OT LONG TERM GOAL #2   Title Pt will verbalize understanding of adaptive strategies to incr ease with ADLS/IADLS   Time 8   Period Weeks   Status New     OT LONG TERM GOAL #3   Title Pt will perform PPT #4 in 19 sec. or under   Baseline eval = 25.29 sec   Time 8   Period Weeks   Status New     OT LONG TERM GOAL #4   Title Pt will improve functional coordination as evidenced by reducing time on 3 button test to 40 sec. or less   Baseline 59.01 sec. (standing with shirt on)    Time 8   Period Weeks   Status New     OT LONG TERM GOAL #5   Title Pt will verbalize understanding of ways to keep thinking skills sharp   Time 8   Period Weeks   Status New     Long Term Additional Goals   Additional Long Term Goals Yes     OT LONG TERM GOAL #6   Title Pt to report using RUE as dominant UE for ADLS/IADLS 24% of the time   Time 8   Period Weeks   Status New               Plan - 12/13/16 1223    Clinical Impression Statement Pt requires repetition and review to perform PWR! moves properly. Pt however is doing HEP's at home per wife report.    Rehab Potential Good  OT Frequency 2x / week   OT Duration 8 weeks   OT Treatment/Interventions Self-care/ADL training;DME and/or AE instruction;Patient/family education;Splinting;Therapeutic exercises;Therapeutic activities;Neuromuscular education;Functional Mobility Training;Passive range of motion;Cognitive remediation/compensation;Visual/perceptual  remediation/compensation;Manual Therapy   Plan review and practice adaptive strategies for ADLS including donning/doffing jacket, cutting food, etc. Functional standing with large stepping (Following session: assess remaining STG's)    OT Home Exercise Plan 11/27/16: PWR! Seated, POP info, national websites. 11/29/16: PWR! Hands, tips for constipation. 12/05/16: PWR! Supine. 12/07/16: coordination HEP. 12/11/16: Writing strategies. 12/13/16: movement strategies for ADLS   Consulted and Agree with Plan of Care Patient;Family member/caregiver   Family Member Consulted Wife      Patient will benefit from skilled therapeutic intervention in order to improve the following deficits and impairments:  Decreased coordination, Impaired flexibility, Improper body mechanics, Improper spinal/pelvic alignment, Decreased safety awareness, Decreased endurance, Decreased knowledge of precautions, Impaired tone, Impaired UE functional use, Pain, Decreased knowledge of use of DME, Decreased cognition, Decreased mobility, Decreased strength, Impaired perceived functional ability, Impaired vision/preception  Visit Diagnosis: Other symptoms and signs involving the nervous system  Other symptoms and signs involving the musculoskeletal system  Abnormal posture  Other lack of coordination  Unsteadiness on feet    Problem List Patient Active Problem List   Diagnosis Date Noted  . Lumbar back pain with radiculopathy affecting right lower extremity 09/19/2016  . Elevated transaminase level 12/20/2015  . Chronic fatigue 12/02/2015  . Raynaud's phenomenon 04/16/2014  . Insomnia 03/31/2014  . Syncope 11/11/2012  . Autonomic postural hypotension 11/11/2012  . Parkinson's disease ([place]) 06/05/2012  . Restless leg syndrome 11/15/2011  . Primary hypothyroidism 09/11/2011  . Parkinsonism ([place]) 04/02/2011  . Depression, major, recurrent, in partial remission ([place]) 04/02/2011    Carey Bullocks,  OTR/L 12/13/2016, 12:26 PM  [facility] [address] [place], [place], [address] Phone: [phone]   Fax:  [phone]  Name: Tarek Cravens MRN: [id number] Date of Birth: [birth year]

## 2016-12-13 NOTE — Therapy (Signed)
[facility] [address] [place], [place], [address] Phone: [phone]   Fax:  [phone]  Speech Language Pathology Treatment  Patient Details  Name: [name] MRN: [id number] Date of Birth: 24-May-1945 Referring Provider: Dr. Wells Guiles Tat  Encounter Date: 12/13/2016      End of Session - 12/13/16 1432    Visit Number 2   Number of Visits 17   Date for SLP Re-Evaluation 01/19/17   Authorization Type may change re-eval date, as pt not scheudled to start ST for 2 weeks after eval   SLP Start Time 1105   SLP Stop Time  1146   SLP Time Calculation (min) 41 min   Activity Tolerance Patient tolerated treatment well      Past Medical History:  Diagnosis Date  . Allergy   . Anemia   . Depression   . Parkinson's disease ([place])   . Parkinson's disease ([place]) 06/05/2012  . Raynaud's syndrome   . Thyroid disease     Past Surgical History:  Procedure Laterality Date  . APPENDECTOMY    . FRACTURE SURGERY     jaw  . HERNIA REPAIR    . MANDIBLE FRACTURE SURGERY    . ROTATOR CUFF REPAIR    . TONSILLECTOMY    . VASECTOMY    . WRIST FRACTURE SURGERY      There were no vitals filed for this visit.      Subjective Assessment - 12/13/16 1109    Subjective "I think it was longer ago than that." (pt, with SLP "I tihnk you saw Mickel Baas a couple weeks ago."   Patient is accompained by: Family member  wife [name]               ADULT SLP TREATMENT - 12/13/16 1110      General Information   Behavior/Cognition Alert;Cooperative;Pleasant mood     Treatment Provided   Treatment provided Dysphagia     Dysphagia Treatment   Other treatment/comments SLP reviewed pt's modified with him and wife, as pt/wife stated pt was coughing with meals. SLP suspects pt might not following swallow precautions. SLP explained rationale for each precaution - pt not following double swallows, intermittent throat clear, or  alternate bites/sips. Reviewed dysphagia HEP with pt as well - pt required occasional min A for correct exercise procedure.      Assessment / Recommendations / Plan   Plan Continue with current plan of care     Progression Toward Goals   Progression toward goals Progressing toward goals          SLP Education - 12/13/16 1431    Education provided Yes   Education Details HEP (dysphgia), swallow precautions and rationale for them   Person(s) Educated Patient   Methods Explanation   Comprehension Verbalized understanding          SLP Short Term Goals - 12/13/16 1434      SLP SHORT TERM GOAL #1   Title Pt will average 90dB on loud "AH" with rare min A over 3 sessions.   Time 4   Period Weeks   Status On-going     SLP SHORT TERM GOAL #2   Title Pt will perform HEP for dysphagia and dysarthria with occasional min A over 3 sessions   Time 4   Period Weeks   Status On-going     SLP SHORT TERM GOAL #3   Title Pt will follow diet modificiations/swallow precautions with occasional min A over  2 sessions   Time 4   Period Weeks   Status On-going     SLP SHORT TERM GOAL #4   Title Pt will average 72dB on structured speech tasks 10/12 sentences with occasional min A   Time 4   Period Weeks   Status On-going     SLP SHORT TERM GOAL #5   Title Pt will utlize compensations for dysarthria/slur at sentence level 10/12 sentences with occasional min A   Status On-going          SLP Long Term Goals - 12/13/16 1435      SLP LONG TERM GOAL #1   Title Pt will average 92dB on loud "AH" over 3 sessions with rare min A   Time 8   Period Weeks   Status On-going     SLP LONG TERM GOAL #2   Title Pt will complete HEP for dysphagia and dysarthria with rare min A over 2 sessions   Time 8   Period Weeks   Status On-going     SLP LONG TERM GOAL #3   Title Spouse/pt will report carryover of swallow precautions/diet modifications outside of therapy with occasional min cues from  wife, over 3 sessions   Time 8   Period Weeks   Status On-going     SLP LONG TERM GOAL #4   Title Pt will average 70 dB over 15 minute conversation with rare min A over 3 sessions   Time 8   Period Weeks   Status On-going     SLP LONG TERM GOAL #5   Title Pt will be audible and intelligible in noisy environment during 15 minute conversation over 2 sessions.   Time 8   Period Weeks   Status On-going          Plan - 12/13/16 1432    Clinical Impression Statement Mr. Robar and his wife were educated today re: swallow precuations and the results of modified (MBSS) from January 2018. They were told if pt cont to have coughing when following prescribed swallow precautions it may be appropriate for a follow up objective study (MBSS or Fiberoptic swallow eval - FEES). Pt cont as appropriate for skilled ST to address swallwoing and speech deficits due to Parkinson's disease.   Speech Therapy Frequency 2x / week   Duration --  8 weeks   Treatment/Interventions SLP instruction and feedback;Internal/external aids;Compensatory strategies;Environmental controls;Oral motor exercises;Patient/family education;Pharyngeal strengthening exercises;Functional tasks;Language facilitation   Potential to Achieve Goals Good   Potential Considerations Cooperation/participation level   SLP Home Exercise Plan Loud "AH", dysphagia HEP; dysarthria HEP   Consulted and Agree with Plan of Care Patient;Family member/caregiver   Family Member Consulted spouse [name]      Patient will benefit from skilled therapeutic intervention in order to improve the following deficits and impairments:   Dysphagia, oropharyngeal phase  Dysarthria and anarthria    Problem List Patient Active Problem List   Diagnosis Date Noted  . Lumbar back pain with radiculopathy affecting right lower extremity 09/19/2016  . Elevated transaminase level 12/20/2015  . Chronic fatigue 12/02/2015  . Raynaud's phenomenon 04/16/2014  .  Insomnia 03/31/2014  . Syncope 11/11/2012  . Autonomic postural hypotension 11/11/2012  . Parkinson's disease ([place]) 06/05/2012  . Restless leg syndrome 11/15/2011  . Primary hypothyroidism 09/11/2011  . Parkinsonism ([place]) 04/02/2011  . Depression, major, recurrent, in partial remission ([place]) 04/02/2011    [place] ,[place], [place]  12/13/2016, 2:35 PM  [facility]  [address] [place], [place], [address] Phone: [phone]   Fax:  [phone]   Name: Misty Rago MRN: [id number] Date of Birth: [birth year]

## 2016-12-13 NOTE — Therapy (Signed)
[facility] [address] [place], [place], [address] Phone: [phone]   Fax:  [phone]  Physical Therapy Treatment  Patient Details  Name: [name] MRN: [id number] Date of Birth: 30-Apr-1945 Referring Provider: Alonza Bogus, DO  Encounter Date: 12/13/2016      PT End of Session - 12/13/16 1714    Visit Number 6   Number of Visits 17   Date for PT Re-Evaluation 01/19/17   Authorization Type Medicare, [employer]   PT Start Time 0933   PT Stop Time 1015   PT Time Calculation (min) 42 min   Activity Tolerance Patient tolerated treatment well   Behavior During Therapy [facility] for tasks assessed/performed      Past Medical History:  Diagnosis Date  . Allergy   . Anemia   . Depression   . Parkinson's disease ([place])   . Parkinson's disease ([place]) 06/05/2012  . Raynaud's syndrome   . Thyroid disease     Past Surgical History:  Procedure Laterality Date  . APPENDECTOMY    . FRACTURE SURGERY     jaw  . HERNIA REPAIR    . MANDIBLE FRACTURE SURGERY    . ROTATOR CUFF REPAIR    . TONSILLECTOMY    . VASECTOMY    . WRIST FRACTURE SURGERY      There were no vitals filed for this visit.      Subjective Assessment - 12/13/16 0937    Subjective No changes. has not had a chance to look up ordering brace (wife busy with moving father into ALF and mother at home)   Patient is accompained by: Family member   Pertinent History anemia, depression, Parkinson's disease dx 2009, R foot fracture ([facility] for foot fracture in March 2018); R RTC repair, scoliosis (not a surgical candidate)   Patient Stated Goals Pt's goals for therapy include exercises for back, help with more mobility in R hand; be able to stand up straighter and walk better (per wife)   Currently in Pain? Yes   Pain Score 2    Pain Location Hip   Pain Orientation Right   Pain Descriptors / Indicators Aching   Pain Type Chronic pain   Pain Onset More than a month ago   Pain Frequency Intermittent   Pain Relieving Factors tylenol; movement                         [facility] Adult PT Treatment/Exercise - 12/13/16 0001      Ambulation/Gait   Ambulation/Gait Assistance 5: Supervision;4: Min assist   Ambulation/Gait Assistance Details did not use foot-up brace (pt has not yet ordered one) and focused on heelstrike, step length, and UE swing. Utilized walking poles with PT assisting arm swing from behind. Pt with increased ?dystonia in RUE making it difficult to coordinate UE swing with LE steps (even with PT assisting via poles)   Ambulation Distance (Feet) 240 Feet  200, 240   Assistive device None   Gait Pattern Step-through pattern;Decreased arm swing - right;Decreased step length - right;Decreased dorsiflexion - right;Decreased trunk rotation;Trunk rotated posteriorly on right;Narrow base of support   Ambulation Surface Level   Pre-Gait Activities in // bars using targets for heelstrike/heel taps           [facility]) - 12/13/16 1711    PWR! exercises --  modified quadruped at counter   PWR! Up 20   PWR! Rock 20   PWR!  Twist 20   PWR! Step 20   Comments min cues with twist and step for technique               PT Short Term Goals - 11/21/16 0540      PT SHORT TERM GOAL #1   Title Pt will perform HEP with wife's supervision, to address Parkinson-specific deficits.  TARGET 12/22/16   Time 5   Period Weeks   Status New   Target Date 12/22/16     PT SHORT TERM GOAL #2   Title Pt will improve 5x sit<>stand to less than or equal to 15 seconds for improved efficiency and safety with transfers.   Time 5   Period Weeks   Status New   Target Date 12/22/16     PT SHORT TERM GOAL #3   Title Pt will improve TUG score to less than or equal to 13.5 seconds for decreased fall risk.   Time 5   Period Weeks   Status New   Target Date 12/22/16     PT SHORT TERM GOAL #4   Title Pt will improve  DGI score to at least 15/24 for decreased fall risk.   Time 5   Period Weeks   Status New   Target Date 12/22/16     PT SHORT TERM GOAL #5   Title Pt/wife will verbalize understanding of local Parkinson's disease resources.   Time 5   Period Weeks   Status New   Target Date 12/22/16           PT Long Term Goals - 11/21/16 0543      PT LONG TERM GOAL #1   Title Pt/wife will verbalize understanding of fall prevention in the home environment.  TARGET 01/19/17   Time 9   Period Weeks   Status New   Target Date 01/19/17     PT LONG TERM GOAL #2   Title Pt will perform sit<>stand from surfaces lower than 18", minimal UE support, at least 6 of 10 reps, modified independently, for improved car and low surface transfers.   Time 9   Period Weeks   Status New   Target Date 01/19/17     PT LONG TERM GOAL #3   Title Pt will improve TUG cognitive score to less tahn or equal to 15 seconds for decreased fall risk.   Time 9   Period Weeks   Status New   Target Date 01/19/17     PT LONG TERM GOAL #4   Title Pt will improve DGI score to at least 19/24 for decreased fall risk.   Time 9   Period Weeks   Status New   Target Date 01/19/17     PT LONG TERM GOAL #5   Title Pt will verbalize plans for continued community fitness upon d/c from PT.   Time 9   Period Weeks   Status New   Target Date 01/19/17               Plan - 12/13/16 1715    Clinical Impression Statement Continue to focus on proper technique for modified quadruped PWR! exercises for HEP. Patient required only min cues today. Also focused on gait training without foot-up brace as pt/wife have not had a chance to order the brace yet. Patient continues to show decr Rt foot dorsiflexion and foot clearance that improves with cues. Can continue to benefit from PT to work towards goals of improved gait and lesser fall  risk.    Rehab Potential Good   Clinical Impairments Affecting Rehab Potential scoliosis (not a  surgical candidate) with chronic back pain   PT Frequency 2x / week  1x/wk for 1 week, then 2x/wk for 8 weeks   PT Duration Other (comment)  total POC = 9 weeks   PT Treatment/Interventions ADLs/Self Care Home Management;Gait training;DME Instruction;Functional mobility training;Therapeutic activities;Therapeutic exercise;Balance training;Neuromuscular re-education;Patient/family education   PT Next Visit Plan check if pt has got his foot-up brace, If not, practice how to don our brace; continue to work on foot clearance and arm swing (?use walking poles outside to assist arm swing); ?initiate standing PWR vs review Modified quadruped PWR! MOves as part of HEP (required max cues 10/22),   Consulted and Agree with Plan of Care Patient;Family member/caregiver   Family Member Consulted wife      Patient will benefit from skilled therapeutic intervention in order to improve the following deficits and impairments:  Abnormal gait, Decreased balance, Decreased mobility, Decreased strength, Difficulty walking, Impaired tone, Impaired flexibility, Postural dysfunction, Pain (PT will monitor pain, but will not specifically address as a goal, as pain is longstanding due to scoliosis)  Visit Diagnosis: Other symptoms and signs involving the nervous system  Other symptoms and signs involving the musculoskeletal system  Abnormal posture  Other abnormalities of gait and mobility     Problem List Patient Active Problem List   Diagnosis Date Noted  . Lumbar back pain with radiculopathy affecting right lower extremity 09/19/2016  . Elevated transaminase level 12/20/2015  . Chronic fatigue 12/02/2015  . Raynaud's phenomenon 04/16/2014  . Insomnia 03/31/2014  . Syncope 11/11/2012  . Autonomic postural hypotension 11/11/2012  . Parkinson's disease ([place]) 06/05/2012  . Restless leg syndrome 11/15/2011  . Primary hypothyroidism 09/11/2011  . Parkinsonism ([place]) 04/02/2011  . Depression, major,  recurrent, in partial remission ([place]) 04/02/2011    Rexanne Mano, PT 12/13/2016, 5:23 PM  [facility] [address] [place], [place], [address] Phone: [phone]   Fax:  [phone]  Name: [name] MRN: [id number] Date of Birth: 1945-10-15

## 2016-12-13 NOTE — Patient Instructions (Addendum)

## 2016-12-18 ENCOUNTER — Ambulatory Visit: Payer: Medicare Other | Admitting: Occupational Therapy

## 2016-12-18 ENCOUNTER — Encounter: Payer: Self-pay | Admitting: Physical Therapy

## 2016-12-18 ENCOUNTER — Ambulatory Visit: Payer: Medicare Other | Admitting: Speech Pathology

## 2016-12-18 ENCOUNTER — Ambulatory Visit: Payer: Medicare Other | Admitting: Physical Therapy

## 2016-12-18 DIAGNOSIS — R293 Abnormal posture: Secondary | ICD-10-CM

## 2016-12-18 DIAGNOSIS — R29898 Other symptoms and signs involving the musculoskeletal system: Secondary | ICD-10-CM

## 2016-12-18 DIAGNOSIS — R278 Other lack of coordination: Secondary | ICD-10-CM

## 2016-12-18 DIAGNOSIS — R29818 Other symptoms and signs involving the nervous system: Secondary | ICD-10-CM

## 2016-12-18 DIAGNOSIS — R41842 Visuospatial deficit: Secondary | ICD-10-CM

## 2016-12-18 DIAGNOSIS — R471 Dysarthria and anarthria: Secondary | ICD-10-CM

## 2016-12-18 DIAGNOSIS — R2681 Unsteadiness on feet: Secondary | ICD-10-CM | POA: Diagnosis not present

## 2016-12-18 DIAGNOSIS — M6281 Muscle weakness (generalized): Secondary | ICD-10-CM | POA: Diagnosis not present

## 2016-12-18 DIAGNOSIS — R2689 Other abnormalities of gait and mobility: Secondary | ICD-10-CM

## 2016-12-18 DIAGNOSIS — R1312 Dysphagia, oropharyngeal phase: Secondary | ICD-10-CM

## 2016-12-18 DIAGNOSIS — R41840 Attention and concentration deficit: Secondary | ICD-10-CM

## 2016-12-18 NOTE — Therapy (Signed)
[facility] [address] [place], [place], [address] Phone: [phone]   Fax:  [phone]  Speech Language Pathology Treatment  Patient Details  Name: [name] MRN: [id number] Date of Birth: 06-20-1945 Referring Provider: Dr. Wells Guiles Tat  Encounter Date: 12/18/2016      End of Session - 12/18/16 1426    Visit Number 3   Number of Visits 17   Date for SLP Re-Evaluation 01/19/17   Authorization Type may change re-eval date, as pt not scheudled to start ST for 2 weeks after eval   SLP Start Time 1145   SLP Stop Time  1230   SLP Time Calculation (min) 45 min   Activity Tolerance Patient tolerated treatment well      Past Medical History:  Diagnosis Date  . Allergy   . Anemia   . Depression   . Parkinson's disease ([place])   . Parkinson's disease ([place]) 06/05/2012  . Raynaud's syndrome   . Thyroid disease     Past Surgical History:  Procedure Laterality Date  . APPENDECTOMY    . FRACTURE SURGERY     jaw  . HERNIA REPAIR    . MANDIBLE FRACTURE SURGERY    . ROTATOR CUFF REPAIR    . TONSILLECTOMY    . VASECTOMY    . WRIST FRACTURE SURGERY      There were no vitals filed for this visit.      Subjective Assessment - 12/18/16 1150    Subjective Pt, wife, report pt completing dysphagia HEP at home   Patient is accompained by: Family member   Special Tests spouse, [name]   Currently in Pain? No/denies               ADULT SLP TREATMENT - 12/18/16 1146      General Information   Behavior/Cognition Alert;Cooperative;Pleasant mood   Patient Positioning Upright in chair   Oral care provided N/A     Treatment Provided   Treatment provided Dysphagia;Cognitive-Linquistic     Dysphagia Treatment   Temperature Spikes Noted No   Respiratory Status Room air   Oral Cavity - Dentition Adequate natural dentition   Treatment Methods Therapeutic exercise;Compensation strategy  training;Patient/caregiver education   Patient observed directly with PO's No   Other treatment/comments SLP provided skilled feedback, rare min A as pt demo'd his HEP for dysphagia. SLP educated re: rationale for exercises based on pt's physical impairments as seen on MBS. Again reinforced swallowing precautions including double swallow, throat clear, alternating bites and sips. Pt's wife reports pt "coughing all the time," SLP educated re: possible aspiration of secretions. Encouraged pt to swallow intermittently, clear throat when "wet" voice is noted. Educated re: performing oral care regularly (after meals) to reduce oral bacteria load and decrease aspiration risk.     Pain Assessment   Pain Assessment No/denies pain     Cognitive-Linquistic Treatment   Treatment focused on Dysarthria   Skilled Treatment Pt demo'd loud /a/ with average in upper 90s dB initially; required SLP cues to shape /a/ for increased abdominal effort vs laryngeal push; averaged 91 dB with mod cues, demonstration. Sentence level tasks with occasional mod A for increased vocal effort, mouth opening, slowed rate. Occasional min A to swallow for secretion management.     Assessment / Recommendations / Plan   Plan Continue with current plan of care     Progression Toward Goals   Progression toward goals Progressing toward goals     General Information  Reason PO's not observed Other (comment)  targeted therapeutic exercise          SLP Education - 12/18/16 1425    Education provided Yes   Education Details brush teeth after meals to reduce oral bacterial load and decrease risk for aspiration of secretions   Person(s) Educated Patient;Spouse   Methods Explanation   Comprehension Verbalized understanding          SLP Short Term Goals - 12/18/16 1431      SLP SHORT TERM GOAL #1   Title Pt will average 90dB on loud "AH" with rare min A over 3 sessions.   Time 3   Period Weeks   Status On-going     SLP  SHORT TERM GOAL #2   Title Pt will perform HEP for dysphagia and dysarthria with occasional min A over 3 sessions   Time 3   Period Weeks   Status On-going     SLP SHORT TERM GOAL #3   Title Pt will follow diet modificiations/swallow precautions with occasional min A over 2 sessions   Time 3   Period Weeks   Status On-going     SLP SHORT TERM GOAL #4   Title Pt will average 72dB on structured speech tasks 10/12 sentences with occasional min A   Time 3   Period Weeks   Status On-going     SLP SHORT TERM GOAL #5   Title Pt will utlize compensations for dysarthria/slur at sentence level 10/12 sentences with occasional min A   Time 3   Period Weeks   Status On-going          SLP Long Term Goals - 12/18/16 1431      SLP LONG TERM GOAL #1   Title Pt will average 92dB on loud "AH" over 3 sessions with rare min A   Time 7   Period Weeks   Status On-going     SLP LONG TERM GOAL #2   Title Pt will complete HEP for dysphagia and dysarthria with rare min A over 2 sessions   Time 7   Period Weeks   Status On-going     SLP LONG TERM GOAL #3   Title Spouse/pt will report carryover of swallow precautions/diet modifications outside of therapy with occasional min cues from wife, over 3 sessions   Time 7   Period Weeks   Status On-going     SLP LONG TERM GOAL #4   Title Pt will average 70 dB over 15 minute conversation with rare min A over 3 sessions   Time 7   Period Weeks   Status On-going     SLP LONG TERM GOAL #5   Title Pt will be audible and intelligible in noisy environment during 15 minute conversation over 2 sessions.   Time 7   Period Weeks   Status On-going          Plan - 12/18/16 1426    Clinical Impression Statement Pt, wife report frequent coughing between meals. Educated re: thorough oral care due to aspiration of secretions seen on MBS and well as intermittent coughing post dry swallows. SLP reiterated swallowing precautions/exercises, which they report  pt followed at home this week. If pt cont to have coughing when following prescribed swallow precautions it may be appropriate for a follow up objective study (FEES may be beneficial for visualization of secretion management). Pt cont as appropriate for skilled ST to address swallwoing and speech deficits due to Parkinson's disease.  Speech Therapy Frequency 2x / week   Treatment/Interventions SLP instruction and feedback;Internal/external aids;Compensatory strategies;Environmental controls;Oral motor exercises;Patient/family education;Pharyngeal strengthening exercises;Functional tasks;Language facilitation   Potential to Achieve Goals Good   Potential Considerations Cooperation/participation level   SLP Home Exercise Plan Loud "AH", dysphagia HEP; dysarthria HEP   Consulted and Agree with Plan of Care Patient;Family member/caregiver   Family Member Consulted spouse [name]      Patient will benefit from skilled therapeutic intervention in order to improve the following deficits and impairments:   Dysphagia, oropharyngeal phase  Dysarthria and anarthria    Problem List Patient Active Problem List   Diagnosis Date Noted  . Lumbar back pain with radiculopathy affecting right lower extremity 09/19/2016  . Elevated transaminase level 12/20/2015  . Chronic fatigue 12/02/2015  . Raynaud's phenomenon 04/16/2014  . Insomnia 03/31/2014  . Syncope 11/11/2012  . Autonomic postural hypotension 11/11/2012  . Parkinson's disease ([place]) 06/05/2012  . Restless leg syndrome 11/15/2011  . Primary hypothyroidism 09/11/2011  . Parkinsonism ([place]) 04/02/2011  . Depression, major, recurrent, in partial remission ([place]) 04/02/2011   Deneise Lever, [place], [place] 12/18/2016, 2:32 PM  [facility] [address] [place], [place], [address] Phone: [phone]   Fax:  [phone]   Name: [name] MRN: [id number] Date of Birth: 05-05-1945

## 2016-12-18 NOTE — Therapy (Signed)
[facility] [address] [place], [place], [address] Phone: [phone]   Fax:  [phone]  Occupational Therapy Treatment  Patient Details  Name: [name] MRN: [id number] Date of Birth: 1945-09-22 Referring Provider: Dr. Carles Collet  Encounter Date: 12/18/2016      OT End of Session - 12/18/16 1703    Visit Number 8   Number of Visits 17   Date for OT Re-Evaluation 01/19/17   Authorization Type MCR Primary - G code needed, UHC secondary   Authorization - Visit Number 8   Authorization - Number of Visits 10   OT Start Time 1107   OT Stop Time 1145   OT Time Calculation (min) 38 min   Activity Tolerance Patient tolerated treatment well   Behavior During Therapy [facility] for tasks assessed/performed      Past Medical History:  Diagnosis Date  . Allergy   . Anemia   . Depression   . Parkinson's disease ([place])   . Parkinson's disease ([place]) 06/05/2012  . Raynaud's syndrome   . Thyroid disease     Past Surgical History:  Procedure Laterality Date  . APPENDECTOMY    . FRACTURE SURGERY     jaw  . HERNIA REPAIR    . MANDIBLE FRACTURE SURGERY    . ROTATOR CUFF REPAIR    . TONSILLECTOMY    . VASECTOMY    . WRIST FRACTURE SURGERY      There were no vitals filed for this visit.      Subjective Assessment - 12/18/16 1111    Subjective  Pt reports difficulty with pulling up pants and starting leafblower due to freezing.   Patient is accompained by: Family member   Pertinent History PD since 2009, spinal stenosis, scoliosis, HOH, Raynauds, h/o falls with radius fx in 2011?, Rt ankle fx 2017, Rt rotator cuff surgery 2003   Limitations fall risk, limited driving   Patient Stated Goals put on my watch   Currently in Pain? No/denies   Pain Onset More than a month ago      Self Care:     Continued instruction in importance and in use of large amplitude movements to prevent future complications related to PD.   Pt /wife verbalized understanding.  Pt/wife educated in possible visual-perceptual and cognitive changes that may be associated with PD.  Also discussed this in relation to driving and recommended pt ensure that he only drives when medication is working well and that wife intermittently ride with pt to check on how he is performing.  Pt/wife verbalized understanding.  Practiced simulated vacuuming with min cueing to keep feet apart with one in front of the other to encourage wt. Shift and associated trunk movements with pushing of vacuuming.  Eating:  Instructed pt in strategies for eating including holding utensil in the middle vs. The end, positioning of fork for stabbing, strategy of cutting with large movements, scooting chair close to table, use of PWR! Hands, use of big intentional, and good posture.  Pt verbalized understanding.  Opening/closing bottles with use of large amplitude movement strategies with min cueing.   Dressing:   Practiced donning/doffing jacket using large amplitude movement strategies after initial instruction.  Pt demo improvement with repetition and use/min cues for large amplitude movements. (incr trunk rotation)  Practiced donning/doffing socks with large amplitude movements after instruction.  Pt able to demo donning shoe and tying.  Functional mobility:  In standing, functional step and reach forward/back to place cylinder  objects on overhead shelf using PWR! Hands/reach with min cueing for large amplitude step/hands.  Set up for large amplitude movements, to encourage trunk rotation and wt. Shift.                          OT Education - 12/18/16 1703    Education Details Reviewed use of large amplitude movement strategies for ADLs   Person(s) Educated Patient;Spouse   Methods Explanation;Demonstration;Verbal cues   Comprehension Verbalized understanding;Returned demonstration;Verbal cues required          OT Short Term Goals -  12/11/16 1221      OT SHORT TERM GOAL #1   Title Pt/family will be independent with PD specific HEP including PWR! moves and coordination HEP    Time 4   Period Weeks   Status Achieved     OT SHORT TERM GOAL #2   Title Pt will write at least 3 sentences with no significant decr in size and 95% legibility or greater    Time 4   Period Weeks   Status Achieved     OT SHORT TERM GOAL #3   Title Pt will perform PPT #2 test in 12 sec. or under   Baseline eval = 15.78 sec   Time 4   Period Weeks   Status On-going     OT SHORT TERM GOAL #4   Title Pt will perform PPT #4 test in 22 sec. or under   Baseline eval = 25.29 sec   Time 4   Period Weeks   Status On-going     OT SHORT TERM GOAL #5   Title Pt will increase coordination Rt hand as evidenced by performing 9 hole peg test in 32 sec. or less   Baseline eval = 36.90 sec   Time 4   Period Weeks   Status On-going     OT SHORT TERM GOAL #6   Title Pt will increase RUE function as evidenced by performing 34 blocks or greater on Box & Blocks test   Baseline eval = 30 (Lt = 48)    Time 4   Period Weeks   Status New           OT Long Term Goals - 11/20/16 1501      OT LONG TERM GOAL #1   Title Pt will verbalize understanding of ways to prevent future PD related complications and appropriate community and national resources prn   Time 8   Period Weeks   Status New   Target Date 01/19/17     OT LONG TERM GOAL #2   Title Pt will verbalize understanding of adaptive strategies to incr ease with ADLS/IADLS   Time 8   Period Weeks   Status New     OT LONG TERM GOAL #3   Title Pt will perform PPT #4 in 19 sec. or under   Baseline eval = 25.29 sec   Time 8   Period Weeks   Status New     OT LONG TERM GOAL #4   Title Pt will improve functional coordination as evidenced by reducing time on 3 button test to 40 sec. or less   Baseline 59.01 sec. (standing with shirt on)    Time 8   Period Weeks   Status New     OT  LONG TERM GOAL #5   Title Pt will verbalize understanding of ways to keep thinking skills sharp   Time 8  Period Weeks   Status New     Long Term Additional Goals   Additional Long Term Goals Yes     OT LONG TERM GOAL #6   Title Pt to report using RUE as dominant UE for ADLS/IADLS 36% of the time   Time 8   Period Weeks   Status New               Plan - 12/18/16 1703    Clinical Impression Statement Pt returned demo use of large amplitude movement strategies after review and initial cueing.   Rehab Potential Good   OT Frequency 2x / week   OT Duration 8 weeks   OT Treatment/Interventions Self-care/ADL training;DME and/or AE instruction;Patient/family education;Splinting;Therapeutic exercises;Therapeutic activities;Neuromuscular education;Functional Mobility Training;Passive range of motion;Cognitive remediation/compensation;Visual/perceptual remediation/compensation;Manual Therapy   Plan assess remaining STGs   OT Home Exercise Plan 11/27/16: PWR! Seated, POP info, national websites. 11/29/16: PWR! Hands, tips for constipation. 12/05/16: PWR! Supine. 12/07/16: coordination HEP. 12/11/16: Writing strategies. 12/13/16: movement strategies for ADLS   Consulted and Agree with Plan of Care Patient;Family member/caregiver   Family Member Consulted Wife      Patient will benefit from skilled therapeutic intervention in order to improve the following deficits and impairments:  Decreased coordination, Impaired flexibility, Improper body mechanics, Improper spinal/pelvic alignment, Decreased safety awareness, Decreased endurance, Decreased knowledge of precautions, Impaired tone, Impaired UE functional use, Pain, Decreased knowledge of use of DME, Decreased cognition, Decreased mobility, Decreased strength, Impaired perceived functional ability, Impaired vision/preception  Visit Diagnosis: Other symptoms and signs involving the nervous system  Other symptoms and signs involving the  musculoskeletal system  Abnormal posture  Other lack of coordination  Unsteadiness on feet  Other abnormalities of gait and mobility  Attention and concentration deficit  Visuospatial deficit    Problem List Patient Active Problem List   Diagnosis Date Noted  . Lumbar back pain with radiculopathy affecting right lower extremity 09/19/2016  . Elevated transaminase level 12/20/2015  . Chronic fatigue 12/02/2015  . Raynaud's phenomenon 04/16/2014  . Insomnia 03/31/2014  . Syncope 11/11/2012  . Autonomic postural hypotension 11/11/2012  . Parkinson's disease ([place]) 06/05/2012  . Restless leg syndrome 11/15/2011  . Primary hypothyroidism 09/11/2011  . Parkinsonism ([place]) 04/02/2011  . Depression, major, recurrent, in partial remission ([place]) 04/02/2011    [facility] 12/18/2016, 5:06 PM  [facility] [address] [place], [place], [address] Phone: [phone]   Fax:  [phone]  Name: [name] MRN: [id number] Date of Birth: May 04, 1945   Vianne Bulls, OTR/L [facility] [address] [place], [place]  [address] [phone] phone [phone] 12/18/16 5:06 PM

## 2016-12-18 NOTE — Therapy (Signed)
[facility] [address] [place], [place], [address] Phone: [phone]   Fax:  [phone]  Physical Therapy Treatment  Patient Details  Name: [name] MRN: [id number] Date of Birth: 09-19-1945 Referring Provider: Alonza Bogus, DO  Encounter Date: 12/18/2016      PT End of Session - 12/18/16 1217    Visit Number 7   Number of Visits 17   Date for PT Re-Evaluation 01/19/17   Authorization Type Medicare, UHC   PT Start Time 1031   PT Stop Time 1100   PT Time Calculation (min) 29 min   Activity Tolerance Patient tolerated treatment well   Behavior During Therapy [facility] for tasks assessed/performed      Past Medical History:  Diagnosis Date  . Allergy   . Anemia   . Depression   . Parkinson's disease ([place])   . Parkinson's disease ([place]) 06/05/2012  . Raynaud's syndrome   . Thyroid disease     Past Surgical History:  Procedure Laterality Date  . APPENDECTOMY    . FRACTURE SURGERY     jaw  . HERNIA REPAIR    . MANDIBLE FRACTURE SURGERY    . ROTATOR CUFF REPAIR    . TONSILLECTOMY    . VASECTOMY    . WRIST FRACTURE SURGERY      There were no vitals filed for this visit.      Subjective Assessment - 12/18/16 1208    Subjective no changes. Got very frustrated this weekend trying to order the foot-up brace online (and still hasn't been able to complete order).    Patient is accompained by: Family member   Pertinent History anemia, depression, Parkinson's disease dx 2009, R foot fracture [facility] PT for foot fracture in March 2018); R RTC repair, scoliosis (not a surgical candidate)   Patient Stated Goals Pt's goals for therapy include exercises for back, help with more mobility in R hand; be able to stand up straighter and walk better (per wife)   Currently in Pain? No/denies   Pain Onset More than a month ago                         [facility] Adult PT  Treatment/Exercise - 12/18/16 0001      Ambulation/Gait   Ambulation/Gait Assistance 5: Supervision;4: Min assist   Ambulation/Gait Assistance Details used walking poles to facilitate RUE swing (pt holding othre end of poles); pt progressed to PT only having to facilitate getting back in sequence x 3 over 500 ft; attempted pt using walking sticks as intended and he quickly became frustrated that he could not coordinate. He agreed to slow down and reattempt. With very slow velocity, he would still get UEs out of sync with legs. Attempted therapist facilitating RUE, however this also frustrated pt.    Ambulation Distance (Feet) --  >4000 ft (no rest breaks but would stop to educate   Assistive device Other (Comment)  walking sticks   Gait Pattern Step-through pattern;Decreased arm swing - right;Decreased step length - right;Decreased dorsiflexion - right;Decreased trunk rotation;Trunk rotated posteriorly on right;Narrow base of support;Right steppage   Ambulation Surface Level;Outdoor;Paved   Curb 5: Supervision  x 3 in parking lot   Pre-Gait Activities attempting to cue/facilitate UE swing with exaggerated, slow stepping     Posture/Postural Control   Posture/Postural Control Postural limitations   Postural Limitations Rounded Shoulders;Forward head  PT Short Term Goals - 11/21/16 0540      PT SHORT TERM GOAL #1   Title Pt will perform HEP with wife's supervision, to address Parkinson-specific deficits.  TARGET 12/22/16   Time 5   Period Weeks   Status New   Target Date 12/22/16     PT SHORT TERM GOAL #2   Title Pt will improve 5x sit<>stand to less than or equal to 15 seconds for improved efficiency and safety with transfers.   Time 5   Period Weeks   Status New   Target Date 12/22/16     PT SHORT TERM GOAL #3   Title Pt will improve TUG score to less than or equal to 13.5 seconds for decreased fall risk.   Time 5   Period Weeks   Status New   Target  Date 12/22/16     PT SHORT TERM GOAL #4   Title Pt will improve DGI score to at least 15/24 for decreased fall risk.   Time 5   Period Weeks   Status New   Target Date 12/22/16     PT SHORT TERM GOAL #5   Title Pt/wife will verbalize understanding of local Parkinson's disease resources.   Time 5   Period Weeks   Status New   Target Date 12/22/16           PT Long Term Goals - 11/21/16 0543      PT LONG TERM GOAL #1   Title Pt/wife will verbalize understanding of fall prevention in the home environment.  TARGET 01/19/17   Time 9   Period Weeks   Status New   Target Date 01/19/17     PT LONG TERM GOAL #2   Title Pt will perform sit<>stand from surfaces lower than 18", minimal UE support, at least 6 of 10 reps, modified independently, for improved car and low surface transfers.   Time 9   Period Weeks   Status New   Target Date 01/19/17     PT LONG TERM GOAL #3   Title Pt will improve TUG cognitive score to less tahn or equal to 15 seconds for decreased fall risk.   Time 9   Period Weeks   Status New   Target Date 01/19/17     PT LONG TERM GOAL #4   Title Pt will improve DGI score to at least 19/24 for decreased fall risk.   Time 9   Period Weeks   Status New   Target Date 01/19/17     PT LONG TERM GOAL #5   Title Pt will verbalize plans for continued community fitness upon d/c from PT.   Time 9   Period Weeks   Status New   Target Date 01/19/17               Plan - 12/18/16 1217    Clinical Impression Statement Shortened session due to PT's schedule. Focused on gait training for upright posture, step length, and RUE swing. Patient progressed to using walking sticks with PT holding other ends but not having to facilitate his RUE. On return to inside, pt walked 30 ft with correct RUE swing without thinking about it. Patient reported his walking felt really good today when he could coordinate his UEs--moving more smoothly. Patient can continue to benefit  from PT   Rehab Potential Good   Clinical Impairments Affecting Rehab Potential scoliosis (not a surgical candidate) with chronic back pain   PT Frequency 2x /  week  1x/wk for 1 week, then 2x/wk for 8 weeks   PT Duration Other (comment)  total POC = 9 weeks   PT Treatment/Interventions ADLs/Self Care Home Management;Gait training;DME Instruction;Functional mobility training;Therapeutic activities;Therapeutic exercise;Balance training;Neuromuscular re-education;Patient/family education   PT Next Visit Plan check STGs; check if pt has got his foot-up brace, If not, practice how to don our brace; continue to work on foot clearance and arm swing (did well using walking poles outside to assist arm swing); ?initiate standing PWR vs review Modified quadruped PWR! MOves as part of HEP (required max cues 10/22),   Consulted and Agree with Plan of Care Patient;Family member/caregiver   Family Member Consulted wife      Patient will benefit from skilled therapeutic intervention in order to improve the following deficits and impairments:  Abnormal gait, Decreased balance, Decreased mobility, Decreased strength, Difficulty walking, Impaired tone, Impaired flexibility, Postural dysfunction, Pain (PT will monitor pain, but will not specifically address as a goal, as pain is longstanding due to scoliosis)  Visit Diagnosis: Other symptoms and signs involving the nervous system  Abnormal posture  Other abnormalities of gait and mobility     Problem List Patient Active Problem List   Diagnosis Date Noted  . Lumbar back pain with radiculopathy affecting right lower extremity 09/19/2016  . Elevated transaminase level 12/20/2015  . Chronic fatigue 12/02/2015  . Raynaud's phenomenon 04/16/2014  . Insomnia 03/31/2014  . Syncope 11/11/2012  . Autonomic postural hypotension 11/11/2012  . Parkinson's disease ([place]) 06/05/2012  . Restless leg syndrome 11/15/2011  . Primary hypothyroidism 09/11/2011  .  Parkinsonism ([place]) 04/02/2011  . Depression, major, recurrent, in partial remission ([place]) 04/02/2011    Rexanne Mano, PT 12/18/2016, 12:22 PM  [facility] [address] [place], [place], [address] Phone: [phone]   Fax:  [phone]  Name: Chancelor Hardrick MRN: [id number] Date of Birth: Jul 19, 1945

## 2016-12-20 ENCOUNTER — Encounter: Payer: Self-pay | Admitting: Physical Therapy

## 2016-12-20 ENCOUNTER — Ambulatory Visit: Payer: Medicare Other

## 2016-12-20 ENCOUNTER — Ambulatory Visit: Payer: Medicare Other | Admitting: Occupational Therapy

## 2016-12-20 ENCOUNTER — Ambulatory Visit: Payer: Medicare Other | Admitting: Physical Therapy

## 2016-12-20 DIAGNOSIS — R471 Dysarthria and anarthria: Secondary | ICD-10-CM

## 2016-12-20 DIAGNOSIS — R2689 Other abnormalities of gait and mobility: Secondary | ICD-10-CM | POA: Diagnosis not present

## 2016-12-20 DIAGNOSIS — R29818 Other symptoms and signs involving the nervous system: Secondary | ICD-10-CM | POA: Diagnosis not present

## 2016-12-20 DIAGNOSIS — M6281 Muscle weakness (generalized): Secondary | ICD-10-CM | POA: Diagnosis not present

## 2016-12-20 DIAGNOSIS — R29898 Other symptoms and signs involving the musculoskeletal system: Secondary | ICD-10-CM

## 2016-12-20 DIAGNOSIS — R2681 Unsteadiness on feet: Secondary | ICD-10-CM | POA: Diagnosis not present

## 2016-12-20 DIAGNOSIS — R1312 Dysphagia, oropharyngeal phase: Secondary | ICD-10-CM

## 2016-12-20 DIAGNOSIS — R293 Abnormal posture: Secondary | ICD-10-CM

## 2016-12-20 DIAGNOSIS — R278 Other lack of coordination: Secondary | ICD-10-CM

## 2016-12-20 NOTE — Therapy (Signed)
[facility] [address] [place], [place], [address] Phone: [phone]   Fax:  [phone]  Speech Language Pathology Treatment  Patient Details  Name: [name] MRN: [id number] Date of Birth: 10-May-1945 Referring Provider: Dr. Wells Guiles Tat  Encounter Date: 12/20/2016      End of Session - 12/20/16 0847    Visit Number 4   Number of Visits 17   Date for SLP Re-Evaluation 01/19/17   SLP Start Time 0805   SLP Stop Time  0847   SLP Time Calculation (min) 42 min   Activity Tolerance Patient tolerated treatment well      Past Medical History:  Diagnosis Date  . Allergy   . Anemia   . Depression   . Parkinson's disease ([place])   . Parkinson's disease ([place]) 06/05/2012  . Raynaud's syndrome   . Thyroid disease     Past Surgical History:  Procedure Laterality Date  . APPENDECTOMY    . FRACTURE SURGERY     jaw  . HERNIA REPAIR    . MANDIBLE FRACTURE SURGERY    . ROTATOR CUFF REPAIR    . TONSILLECTOMY    . VASECTOMY    . WRIST FRACTURE SURGERY      There were no vitals filed for this visit.      Subjective Assessment - 12/20/16 0813    Subjective Pt/wife both report pt is doing much better with following swallow precautions.   Patient is accompained by: Family member  wife   Currently in Pain? No/denies               ADULT SLP TREATMENT - 12/20/16 0962      General Information   Behavior/Cognition Alert;Cooperative;Pleasant mood     Treatment Provided   Treatment provided Dysphagia;Cognitive-Linquistic     Dysphagia Treatment   Temperature Spikes Noted No   Respiratory Status Room air   Oral Cavity - Dentition Adequate natural dentition   Treatment Methods Therapeutic exercise;Compensation strategy training;Patient/caregiver education   Patient observed directly with PO's No   Oral Phase Signs & Symptoms --  none noted   Pharyngeal Phase Signs & Symptoms Delayed throat  clear;Complaints of residue  x1, at end of snack; residue with cereal bar   Other treatment/comments SLP did not have to cue pt at all for swallow precautions with cereal bar and water. Cues for swallowing twice with water sips after pt's snack were necessary.      Cognitive-Linquistic Treatment   Treatment focused on Dysarthria   Skilled Treatment Pt completed loud /a/ with average in low-mid 90s dB in order to recalibrate conversational volume to WNL.  Sentence level tasks with independence and volume average WNL, however incr'd cognitive load necessitated pt spend extra time on sentence formulation.      Assessment / Recommendations / Plan   Plan Continue with current plan of care     Progression Toward Goals   Progression toward goals Progressing toward goals          SLP Education - 12/20/16 0846    Education provided Yes   Education Details swallow precuations, cough or throat clear/reswallow during the day   Person(s) Educated Patient;Spouse   Methods Explanation;Demonstration;Verbal cues   Comprehension Verbalized understanding;Returned demonstration          SLP Short Term Goals - 12/20/16 1440      SLP SHORT TERM GOAL #1   Title Pt will average 90dB on loud "AH" with rare min  A over 3 sessions.   Time 3   Period Weeks   Status On-going     SLP SHORT TERM GOAL #2   Title Pt will perform HEP for dysphagia and dysarthria with occasional min A over 3 sessions   Time 3   Period Weeks   Status On-going     SLP SHORT TERM GOAL #3   Title Pt will follow diet modificiations/swallow precautions with occasional min A over 2 sessions   Time 3   Period Weeks   Status On-going     SLP SHORT TERM GOAL #4   Title Pt will average 72dB on structured speech tasks 10/12 sentences with occasional min A   Time 3   Period Weeks   Status On-going     SLP SHORT TERM GOAL #5   Title Pt will utlize compensations for dysarthria/slur at sentence level 10/12 sentences with  occasional min A   Time 3   Period Weeks   Status On-going          SLP Long Term Goals - 12/20/16 1440      SLP LONG TERM GOAL #1   Title Pt will average 92dB on loud "AH" over 3 sessions with rare min A   Time 7   Period Weeks   Status On-going     SLP LONG TERM GOAL #2   Title Pt will complete HEP for dysphagia and dysarthria with rare min A over 2 sessions   Time 7   Period Weeks   Status On-going     SLP LONG TERM GOAL #3   Title Spouse/pt will report carryover of swallow precautions/diet modifications outside of therapy with occasional min cues from wife, over 3 sessions   Time 7   Period Weeks   Status On-going     SLP LONG TERM GOAL #4   Title Pt will average 70 dB over 15 minute conversation with rare min A over 3 sessions   Time 7   Period Weeks   Status On-going     SLP LONG TERM GOAL #5   Title Pt will be audible and intelligible in noisy environment during 15 minute conversation over 2 sessions.   Time 7   Period Weeks   Status On-going          Plan - 12/20/16 0848    Clinical Impression Statement SLP re-educated re: swallow precautions with POs. SLP reiterated swallowing precautions/exercises, which they report pt followed at home this week. WNL loudness was also targeted in ST today. Pt cont as appropriate for skilled ST to address swallwoing and speech deficits due to Parkinson's disease.   Speech Therapy Frequency 2x / week   Duration --  8 weeks   Treatment/Interventions SLP instruction and feedback;Internal/external aids;Compensatory strategies;Environmental controls;Oral motor exercises;Patient/family education;Pharyngeal strengthening exercises;Functional tasks;Language facilitation   Potential to Achieve Goals Good      Patient will benefit from skilled therapeutic intervention in order to improve the following deficits and impairments:   Dysphagia, oropharyngeal phase  Dysarthria and anarthria    Problem List Patient Active  Problem List   Diagnosis Date Noted  . Lumbar back pain with radiculopathy affecting right lower extremity 09/19/2016  . Elevated transaminase level 12/20/2015  . Chronic fatigue 12/02/2015  . Raynaud's phenomenon 04/16/2014  . Insomnia 03/31/2014  . Syncope 11/11/2012  . Autonomic postural hypotension 11/11/2012  . Parkinson's disease ([place]) 06/05/2012  . Restless leg syndrome 11/15/2011  . Primary hypothyroidism 09/11/2011  . Parkinsonism ([place]) 04/02/2011  .  Depression, major, recurrent, in partial remission ([place]) 04/02/2011    [facility] ,[place], [place]  12/20/2016, 2:40 PM  [facility] [address] [place], [place], [address] Phone: [phone]   Fax:  [phone]   Name: [name] MRN: [id number] Date of Birth: Oct 25, 1945

## 2016-12-20 NOTE — Therapy (Signed)
[facility] [address] [place], [place], [address] Phone: [phone]   Fax:  [phone]  Occupational Therapy Treatment  Patient Details  Name: [name] MRN: [id number] Date of Birth: 05/09/1945 Referring Provider: Dr. Tat  Encounter Date: 12/20/2016      OT End of Session - 12/20/16 1118    Visit Number 9   Number of Visits 17   Date for OT Re-Evaluation 01/19/17   Authorization Type MCR Primary - G code needed, UHC secondary   Authorization - Visit Number 9   Authorization - Number of Visits 10   OT Start Time 1015   OT Stop Time 1100   OT Time Calculation (min) 45 min   Activity Tolerance Patient tolerated treatment well      Past Medical History:  Diagnosis Date  . Allergy   . Anemia   . Depression   . Parkinson's disease (HCC)   . Parkinson's disease (HCC) 06/05/2012  . Raynaud's syndrome   . Thyroid disease     Past Surgical History:  Procedure Laterality Date  . APPENDECTOMY    . FRACTURE SURGERY     jaw  . HERNIA REPAIR    . MANDIBLE FRACTURE SURGERY    . ROTATOR CUFF REPAIR    . TONSILLECTOMY    . VASECTOMY    . WRIST FRACTURE SURGERY      There were no vitals filed for this visit.      Subjective Assessment - 12/20/16 1027    Subjective  Wife reports pt took his meds at 9:30   Patient is accompained by: Family member   Pertinent History PD since 2009, spinal stenosis, scoliosis, HOH, Raynauds, h/o falls with radius fx in 2011?, Rt ankle fx 2017, Rt rotator cuff surgery 2003   Limitations fall risk, limited driving   Patient Stated Goals put on my watch   Currently in Pain? No/denies  in UE's, only Rt hip            [facility] OT Assessment - 12/20/16 0001      Observation/Other Assessments   Simulated Eating Time (seconds) 8.59 sec.    Donning Doffing Jacket Time (seconds) 16 sec.      Coordination   Right 9 Hole Peg Test 18.25 sec   Box and Blocks Rt = 49                  OT Treatments/Exercises (OP) - 12/20/16 0001      ADLs   UB Dressing Practiced donning/doffing jacket x 4   ADL Comments Assessed remaining STG's and reviewed. Discussed pt's significant progress in objective measures as well as function (see STG section and assessment for details). Also reviewed/discussed recommendations with driving, large amplitude movements with ADLS specifically donning/doffing jacket, cutting food, donning seatbelt, etc. Discussed PWR! Class upon d/c from therapy as options for continuing ex     Exercises   Exercises --  UBE x 7 min. Level 3, maintaining RPM > 45                  OT Short Term Goals - 12/20/16 1118      OT SHORT TERM GOAL #1   Title Pt/family will be independent with PD specific HEP including PWR! moves and coordination HEP    Time 4   Period Weeks   Status Achieved     OT SHORT TERM GOAL #2   Title Pt will write at least 3 sentences   with no significant decr in size and 95% legibility or greater    Time 4   Period Weeks   Status Achieved     OT SHORT TERM GOAL #3   Title Pt will perform PPT #2 test in 12 sec. or under   Baseline eval = 15.78 sec   Time 4   Period Weeks   Status Achieved  12/20/16 = 8.59 sec     OT SHORT TERM GOAL #4   Title Pt will perform PPT #4 test in 22 sec. or under   Baseline eval = 25.29 sec   Time 4   Period Weeks   Status Achieved  12/20/16 = 16 sec     OT SHORT TERM GOAL #5   Title Pt will increase coordination Rt hand as evidenced by performing 9 hole peg test in 32 sec. or less   Baseline eval = 36.90 sec   Time 4   Period Weeks   Status New  Rt = 18.25 sec     OT SHORT TERM GOAL #6   Title Pt will increase RUE function as evidenced by performing 34 blocks or greater on Box & Blocks test   Baseline eval = 30 (Lt = 48)    Time 4   Period Weeks   Status Achieved  Rt = 49 sec           OT Long Term Goals - 11/20/16 1501      OT LONG TERM GOAL #1    Title Pt will verbalize understanding of ways to prevent future PD related complications and appropriate community and national resources prn   Time 8   Period Weeks   Status New   Target Date 01/19/17     OT LONG TERM GOAL #2   Title Pt will verbalize understanding of adaptive strategies to incr ease with ADLS/IADLS   Time 8   Period Weeks   Status New     OT LONG TERM GOAL #3   Title Pt will perform PPT #4 in 19 sec. or under   Baseline eval = 25.29 sec   Time 8   Period Weeks   Status New     OT LONG TERM GOAL #4   Title Pt will improve functional coordination as evidenced by reducing time on 3 button test to 40 sec. or less   Baseline 59.01 sec. (standing with shirt on)    Time 8   Period Weeks   Status New     OT LONG TERM GOAL #5   Title Pt will verbalize understanding of ways to keep thinking skills sharp   Time 8   Period Weeks   Status New     Long Term Additional Goals   Additional Long Term Goals Yes     OT LONG TERM GOAL #6   Title Pt to report using RUE as dominant UE for ADLS/IADLS 75% of the time   Time 8   Period Weeks   Status New               Plan - 12/20/16 1120    Clinical Impression Statement Pt has met all STG's and has made significant progress with all goals, RUE coordination and function.    Rehab Potential Good   OT Frequency 2x / week   OT Duration 8 weeks   OT Treatment/Interventions Self-care/ADL training;DME and/or AE instruction;Patient/family education;Splinting;Therapeutic exercises;Therapeutic activities;Neuromuscular education;Functional Mobility Training;Passive range of motion;Cognitive remediation/compensation;Visual/perceptual remediation/compensation;Manual Therapy   Plan G-code/PN -   can use objective measurements from today, Standing with functional reach/coordination, UBE   OT Home Exercise Plan 11/27/16: PWR! Seated, POP info, national websites. 11/29/16: PWR! Hands, tips for constipation. 12/05/16: PWR! Supine.  12/07/16: coordination HEP. 12/11/16: Writing strategies. 12/13/16: movement strategies for ADLS   Consulted and Agree with Plan of Care Patient;Family member/caregiver   Family Member Consulted Wife      Patient will benefit from skilled therapeutic intervention in order to improve the following deficits and impairments:  Decreased coordination, Impaired flexibility, Improper body mechanics, Improper spinal/pelvic alignment, Decreased safety awareness, Decreased endurance, Decreased knowledge of precautions, Impaired tone, Impaired UE functional use, Pain, Decreased knowledge of use of DME, Decreased cognition, Decreased mobility, Decreased strength, Impaired perceived functional ability, Impaired vision/preception  Visit Diagnosis: Other symptoms and signs involving the nervous system  Other symptoms and signs involving the musculoskeletal system  Abnormal posture  Other lack of coordination    Problem List Patient Active Problem List   Diagnosis Date Noted  . Lumbar back pain with radiculopathy affecting right lower extremity 09/19/2016  . Elevated transaminase level 12/20/2015  . Chronic fatigue 12/02/2015  . Raynaud's phenomenon 04/16/2014  . Insomnia 03/31/2014  . Syncope 11/11/2012  . Autonomic postural hypotension 11/11/2012  . Parkinson's disease ([place]) 06/05/2012  . Restless leg syndrome 11/15/2011  . Primary hypothyroidism 09/11/2011  . Parkinsonism ([place]) 04/02/2011  . Depression, major, recurrent, in partial remission ([place]) 04/02/2011    Carey Bullocks, OTR/L 12/20/2016, [facility] [address] [place], [place], [address] Phone: [phone]   Fax:  [phone]  Name: [name] MRN: [id number] Date of Birth: 1945-10-11

## 2016-12-20 NOTE — Therapy (Signed)
[facility] [address] [place], [place], [address] Phone: [phone]   Fax:  [phone]  Physical Therapy Treatment  Patient Details  Name: [name] MRN: [id number] Date of Birth: September 02, 1945 Referring Provider: Alonza Bogus, DO  Encounter Date: 12/20/2016      PT End of Session - 12/20/16 1301    Visit Number 8   Number of Visits 17   Date for PT Re-Evaluation 01/19/17   Authorization Type Medicare, UHC   PT Start Time [phone]   PT Stop Time 1017   PT Time Calculation (min) 43 min   Activity Tolerance Patient tolerated treatment well   Behavior During Therapy [facility] for tasks assessed/performed      Past Medical History:  Diagnosis Date  . Allergy   . Anemia   . Depression   . Parkinson's disease ([place])   . Parkinson's disease ([place]) 06/05/2012  . Raynaud's syndrome   . Thyroid disease     Past Surgical History:  Procedure Laterality Date  . APPENDECTOMY    . FRACTURE SURGERY     jaw  . HERNIA REPAIR    . MANDIBLE FRACTURE SURGERY    . ROTATOR CUFF REPAIR    . TONSILLECTOMY    . VASECTOMY    . WRIST FRACTURE SURGERY      There were no vitals filed for this visit.      Subjective Assessment - 12/20/16 0933    Subjective No instances of catching his right foot to the point he feels like he was tripping or near-fall. Rt foot does drag though.   Patient is accompained by: Family member   Pertinent History anemia, depression, Parkinson's disease dx 2009, R foot fracture [facility] for foot fracture in March 2018); R RTC repair, scoliosis (not a surgical candidate)   Patient Stated Goals Pt's goals for therapy include exercises for back, help with more mobility in R hand; be able to stand up straighter and walk better (per wife)   Currently in Pain? Yes   Pain Score 3    Pain Location Hip   Pain Orientation Right   Pain Descriptors / Indicators Aching   Pain Type Chronic pain    Pain Onset More than a month ago   Pain Frequency Intermittent                         [facility] Adult PT Treatment/Exercise - 12/20/16 0001      Ambulation/Gait   Ambulation/Gait Assistance 5: Supervision   Ambulation/Gait Assistance Details using clinic's foot up brace; began with walking sticks and PT holding other end to facilitate arm swing. Noted pt was self-generating swing and only required assist to get back in correct sequence x 1 over 500 ft. Then without sticks and assist pt was able to maintain rt arm swing (even better than left arm!) in sequence x 1000 ft without lapse in proper sequencing; During 1000-2000 ft he had 3 instances of getting out of sequence, was able to self-detect and correct within several steps.    Ambulation Distance (Feet) 4000 Feet  total, some standing breaks for instruction/feedback   Assistive device Other (Comment);None  walking sticks   Gait Pattern Step-through pattern;Decreased trunk rotation;Trunk rotated posteriorly on right;Narrow base of support   Ambulation Surface Level;Outdoor;Paved     Posture/Postural Control   Posture/Postural Control Postural limitations     Self-Care   Self-Care Other Self-Care Comments   Other  Self-Care Comments  Demonstrated donning foot-up brace and then had patient don from the start (applying piece to his shoe/shoelaces). Patient requird min vc on first attempt, less vc on next attempt)           PWR [facility]) - 12/20/16 1300    PWR! Step x 10   Comments pt reported this was the most difficult PWR exercise for him; he required vc (step-by-step instructions) for proper sequencing               PT Short Term Goals - 11/21/16 0540      PT SHORT TERM GOAL #1   Title Pt will perform HEP with wife's supervision, to address Parkinson-specific deficits.  TARGET 12/22/16   Time 5   Period Weeks   Status New   Target Date 12/22/16     PT SHORT TERM GOAL #2   Title Pt will improve 5x  sit<>stand to less than or equal to 15 seconds for improved efficiency and safety with transfers.   Time 5   Period Weeks   Status New   Target Date 12/22/16     PT SHORT TERM GOAL #3   Title Pt will improve TUG score to less than or equal to 13.5 seconds for decreased fall risk.   Time 5   Period Weeks   Status New   Target Date 12/22/16     PT SHORT TERM GOAL #4   Title Pt will improve DGI score to at least 15/24 for decreased fall risk.   Time 5   Period Weeks   Status New   Target Date 12/22/16     PT SHORT TERM GOAL #5   Title Pt/wife will verbalize understanding of local Parkinson's disease resources.   Time 5   Period Weeks   Status New   Target Date 12/22/16           PT Long Term Goals - 11/21/16 0543      PT LONG TERM GOAL #1   Title Pt/wife will verbalize understanding of fall prevention in the home environment.  TARGET 01/19/17   Time 9   Period Weeks   Status New   Target Date 01/19/17     PT LONG TERM GOAL #2   Title Pt will perform sit<>stand from surfaces lower than 18", minimal UE support, at least 6 of 10 reps, modified independently, for improved car and low surface transfers.   Time 9   Period Weeks   Status New   Target Date 01/19/17     PT LONG TERM GOAL #3   Title Pt will improve TUG cognitive score to less tahn or equal to 15 seconds for decreased fall risk.   Time 9   Period Weeks   Status New   Target Date 01/19/17     PT LONG TERM GOAL #4   Title Pt will improve DGI score to at least 19/24 for decreased fall risk.   Time 9   Period Weeks   Status New   Target Date 01/19/17     PT LONG TERM GOAL #5   Title Pt will verbalize plans for continued community fitness upon d/c from PT.   Time 9   Period Weeks   Status New   Target Date 01/19/17               Plan - 12/20/16 1303    Clinical Impression Statement Session focused again on gait training, today utilizing clinic's foot-up brace (he  is still having trouble  getting one ordered for himself). Patient with much better carryover of rt arm swing when therapist stopped assisting via walking poles. He showed improved step length and no dragging of rt foot. Patient's arm swing was significantly better today and feels it was due to use of the foot-up brace and being able to relax and allow arm to swing. Patient continues to improve and can benefit from continued PT.    Rehab Potential Good   Clinical Impairments Affecting Rehab Potential scoliosis (not a surgical candidate) with chronic back pain   PT Frequency 2x / week  1x/wk for 1 week, then 2x/wk for 8 weeks   PT Duration Other (comment)  total POC = 9 weeks   PT Treatment/Interventions ADLs/Self Care Home Management;Gait training;DME Instruction;Functional mobility training;Therapeutic activities;Therapeutic exercise;Balance training;Neuromuscular re-education;Patient/family education   PT Next Visit Plan check STGs (oops Thimothy Barretta missed!); check if pt has got his foot-up brace, If not, practice how to don our brace; continue to work on foot clearance and arm swing (did well using walking poles outside to assist arm swing); ?initiate standing PWR vs review Modified quadruped PWR! MOves as part of HEP (required max cues 10/22),   Consulted and Agree with Plan of Care Patient;Family member/caregiver   Family Member Consulted wife      Patient will benefit from skilled therapeutic intervention in order to improve the following deficits and impairments:  Abnormal gait, Decreased balance, Decreased mobility, Decreased strength, Difficulty walking, Impaired tone, Impaired flexibility, Postural dysfunction, Pain (PT will monitor pain, but will not specifically address as a goal, as pain is longstanding due to scoliosis)  Visit Diagnosis: Other symptoms and signs involving the nervous system  Abnormal posture  Other abnormalities of gait and mobility     Problem List Patient Active Problem List   Diagnosis  Date Noted  . Lumbar back pain with radiculopathy affecting right lower extremity 09/19/2016  . Elevated transaminase level 12/20/2015  . Chronic fatigue 12/02/2015  . Raynaud's phenomenon 04/16/2014  . Insomnia 03/31/2014  . Syncope 11/11/2012  . Autonomic postural hypotension 11/11/2012  . Parkinson's disease ([place]) 06/05/2012  . Restless leg syndrome 11/15/2011  . Primary hypothyroidism 09/11/2011  . Parkinsonism ([place]) 04/02/2011  . Depression, major, recurrent, in partial remission ([place]) 04/02/2011    Rexanne Mano , PT 12/20/2016, 1:09 PM  [facility] [address] [place], [place], [address] Phone: [phone]   Fax:  [phone]  Name: Timm Bonenberger MRN: [id number] Date of Birth: [birth year]

## 2016-12-20 NOTE — Patient Instructions (Signed)
  Please complete the assigned speech therapy homework prior to your next session and return it to the speech therapist at your next visit.  

## 2016-12-25 ENCOUNTER — Ambulatory Visit: Payer: Medicare Other | Admitting: Speech Pathology

## 2016-12-25 ENCOUNTER — Encounter: Payer: Self-pay | Admitting: Speech Pathology

## 2016-12-25 ENCOUNTER — Encounter: Payer: Self-pay | Admitting: Occupational Therapy

## 2016-12-25 ENCOUNTER — Encounter: Payer: Self-pay | Admitting: Physical Therapy

## 2016-12-25 ENCOUNTER — Ambulatory Visit: Payer: Medicare Other | Attending: Neurology | Admitting: Physical Therapy

## 2016-12-25 ENCOUNTER — Ambulatory Visit: Payer: Medicare Other | Admitting: Occupational Therapy

## 2016-12-25 DIAGNOSIS — R2689 Other abnormalities of gait and mobility: Secondary | ICD-10-CM | POA: Insufficient documentation

## 2016-12-25 DIAGNOSIS — R2681 Unsteadiness on feet: Secondary | ICD-10-CM | POA: Diagnosis not present

## 2016-12-25 DIAGNOSIS — R41842 Visuospatial deficit: Secondary | ICD-10-CM | POA: Diagnosis not present

## 2016-12-25 DIAGNOSIS — R29898 Other symptoms and signs involving the musculoskeletal system: Secondary | ICD-10-CM | POA: Diagnosis not present

## 2016-12-25 DIAGNOSIS — R471 Dysarthria and anarthria: Secondary | ICD-10-CM | POA: Insufficient documentation

## 2016-12-25 DIAGNOSIS — R41840 Attention and concentration deficit: Secondary | ICD-10-CM | POA: Diagnosis not present

## 2016-12-25 DIAGNOSIS — R29818 Other symptoms and signs involving the nervous system: Secondary | ICD-10-CM

## 2016-12-25 DIAGNOSIS — R278 Other lack of coordination: Secondary | ICD-10-CM

## 2016-12-25 DIAGNOSIS — M6281 Muscle weakness (generalized): Secondary | ICD-10-CM | POA: Diagnosis not present

## 2016-12-25 DIAGNOSIS — R1312 Dysphagia, oropharyngeal phase: Secondary | ICD-10-CM | POA: Insufficient documentation

## 2016-12-25 DIAGNOSIS — R293 Abnormal posture: Secondary | ICD-10-CM

## 2016-12-25 NOTE — Therapy (Signed)
[facility] [address] [place], [place], [address] Phone: [phone]   Fax:  [phone]  Speech Language Pathology Treatment  Patient Details  Name: [name] MRN: [id number] Date of Birth: 01-02-1946 Referring Provider: Dr. Wells Guiles Tat   Encounter Date: 12/25/2016  End of Session - 12/25/16 1159    Visit Number  5    Number of Visits  17    Date for SLP Re-Evaluation  01/19/17    SLP Start Time  1103    SLP Stop Time   1145    SLP Time Calculation (min)  42 min    Activity Tolerance  Patient tolerated treatment well       Past Medical History:  Diagnosis Date  . Allergy   . Anemia   . Depression   . Parkinson's disease ([place])   . Parkinson's disease ([place]) 06/05/2012  . Raynaud's syndrome   . Thyroid disease     Past Surgical History:  Procedure Laterality Date  . APPENDECTOMY    . FRACTURE SURGERY     jaw  . HERNIA REPAIR    . MANDIBLE FRACTURE SURGERY    . ROTATOR CUFF REPAIR    . TONSILLECTOMY    . VASECTOMY    . WRIST FRACTURE SURGERY      There were no vitals filed for this visit.  Subjective Assessment - 12/25/16 1104    Subjective  Pt/wife both report pt is doing much better with following swallow precautions.    Patient is accompained by:  Family member    Special Tests  spouse, [name]    Currently in Pain?  No/denies            ADULT SLP TREATMENT - 12/25/16 1105      General Information   Behavior/Cognition  Alert;Cooperative;Pleasant mood      Treatment Provided   Treatment provided  Dysphagia;Cognitive-Linquistic      Dysphagia Treatment   Temperature Spikes Noted  No    Respiratory Status  Room air    Oral Cavity - Dentition  Adequate natural dentition    Treatment Methods  Therapeutic exercise;Compensation strategy training;Patient/caregiver education    Patient observed directly with PO's  No    Pharyngeal Phase Signs & Symptoms  Immediate cough;Delayed  throat clear    Other treatment/comments  Supervision cues for swallow precautionsn with PO trials. Pt perfromed HEP with rare min A, verbalized s/s of aspiration pna with min questioning cues.       Pain Assessment   Pain Assessment  No/denies pain      Cognitive-Linquistic Treatment   Treatment focused on  Dysarthria    Skilled Treatment   Loud /a/ to recalibrate volume with average of 95dB, supervision cues. Structured speech tasks with average of 70dB with occasional min A for volume. Simple conversation with average of 68db and usual min visual cues for volume When conversation reqired specific memory/recall, volume reduced due to cognitive load.      Assessment / Recommendations / Plan   Plan  Continue with current plan of care      Progression Toward Goals   Progression toward goals  Progressing toward goals       SLP Education - 12/25/16 1154    Education provided  Yes    Education Details  avoid mixed consistencies (cereal)       SLP Short Term Goals - 12/25/16 1158      SLP SHORT TERM GOAL #1  Title  Pt will average 90dB on loud "AH" with rare min A over 3 sessions.    Baseline  12/25/16,    Time  2    Period  Weeks    Status  On-going      SLP SHORT TERM GOAL #2   Title  Pt will perform HEP for dysphagia and dysarthria with occasional min A over 3 sessions    Baseline  12/25/16,    Time  3    Period  Weeks    Status  On-going      SLP SHORT TERM GOAL #3   Title  Pt will follow diet modificiations/swallow precautions with occasional min A over 2 sessions    Baseline  12/25/16    Time  3    Period  Weeks    Status  On-going      SLP SHORT TERM GOAL #4   Title  Pt will average 72dB on structured speech tasks 10/12 sentences with occasional min A    Time  2    Period  Weeks    Status  On-going      SLP SHORT TERM GOAL #5   Title  Pt will utlize compensations for dysarthria/slur at sentence level 10/12 sentences with occasional min A    Time  2    Period   Weeks    Status  On-going       SLP Long Term Goals - 12/25/16 1159      SLP LONG TERM GOAL #1   Title  Pt will average 92dB on loud "AH" over 3 sessions with rare min A    Time  6    Period  Weeks    Status  On-going      SLP LONG TERM GOAL #2   Title  Pt will complete HEP for dysphagia and dysarthria with rare min A over 2 sessions    Time  6    Period  Weeks    Status  On-going      SLP LONG TERM GOAL #3   Title  Spouse/pt will report carryover of swallow precautions/diet modifications outside of therapy with occasional min cues from wife, over 3 sessions    Time  6    Period  Weeks    Status  On-going      SLP LONG TERM GOAL #4   Title  Pt will average 70 dB over 15 minute conversation with rare min A over 3 sessions    Time  6    Period  Weeks    Status  On-going      SLP LONG TERM GOAL #5   Title  Pt will be audible and intelligible in noisy environment during 15 minute conversation over 2 sessions.    Time  6    Period  Weeks    Status  On-going       Plan - 12/25/16 1155    Clinical Impression Statement  Pt followed swallow precautions with supervision cues when no distractions (conversation) present. Spouse reports good carryover at home with reduced s/s of aspiration at home. HEP for dysphagia with min A. Pt continues to reqiure min to mod A for carryover of WNL volume in conversation. Continue skilled ST to maximize intelligibility and safety of swallow.     Speech Therapy Frequency  2x / week    Treatment/Interventions  SLP instruction and feedback;Internal/external aids;Compensatory strategies;Environmental controls;Oral motor exercises;Patient/family education;Pharyngeal strengthening exercises;Functional tasks;Language facilitation    Potential to  Achieve Goals  Good    Potential Considerations  Cooperation/participation level    SLP Home Exercise Plan  Loud "AH", dysphagia HEP; dysarthria HEP       Patient will benefit from skilled therapeutic  intervention in order to improve the following deficits and impairments:   Dysphagia, oropharyngeal phase  Dysarthria and anarthria    Problem List Patient Active Problem List   Diagnosis Date Noted  . Lumbar back pain with radiculopathy affecting right lower extremity 09/19/2016  . Elevated transaminase level 12/20/2015  . Chronic fatigue 12/02/2015  . Raynaud's phenomenon 04/16/2014  . Insomnia 03/31/2014  . Syncope 11/11/2012  . Autonomic postural hypotension 11/11/2012  . Parkinson's disease ([place]) 06/05/2012  . Restless leg syndrome 11/15/2011  . Primary hypothyroidism 09/11/2011  . Parkinsonism ([place]) 04/02/2011  . Depression, major, recurrent, in partial remission ([place]) 04/02/2011    Greer Wainright, Annye Rusk  MS, CCC-SLP 12/25/2016, 12:01 PM  [facility] [address] [place], [place], [address] Phone: [phone]   Fax:  [phone]   Name: Zelma Mazariego MRN: [id number] Date of Birth: 1945-04-01

## 2016-12-25 NOTE — Therapy (Signed)
[facility] [address] [place], [place], [address] Phone: [phone]   Fax:  [phone]  Occupational Therapy Treatment  Patient Details  Name: [name] MRN: [id number] Date of Birth: 12/02/1945 Referring Provider: Dr. Carles Collet   Encounter Date: 12/25/2016  OT End of Session - 12/25/16 1034    Visit Number  10    Number of Visits  17    Date for OT Re-Evaluation  01/19/17    Authorization Type  MCR Primary - G code needed, UHC secondary    Authorization - Visit Number  10    Authorization - Number of Visits  10    OT Start Time  1021    OT Stop Time  1100    OT Time Calculation (min)  39 min    Activity Tolerance  Patient tolerated treatment well    Behavior During Therapy  [facility] for tasks assessed/performed       Past Medical History:  Diagnosis Date  . Allergy   . Anemia   . Depression   . Parkinson's disease ([place])   . Parkinson's disease ([place]) 06/05/2012  . Raynaud's syndrome   . Thyroid disease     Past Surgical History:  Procedure Laterality Date  . APPENDECTOMY    . FRACTURE SURGERY     jaw  . HERNIA REPAIR    . MANDIBLE FRACTURE SURGERY    . ROTATOR CUFF REPAIR    . TONSILLECTOMY    . VASECTOMY    . WRIST FRACTURE SURGERY      There were no vitals filed for this visit.  Subjective Assessment - 12/25/16 1033    Subjective   Pt reports that using leaf blower has gotten easier to start and that use of PWR! hands helps    Patient is accompained by:  Family member    Pertinent History  PD since 2009, spinal stenosis, scoliosis, HOH, Raynauds, h/o falls with radius fx in 2011?, Rt ankle fx 2017, Rt rotator cuff surgery 2003    Limitations  fall risk, limited driving    Patient Stated Goals  put on my watch    Currently in Pain?  No/denies        In standing, functional step and reach forward/backward incorporating trunk rotation, wt. Shift and set-up for large amplitude movements and  PWR! Reach to place large pegs in vertical (overhead pegboard).  Pt with min v.c. For large amplitude movements and min difficulty with LLE step with fatigue (LOB x2, but able to recover unassisted).  In standing, functional reaching to retrieve item from floor to put on overhead shelf with min cueing for large amplitude movement strategies (open hand, feet apart).  Educated pt in strategy for retrieving items from the floor for yard work.  Fastening/unfastening nuts/bolts with each hand with min cueing for large amplitude movement strategies and timing as pt reports difficulty with using screwdriver.  Pt encouraged to use large amplitude movements and if movement gets smaller, stop and re-start big.    Discussed/reviewed importance of medication timing and to journal symptoms to help Dr. Carles Collet establish patterns and determine if pt is having off periods or needs medication adjustments.  Pt/wife verbalized understanding.  Also discussed progress.  Arm bike x 55mn level 3 for reciprocal movement with cues/target of at least 45rpms for intensity while maintaining movement amplitude/reciprocal movement.   Pt maintained 45-65 rpms.  OT Short Term Goals - 12/25/16 1055      OT SHORT TERM GOAL #1   Title  Pt/family will be independent with PD specific HEP including PWR! moves and coordination HEP     Time  4    Period  Weeks    Status  Achieved      OT SHORT TERM GOAL #2   Title  Pt will write at least 3 sentences with no significant decr in size and 95% legibility or greater     Time  4    Period  Weeks    Status  Achieved      OT SHORT TERM GOAL #3   Title  Pt will perform PPT #2 test in 12 sec. or under    Baseline  eval = 15.78 sec    Time  4    Period  Weeks    Status  Achieved 12/20/16 = 8.59 sec   12/20/16 = 8.59 sec     OT SHORT TERM GOAL #4   Title  Pt will perform PPT #4 test in 22 sec. or under    Baseline  eval = 25.29 sec    Time   4    Period  Weeks    Status  Achieved 12/20/16 = 16 sec   12/20/16 = 16 sec     OT SHORT TERM GOAL #5   Title  Pt will increase coordination Rt hand as evidenced by performing 9 hole peg test in 32 sec. or less    Baseline  eval = 36.90 sec    Time  4    Period  Weeks    Status  Achieved Rt = 18.25 sec   Rt = 18.25 sec     OT SHORT TERM GOAL #6   Title  Pt will increase RUE function as evidenced by performing 34 blocks or greater on Box & Blocks test    Baseline  eval = 30 (Lt = 48)     Time  4    Period  Weeks    Status  Achieved Rt = 49 sec   Rt = 49 sec       OT Long Term Goals - 12/25/16 1056      OT LONG TERM GOAL #1   Title  Pt will verbalize understanding of ways to prevent future PD related complications and appropriate community and national resources prn    Time  8    Period  Weeks    Status  New      OT LONG TERM GOAL #2   Title  Pt will verbalize understanding of adaptive strategies to incr ease with ADLS/IADLS    Time  8    Period  Weeks    Status  New      OT LONG TERM GOAL #3   Title  Pt will perform PPT #4 in 19 sec. or under    Baseline  eval = 25.29 sec    Time  8    Period  Weeks    Status  Achieved      OT LONG TERM GOAL #4   Title  Pt will improve functional coordination as evidenced by reducing time on 3 button test to 40 sec. or less    Baseline  59.01 sec. (standing with shirt on)     Time  8    Period  Weeks    Status  New      OT LONG TERM GOAL #  5   Title  Pt will verbalize understanding of ways to keep thinking skills sharp    Time  8    Period  Weeks    Status  New      OT LONG TERM GOAL #6   Title  Pt to report using RUE as dominant UE for ADLS/IADLS 26% of the time    Time  8    Period  Weeks    Status  Achieved January 04, 2017   2017-01-04           Plan - 01-04-2017 1021    Clinical Impression Statement  Pt is progressing toward remaining goals (all STGs met) and has made good progress with RUE coordination and function.  Pt  would benefit from continued occupational thearpy to reinforce large amplitude movements for incr carryover.    Rehab Potential  Good    OT Frequency  2x / week    OT Duration  8 weeks    OT Treatment/Interventions  Self-care/ADL training;DME and/or AE instruction;Patient/family education;Splinting;Therapeutic exercises;Therapeutic activities;Neuromuscular education;Functional Mobility Training;Passive range of motion;Cognitive remediation/compensation;Visual/perceptual remediation/compensation;Manual Therapy    Plan  continue with large amplitude movements for functional tasks/ADLs; add difficulty/cognitive components as able    OT Home Exercise Plan  11/27/16: PWR! Seated, POP info, national websites. 11/29/16: PWR! Hands, tips for constipation. 12/05/16: PWR! Supine. 12/07/16: coordination HEP. 12/11/16: Writing strategies. 12/13/16: movement strategies for ADLS    Consulted and Agree with Plan of Care  Patient;Family member/caregiver    Family Member Consulted  Wife       Patient will benefit from skilled therapeutic intervention in order to improve the following deficits and impairments:  Decreased coordination, Impaired flexibility, Improper body mechanics, Improper spinal/pelvic alignment, Decreased safety awareness, Decreased endurance, Decreased knowledge of precautions, Impaired tone, Impaired UE functional use, Pain, Decreased knowledge of use of DME, Decreased cognition, Decreased mobility, Decreased strength, Impaired perceived functional ability, Impaired vision/preception  Visit Diagnosis: Other symptoms and signs involving the nervous system  Other symptoms and signs involving the musculoskeletal system  Abnormal posture  Other lack of coordination  Other abnormalities of gait and mobility  Unsteadiness on feet  Attention and concentration deficit  Visuospatial deficit  G-Codes - 01-04-17 1316    Functional Assessment Tool Used (Outpatient only)  RUE:  9 hole peg test  18.25sec, Box & Blocks 49 blocks    Carrying, Moving and Handling Objects Current Status (E1583)  At least 1 percent but less than 20 percent impaired, limited or restricted    Carrying, Moving and Handling Objects Goal Status (E9407)  At least 1 percent but less than 20 percent impaired, limited or restricted       Problem List Patient Active Problem List   Diagnosis Date Noted  . Lumbar back pain with radiculopathy affecting right lower extremity 09/19/2016  . Elevated transaminase level 12/20/2015  . Chronic fatigue 12/02/2015  . Raynaud's phenomenon 04/16/2014  . Insomnia 03/31/2014  . Syncope 11/11/2012  . Autonomic postural hypotension 11/11/2012  . Parkinson's disease ([place]) 06/05/2012  . Restless leg syndrome 11/15/2011  . Primary hypothyroidism 09/11/2011  . Parkinsonism ([place]) 04/02/2011  . Depression, major, recurrent, in partial remission ([place]) 04/02/2011    Occupational Therapy Progress Note  Dates of Reporting Period: 11/20/16 to 01/04/2017   Objective Reports of Subjective Statement: see above  Objective Measurements: see above  Goal Update: see above  Plan: see above  Reason Skilled Services are Required: see above   [facility] Jan 04, 2017, 1:17 PM  [facility]  [facility] [address] [place], [place], [address] Phone: [phone]   Fax:  [phone]  Name: Vere Diantonio MRN: [id number] Date of Birth: Aug 13, 1945   Vianne Bulls, OTR/L [facility] [address] [place], [place]  [address] [phone] phone [phone] 12/25/16 3:30 PM

## 2016-12-25 NOTE — Therapy (Signed)
[facility] [address] [place], [place], [address] Phone: [phone]   Fax:  [phone]  Physical Therapy Treatment  Patient Details  Name: [name] MRN: [id number] Date of Birth: [birth year] Referring Provider: Alonza Bogus, DO   Encounter Date: 12/25/2016  PT End of Session - 12/25/16 1816    Visit Number  9    Number of Visits  17    Date for PT Re-Evaluation  01/19/17    Authorization Type  Medicare, UHC    PT Start Time  0933    PT Stop Time  1016    PT Time Calculation (min)  43 min    Activity Tolerance  Patient tolerated treatment well    Behavior During Therapy  [facility] for tasks assessed/performed       Past Medical History:  Diagnosis Date  . Allergy   . Anemia   . Depression   . Parkinson's disease ([place])   . Parkinson's disease ([place]) 06/05/2012  . Raynaud's syndrome   . Thyroid disease     Past Surgical History:  Procedure Laterality Date  . APPENDECTOMY    . FRACTURE SURGERY     jaw  . HERNIA REPAIR    . MANDIBLE FRACTURE SURGERY    . ROTATOR CUFF REPAIR    . TONSILLECTOMY    . VASECTOMY    . WRIST FRACTURE SURGERY      There were no vitals filed for this visit.  Subjective Assessment - 12/25/16 0936    Subjective  Got his foot up brace this weekend. Has not tried it    Patient is accompained by:  Family member    Pertinent History  anemia, depression, Parkinson's disease dx 2009, R foot fracture ([facility] for foot fracture in March 2018); R RTC repair, scoliosis (not a surgical candidate)    Patient Stated Goals  Pt's goals for therapy include exercises for back, help with more mobility in R hand; be able to stand up straighter and walk better (per wife)    Currently in Pain?  No/denies    Pain Onset  More than a month ago       Gait training- coordinating arm swing with longer step length and emphasizing Rt heelstrike. Pt with incr stiffness and difficulty  with arm swing today compared to 10/31 with pt reporting he is 'at the end of his medicine" (levodopa taken part way through session).     [facility] PT Assessment - 12/25/16 0001      Transfers   Five time sit to stand comments   15.37      Dynamic Gait Index   Level Surface  Mild Impairment    Change in Gait Speed  Mild Impairment    Gait with Horizontal Head Turns  Mild Impairment    Gait with Vertical Head Turns  Mild Impairment    Gait and Pivot Turn  Normal    Step Over Obstacle  Normal    Step Around Obstacles  Mild Impairment    Steps  Mild Impairment    Total Score  18      Timed Up and Go Test   Normal TUG (seconds)  7.97    Cognitive TUG (seconds)  18.28 counting backwards by 1's   counting backwards by 1's                         PT Education - 12/25/16 1814  Education provided  Yes    Education Details  donning foot up brace (instructional cues and attempting 3 different techniques before pt successful)    Person(s) Educated  Patient;Spouse    Methods  Explanation;Demonstration;Verbal cues    Comprehension  Verbalized understanding;Returned demonstration;Verbal cues required       PT Short Term Goals - 12/25/16 0957      PT SHORT TERM GOAL #1   Title  Pt will perform HEP with wife's supervision, to address Parkinson-specific deficits.  TARGET 12/22/16    Time  5    Period  Weeks    Status  Achieved      PT SHORT TERM GOAL #2   Title  Pt will improve 5x sit<>stand to less than or equal to 15 seconds for improved efficiency and safety with transfers.    Baseline  11/5 15.37 sec    Time  5    Period  Weeks    Status  Partially Met      PT SHORT TERM GOAL #3   Title  Pt will improve TUG score to less than or equal to 13.5 seconds for decreased fall risk.    Baseline  11/5 7.97 sec    Time  5    Period  Weeks    Status  Achieved      PT SHORT TERM GOAL #4   Title  Pt will improve DGI score to at least 15/24 for decreased fall risk.     Baseline  11/5 18/24    Time  5    Period  Weeks    Status  Achieved      PT SHORT TERM GOAL #5   Title  Pt/wife will verbalize understanding of local Parkinson's disease resources.    Baseline  11/5 pt did not recall receiving resource information; wife thought they had    Time  5    Period  Weeks    Status  Achieved        PT Long Term Goals - 11/21/16 0543      PT LONG TERM GOAL #1   Title  Pt/wife will verbalize understanding of fall prevention in the home environment.  TARGET 01/19/17    Time  9    Period  Weeks    Status  New    Target Date  01/19/17      PT LONG TERM GOAL #2   Title  Pt will perform sit<>stand from surfaces lower than 18", minimal UE support, at least 6 of 10 reps, modified independently, for improved car and low surface transfers.    Time  9    Period  Weeks    Status  New    Target Date  01/19/17      PT LONG TERM GOAL #3   Title  Pt will improve TUG cognitive score to less tahn or equal to 15 seconds for decreased fall risk.    Time  9    Period  Weeks    Status  New    Target Date  01/19/17      PT LONG TERM GOAL #4   Title  Pt will improve DGI score to at least 19/24 for decreased fall risk.    Time  9    Period  Weeks    Status  New    Target Date  01/19/17      PT LONG TERM GOAL #5   Title  Pt will verbalize plans for continued community fitness  upon d/c from PT.    Time  9    Period  Weeks    Status  New    Target Date  01/19/17            Plan - 12/25/16 1817    Clinical Impression Statement  Session focused on checking STGs and education in donning his new foot-up brace. 4 of 5 STGs met with one goal partially met (had improved by not to goal level). Patient reports he can tell he has improved with his walking. Patient continues to benefit from PT.     Rehab Potential  Good    Clinical Impairments Affecting Rehab Potential  scoliosis (not a surgical candidate) with chronic back pain    PT Frequency  2x / week 1x/wk for 1  week, then 2x/wk for 8 weeks   1x/wk for 1 week, then 2x/wk for 8 weeks   PT Duration  Other (comment) total POC = 9 weeks   total POC = 9 weeks   PT Treatment/Interventions  ADLs/Self Care Home Management;Gait training;DME Instruction;Functional mobility training;Therapeutic activities;Therapeutic exercise;Balance training;Neuromuscular re-education;Patient/family education    PT Next Visit Plan  continue to work on foot clearance and arm swing (did well using walking poles outside to assist arm swing); ?initiate standing PWR vs review Modified quadruped PWR! MOves as part of HEP (required max cues 10/22),    Consulted and Agree with Plan of Care  Patient;Family member/caregiver    Family Member Consulted  wife       Patient will benefit from skilled therapeutic intervention in order to improve the following deficits and impairments:  Abnormal gait, Decreased balance, Decreased mobility, Decreased strength, Difficulty walking, Impaired tone, Impaired flexibility, Postural dysfunction, Pain(PT will monitor pain, but will not specifically address as a goal, as pain is longstanding due to scoliosis)  Visit Diagnosis: Other symptoms and signs involving the nervous system  Other symptoms and signs involving the musculoskeletal system  Other abnormalities of gait and mobility     Problem List Patient Active Problem List   Diagnosis Date Noted  . Lumbar back pain with radiculopathy affecting right lower extremity 09/19/2016  . Elevated transaminase level 12/20/2015  . Chronic fatigue 12/02/2015  . Raynaud's phenomenon 04/16/2014  . Insomnia 03/31/2014  . Syncope 11/11/2012  . Autonomic postural hypotension 11/11/2012  . Parkinson's disease ([place]) 06/05/2012  . Restless leg syndrome 11/15/2011  . Primary hypothyroidism 09/11/2011  . Parkinsonism ([place]) 04/02/2011  . Depression, major, recurrent, in partial remission ([place]) 04/02/2011    Rexanne Mano, PT 12/25/2016, 6:24 PM  [facility] [address] [place], [place], [address] Phone: [phone]   Fax:  [phone]  Name: [name] MRN: [id number] Date of Birth: Jun 17, 1945

## 2016-12-27 ENCOUNTER — Ambulatory Visit: Payer: Medicare Other

## 2016-12-27 ENCOUNTER — Ambulatory Visit: Payer: Medicare Other | Admitting: Physical Therapy

## 2016-12-27 ENCOUNTER — Encounter: Payer: Self-pay | Admitting: Physical Therapy

## 2016-12-27 ENCOUNTER — Ambulatory Visit: Payer: Medicare Other | Admitting: Occupational Therapy

## 2016-12-27 DIAGNOSIS — R1312 Dysphagia, oropharyngeal phase: Secondary | ICD-10-CM

## 2016-12-27 DIAGNOSIS — R29818 Other symptoms and signs involving the nervous system: Secondary | ICD-10-CM | POA: Diagnosis not present

## 2016-12-27 DIAGNOSIS — R29898 Other symptoms and signs involving the musculoskeletal system: Secondary | ICD-10-CM

## 2016-12-27 DIAGNOSIS — R471 Dysarthria and anarthria: Secondary | ICD-10-CM

## 2016-12-27 DIAGNOSIS — R41840 Attention and concentration deficit: Secondary | ICD-10-CM

## 2016-12-27 DIAGNOSIS — R293 Abnormal posture: Secondary | ICD-10-CM | POA: Diagnosis not present

## 2016-12-27 DIAGNOSIS — R2689 Other abnormalities of gait and mobility: Secondary | ICD-10-CM

## 2016-12-27 DIAGNOSIS — M6281 Muscle weakness (generalized): Secondary | ICD-10-CM | POA: Diagnosis not present

## 2016-12-27 DIAGNOSIS — R2681 Unsteadiness on feet: Secondary | ICD-10-CM

## 2016-12-27 DIAGNOSIS — R278 Other lack of coordination: Secondary | ICD-10-CM | POA: Diagnosis not present

## 2016-12-27 NOTE — Therapy (Signed)
[facility] [address] [place], [place], [address] Phone: [phone]   Fax:  [phone]  Occupational Therapy Treatment  Patient Details  Name: [name] MRN: [id number] Date of Birth: 06/18/1945 Referring Provider: Dr. Carles Collet   Encounter Date: 12/27/2016  OT End of Session - 12/27/16 1101    Visit Number  11    Number of Visits  17    Date for OT Re-Evaluation  01/19/17    Authorization Type  MCR Primary - G code needed, UHC secondary    Authorization - Visit Number  11    Authorization - Number of Visits  20    OT Start Time  1020    OT Stop Time  1100    OT Time Calculation (min)  40 min       Past Medical History:  Diagnosis Date  . Allergy   . Anemia   . Depression   . Parkinson's disease ([place])   . Parkinson's disease ([place]) 06/05/2012  . Raynaud's syndrome   . Thyroid disease     Past Surgical History:  Procedure Laterality Date  . APPENDECTOMY    . FRACTURE SURGERY     jaw  . HERNIA REPAIR    . MANDIBLE FRACTURE SURGERY    . ROTATOR CUFF REPAIR    . TONSILLECTOMY    . VASECTOMY    . WRIST FRACTURE SURGERY      There were no vitals filed for this visit.  Subjective Assessment - 12/27/16 1024    Patient is accompained by:  Family member    Pertinent History  PD since 2009, spinal stenosis, scoliosis, HOH, Raynauds, h/o falls with radius fx in 2011?, Rt ankle fx 2017, Rt rotator cuff surgery 2003    Limitations  fall risk, limited driving    Patient Stated Goals  put on my watch    Currently in Pain?  No/denies         Treatment: issued and reviewed memory strategies and how to implement, and issued/reviewed practical ways to keep thinking skills sharp and provided examples for divided attention and new learning.   Standing to perform stepping patterns for weight shifts while following directions by calling out words/instructions, then calling out colors for cognition, divided  attention and reciprocal inhibition. Pt also required cues to keep stepping pattern large/big and difficulty with divided attn                  OT Education - 12/27/16 1051    Education provided  Yes    Education Details  memory strategies, ways to keep thinking skills sharp    Person(s) Educated  Patient;Spouse    Methods  Explanation;Handout    Comprehension  Verbalized understanding       OT Short Term Goals - 12/25/16 1055      OT SHORT TERM GOAL #1   Title  Pt/family will be independent with PD specific HEP including PWR! moves and coordination HEP     Time  4    Period  Weeks    Status  Achieved      OT SHORT TERM GOAL #2   Title  Pt will write at least 3 sentences with no significant decr in size and 95% legibility or greater     Time  4    Period  Weeks    Status  Achieved      OT SHORT TERM GOAL #3   Title  Pt will  perform PPT #2 test in 12 sec. or under    Baseline  eval = 15.78 sec    Time  4    Period  Weeks    Status  Achieved 12/20/16 = 8.59 sec   12/20/16 = 8.59 sec     OT SHORT TERM GOAL #4   Title  Pt will perform PPT #4 test in 22 sec. or under    Baseline  eval = 25.29 sec    Time  4    Period  Weeks    Status  Achieved 12/20/16 = 16 sec   12/20/16 = 16 sec     OT SHORT TERM GOAL #5   Title  Pt will increase coordination Rt hand as evidenced by performing 9 hole peg test in 32 sec. or less    Baseline  eval = 36.90 sec    Time  4    Period  Weeks    Status  Achieved Rt = 18.25 sec   Rt = 18.25 sec     OT SHORT TERM GOAL #6   Title  Pt will increase RUE function as evidenced by performing 34 blocks or greater on Box & Blocks test    Baseline  eval = 30 (Lt = 48)     Time  4    Period  Weeks    Status  Achieved Rt = 49 sec   Rt = 49 sec       OT Long Term Goals - 12/27/16 1151      OT LONG TERM GOAL #1   Title  Pt will verbalize understanding of ways to prevent future PD related complications and appropriate community and  national resources prn    Time  8    Period  Weeks    Status  On-going      OT LONG TERM GOAL #2   Title  Pt will verbalize understanding of adaptive strategies to incr ease with ADLS/IADLS    Time  8    Period  Weeks    Status  On-going      OT LONG TERM GOAL #3   Title  Pt will perform PPT #4 in 19 sec. or under    Baseline  eval = 25.29 sec    Time  8    Period  Weeks    Status  Achieved      OT LONG TERM GOAL #4   Title  Pt will improve functional coordination as evidenced by reducing time on 3 button test to 40 sec. or less    Baseline  59.01 sec. (standing with shirt on)     Time  8    Period  Weeks    Status  On-going      OT LONG TERM GOAL #5   Title  Pt will verbalize understanding of ways to keep thinking skills sharp    Time  8    Period  Weeks    Status  On-going      OT LONG TERM GOAL #6   Title  Pt to report using RUE as dominant UE for ADLS/IADLS 38% of the time    Time  8    Period  Weeks    Status  Achieved 12/25/16   12/25/16           Plan - 12/27/16 1152    Clinical Impression Statement  Pt making progress towards remaining LTG's and continues to make progress in functional tasks.  Rehab Potential  Good    OT Frequency  2x / week    OT Duration  8 weeks    OT Treatment/Interventions  Self-care/ADL training;DME and/or AE instruction;Patient/family education;Splinting;Therapeutic exercises;Therapeutic activities;Neuromuscular education;Functional Mobility Training;Passive range of motion;Cognitive remediation/compensation;Visual/perceptual remediation/compensation;Manual Therapy    Plan  continue stepping patterns following instructions, divided attention with reaching/standing tasks and ambulation    OT Home Exercise Plan  11/27/16: PWR! Seated, POP info, national websites. 11/29/16: PWR! Hands, tips for constipation. 12/05/16: PWR! Supine. 12/07/16: coordination HEP. 12/11/16: Writing strategies. 12/13/16: movement strategies for ADLS. 12/27/16:  memory strategies/ways to keep thinking skills sharp    Consulted and Agree with Plan of Care  Patient;Family member/caregiver    Family Member Consulted  Wife       Patient will benefit from skilled therapeutic intervention in order to improve the following deficits and impairments:  Decreased coordination, Impaired flexibility, Improper body mechanics, Improper spinal/pelvic alignment, Decreased safety awareness, Decreased endurance, Decreased knowledge of precautions, Impaired tone, Impaired UE functional use, Pain, Decreased knowledge of use of DME, Decreased cognition, Decreased mobility, Decreased strength, Impaired perceived functional ability, Impaired vision/preception  Visit Diagnosis: Other symptoms and signs involving the nervous system  Other symptoms and signs involving the musculoskeletal system  Unsteadiness on feet  Attention and concentration deficit    Problem List Patient Active Problem List   Diagnosis Date Noted  . Lumbar back pain with radiculopathy affecting right lower extremity 09/19/2016  . Elevated transaminase level 12/20/2015  . Chronic fatigue 12/02/2015  . Raynaud's phenomenon 04/16/2014  . Insomnia 03/31/2014  . Syncope 11/11/2012  . Autonomic postural hypotension 11/11/2012  . Parkinson's disease ([place]) 06/05/2012  . Restless leg syndrome 11/15/2011  . Primary hypothyroidism 09/11/2011  . Parkinsonism ([place]) 04/02/2011  . Depression, major, recurrent, in partial remission ([place]) 04/02/2011    Carey Bullocks, OTR/L 12/27/2016, 11:56 AM  [facility] [address] [place], [place], [address] Phone: [phone]   Fax:  [phone]  Name: Mylin Hirano MRN: [id number] Date of Birth: 1945-12-11

## 2016-12-27 NOTE — Patient Instructions (Signed)
  Please complete the assigned speech therapy homework prior to your next session and return it to the speech therapist at your next visit.  

## 2016-12-27 NOTE — Patient Instructions (Signed)
Hip Flexor Stretch, Prone    Bend both knees up with feet flat on the floor. Bring one knee to your chest and hold knee with both hands. Extend the other leg down to the floor. Hold for __30__ seconds. Repeat __2__ times each leg.     Supine Knee-to-Chest, Bilateral    Lie on back, hands clasped behind both knees. Pull knees in toward chest until a comfortable stretch is felt in lower back and buttocks. Hold __30_ seconds. Repeat _1-2__ times per session. Do __1_ sessions per day.  Copyright  VHI. All rights reserved.    Unsupported Pelvic Tilt    Sit on front edge of your seat. Gently rock pelvis forward (push your belly button forward) and then roll your pelvis backward (slouch, imagine getting punched in the belly, pull belly button in). Do __2_ sets of __10_ repetitions.  Copyright  VHI. All rights reserved.

## 2016-12-27 NOTE — Therapy (Signed)
[facility] [address] [place], [place], [address] Phone: [phone]   Fax:  [phone]  Physical Therapy Treatment  Patient Details  Name: [name] MRN: [id number] Date of Birth: 04-26-1945 Referring Provider: Alonza Bogus, DO   Encounter Date: 12/27/2016  PT End of Session - 12/27/16 7517    Visit Number  10 next g-code on visit 20   next g-code on visit 20   Number of Visits  17    Date for PT Re-Evaluation  01/19/17    Authorization Type  Medicare, UHC    PT Start Time  0932    PT Stop Time  1015    PT Time Calculation (min)  43 min    Activity Tolerance  Patient tolerated treatment well    Behavior During Therapy  [facility] for tasks assessed/performed       Past Medical History:  Diagnosis Date  . Allergy   . Anemia   . Depression   . Parkinson's disease ([place])   . Parkinson's disease ([place]) 06/05/2012  . Raynaud's syndrome   . Thyroid disease     Past Surgical History:  Procedure Laterality Date  . APPENDECTOMY    . FRACTURE SURGERY     jaw  . HERNIA REPAIR    . MANDIBLE FRACTURE SURGERY    . ROTATOR CUFF REPAIR    . TONSILLECTOMY    . VASECTOMY    . WRIST FRACTURE SURGERY      There were no vitals filed for this visit.  Subjective Assessment - 12/27/16 0931    Subjective  Doing better with his foot-up brace.    Patient is accompained by:  Family member    Pertinent History  anemia, depression, Parkinson's disease dx 2009, R foot fracture [facility] PT for foot fracture in March 2018); R RTC repair, scoliosis (not a surgical candidate)    Patient Stated Goals  Pt's goals for therapy include exercises for back, help with more mobility in R hand; be able to stand up straighter and walk better (per wife)    Currently in Pain?  No/denies    Pain Onset  More than a month ago                      [facility] Adult PT Treatment/Exercise - 12/27/16 0001      Ambulation/Gait   Ambulation/Gait Assistance  5: Supervision    Ambulation/Gait Assistance Details  pt with occasional difficulty coordinating arm swing with legs, however after one vc he was able to then self-detect errors and correct with ~6 steps; tried different vc for upright posture with pt ultimately reporting he does not stand fully upright due to his back pain    Ambulation Distance (Feet)  1500 Feet    Assistive device  None    Gait Pattern  Step-through pattern;Decreased trunk rotation;Trunk rotated posteriorly on right;Trunk flexed    Ambulation Surface  Outdoor      See Pt instructions--all exercises given for HEP completed with multiple repetitions for education.Patient especially had difficulty learning to posteriorly tilt pelvis (and did not have much anterior tilt), however by end of session returned to sitting anterior-posterior tilt with much improved ROM.        PT Education - 12/27/16 1603    Education provided  Yes    Education Details  additions to HEP to improve lumbar lordosis/pelvic mobility    Person(s) Educated  Patient;Spouse    Methods  Explanation;Demonstration;Tactile cues;Verbal cues;Handout    Comprehension  Verbalized understanding;Returned demonstration;Verbal cues required;Tactile cues required;Need further instruction       PT Short Term Goals - 12/25/16 0957      PT SHORT TERM GOAL #1   Title  Pt will perform HEP with wife's supervision, to address Parkinson-specific deficits.  TARGET 12/22/16    Time  5    Period  Weeks    Status  Achieved      PT SHORT TERM GOAL #2   Title  Pt will improve 5x sit<>stand to less than or equal to 15 seconds for improved efficiency and safety with transfers.    Baseline  11/5 15.37 sec    Time  5    Period  Weeks    Status  Partially Met      PT SHORT TERM GOAL #3   Title  Pt will improve TUG score to less than or equal to 13.5 seconds for decreased fall risk.    Baseline  11/5 7.97 sec    Time  5     Period  Weeks    Status  Achieved      PT SHORT TERM GOAL #4   Title  Pt will improve DGI score to at least 15/24 for decreased fall risk.    Baseline  11/5 18/24    Time  5    Period  Weeks    Status  Achieved      PT SHORT TERM GOAL #5   Title  Pt/wife will verbalize understanding of local Parkinson's disease resources.    Baseline  11/5 pt did not recall receiving resource information; wife thought they had    Time  5    Period  Weeks    Status  Achieved        PT Long Term Goals - 11/21/16 0543      PT LONG TERM GOAL #1   Title  Pt/wife will verbalize understanding of fall prevention in the home environment.  TARGET 01/19/17    Time  9    Period  Weeks    Status  New    Target Date  01/19/17      PT LONG TERM GOAL #2   Title  Pt will perform sit<>stand from surfaces lower than 18", minimal UE support, at least 6 of 10 reps, modified independently, for improved car and low surface transfers.    Time  9    Period  Weeks    Status  New    Target Date  01/19/17      PT LONG TERM GOAL #3   Title  Pt will improve TUG cognitive score to less tahn or equal to 15 seconds for decreased fall risk.    Time  9    Period  Weeks    Status  New    Target Date  01/19/17      PT LONG TERM GOAL #4   Title  Pt will improve DGI score to at least 19/24 for decreased fall risk.    Time  9    Period  Weeks    Status  New    Target Date  01/19/17      PT LONG TERM GOAL #5   Title  Pt will verbalize plans for continued community fitness upon d/c from PT.    Time  9    Period  Weeks    Status  New    Target Date  01/19/17  Plan - 01-01-2017 1606    Clinical Impression Statement  Focus of session on gait training with pt reporting difficulty achieving upright posture due to increases his LBP. Remainder of session focused on ROM/stretching exercises for his low back and pelvis. At end of session, pt with increased pelvic rotation and upright posture with decr pain.  Patient can continue to benefit from PT    Rehab Potential  Good    Clinical Impairments Affecting Rehab Potential  scoliosis (not a surgical candidate) with chronic back pain    PT Frequency  2x / week 1x/wk for 1 week, then 2x/wk for 8 weeks   1x/wk for 1 week, then 2x/wk for 8 weeks   PT Duration  Other (comment) total POC = 9 weeks   total POC = 9 weeks   PT Treatment/Interventions  ADLs/Self Care Home Management;Gait training;DME Instruction;Functional mobility training;Therapeutic activities;Therapeutic exercise;Balance training;Neuromuscular re-education;Patient/family education    PT Next Visit Plan  review stretches given 2023/01/02 and how his LBP is doing; ? impacting posture with walking; ? review standing PWR vs review Modified quadruped PWR! MOves as part of HEP (required max cues 10/22),    Consulted and Agree with Plan of Care  Patient;Family member/caregiver    Family Member Consulted  wife       Patient will benefit from skilled therapeutic intervention in order to improve the following deficits and impairments:  Abnormal gait, Decreased balance, Decreased mobility, Decreased strength, Difficulty walking, Impaired tone, Impaired flexibility, Postural dysfunction, Pain(PT will monitor pain, but will not specifically address as a goal, as pain is longstanding due to scoliosis)  Visit Diagnosis: Other abnormalities of gait and mobility  Abnormal posture  Other symptoms and signs involving the musculoskeletal system   G-Codes - 01/01/17 1619    Functional Assessment Tool Used (Outpatient Only)  5x: 15.37 sec; TUG 7.97 sec, TUG cog 18.28; DGI 18    Functional Limitation  Mobility: Walking and moving around    Mobility: Walking and Moving Around Current Status [phone])  At least 20 percent but less than 40 percent impaired, limited or restricted    Mobility: Walking and Moving Around Goal Status [phone])  At least 20 percent but less than 40 percent impaired, limited or restricted        Problem List Patient Active Problem List   Diagnosis Date Noted  . Lumbar back pain with radiculopathy affecting right lower extremity 09/19/2016  . Elevated transaminase level 12/20/2015  . Chronic fatigue 12/02/2015  . Raynaud's phenomenon 04/16/2014  . Insomnia 03/31/2014  . Syncope 11/11/2012  . Autonomic postural hypotension 11/11/2012  . Parkinson's disease ([place]) 06/05/2012  . Restless leg syndrome 11/15/2011  . Primary hypothyroidism 09/11/2011  . Parkinsonism ([place]) 04/02/2011  . Depression, major, recurrent, in partial remission ([place]) 04/02/2011   Physical Therapy Progress Note  Dates of Reporting Period: 11/21/16 to 01-01-2017  Objective Reports of Subjective Statement: Patient states his walking feels much more "natural" and less labored  Objective Measurements: see Gcode above  Goal Update: NA  Plan: Continue with current plan working towards [place] are Required: Patient requires continued work on his balance to reduce fall risk (current DGI=18/28)   Rexanne Mano, PT 01/01/2017, 4:21 PM  [facility] [address] [place], [place], [address] Phone: [phone]   Fax:  [phone]  Name: Hiroki Wint MRN: [id number] Date of Birth: [birth year]

## 2016-12-27 NOTE — Therapy (Signed)
[facility] [address] [place], [place], [address] Phone: [phone]   Fax:  [phone]  Speech Language Pathology Treatment  Patient Details  Name: [name] MRN: [id number] Date of Birth: November 08, 1945 Referring Provider: Dr. Wells Guiles Tat   Encounter Date: 12/27/2016  End of Session - 12/27/16 1708    Visit Number  6    Number of Visits  17    Date for SLP Re-Evaluation  01/19/17    SLP Start Time  3785    SLP Stop Time   1145    SLP Time Calculation (min)  43 min    Activity Tolerance  Patient tolerated treatment well       Past Medical History:  Diagnosis Date  . Allergy   . Anemia   . Depression   . Parkinson's disease ([place])   . Parkinson's disease ([place]) 06/05/2012  . Raynaud's syndrome   . Thyroid disease     Past Surgical History:  Procedure Laterality Date  . APPENDECTOMY    . FRACTURE SURGERY     jaw  . HERNIA REPAIR    . MANDIBLE FRACTURE SURGERY    . ROTATOR CUFF REPAIR    . TONSILLECTOMY    . VASECTOMY    . WRIST FRACTURE SURGERY      There were no vitals filed for this visit.  Subjective Assessment - 12/27/16 1111    Subjective  Pt stated one thing he had done in each therapy.     Patient is accompained by:  Family member [name], wife   [name], wife   Currently in Pain?  No/denies            ADULT SLP TREATMENT - 12/27/16 1112      General Information   Behavior/Cognition  Alert;Cooperative;Pleasant mood      Treatment Provided   Treatment provided  Cognitive-Linquistic      Cognitive-Linquistic Treatment   Treatment focused on  Dysarthria    Skilled Treatment   SLP used loud /a/ to recalibrate volume with average of mid 90s dB, faded min cues to independent for open mouth and full breath. Structured speech tasks with sentence responses with average of low-70s dB with rare min A for volume. Simple conversation for 3-5 mintues x3 req'd occasional min cues for  volume, and using these cues averaged in lower 70s dB. When conversation involved incr'd cognitive/linguistic demand in simple conversation, or when conversation lasted longer than 3 minutes pt's volume decreased consistently. SLP encouargaed pt to practice loud voice at home with activities provided by SLP for 20-30 mintues, twice a day.      Assessment / Recommendations / Plan   Plan  Continue with current plan of care      Progression Toward Goals   Progression toward goals  Progressing toward goals         SLP Short Term Goals - 12/27/16 1705      SLP SHORT TERM GOAL #1   Title  Pt will average 90dB on loud "AH" with rare min A over 3 sessions.    Baseline  12/25/16,12-27-16    Time  2    Period  Weeks    Status  On-going      SLP SHORT TERM GOAL #2   Title  Pt will perform HEP for dysphagia and dysarthria with occasional min A over 3 sessions    Baseline  12/25/16,    Time  3    Period  Weeks  Status  On-going      SLP SHORT TERM GOAL #3   Title  Pt will follow diet modificiations/swallow precautions with occasional min A over 2 sessions    Baseline  12/25/16    Time  3    Period  Weeks    Status  On-going      SLP SHORT TERM GOAL #4   Title  Pt will average 72dB on structured speech tasks 10/12 sentences with occasional min A    Time  2    Period  Weeks    Status  On-going      SLP SHORT TERM GOAL #5   Title  Pt will utlize compensations for dysarthria/slur at sentence level 10/12 sentences with occasional min A    Time  2    Period  Weeks    Status  On-going       SLP Long Term Goals - 12/27/16 1710      SLP LONG TERM GOAL #1   Title  Pt will average 92dB on loud "AH" over 3 sessions with rare min A    Time  6    Period  Weeks    Status  On-going      SLP LONG TERM GOAL #2   Title  Pt will complete HEP for dysphagia and dysarthria with rare min A over 2 sessions    Time  6    Period  Weeks    Status  On-going      SLP LONG TERM GOAL #3   Title   Spouse/pt will report carryover of swallow precautions/diet modifications outside of therapy with occasional min cues from wife, over 3 sessions    Time  6    Period  Weeks    Status  On-going      SLP LONG TERM GOAL #4   Title  Pt will average 70 dB over 15 minute conversation with rare min A over 3 sessions    Time  6    Period  Weeks    Status  On-going      SLP LONG TERM GOAL #5   Title  Pt will be audible and intelligible in noisy environment during 15 minute conversation over 2 sessions.    Time  6    Period  Weeks    Status  On-going       Plan - 12/27/16 1708    Clinical Impression Statement  Pt demonstrated fairly good transfer of louder speech to short (approx 3 mintues) segments of simple conversation, with time >3 minutes or incr'd cognitive or linguistic load decreasing overall volume.  Sentence tasks were completed with adequate volume. Skiilled ST will be cont'd to maximize pt's intelligibliity and safety of swallow.     Speech Therapy Frequency  2x / week    Duration  -- 8 weeks   8 weeks   Treatment/Interventions  SLP instruction and feedback;Internal/external aids;Compensatory strategies;Environmental controls;Oral motor exercises;Patient/family education;Pharyngeal strengthening exercises;Functional tasks;Language facilitation    Potential to Achieve Goals  Good    Potential Considerations  Cooperation/participation level    SLP Home Exercise Plan  Loud "AH", dysphagia HEP; dysarthria HEP       Patient will benefit from skilled therapeutic intervention in order to improve the following deficits and impairments:   Dysarthria and anarthria  Dysphagia, oropharyngeal phase    Problem List Patient Active Problem List   Diagnosis Date Noted  . Lumbar back pain with radiculopathy affecting right lower extremity 09/19/2016  .  Elevated transaminase level 12/20/2015  . Chronic fatigue 12/02/2015  . Raynaud's phenomenon 04/16/2014  . Insomnia 03/31/2014  .  Syncope 11/11/2012  . Autonomic postural hypotension 11/11/2012  . Parkinson's disease ([place]) 06/05/2012  . Restless leg syndrome 11/15/2011  . Primary hypothyroidism 09/11/2011  . Parkinsonism ([place]) 04/02/2011  . Depression, major, recurrent, in partial remission ([place]) 04/02/2011    [facility], [place]  12/27/2016, 5:11 PM  [facility] [address] [place], [place], [address] Phone: [phone]   Fax:  [phone]   Name: [name] MRN: [id number] Date of Birth: [birth year]

## 2016-12-27 NOTE — Patient Instructions (Signed)
Memory Compensation Strategies  1. Use "WARM" strategy.  W= write it down  A= associate it  R= repeat it  M= make a mental note  2.   You can keep a Memory Notebook.  Use a 3-ring notebook with sections for the following: calendar, important names and phone numbers,  medications, doctors' names/phone numbers, lists/reminders, and a section to journal what you did  each day.   3.    Use a calendar to write appointments down.  4.    Write yourself a schedule for the day.  This can be placed on the calendar or in a separate section of the Memory Notebook.  Keeping a  regular schedule can help memory.  5.    Use medication organizer with sections for each day or morning/evening pills.  You may need help loading it  6.    Keep a basket, or pegboard by the door.  Place items that you need to take out with you in the basket or on the pegboard.  You may also want to  include a message board for reminders.  7.    Use sticky notes.  Place sticky notes with reminders in a place where the task is performed.  For example: " turn off the  stove" placed by the stove, "lock the door" placed on the door at eye level, " take your medications" on  the bathroom mirror or by the place where you normally take your medications.  8.    Use alarms/timers.  Use while cooking to remind yourself to check on food or as a reminder to take your medicine, or as a  reminder to make a call, or as a reminder to perform another task, etc.    Keeping Thinking Skills Sharp: 1. Jigsaw puzzles 2. Card/board games 3. Talking on the phone/social events 4. Lumosity.com 5. Online games 6. Word serches/crossword puzzles 7.  Logic puzzles 8. Aerobic exercise (stationary bike) 9. Eating balanced diet (fruits & veggies) 10. Drink water 11. Try something new--new recipe, hobby 12. Crafts 13. Do a variety of activities that are challenging 14. Add cognitive activities to walking/exercising (think of animal/food/city with  each letter of the alphabet, counting backwards, thinking of as many vegetables as you can, etc.).--Only do this  If safe (no freezing/falls).   

## 2017-01-01 ENCOUNTER — Ambulatory Visit: Payer: Medicare Other | Admitting: Occupational Therapy

## 2017-01-01 ENCOUNTER — Ambulatory Visit: Payer: Medicare Other | Admitting: Speech Pathology

## 2017-01-01 ENCOUNTER — Encounter: Payer: Self-pay | Admitting: Physical Therapy

## 2017-01-01 ENCOUNTER — Ambulatory Visit: Payer: Medicare Other | Admitting: Physical Therapy

## 2017-01-01 DIAGNOSIS — R29898 Other symptoms and signs involving the musculoskeletal system: Secondary | ICD-10-CM | POA: Diagnosis not present

## 2017-01-01 DIAGNOSIS — R471 Dysarthria and anarthria: Secondary | ICD-10-CM

## 2017-01-01 DIAGNOSIS — R2689 Other abnormalities of gait and mobility: Secondary | ICD-10-CM

## 2017-01-01 DIAGNOSIS — M6281 Muscle weakness (generalized): Secondary | ICD-10-CM | POA: Diagnosis not present

## 2017-01-01 DIAGNOSIS — R1312 Dysphagia, oropharyngeal phase: Secondary | ICD-10-CM

## 2017-01-01 DIAGNOSIS — R2681 Unsteadiness on feet: Secondary | ICD-10-CM

## 2017-01-01 DIAGNOSIS — R29818 Other symptoms and signs involving the nervous system: Secondary | ICD-10-CM | POA: Diagnosis not present

## 2017-01-01 DIAGNOSIS — R293 Abnormal posture: Secondary | ICD-10-CM | POA: Diagnosis not present

## 2017-01-01 DIAGNOSIS — R278 Other lack of coordination: Secondary | ICD-10-CM | POA: Diagnosis not present

## 2017-01-01 DIAGNOSIS — R41840 Attention and concentration deficit: Secondary | ICD-10-CM

## 2017-01-01 NOTE — Therapy (Signed)
[facility] [address] [place], [place], [address] Phone: [phone]   Fax:  [phone]  Speech Language Pathology Treatment  Patient Details  Name: [name] MRN: [id number] Date of Birth: [birth year] Referring Provider: Dr. Wells Guiles Tat   Encounter Date: 01/01/2017  End of Session - 01/01/17 1441    Visit Number  7    Number of Visits  17    Date for SLP Re-Evaluation  01/19/17    Authorization Type  may change re-eval date, as pt not scheudled to start ST for 2 weeks after eval    SLP Start Time  1105    SLP Stop Time   1147    SLP Time Calculation (min)  42 min    Activity Tolerance  Patient tolerated treatment well       Past Medical History:  Diagnosis Date  . Allergy   . Anemia   . Depression   . Parkinson's disease ([place])   . Parkinson's disease ([place]) 06/05/2012  . Raynaud's syndrome   . Thyroid disease     Past Surgical History:  Procedure Laterality Date  . APPENDECTOMY    . FRACTURE SURGERY     jaw  . HERNIA REPAIR    . MANDIBLE FRACTURE SURGERY    . ROTATOR CUFF REPAIR    . TONSILLECTOMY    . VASECTOMY    . WRIST FRACTURE SURGERY      There were no vitals filed for this visit.  Subjective Assessment - 01/01/17 1110    Subjective  Pt's wife reports daughter stating she can hear pt better on the phone.     Special Tests  spouse, [name]    Currently in Pain?  No/denies            ADULT SLP TREATMENT - 01/01/17 1105      General Information   Behavior/Cognition  Alert;Cooperative;Pleasant mood    Patient Positioning  Upright in chair      Treatment Provided   Treatment provided  Cognitive-Linquistic      Pain Assessment   Pain Assessment  No/denies pain      Cognitive-Linquistic Treatment   Treatment focused on  Dysarthria    Skilled Treatment  Pt greeted SLP in loud voice, however voice dropped as conversation continued. SLP used loud /a/ to recalibrate volume  with average of mid 90s dB, independent for open mouth and self-identified, corrected insufficent breath x1. Structured speech tasks with sentence responses with average of low-70s dB with rare min A for volume, mouth opening required with higher cognitive load. SLP facilitated 5 minutes simple conversation x4 with average of 72dB with occasional verbal cues for loudness. As noted during last session, pt required increased need for cuing as conversation progresses and with greater conversational complexity/mental load.       Assessment / Recommendations / Plan   Plan  Continue with current plan of care      Progression Toward Goals   Progression toward goals  Progressing toward goals       SLP Education - 01/01/17 1440    Education provided  Yes    Education Details  swallow more frequently to improve articulatory precision    Person(s) Educated  Patient;Spouse    Methods  Explanation;Verbal cues    Comprehension  Verbalized understanding;Returned demonstration       SLP Short Term Goals - 01/01/17 1443      SLP SHORT TERM GOAL #1   Title  Pt will average 90dB on loud "AH" with rare min A over 3 sessions.    Baseline  12/25/16,12-27-16 01/01/17    Time  2    Period  Weeks    Status  Achieved      SLP SHORT TERM GOAL #2   Title  Pt will perform HEP for dysphagia and dysarthria with occasional min A over 3 sessions    Baseline  12/25/16,    Time  2    Period  Weeks    Status  On-going      SLP SHORT TERM GOAL #3   Title  Pt will follow diet modificiations/swallow precautions with occasional min A over 2 sessions    Time  2    Period  Weeks    Status  On-going      SLP SHORT TERM GOAL #4   Title  Pt will average 72dB on structured speech tasks 10/12 sentences with occasional min A    Time  2    Period  Weeks    Status  On-going      SLP SHORT TERM GOAL #5   Title  Pt will utlize compensations for dysarthria/slur at sentence level 10/12 sentences with occasional min A     Time  2    Period  Weeks    Status  On-going       SLP Long Term Goals - 01/01/17 1446      SLP LONG TERM GOAL #1   Title  Pt will average 92dB on loud "AH" over 3 sessions with rare min A    Time  6    Period  Weeks    Status  On-going      SLP LONG TERM GOAL #2   Title  Pt will complete HEP for dysphagia and dysarthria with rare min A over 2 sessions    Time  6    Period  Weeks    Status  On-going      SLP LONG TERM GOAL #3   Title  Spouse/pt will report carryover of swallow precautions/diet modifications outside of therapy with occasional min cues from wife, over 3 sessions    Time  6    Period  Weeks    Status  On-going      SLP LONG TERM GOAL #4   Title  Pt will average 70 dB over 15 minute conversation with rare min A over 3 sessions    Time  6    Period  Weeks    Status  On-going      SLP LONG TERM GOAL #5   Title  Pt will be audible and intelligible in noisy environment during 15 minute conversation over 2 sessions.    Time  6    Period  Weeks    Status  On-going       Plan - 01/01/17 1441    Clinical Impression Statement  Pt demonstrated fairly good transfer of louder speech to short (approx 5 mintues) segments of simple conversation, requiring increased level of cuing to maintain volume >3 minutes or with incr'd cognitive or linguistic load.  Sentence tasks were completed with adequate volume. Skiilled ST will be cont'd to maximize pt's intelligibliity and safety of swallow.     Speech Therapy Frequency  2x / week    Treatment/Interventions  SLP instruction and feedback;Internal/external aids;Compensatory strategies;Environmental controls;Oral motor exercises;Patient/family education;Pharyngeal strengthening exercises;Functional tasks;Language facilitation    Potential to Achieve Goals  Good    Potential  Considerations  Cooperation/participation level    SLP Home Exercise Plan  Loud "AH", dysphagia HEP; dysarthria HEP    Consulted and Agree with Plan of Care   Patient;Family member/caregiver    Family Member Consulted  spouse [name]       Patient will benefit from skilled therapeutic intervention in order to improve the following deficits and impairments:   Dysarthria and anarthria  Dysphagia, oropharyngeal phase    Problem List Patient Active Problem List   Diagnosis Date Noted  . Lumbar back pain with radiculopathy affecting right lower extremity 09/19/2016  . Elevated transaminase level 12/20/2015  . Chronic fatigue 12/02/2015  . Raynaud's phenomenon 04/16/2014  . Insomnia 03/31/2014  . Syncope 11/11/2012  . Autonomic postural hypotension 11/11/2012  . Parkinson's disease ([place]) 06/05/2012  . Restless leg syndrome 11/15/2011  . Primary hypothyroidism 09/11/2011  . Parkinsonism ([place]) 04/02/2011  . Depression, major, recurrent, in partial remission ([place]) 04/02/2011   Deneise Lever, [place], [place] 01/01/2017, 2:47 PM  [facility] [address] [place], [place], [address] Phone: [phone]   Fax:  [phone]   Name: Tim Corriher MRN: [id number] Date of Birth: 11-06-1945

## 2017-01-01 NOTE — Therapy (Signed)
[facility] [address] [place], [place], [address] Phone: [phone]   Fax:  [phone]  Occupational Therapy Treatment  Patient Details  Name: [name] MRN: [id number] Date of Birth: [birth year] Referring Provider: Dr. Carles Collet   Encounter Date: 01/01/2017  OT End of Session - 01/01/17 1202    Visit Number  12    Number of Visits  17    Date for OT Re-Evaluation  01/19/17    Authorization Type  MCR Primary - G code needed, [employer] secondary    Authorization - Visit Number  12    Authorization - Number of Visits  20    OT Start Time  1015    OT Stop Time  1100    OT Time Calculation (min)  45 min    Activity Tolerance  Patient tolerated treatment well       Past Medical History:  Diagnosis Date  . Allergy   . Anemia   . Depression   . Parkinson's disease ([place])   . Parkinson's disease ([place]) 06/05/2012  . Raynaud's syndrome   . Thyroid disease     Past Surgical History:  Procedure Laterality Date  . APPENDECTOMY    . FRACTURE SURGERY     jaw  . HERNIA REPAIR    . MANDIBLE FRACTURE SURGERY    . ROTATOR CUFF REPAIR    . TONSILLECTOMY    . VASECTOMY    . WRIST FRACTURE SURGERY      There were no vitals filed for this visit.  Subjective Assessment - 01/01/17 1017    Subjective   I can put on my watch now    Pertinent History  PD since 2009, spinal stenosis, scoliosis, HOH, Raynauds, h/o falls with radius fx in 2011?, Rt ankle fx 2017, Rt rotator cuff surgery 2003    Limitations  fall risk, limited driving    Patient Stated Goals  put on my watch    Currently in Pain?  No/denies        TREATMENT:   UBE x 7 min. Level 3 maintaining > 45 RPM to increase heart rate and to maintain full movement.   Standing to perform stepping patterns for weight shifts while following directions by calling out words/instructions. Progressed to then calling out colors of the words, while following instructions  for cognition, divided attention, and reciprocal inhibition.  Tossing scarf while ambulating for divided attention and max difficulty maintaining normal walking speed with walking while tossing scarf at slower rate.  Standing to toss scarf up while calling out animals from each letter of the alphabet. Standing with side stepping to toss scarf to side (Rt, then Lt side) while calling out different foods from each letter of the alphabet. Pt required cues to maintain large amplitude movement and to divide attention/multi-task              P[facility]) - 01/01/17 0944    PWR! Up  x 10    PWR! Rock  x 10 reps each side    PWR! Twist  x 10 reps each side    PWR Step  x 10 reps each side    Comments  chair in front with UE support for standing PWR! Moves with verbal and visual cues            OT Short Term Goals - 12/25/16 1055      OT SHORT TERM GOAL #1   Title  Pt/family will be independent  with PD specific HEP including PWR! moves and coordination HEP     Time  4    Period  Weeks    Status  Achieved      OT SHORT TERM GOAL #2   Title  Pt will write at least 3 sentences with no significant decr in size and 95% legibility or greater     Time  4    Period  Weeks    Status  Achieved      OT SHORT TERM GOAL #3   Title  Pt will perform PPT #2 test in 12 sec. or under    Baseline  eval = 15.78 sec    Time  4    Period  Weeks    Status  Achieved 12/20/16 = 8.59 sec      OT SHORT TERM GOAL #4   Title  Pt will perform PPT #4 test in 22 sec. or under    Baseline  eval = 25.29 sec    Time  4    Period  Weeks    Status  Achieved 12/20/16 = 16 sec      OT SHORT TERM GOAL #5   Title  Pt will increase coordination Rt hand as evidenced by performing 9 hole peg test in 32 sec. or less    Baseline  eval = 36.90 sec    Time  4    Period  Weeks    Status  Achieved Rt = 18.25 sec      OT SHORT TERM GOAL #6   Title  Pt will increase RUE function as evidenced by performing 34  blocks or greater on Box & Blocks test    Baseline  eval = 30 (Lt = 48)     Time  4    Period  Weeks    Status  Achieved Rt = 49 sec        OT Long Term Goals - 12/27/16 1151      OT LONG TERM GOAL #1   Title  Pt will verbalize understanding of ways to prevent future PD related complications and appropriate community and national resources prn    Time  8    Period  Weeks    Status  On-going      OT LONG TERM GOAL #2   Title  Pt will verbalize understanding of adaptive strategies to incr ease with ADLS/IADLS    Time  8    Period  Weeks    Status  On-going      OT LONG TERM GOAL #3   Title  Pt will perform PPT #4 in 19 sec. or under    Baseline  eval = 25.29 sec    Time  8    Period  Weeks    Status  Achieved      OT LONG TERM GOAL #4   Title  Pt will improve functional coordination as evidenced by reducing time on 3 button test to 40 sec. or less    Baseline  59.01 sec. (standing with shirt on)     Time  8    Period  Weeks    Status  On-going      OT LONG TERM GOAL #5   Title  Pt will verbalize understanding of ways to keep thinking skills sharp    Time  8    Period  Weeks    Status  On-going      OT LONG TERM GOAL #6  Title  Pt to report using RUE as dominant UE for ADLS/IADLS 27% of the time    Time  8    Period  Weeks    Status  Achieved 12/25/16            Plan - 01/01/17 1216    Clinical Impression Statement  Pt progressing towards goals, however divided attention tasks remain challenging    Rehab Potential  Good    OT Frequency  2x / week    OT Duration  8 weeks    OT Treatment/Interventions  Self-care/ADL training;DME and/or AE instruction;Patient/family education;Splinting;Therapeutic exercises;Therapeutic activities;Neuromuscular education;Functional Mobility Training;Passive range of motion;Cognitive remediation/compensation;Visual/perceptual remediation/compensation;Manual Therapy    Plan  begin assessing remaining goals in prep for d/c, review  PWR! moves seated and supine    OT Home Exercise Plan  11/27/16: PWR! Seated, POP info, national websites. 11/29/16: PWR! Hands, tips for constipation. 12/05/16: PWR! Supine. 12/07/16: coordination HEP. 12/11/16: Writing strategies. 12/13/16: movement strategies for ADLS. 12/27/16: memory strategies/ways to keep thinking skills sharp    Consulted and Agree with Plan of Care  Patient;Family member/caregiver    Family Member Consulted  Wife       Patient will benefit from skilled therapeutic intervention in order to improve the following deficits and impairments:  Decreased coordination, Impaired flexibility, Improper body mechanics, Improper spinal/pelvic alignment, Decreased safety awareness, Decreased endurance, Decreased knowledge of precautions, Impaired tone, Impaired UE functional use, Pain, Decreased knowledge of use of DME, Decreased cognition, Decreased mobility, Decreased strength, Impaired perceived functional ability, Impaired vision/preception  Visit Diagnosis: Other symptoms and signs involving the nervous system  Other symptoms and signs involving the musculoskeletal system  Unsteadiness on feet  Attention and concentration deficit    Problem List Patient Active Problem List   Diagnosis Date Noted  . Lumbar back pain with radiculopathy affecting right lower extremity 09/19/2016  . Elevated transaminase level 12/20/2015  . Chronic fatigue 12/02/2015  . Raynaud's phenomenon 04/16/2014  . Insomnia 03/31/2014  . Syncope 11/11/2012  . Autonomic postural hypotension 11/11/2012  . Parkinson's disease ([place]) 06/05/2012  . Restless leg syndrome 11/15/2011  . Primary hypothyroidism 09/11/2011  . Parkinsonism ([place]) 04/02/2011  . Depression, major, recurrent, in partial remission ([place]) 04/02/2011    Carey Bullocks, OTR/L 01/01/2017, 12:18 PM  [facility] [address] [place], [place], [address] Phone:  [phone]   Fax:  [phone]  Name: [name] MRN: [id number] Date of Birth: [birth year]

## 2017-01-01 NOTE — Therapy (Signed)
[facility] [address] [place], [place], [address] Phone: [phone]   Fax:  [phone]  Physical Therapy Treatment  Patient Details  Name: [name] MRN: [id number] Date of Birth: [birth year] Referring Provider: Alonza Bogus, DO   Encounter Date: 01/01/2017  PT End of Session - 01/01/17 1222    Visit Number  11 next g-code on visit 20    Number of Visits  17    Date for PT Re-Evaluation  01/19/17    Authorization Type  Medicare, UHC    PT Start Time  0932    PT Stop Time  1012    PT Time Calculation (min)  40 min    Activity Tolerance  Patient tolerated treatment well    Behavior During Therapy  [facility] for tasks assessed/performed       Past Medical History:  Diagnosis Date  . Allergy   . Anemia   . Depression   . Parkinson's disease ([place])   . Parkinson's disease ([place]) 06/05/2012  . Raynaud's syndrome   . Thyroid disease     Past Surgical History:  Procedure Laterality Date  . APPENDECTOMY    . FRACTURE SURGERY     jaw  . HERNIA REPAIR    . MANDIBLE FRACTURE SURGERY    . ROTATOR CUFF REPAIR    . TONSILLECTOMY    . VASECTOMY    . WRIST FRACTURE SURGERY      There were no vitals filed for this visit.  Subjective Assessment - 01/01/17 0928    Subjective  Forgot foot-up brace today, but no problems with it.    Patient is accompained by:  Family member    Pertinent History  anemia, depression, Parkinson's disease dx 2009, R foot fracture [facility] for foot fracture in March 2018); R RTC repair, scoliosis (not a surgical candidate)    Patient Stated Goals  Pt's goals for therapy include exercises for back, help with more mobility in R hand; be able to stand up straighter and walk better (per wife)    Currently in Pain?  No/denies    Pain Onset  More than a month ago                      [facility] Adult PT Treatment/Exercise - 01/01/17 0001      Ambulation/Gait    Ambulation/Gait  Yes    Ambulation/Gait Assistance  5: Supervision    Ambulation/Gait Assistance Details  Cues for relaxed arm swing, increase step length; pt able to work to correct when he gets off sequence    Ambulation Distance (Feet)  600 Feet    Assistive device  None    Gait Pattern  Step-through pattern;Decreased trunk rotation;Trunk rotated posteriorly on right;Trunk flexed    Ambulation Surface  Indoor    Pre-Gait Activities  Stagger stance forward/back weightshifting with coordinated reciprocal arm swing (1 UE at counter), x 15 reps; then forward/back stepping and weightshifting at counter    Gait Comments  Pt not wearing his foot-up brace today.      High Level Balance   High Level Balance Comments  Coordinated UE/lower extremity dynamic activities:  exaggerrated step and stop with reciprocal arm swing; marching with reciprocal arm lifts (pt with significant difficulty sequencing UE and lower extremities with marching); sidestepping with coordinated arms, forward step and stop with coordinated UEs      Exercises   Other Exercises   forward/back weightshifting x 15 reps  each foot position in staggered stance; then forward/back step and weightshift x 10 reps each side      Lumbar Exercises: Stretches   Single Knee to Chest Stretch  2 reps;30 seconds    Double Knee to Chest Stretch  1 rep;30 seconds    Pelvic Tilt  -- 10 reps seated      Review of lumbar stretches given last visit.  Discussed trying to perform them daily, perhaps in the morning, for consistency of performance and to improve flexibility prior to starting day's activities.  [facility]) - 01/01/17 0944    PWR! Up  x 10    PWR! Rock  x 10 reps each side    PWR! Twist  x 10 reps each side modified at corner, reaching across body    PWR Step  x 10 reps each side    Comments  chair in front with UE support for standing PWR! Moves with verbal and visual cues            PT Short Term Goals - 12/25/16 0957       PT SHORT TERM GOAL #1   Title  Pt will perform HEP with wife's supervision, to address Parkinson-specific deficits.  TARGET 12/22/16    Time  5    Period  Weeks    Status  Achieved      PT SHORT TERM GOAL #2   Title  Pt will improve 5x sit<>stand to less than or equal to 15 seconds for improved efficiency and safety with transfers.    Baseline  11/5 15.37 sec    Time  5    Period  Weeks    Status  Partially Met      PT SHORT TERM GOAL #3   Title  Pt will improve TUG score to less than or equal to 13.5 seconds for decreased fall risk.    Baseline  11/5 7.97 sec    Time  5    Period  Weeks    Status  Achieved      PT SHORT TERM GOAL #4   Title  Pt will improve DGI score to at least 15/24 for decreased fall risk.    Baseline  11/5 18/24    Time  5    Period  Weeks    Status  Achieved      PT SHORT TERM GOAL #5   Title  Pt/wife will verbalize understanding of local Parkinson's disease resources.    Baseline  11/5 pt did not recall receiving resource information; wife thought they had    Time  5    Period  Weeks    Status  Achieved        PT Long Term Goals - 11/21/16 0543      PT LONG TERM GOAL #1   Title  Pt/wife will verbalize understanding of fall prevention in the home environment.  TARGET 01/19/17    Time  9    Period  Weeks    Status  New    Target Date  01/19/17      PT LONG TERM GOAL #2   Title  Pt will perform sit<>stand from surfaces lower than 18", minimal UE support, at least 6 of 10 reps, modified independently, for improved car and low surface transfers.    Time  9    Period  Weeks    Status  New    Target Date  01/19/17      PT LONG TERM  GOAL #3   Title  Pt will improve TUG cognitive score to less tahn or equal to 15 seconds for decreased fall risk.    Time  9    Period  Weeks    Status  New    Target Date  01/19/17      PT LONG TERM GOAL #4   Title  Pt will improve DGI score to at least 19/24 for decreased fall risk.    Time  9    Period   Weeks    Status  New    Target Date  01/19/17      PT LONG TERM GOAL #5   Title  Pt will verbalize plans for continued community fitness upon d/c from PT.    Time  9    Period  Weeks    Status  New    Target Date  01/19/17            Plan - 01/01/17 1223    Clinical Impression Statement  No increase in back pain reported with standing and gait activities this visit.  Pt return demo understanding of lumbar stretching exercises from last visit.  Pt has significant difficulty with higher level balance activities with coordinated arm swing, but overall appears to have improved awareness with reciprocal arm swing with gait.    Rehab Potential  Good    Clinical Impairments Affecting Rehab Potential  scoliosis (not a surgical candidate) with chronic back pain    PT Frequency  2x / week 1x/wk for 1 week, then 2x/wk for 8 weeks    PT Duration  Other (comment) total POC = 9 weeks    PT Treatment/Interventions  ADLs/Self Care Home Management;Gait training;DME Instruction;Functional mobility training;Therapeutic activities;Therapeutic exercise;Balance training;Neuromuscular re-education;Patient/family education    PT Next Visit Plan  Review standing PWR! Moves versus Modified quadruped PWR! Moves; continue dynamic balance and gait activities with coordinated arms    Consulted and Agree with Plan of Care  Patient;Family member/caregiver    Family Member Consulted  wife       Patient will benefit from skilled therapeutic intervention in order to improve the following deficits and impairments:  Abnormal gait, Decreased balance, Decreased mobility, Decreased strength, Difficulty walking, Impaired tone, Impaired flexibility, Postural dysfunction, Pain(PT will monitor pain, but will not specifically address as a goal, as pain is longstanding due to scoliosis)  Visit Diagnosis: Other symptoms and signs involving the nervous system  Unsteadiness on feet  Other abnormalities of gait and  mobility     Problem List Patient Active Problem List   Diagnosis Date Noted  . Lumbar back pain with radiculopathy affecting right lower extremity 09/19/2016  . Elevated transaminase level 12/20/2015  . Chronic fatigue 12/02/2015  . Raynaud's phenomenon 04/16/2014  . Insomnia 03/31/2014  . Syncope 11/11/2012  . Autonomic postural hypotension 11/11/2012  . Parkinson's disease ([place]) 06/05/2012  . Restless leg syndrome 11/15/2011  . Primary hypothyroidism 09/11/2011  . Parkinsonism ([place]) 04/02/2011  . Depression, major, recurrent, in partial remission ([place]) 04/02/2011    MARRIOTT,AMY W. 01/01/2017, 12:27 PM Frazier Butt., PT   [facility] [address] [place], [place], [address] Phone: [phone]   Fax:  [phone]  Name: [name] MRN: [id number] Date of Birth: 1946-01-02

## 2017-01-03 ENCOUNTER — Encounter: Payer: Self-pay | Admitting: Physical Therapy

## 2017-01-03 ENCOUNTER — Ambulatory Visit: Payer: Medicare Other

## 2017-01-03 ENCOUNTER — Encounter: Payer: Medicare Other | Admitting: Occupational Therapy

## 2017-01-03 ENCOUNTER — Ambulatory Visit: Payer: Medicare Other | Admitting: Physical Therapy

## 2017-01-03 VITALS — BP 94/55 | HR 71

## 2017-01-03 DIAGNOSIS — R29898 Other symptoms and signs involving the musculoskeletal system: Secondary | ICD-10-CM | POA: Diagnosis not present

## 2017-01-03 DIAGNOSIS — R2689 Other abnormalities of gait and mobility: Secondary | ICD-10-CM

## 2017-01-03 DIAGNOSIS — R293 Abnormal posture: Secondary | ICD-10-CM | POA: Diagnosis not present

## 2017-01-03 DIAGNOSIS — M6281 Muscle weakness (generalized): Secondary | ICD-10-CM | POA: Diagnosis not present

## 2017-01-03 DIAGNOSIS — R471 Dysarthria and anarthria: Secondary | ICD-10-CM

## 2017-01-03 DIAGNOSIS — R29818 Other symptoms and signs involving the nervous system: Secondary | ICD-10-CM

## 2017-01-03 DIAGNOSIS — R2681 Unsteadiness on feet: Secondary | ICD-10-CM

## 2017-01-03 DIAGNOSIS — R278 Other lack of coordination: Secondary | ICD-10-CM | POA: Diagnosis not present

## 2017-01-03 DIAGNOSIS — R1312 Dysphagia, oropharyngeal phase: Secondary | ICD-10-CM

## 2017-01-03 NOTE — Therapy (Signed)
[facility] [address] [place], [place], [address] Phone: [phone]   Fax:  [phone]  Physical Therapy Treatment  Patient Details  Name: [name] MRN: [id number] Date of Birth: [birth year] Referring Provider: Alonza Bogus, DO   Encounter Date: 01/03/2017  PT End of Session - 01/03/17 1242    Visit Number  12 next g-code on visit 20    Number of Visits  17    Date for PT Re-Evaluation  01/19/17    Authorization Type  Medicare, UHC    PT Start Time  [phone]    PT Stop Time  1014    PT Time Calculation (min)  40 min    Activity Tolerance  Patient tolerated treatment well    Behavior During Therapy  [facility] for tasks assessed/performed       Past Medical History:  Diagnosis Date  . Allergy   . Anemia   . Depression   . Parkinson's disease ([place])   . Parkinson's disease ([place]) 06/05/2012  . Raynaud's syndrome   . Thyroid disease     Past Surgical History:  Procedure Laterality Date  . APPENDECTOMY    . FRACTURE SURGERY     jaw  . HERNIA REPAIR    . MANDIBLE FRACTURE SURGERY    . ROTATOR CUFF REPAIR    . TONSILLECTOMY    . VASECTOMY    . WRIST FRACTURE SURGERY      Vitals:   01/03/17 0937 01/03/17 0951  BP: (!) 98/56 (!) 94/55  Pulse: 65 71    Subjective Assessment - 01/03/17 0937    Subjective  Had a bad day yesterday-with blood pressure issues-he blacked out and fell.  Pt reports pain in R hip and L wrist.    Patient is accompained by:  Family member    Pertinent History  anemia, depression, Parkinson's disease dx 2009, R foot fracture ([facility] for foot fracture in March 2018); R RTC repair, scoliosis (not a surgical candidate)    Patient Stated Goals  Pt's goals for therapy include exercises for back, help with more mobility in R hand; be able to stand up straighter and walk better (per wife)    Currently in Pain?  Yes    Pain Score  3     Pain Location  Hip    Pain  Orientation  Right    Pain Descriptors / Indicators  Aching    Pain Type  Acute pain from fall yesterday    Pain Onset  More than a month ago    Aggravating Factors   fall on that hip yesterday    Pain Relieving Factors  not sure                      [facility] Adult PT Treatment/Exercise - 01/03/17 0943      Ambulation/Gait   Ambulation/Gait  Yes    Ambulation/Gait Assistance  5: Supervision    Ambulation/Gait Assistance Details  Cues for relaxed arm swing    Ambulation Distance (Feet)  1000 Feet then 800 ft    Assistive device  None    Gait Pattern  Step-through pattern;Decreased trunk rotation;Trunk rotated posteriorly on right;Trunk flexed    Ambulation Surface  Level;Indoor    Gait Comments  Gait with conversation tasks (pt tends to get off sequence with arm swing with conversation task)-able to self-correct sequence      High Level Balance   High Level Balance Activities  Backward walking Forward/back walking 20 ft, 3 reps    High Level Balance Comments  Coordinated UE/lower extremity dynamic activities:  exaggerrated step and stop with reciprocal arm swing; marching with reciprocal arm lifts (pt able to coordinate UE movements with marching >50% of time today); sidestepping with coordinated arms, forward step and stop with coordinated UEs, 20 ft, 4 reps each      Self-Care   Self-Care  Other Self-Care Comments    Other Self-Care Comments   Discussed postural hypotension-ways to be safe and to monitor position changes, exercises in sitting prior to standing including seated ankle pumps, marching, LAQs.  Discussed slowed position changes; documenting this information in journal to discuss with Dr.Tat at next visit (or before if needed)             PT Education - 01/03/17 1242    Education provided  Yes    Education Details  Discussed postural hypotension (in relation to pt's blacking out yesterday)-see self care notes    Person(s) Educated  Patient    Methods   Explanation;Verbal cues    Comprehension  Verbalized understanding       PT Short Term Goals - 12/25/16 0957      PT SHORT TERM GOAL #1   Title  Pt will perform HEP with wife's supervision, to address Parkinson-specific deficits.  TARGET 12/22/16    Time  5    Period  Weeks    Status  Achieved      PT SHORT TERM GOAL #2   Title  Pt will improve 5x sit<>stand to less than or equal to 15 seconds for improved efficiency and safety with transfers.    Baseline  11/5 15.37 sec    Time  5    Period  Weeks    Status  Partially Met      PT SHORT TERM GOAL #3   Title  Pt will improve TUG score to less than or equal to 13.5 seconds for decreased fall risk.    Baseline  11/5 7.97 sec    Time  5    Period  Weeks    Status  Achieved      PT SHORT TERM GOAL #4   Title  Pt will improve DGI score to at least 15/24 for decreased fall risk.    Baseline  11/5 18/24    Time  5    Period  Weeks    Status  Achieved      PT SHORT TERM GOAL #5   Title  Pt/wife will verbalize understanding of local Parkinson's disease resources.    Baseline  11/5 pt did not recall receiving resource information; wife thought they had    Time  5    Period  Weeks    Status  Achieved        PT Long Term Goals - 11/21/16 0543      PT LONG TERM GOAL #1   Title  Pt/wife will verbalize understanding of fall prevention in the home environment.  TARGET 01/19/17    Time  9    Period  Weeks    Status  New    Target Date  01/19/17      PT LONG TERM GOAL #2   Title  Pt will perform sit<>stand from surfaces lower than 18", minimal UE support, at least 6 of 10 reps, modified independently, for improved car and low surface transfers.    Time  9    Period  Weeks  Status  New    Target Date  01/19/17      PT LONG TERM GOAL #3   Title  Pt will improve TUG cognitive score to less tahn or equal to 15 seconds for decreased fall risk.    Time  9    Period  Weeks    Status  New    Target Date  01/19/17      PT LONG  TERM GOAL #4   Title  Pt will improve DGI score to at least 19/24 for decreased fall risk.    Time  9    Period  Weeks    Status  New    Target Date  01/19/17      PT LONG TERM GOAL #5   Title  Pt will verbalize plans for continued community fitness upon d/c from PT.    Time  9    Period  Weeks    Status  New    Target Date  01/19/17            Plan - 01/03/17 1243    Clinical Impression Statement  Pt with slightly improved coordination of reciprocal arm movements with dynamic gait/balance activities this visit.  With walking, he is able to sequence reciprocal arm swing well, except when conversation added to gait, and he gets off sequence, but is able to self-correct.  Pt had episode of blacking out yesterday with a fall-per wife, likely due to blood pressure changes.  Pt appeared to have low BP today, but is asymptomatic.    Rehab Potential  Good    Clinical Impairments Affecting Rehab Potential  scoliosis (not a surgical candidate) with chronic back pain    PT Frequency  2x / week 1x/wk for 1 week, then 2x/wk for 8 weeks    PT Duration  Other (comment) total POC = 9 weeks    PT Treatment/Interventions  ADLs/Self Care Home Management;Gait training;DME Instruction;Functional mobility training;Therapeutic activities;Therapeutic exercise;Balance training;Neuromuscular re-education;Patient/family education    PT Next Visit Plan  Review standing PWR! Moves versus Modified quadruped PWR! Moves; continue dynamic balance and gait activities with coordinated arms; begin discussing community fitness    Consulted and Agree with Plan of Care  Patient;Family member/caregiver    Family Member Consulted  wife       Patient will benefit from skilled therapeutic intervention in order to improve the following deficits and impairments:  Abnormal gait, Decreased balance, Decreased mobility, Decreased strength, Difficulty walking, Impaired tone, Impaired flexibility, Postural dysfunction, Pain(PT will  monitor pain, but will not specifically address as a goal, as pain is longstanding due to scoliosis)  Visit Diagnosis: Other abnormalities of gait and mobility  Unsteadiness on feet  Other symptoms and signs involving the nervous system     Problem List Patient Active Problem List   Diagnosis Date Noted  . Lumbar back pain with radiculopathy affecting right lower extremity 09/19/2016  . Elevated transaminase level 12/20/2015  . Chronic fatigue 12/02/2015  . Raynaud's phenomenon 04/16/2014  . Insomnia 03/31/2014  . Syncope 11/11/2012  . Autonomic postural hypotension 11/11/2012  . Parkinson's disease ([place]) 06/05/2012  . Restless leg syndrome 11/15/2011  . Primary hypothyroidism 09/11/2011  . Parkinsonism ([place]) 04/02/2011  . Depression, major, recurrent, in partial remission ([place]) 04/02/2011    Jaidyn Usery W. 01/03/2017, 12:46 PM Frazier Butt., PT  [facility] [address] [place], [place], [address] Phone: [phone]   Fax:  [phone]  Name: Marlo Goodrich MRN:  [id number] Date of Birth: 1946-01-13

## 2017-01-04 NOTE — Therapy (Signed)
[facility] [address] [place], [place], [address] Phone: [phone]   Fax:  [phone]  Speech Language Pathology Treatment  Patient Details  Name: [name] MRN: [id number] Date of Birth: [birth year] Referring Provider: Dr. Wells Guiles Tat   Encounter Date: 01/03/2017  End of Session - 01/04/17 1743    Visit Number  8    Number of Visits  17    Date for SLP Re-Evaluation  01/19/17    SLP Start Time  1104    SLP Stop Time   1145    SLP Time Calculation (min)  41 min    Activity Tolerance  Patient tolerated treatment well       Past Medical History:  Diagnosis Date  . Allergy   . Anemia   . Depression   . Parkinson's disease ([place])   . Parkinson's disease ([place]) 06/05/2012  . Raynaud's syndrome   . Thyroid disease     Past Surgical History:  Procedure Laterality Date  . APPENDECTOMY    . FRACTURE SURGERY     jaw  . HERNIA REPAIR    . MANDIBLE FRACTURE SURGERY    . ROTATOR CUFF REPAIR    . TONSILLECTOMY    . VASECTOMY    . WRIST FRACTURE SURGERY      There were no vitals filed for this visit.  Subjective Assessment - 01/03/17 1111    Subjective  Pt with fall yesterday likely due to low BP. Pt to march in place prior to arising from chair.    Currently in Pain?  Yes    Pain Score  2     Pain Location  Hip    Pain Orientation  Right    Pain Descriptors / Indicators  Aching    Pain Type  Acute pain    Pain Onset  More than a month ago    Pain Frequency  Intermittent    Aggravating Factors   exercise/stretch    Pain Relieving Factors  unsure            ADULT SLP TREATMENT - 01/04/17 0001      General Information   Behavior/Cognition  Alert;Cooperative;Pleasant mood      Treatment Provided   Treatment provided  Dysphagia      Dysphagia Treatment   Respiratory Status  Room air    Oral Cavity - Dentition  Adequate natural dentition    Patient observed directly with PO's  Yes     Liquids provided via  Cup    Pharyngeal Phase Signs & Symptoms  Delayed throat clear;Immediate throat clear;Delayed cough;Complaints of residue delay wiht liquids suspected    Other treatment/comments  Initially, throat clears rarely during boluses of liquid/solid, as POs continued, throat clears more frequently, after liquids.SLPencouraged pt to swallow hard consistently especially wth liquids and short pause time prior to swallows of liquid were also encouraged. Pt with complaints of residue consistently, cleared with double swallows water. SLP suggested a "cough log". Pt cont'd to cough approx every 45 seconds x4 after finishing POs. SLP suggested to pt/wife that pt not sensing residual saliva still being produced after eating. SLP told pt to swallow for 5 mintues after POs.      Assessment / Recommendations / Plan   Plan  Continue with current plan of care      Dysphagia Recommendations   Diet recommendations  Dysphagia 3 (mechanical soft);Regular diet as tolerated      Progression Toward Goals  Progression toward goals  Progressing toward goals       SLP Education - 01/04/17 1742    Education provided  Yes    Education Details  modifications to make with meals/POs see "skilled interventions"    Person(s) Educated  Patient    Methods  Explanation;Demonstration    Comprehension  Verbalized understanding;Returned demonstration;Need further instruction       SLP Short Term Goals - 01/04/17 1745      SLP SHORT TERM GOAL #1   Title  Pt will average 90dB on loud "AH" with rare min A over 3 sessions.    Status  Achieved      SLP SHORT TERM GOAL #2   Title  Pt will perform HEP for dysphagia and dysarthria with occasional min A over 3 sessions    Baseline  12/25/16,    Time  1    Period  Weeks    Status  On-going      SLP SHORT TERM GOAL #3   Title  Pt will follow diet modificiations/swallow precautions with occasional min A over 2 sessions    Baseline  01-03-17    Time  1     Period  Weeks    Status  On-going      SLP SHORT TERM GOAL #4   Title  Pt will average 72dB on structured speech tasks 10/12 sentences with occasional min A    Time  1    Period  Weeks    Status  On-going      SLP SHORT TERM GOAL #5   Title  Pt will utlize compensations for dysarthria/slur at sentence level 10/12 sentences with occasional min A    Time  1    Period  Weeks    Status  On-going       SLP Long Term Goals - 01/04/17 1745      SLP LONG TERM GOAL #1   Title  Pt will average 92dB on loud "AH" over 3 sessions with rare min A    Time  5    Period  Weeks    Status  On-going      SLP LONG TERM GOAL #2   Title  Pt will complete HEP for dysphagia and dysarthria with rare min A over 2 sessions    Time  5    Period  Weeks    Status  On-going      SLP LONG TERM GOAL #3   Title  Spouse/pt will report carryover of swallow precautions/diet modifications outside of therapy with occasional min cues from wife, over 3 sessions    Baseline  01-03-17    Time  5    Period  Weeks    Status  On-going      SLP LONG TERM GOAL #4   Title  Pt will average 70 dB over 15 minute conversation with rare min A over 3 sessions    Time  5    Period  Weeks    Status  On-going      SLP LONG TERM GOAL #5   Title  Pt will be audible and intelligible in noisy environment during 15 minute conversation over 2 sessions.    Time  5    Period  Weeks    Status  On-going       Plan - 01/04/17 1743    Clinical Impression Statement  Pt followed swallow precautions with supervision cues however SLP made some recommendations for modifications (See skilled  interventions). Spouse cont to report good carryover at home with reduced s/s of aspiration at home. Continue skilled ST to maximize intelligibility and safety of swallow.     Speech Therapy Frequency  2x / week    Treatment/Interventions  SLP instruction and feedback;Internal/external aids;Compensatory strategies;Environmental controls;Oral motor  exercises;Patient/family education;Pharyngeal strengthening exercises;Functional tasks;Language facilitation    Potential to Achieve Goals  Good    Potential Considerations  Cooperation/participation level    SLP Home Exercise Plan  Loud "AH", dysphagia HEP; dysarthria HEP       Patient will benefit from skilled therapeutic intervention in order to improve the following deficits and impairments:   Dysarthria and anarthria  Dysphagia, oropharyngeal phase    Problem List Patient Active Problem List   Diagnosis Date Noted  . Lumbar back pain with radiculopathy affecting right lower extremity 09/19/2016  . Elevated transaminase level 12/20/2015  . Chronic fatigue 12/02/2015  . Raynaud's phenomenon 04/16/2014  . Insomnia 03/31/2014  . Syncope 11/11/2012  . Autonomic postural hypotension 11/11/2012  . Parkinson's disease ([place]) 06/05/2012  . Restless leg syndrome 11/15/2011  . Primary hypothyroidism 09/11/2011  . Parkinsonism ([place]) 04/02/2011  . Depression, major, recurrent, in partial remission ([place]) 04/02/2011    [facility] ,[place], [place]  01/04/2017, 5:46 PM  [facility] [address] [place], [place], [address] Phone: [phone]   Fax:  [phone]   Name: [name] MRN: [id number] Date of Birth: May 20, 1945

## 2017-01-08 ENCOUNTER — Ambulatory Visit: Payer: Medicare Other | Admitting: Occupational Therapy

## 2017-01-08 ENCOUNTER — Ambulatory Visit: Payer: Medicare Other | Admitting: Physical Therapy

## 2017-01-08 ENCOUNTER — Encounter: Payer: Self-pay | Admitting: Physical Therapy

## 2017-01-08 ENCOUNTER — Ambulatory Visit: Payer: Medicare Other | Admitting: Speech Pathology

## 2017-01-08 DIAGNOSIS — R293 Abnormal posture: Secondary | ICD-10-CM

## 2017-01-08 DIAGNOSIS — R278 Other lack of coordination: Secondary | ICD-10-CM

## 2017-01-08 DIAGNOSIS — R29898 Other symptoms and signs involving the musculoskeletal system: Secondary | ICD-10-CM

## 2017-01-08 DIAGNOSIS — R29818 Other symptoms and signs involving the nervous system: Secondary | ICD-10-CM | POA: Diagnosis not present

## 2017-01-08 DIAGNOSIS — R1312 Dysphagia, oropharyngeal phase: Secondary | ICD-10-CM

## 2017-01-08 DIAGNOSIS — R2681 Unsteadiness on feet: Secondary | ICD-10-CM

## 2017-01-08 DIAGNOSIS — R2689 Other abnormalities of gait and mobility: Secondary | ICD-10-CM | POA: Diagnosis not present

## 2017-01-08 DIAGNOSIS — M6281 Muscle weakness (generalized): Secondary | ICD-10-CM | POA: Diagnosis not present

## 2017-01-08 NOTE — Patient Instructions (Signed)

## 2017-01-08 NOTE — Therapy (Signed)
[facility] [address] [place], [place], [address] Phone: [phone]   Fax:  [phone]  Physical Therapy Treatment  Patient Details  Name: [name] MRN: [id number] Date of Birth: [birth year] Referring Provider: Alonza Bogus, DO   Encounter Date: 01/08/2017  PT End of Session - 01/08/17 1022    Visit Number  13 next g-code on visit 20    Number of Visits  17    Date for PT Re-Evaluation  01/19/17    Authorization Type  Medicare, UHC    PT Start Time  0929    PT Stop Time  1014    PT Time Calculation (min)  45 min    Activity Tolerance  Patient tolerated treatment well    Behavior During Therapy  [facility] for tasks assessed/performed       Past Medical History:  Diagnosis Date  . Allergy   . Anemia   . Depression   . Parkinson's disease ([place])   . Parkinson's disease ([place]) 06/05/2012  . Raynaud's syndrome   . Thyroid disease     Past Surgical History:  Procedure Laterality Date  . APPENDECTOMY    . FRACTURE SURGERY     jaw  . HERNIA REPAIR    . MANDIBLE FRACTURE SURGERY    . ROTATOR CUFF REPAIR    . TONSILLECTOMY    . VASECTOMY    . WRIST FRACTURE SURGERY      There were no vitals filed for this visit.  Subjective Assessment - 01/08/17 0932    Subjective  No other falls, no more bad days with blood pressure.    Patient is accompained by:  Family member    Pertinent History  anemia, depression, Parkinson's disease dx 2009, R foot fracture [facility] PT for foot fracture in March 2018); R RTC repair, scoliosis (not a surgical candidate)    Patient Stated Goals  Pt's goals for therapy include exercises for back, help with more mobility in R hand; be able to stand up straighter and walk better (per wife)    Currently in Pain?  Yes    Pain Score  4     Pain Location  Hip    Pain Orientation  Right    Pain Descriptors / Indicators  Aching    Pain Onset  More than a month ago    Aggravating Factors   lying on that side     Pain Relieving Factors  exercise/stretching helps                      [facility] Adult PT Treatment/Exercise - 01/08/17 0001      Self-Care   Self-Care  Other Self-Care Comments    Other Self-Care Comments   Discussed options for continued community fitness upon d/c-provided information on PWR! MOves community classes; discussed optimal fitness program for people with PD upon d/c-PWR! Moves/PT, OT, speech HEP; walking; aerobic activity      Knee/Hip Exercises: Aerobic   Stepper  Level 2.5, Seat 19, 6 minutes, cues for increased intensity >70-80; pt rates work effort level as 7-8/10;  2 minute cool down at 60 RPM     Discussed how to rate intensity of exercise and optimal level of 7-8/10 as work effort level.  Discussed use of intervals to increase intensity.   PWR [facility]) - 01/08/17 0934    PWR! exercises  Moves in [place];Moves in standing Modified quadruped at counter    PWR! Up  x 20    PWR! Rock  x 20    PWR! Twist  x 20 pt prefers to not hold onto counter-needs cues    PWR! Step  x 20    Comments  Modified quadruped at counter-pt prefers to perform PWR! Twist without UE support at counter, therapist provides cues to intensity and technique    PWR! Up  x 10 cues to increase knee/hip flexion with squat    PWR! Rock  x 10 reps each    PWR Step  x 10 reps each side    Comments  PWR! Moves in standing:  Performed near counter-mod visual and verbal cues for technique and intensity          PT Education - 01/08/17 1021    Education provided  Yes    Education Details  Community fitness options, including PWR! Moves classes; optimal PD fitness program upon d/c from PT    Person(s) Educated  Spouse    Methods  Explanation;Demonstration;Handout;Verbal cues    Comprehension  Verbalized understanding;Returned demonstration;Need further instruction       PT Short Term Goals - 12/25/16 0957      PT SHORT TERM GOAL #1   Title   Pt will perform HEP with wife's supervision, to address Parkinson-specific deficits.  TARGET 12/22/16    Time  5    Period  Weeks    Status  Achieved      PT SHORT TERM GOAL #2   Title  Pt will improve 5x sit<>stand to less than or equal to 15 seconds for improved efficiency and safety with transfers.    Baseline  11/5 15.37 sec    Time  5    Period  Weeks    Status  Partially Met      PT SHORT TERM GOAL #3   Title  Pt will improve TUG score to less than or equal to 13.5 seconds for decreased fall risk.    Baseline  11/5 7.97 sec    Time  5    Period  Weeks    Status  Achieved      PT SHORT TERM GOAL #4   Title  Pt will improve DGI score to at least 15/24 for decreased fall risk.    Baseline  11/5 18/24    Time  5    Period  Weeks    Status  Achieved      PT SHORT TERM GOAL #5   Title  Pt/wife will verbalize understanding of local Parkinson's disease resources.    Baseline  11/5 pt did not recall receiving resource information; wife thought they had    Time  5    Period  Weeks    Status  Achieved        PT Long Term Goals - 11/21/16 0543      PT LONG TERM GOAL #1   Title  Pt/wife will verbalize understanding of fall prevention in the home environment.  TARGET 01/19/17    Time  9    Period  Weeks    Status  New    Target Date  01/19/17      PT LONG TERM GOAL #2   Title  Pt will perform sit<>stand from surfaces lower than 18", minimal UE support, at least 6 of 10 reps, modified independently, for improved car and low surface transfers.    Time  9    Period  Weeks    Status  New  Target Date  01/19/17      PT LONG TERM GOAL #3   Title  Pt will improve TUG cognitive score to less tahn or equal to 15 seconds for decreased fall risk.    Time  9    Period  Weeks    Status  New    Target Date  01/19/17      PT LONG TERM GOAL #4   Title  Pt will improve DGI score to at least 19/24 for decreased fall risk.    Time  9    Period  Weeks    Status  New    Target  Date  01/19/17      PT LONG TERM GOAL #5   Title  Pt will verbalize plans for continued community fitness upon d/c from PT.    Time  9    Period  Weeks    Status  New    Target Date  01/19/17            Plan - 01/08/17 1025    Clinical Impression Statement  Skilled PT session focused on PWR! Moves in modified quadruped as well as standing PWR! MOves; also addressed options for continued community fitness, including aerobic activities and PWR! Moves exercise classes.  Pt will continue to benefit from skilled PT to address balance, posture, and gait.    Rehab Potential  Good    Clinical Impairments Affecting Rehab Potential  scoliosis (not a surgical candidate) with chronic back pain    PT Frequency  2x / week 1x/wk for 1 week, then 2x/wk for 8 weeks    PT Duration  Other (comment) total POC = 9 weeks    PT Treatment/Interventions  ADLs/Self Care Home Management;Gait training;DME Instruction;Functional mobility training;Therapeutic activities;Therapeutic exercise;Balance training;Neuromuscular re-education;Patient/family education    PT Next Visit Plan  Continue standing PWR! Moves (question adding to HEP due to pt already having number of exercises); dynamic gait and balance activities    Consulted and Agree with Plan of Care  Patient;Family member/caregiver    Family Member Consulted  wife       Patient will benefit from skilled therapeutic intervention in order to improve the following deficits and impairments:  Abnormal gait, Decreased balance, Decreased mobility, Decreased strength, Difficulty walking, Impaired tone, Impaired flexibility, Postural dysfunction, Pain(PT will monitor pain, but will not specifically address as a goal, as pain is longstanding due to scoliosis)  Visit Diagnosis: Unsteadiness on feet  Other symptoms and signs involving the nervous system  Abnormal posture     Problem List Patient Active Problem List   Diagnosis Date Noted  . Lumbar back pain  with radiculopathy affecting right lower extremity 09/19/2016  . Elevated transaminase level 12/20/2015  . Chronic fatigue 12/02/2015  . Raynaud's phenomenon 04/16/2014  . Insomnia 03/31/2014  . Syncope 11/11/2012  . Autonomic postural hypotension 11/11/2012  . Parkinson's disease ([place]) 06/05/2012  . Restless leg syndrome 11/15/2011  . Primary hypothyroidism 09/11/2011  . Parkinsonism ([place]) 04/02/2011  . Depression, major, recurrent, in partial remission ([place]) 04/02/2011    Cleave Ternes W. 01/08/2017, 10:29 AM Frazier Butt., PT  [facility] [address] [place], [place], [address] Phone: [phone]   Fax:  [phone]  Name: Crewe Heathman MRN: [id number] Date of Birth: 11-Nov-1945

## 2017-01-08 NOTE — Therapy (Signed)
[facility] [address] [place], [place], [address] Phone: [phone]   Fax:  [phone]  Occupational Therapy Treatment  Patient Details  Name: [name] MRN: [id number] Date of Birth: [birth year] Referring Provider: Dr. Carles Collet   Encounter Date: 01/08/2017  OT End of Session - 01/08/17 1115    Visit Number  13    Number of Visits  17    Date for OT Re-Evaluation  01/19/17    Authorization Type  MCR Primary - G code needed, UHC secondary    Authorization - Visit Number  13    Authorization - Number of Visits  20    OT Start Time  1022    OT Stop Time  1100    OT Time Calculation (min)  38 min    Activity Tolerance  Patient tolerated treatment well       Past Medical History:  Diagnosis Date  . Allergy   . Anemia   . Depression   . Parkinson's disease ([place])   . Parkinson's disease ([place]) 06/05/2012  . Raynaud's syndrome   . Thyroid disease     Past Surgical History:  Procedure Laterality Date  . APPENDECTOMY    . FRACTURE SURGERY     jaw  . HERNIA REPAIR    . MANDIBLE FRACTURE SURGERY    . ROTATOR CUFF REPAIR    . TONSILLECTOMY    . VASECTOMY    . WRIST FRACTURE SURGERY      There were no vitals filed for this visit.  Subjective Assessment - 01/08/17 1025    Subjective   I blacked out and fell last Tuesday from low BP    Pertinent History  PD since 2009, spinal stenosis, scoliosis, HOH, Raynauds, h/o falls with radius fx in 2011?, Rt ankle fx 2017, Rt rotator cuff surgery 2003    Limitations  fall risk, limited driving    Patient Stated Goals  put on my watch    Currently in Pain?  Yes    Pain Score  4     Pain Location  Hip    Pain Orientation  Right    Pain Descriptors / Indicators  Aching    Pain Type  Acute pain    Pain Onset  More than a month ago    Pain Frequency  Intermittent    Aggravating Factors   lying on that side    Pain Relieving Factors  exercise                    OT Treatments/Exercises (OP) - 01/08/17 0001      ADLs   ADL Comments  Assessed progress towards LTG's in prep for d/c next week. Verbally reviewed community resources, future complications with PD, and strategies for cutting, opening containers/jars, and donning/doffing jacket. Also discussed fall prevention related to orthostatic hypotension.     Neurological Re-education Exercises   Other Exercises 1  Reviewed PWR! Seated and PWR! Supine basic 4 x 10 reps each. Pt required min cues for PWR! Rock seated and supine, and PWR! Step supine for sequencing.                OT Short Term Goals - 12/25/16 1055      OT SHORT TERM GOAL #1   Title  Pt/family will be independent with PD specific HEP including PWR! moves and coordination HEP     Time  4    Period  Weeks  Status  Achieved      OT SHORT TERM GOAL #2   Title  Pt will write at least 3 sentences with no significant decr in size and 95% legibility or greater     Time  4    Period  Weeks    Status  Achieved      OT SHORT TERM GOAL #3   Title  Pt will perform PPT #2 test in 12 sec. or under    Baseline  eval = 15.78 sec    Time  4    Period  Weeks    Status  Achieved 12/20/16 = 8.59 sec      OT SHORT TERM GOAL #4   Title  Pt will perform PPT #4 test in 22 sec. or under    Baseline  eval = 25.29 sec    Time  4    Period  Weeks    Status  Achieved 12/20/16 = 16 sec      OT SHORT TERM GOAL #5   Title  Pt will increase coordination Rt hand as evidenced by performing 9 hole peg test in 32 sec. or less    Baseline  eval = 36.90 sec    Time  4    Period  Weeks    Status  Achieved Rt = 18.25 sec      OT SHORT TERM GOAL #6   Title  Pt will increase RUE function as evidenced by performing 34 blocks or greater on Box & Blocks test    Baseline  eval = 30 (Lt = 48)     Time  4    Period  Weeks    Status  Achieved Rt = 49 sec        OT Long Term Goals - 01/08/17 1116      OT LONG TERM  GOAL #1   Title  Pt will verbalize understanding of ways to prevent future PD related complications and appropriate community and national resources prn    Time  8    Period  Weeks    Status  On-going      OT LONG TERM GOAL #2   Title  Pt will verbalize understanding of adaptive strategies to incr ease with ADLS/IADLS    Time  8    Period  Weeks    Status  Achieved      OT LONG TERM GOAL #3   Title  Pt will perform PPT #4 in 19 sec. or under    Baseline  eval = 25.29 sec    Time  8    Period  Weeks    Status  Achieved      OT LONG TERM GOAL #4   Title  Pt will improve functional coordination as evidenced by reducing time on 3 button test to 40 sec. or less    Baseline  59.01 sec. (standing with shirt on)     Time  8    Period  Weeks    Status  On-going      OT LONG TERM GOAL #5   Title  Pt will verbalize understanding of ways to keep thinking skills sharp    Time  8    Period  Weeks    Status  Achieved      OT LONG TERM GOAL #6   Title  Pt to report using RUE as dominant UE for ADLS/IADLS 75% of the time    Time  8      Period  Weeks    Status  Achieved 12/25/16            Plan - 01/08/17 1116    Clinical Impression Statement  Pt progressing and meeting remaining goals. Pt continues to need reinforcement for PWR! Rock in seated and supine. However, wife present during education and can help cue pt at home prn.     Rehab Potential  Good    OT Frequency  2x / week    OT Duration  8 weeks    OT Treatment/Interventions  Self-care/ADL training;DME and/or AE instruction;Patient/family education;Splinting;Therapeutic exercises;Therapeutic activities;Neuromuscular education;Functional Mobility Training;Passive range of motion;Cognitive remediation/compensation;Visual/perceptual remediation/compensation;Manual Therapy    Plan  show pt/wife "Aware in Care" kit and discuss, review PWR! Quadraped modified prn, continue standing/reaching tasks with cognitive component. Anticipate  d/c following week    OT Home Exercise Plan  11/27/16: PWR! Seated, POP info, national websites. 11/29/16: PWR! Hands, tips for constipation. 12/05/16: PWR! Supine. 12/07/16: coordination HEP. 12/11/16: Writing strategies. 12/13/16: movement strategies for ADLS. 12/27/16: memory strategies/ways to keep thinking skills sharp    Consulted and Agree with Plan of Care  Patient;Family member/caregiver    Family Member Consulted  Wife       Patient will benefit from skilled therapeutic intervention in order to improve the following deficits and impairments:  Decreased coordination, Impaired flexibility, Improper body mechanics, Improper spinal/pelvic alignment, Decreased safety awareness, Decreased endurance, Decreased knowledge of precautions, Impaired tone, Impaired UE functional use, Pain, Decreased knowledge of use of DME, Decreased cognition, Decreased mobility, Decreased strength, Impaired perceived functional ability, Impaired vision/preception  Visit Diagnosis: Other symptoms and signs involving the nervous system  Abnormal posture  Other symptoms and signs involving the musculoskeletal system  Other lack of coordination    Problem List Patient Active Problem List   Diagnosis Date Noted  . Lumbar back pain with radiculopathy affecting right lower extremity 09/19/2016  . Elevated transaminase level 12/20/2015  . Chronic fatigue 12/02/2015  . Raynaud's phenomenon 04/16/2014  . Insomnia 03/31/2014  . Syncope 11/11/2012  . Autonomic postural hypotension 11/11/2012  . Parkinson's disease ([place]) 06/05/2012  . Restless leg syndrome 11/15/2011  . Primary hypothyroidism 09/11/2011  . Parkinsonism ([place]) 04/02/2011  . Depression, major, recurrent, in partial remission ([place]) 04/02/2011    Carey Bullocks, OTR/L 01/08/2017, 11:19 AM  [facility] [address] [place], [place], [address] Phone: [phone]   Fax:   [phone]  Name: [name] MRN: [id number] Date of Birth: 1945/06/20

## 2017-01-09 ENCOUNTER — Ambulatory Visit: Payer: Medicare Other | Admitting: Speech Pathology

## 2017-01-09 ENCOUNTER — Encounter: Payer: Self-pay | Admitting: Speech Pathology

## 2017-01-09 ENCOUNTER — Ambulatory Visit: Payer: Medicare Other | Admitting: Physical Therapy

## 2017-01-09 ENCOUNTER — Encounter: Payer: Self-pay | Admitting: Physical Therapy

## 2017-01-09 ENCOUNTER — Ambulatory Visit: Payer: Medicare Other | Admitting: Occupational Therapy

## 2017-01-09 DIAGNOSIS — R41840 Attention and concentration deficit: Secondary | ICD-10-CM

## 2017-01-09 DIAGNOSIS — R1312 Dysphagia, oropharyngeal phase: Secondary | ICD-10-CM

## 2017-01-09 DIAGNOSIS — R278 Other lack of coordination: Secondary | ICD-10-CM

## 2017-01-09 DIAGNOSIS — R2681 Unsteadiness on feet: Secondary | ICD-10-CM

## 2017-01-09 DIAGNOSIS — M6281 Muscle weakness (generalized): Secondary | ICD-10-CM | POA: Diagnosis not present

## 2017-01-09 DIAGNOSIS — R29818 Other symptoms and signs involving the nervous system: Secondary | ICD-10-CM

## 2017-01-09 DIAGNOSIS — R293 Abnormal posture: Secondary | ICD-10-CM | POA: Diagnosis not present

## 2017-01-09 DIAGNOSIS — R2689 Other abnormalities of gait and mobility: Secondary | ICD-10-CM | POA: Diagnosis not present

## 2017-01-09 DIAGNOSIS — R29898 Other symptoms and signs involving the musculoskeletal system: Secondary | ICD-10-CM | POA: Diagnosis not present

## 2017-01-09 DIAGNOSIS — R471 Dysarthria and anarthria: Secondary | ICD-10-CM

## 2017-01-09 NOTE — Therapy (Signed)
[facility] [address] [place], [place], [address] Phone: [phone]   Fax:  [phone]  Speech Language Pathology Treatment  Patient Details  Name: [name] MRN: [id number] Date of Birth: 1945/05/28 Referring Provider: Dr. Wells Guiles Tat   Encounter Date: 01/09/2017  End of Session - 01/09/17 1154    Visit Number  9    Number of Visits  17    Date for SLP Re-Evaluation  01/19/17    SLP Start Time  1103    SLP Stop Time   1144    SLP Time Calculation (min)  41 min    Activity Tolerance  Patient tolerated treatment well       Past Medical History:  Diagnosis Date  . Allergy   . Anemia   . Depression   . Parkinson's disease ([place])   . Parkinson's disease ([place]) 06/05/2012  . Raynaud's syndrome   . Thyroid disease     Past Surgical History:  Procedure Laterality Date  . APPENDECTOMY    . FRACTURE SURGERY     jaw  . HERNIA REPAIR    . MANDIBLE FRACTURE SURGERY    . ROTATOR CUFF REPAIR    . TONSILLECTOMY    . VASECTOMY    . WRIST FRACTURE SURGERY      There were no vitals filed for this visit.  Subjective Assessment - 01/09/17 1109    Subjective  "My daughter says she can hear me better over the phone"    Patient is accompained by:  -- spouse, [name]    Currently in Pain?  Yes    Pain Score  4     Pain Location  Hip    Pain Orientation  Right    Pain Descriptors / Indicators  Aching    Pain Type  Chronic pain    Pain Onset  More than a month ago    Pain Frequency  Intermittent    Pain Relieving Factors  pt getting a shot in his hip for pain             ADULT SLP TREATMENT - 01/09/17 1117      General Information   Behavior/Cognition  Alert;Cooperative;Pleasant mood    Patient Positioning  Upright in bed      Treatment Provided   Treatment provided  Dysphagia      Dysphagia Treatment   Temperature Spikes Noted  No    Respiratory Status  Room air    Oral Cavity - Dentition   Adequate natural dentition    Treatment Methods  Therapeutic exercise;Compensation strategy training;Patient/caregiver education    Type of PO's observed  Regular;Thin liquids    Feeding  Able to feed self    Liquids provided via  Cup    Pharyngeal Phase Signs & Symptoms  Delayed throat clear;Immediate cough;Delayed cough;Complaints of residue    Other treatment/comments  Pt followed swallow precautions in non-distracting environment with mod I. When conversation between [place] and spouse began, pt reqiured min A to complete 3 swallows before liquid sip. Made a tent table top reminder for swallow precautions for Thanksgiving. Pt required rare min A for dysphagia HEP      Cognitive-Linquistic Treatment   Treatment focused on  Dysarthria    Skilled Treatment  Loud /a/ to recalibrate volume average 93dB with mod I. Structured speech tasks averaged 70dB with usual min A for volume, due to cognitive load. Simple conversation with average of low 70's for short utterances with  occasional min A.       Assessment / Recommendations / Plan   Plan  Continue with current plan of care      Dysphagia Recommendations   Diet recommendations  Dysphagia 3 (mechanical soft);Thin liquid    Liquids provided via  Cup    Supervision  Intermittent supervision to cue for compensatory strategies    Compensations  Slow rate;Small sips/bites;Multiple dry swallows after each bite/sip;Effortful swallow;Follow solids with liquid      Progression Toward Goals   Progression toward goals  Progressing toward goals       SLP Education - 01/09/17 1150    Education provided  Yes    Education Details  Rationale for doing HEP for dysarthria and dysphagia twice daily, instead of once    Person(s) Educated  Patient;Spouse    Methods  Explanation;Verbal cues    Comprehension  Verbalized understanding       SLP Short Term Goals - 01/09/17 1153      SLP SHORT TERM GOAL #1   Title  Pt will average 90dB on loud "AH" with rare  min A over 3 sessions.    Status  Achieved      SLP SHORT TERM GOAL #2   Title  Pt will perform HEP for dysphagia and dysarthria with occasional min A over 3 sessions    Time  1    Period  Weeks    Status  Partially Met 2/3      SLP SHORT TERM GOAL #3   Title  Pt will follow diet modificiations/swallow precautions with occasional min A over 2 sessions    Baseline  01-03-17, 01-08-17    Status  Achieved      SLP SHORT TERM GOAL #4   Status  Partially Met       SLP Long Term Goals - 01/09/17 1154      SLP LONG TERM GOAL #1   Title  Pt will average 92dB on loud "AH" over 3 sessions with rare min A    Baseline  01/09/17;     Time  5    Period  Weeks    Status  On-going      SLP LONG TERM GOAL #2   Title  Pt will complete HEP for dysphagia and dysarthria with rare min A over 2 sessions    Baseline  01/09/17    Time  5    Period  Weeks    Status  On-going      SLP LONG TERM GOAL #3   Title  Spouse/pt will report carryover of swallow precautions/diet modifications outside of therapy with occasional min cues from wife, over 3 sessions    Baseline  01-03-17 01/09/17    Time  5    Period  Weeks    Status  On-going      SLP LONG TERM GOAL #4   Title  Pt will average 70 dB over 15 minute conversation with rare min A over 3 sessions    Time  5    Period  Weeks    Status  On-going      SLP LONG TERM GOAL #5   Title  Pt will be audible and intelligible in noisy environment during 15 minute conversation over 2 sessions.    Time  5    Period  Weeks    Status  On-going       Plan - 01/09/17 1151    Clinical Impression Statement  Pt required min  A to carryover volume in simple conversation with short utterances. He required min A for over articulation. Pt followed swallow precautions with supervision cues without distractions. When distraction present, pt required min to mod A. Made tent table reminder for Thanksgiving meal. Spouse reports good carryover of precautions at home  with her verbal cues. Continue skilled ST to maximize intelligibility and safety of swallow.     Speech Therapy Frequency  2x / week    Treatment/Interventions  SLP instruction and feedback;Internal/external aids;Compensatory strategies;Environmental controls;Oral motor exercises;Patient/family education;Pharyngeal strengthening exercises;Functional tasks;Language facilitation    Potential to Achieve Goals  Good    Potential Considerations  Cooperation/participation level    SLP Home Exercise Plan  Loud "AH", dysphagia HEP; dysarthria HEP    Consulted and Agree with Plan of Care  Patient;Family member/caregiver    Family Member Consulted  spouse [name]       Patient will benefit from skilled therapeutic intervention in order to improve the following deficits and impairments:   Dysarthria and anarthria  Dysphagia, oropharyngeal phase    Problem List Patient Active Problem List   Diagnosis Date Noted  . Lumbar back pain with radiculopathy affecting right lower extremity 09/19/2016  . Elevated transaminase level 12/20/2015  . Chronic fatigue 12/02/2015  . Raynaud's phenomenon 04/16/2014  . Insomnia 03/31/2014  . Syncope 11/11/2012  . Autonomic postural hypotension 11/11/2012  . Parkinson's disease ([facility]) 06/05/2012  . Restless leg syndrome 11/15/2011  . Primary hypothyroidism 09/11/2011  . Parkinsonism ([place]) 04/02/2011  . Depression, major, recurrent, in partial remission ([place]) 04/02/2011    Devaunte Gasparini, Annye Rusk MS, CCC-SLP 01/09/2017, 11:56 AM  [facility] [address] [place], [place], [address] Phone: [phone]   Fax:  [phone]   Name: Constantin Hillery MRN: [id number] Date of Birth: Nov 21, 1945

## 2017-01-09 NOTE — Therapy (Signed)
[facility] [address] [place], [place], [address] Phone: [phone]   Fax:  [phone]  Physical Therapy Treatment  Patient Details  Name: [name] MRN: [id number] Date of Birth: 07/24/1945 Referring Provider: Alonza Bogus, DO   Encounter Date: 01/09/2017  PT End of Session - 01/09/17 1928    Visit Number  14 next g-code on visit 20    Number of Visits  17    Date for PT Re-Evaluation  01/19/17    Authorization Type  Medicare, [employer]    PT Start Time  0929    PT Stop Time  1016    PT Time Calculation (min)  47 min    Activity Tolerance  Patient tolerated treatment well    Behavior During Therapy  [facility] for tasks assessed/performed       Past Medical History:  Diagnosis Date  . Allergy   . Anemia   . Depression   . Parkinson's disease ([facility])   . Parkinson's disease ([place]) 06/05/2012  . Raynaud's syndrome   . Thyroid disease     Past Surgical History:  Procedure Laterality Date  . APPENDECTOMY    . FRACTURE SURGERY     jaw  . HERNIA REPAIR    . MANDIBLE FRACTURE SURGERY    . ROTATOR CUFF REPAIR    . TONSILLECTOMY    . VASECTOMY    . WRIST FRACTURE SURGERY      There were no vitals filed for this visit.  Subjective Assessment - 01/09/17 0937    Subjective  No changes since last visit yesterday.  Per wife, still really need to work on posture and arm swing with gait-pt agrees.    Patient is accompained by:  Family member    Pertinent History  anemia, depression, Parkinson's disease dx 2009, R foot fracture [facility] PT for foot fracture in March 2018); R RTC repair, scoliosis (not a surgical candidate)    Patient Stated Goals  Pt's goals for therapy include exercises for back, help with more mobility in R hand; be able to stand up straighter and walk better (per wife)    Currently in Pain?  Yes    Pain Score  3     Pain Location  Hip    Pain Orientation  Right    Pain  Descriptors / Indicators  Aching    Pain Onset  More than a month ago    Aggravating Factors   lying on the side    Pain Relieving Factors  exercise                      [facility] Adult PT Treatment/Exercise - 01/09/17 0940      Transfers   Transfers  Sit to Stand;Stand to Sit    Sit to Stand  6: Modified independent (Device/Increase time);Without upper extremity assist;From chair/3-in-1    Stand to Sit  6: Modified independent (Device/Increase time);Without upper extremity assist;To chair/3-in-1      Ambulation/Gait   Ambulation/Gait  Yes    Ambulation/Gait Assistance  5: Supervision;6: Modified independent (Device/Increase time)    Ambulation/Gait Assistance Details  Began indoors with facilitation of reciprocal arm swing with walking poles x 230 ft, then outdoor gait    Ambulation Distance (Feet)  1500 Feet    Assistive device  None    Gait Pattern  Step-through pattern;Decreased trunk rotation;Trunk rotated posteriorly on right;Trunk flexed    Ambulation Surface  Level;Indoor  Gait Comments  Gait outdoors on sidewalk, pavement, inclines/declines, no device with conversation tasks.  No LOB; when pt gets off sequence with arm swing, he is able to self-correct to return to reciprocal arm swing.      High Level Balance   High Level Balance Comments  Stagger stance forward/back weightshifting with added arm swing facilitated with bilateral walking poles.  In corner, trunk rotation activities for arm swing-reaching high across body, reaching shoulder height across body, then diagonal reaching high and low, x 10 reps each exercise, each side.  Cues for widened BOS to allow for improved weightshifting.        [facility]) - 01/09/17 0941    PWR! exercises  Moves in sitting;Moves in standing    PWR! Up  2 sets x 10 reps    Comments  Explained relevance of standing PWR! Up posture in functional activities    PWR! Sit to Stand  2 sets x 10 reps    PWR! Up  x 10, 2 sets     Comments  Explained relevance of PWR! Up in sitting to daily activities          PT Education - 01/09/17 1930    Education provided  Yes    Education Details  PWR! Up in standing for postural awareness added to HEP    Person(s) Educated  Patient;Spouse    Methods  Explanation;Demonstration;Handout    Comprehension  Verbalized understanding       PT Short Term Goals - 12/25/16 0957      PT SHORT TERM GOAL #1   Title  Pt will perform HEP with wife's supervision, to address Parkinson-specific deficits.  TARGET 12/22/16    Time  5    Period  Weeks    Status  Achieved      PT SHORT TERM GOAL #2   Title  Pt will improve 5x sit<>stand to less than or equal to 15 seconds for improved efficiency and safety with transfers.    Baseline  11/5 15.37 sec    Time  5    Period  Weeks    Status  Partially Met      PT SHORT TERM GOAL #3   Title  Pt will improve TUG score to less than or equal to 13.5 seconds for decreased fall risk.    Baseline  11/5 7.97 sec    Time  5    Period  Weeks    Status  Achieved      PT SHORT TERM GOAL #4   Title  Pt will improve DGI score to at least 15/24 for decreased fall risk.    Baseline  11/5 18/24    Time  5    Period  Weeks    Status  Achieved      PT SHORT TERM GOAL #5   Title  Pt/wife will verbalize understanding of local Parkinson's disease resources.    Baseline  11/5 pt did not recall receiving resource information; wife thought they had    Time  5    Period  Weeks    Status  Achieved        PT Long Term Goals - 11/21/16 0543      PT LONG TERM GOAL #1   Title  Pt/wife will verbalize understanding of fall prevention in the home environment.  TARGET 01/19/17    Time  9    Period  Weeks    Status  New    Target  Date  01/19/17      PT LONG TERM GOAL #2   Title  Pt will perform sit<>stand from surfaces lower than 18", minimal UE support, at least 6 of 10 reps, modified independently, for improved car and low surface transfers.     Time  9    Period  Weeks    Status  New    Target Date  01/19/17      PT LONG TERM GOAL #3   Title  Pt will improve TUG cognitive score to less tahn or equal to 15 seconds for decreased fall risk.    Time  9    Period  Weeks    Status  New    Target Date  01/19/17      PT LONG TERM GOAL #4   Title  Pt will improve DGI score to at least 19/24 for decreased fall risk.    Time  9    Period  Weeks    Status  New    Target Date  01/19/17      PT LONG TERM GOAL #5   Title  Pt will verbalize plans for continued community fitness upon d/c from PT.    Time  9    Period  Weeks    Status  New    Target Date  01/19/17            Plan - 01/09/17 1928    Clinical Impression Statement  Skilled PT session focused on various exercises to address upright posture-in sitting, standing and with sit<>stand-and how they can directly be tied to functional activities.  Addressed arm swing and gait activities, with pt able to self-correct arm swing with gait on outdoor surfaces.  Pt is on track to meet LTGs, with plans for d/c next week.    Rehab Potential  Good    Clinical Impairments Affecting Rehab Potential  scoliosis (not a surgical candidate) with chronic back pain    PT Frequency  2x / week 1x/wk for 1 week, then 2x/wk for 8 weeks    PT Duration  Other (comment) total POC = 9 weeks    PT Treatment/Interventions  ADLs/Self Care Home Management;Gait training;DME Instruction;Functional mobility training;Therapeutic activities;Therapeutic exercise;Balance training;Neuromuscular re-education;Patient/family education    PT Next Visit Plan  Review standing PWR! Up exercise added to HEP; begin checking LTGs and plan for d/c next week    Consulted and Agree with Plan of Care  Patient;Family member/caregiver    Family Member Consulted  wife       Patient will benefit from skilled therapeutic intervention in order to improve the following deficits and impairments:  Abnormal gait, Decreased balance,  Decreased mobility, Decreased strength, Difficulty walking, Impaired tone, Impaired flexibility, Postural dysfunction, Pain(PT will monitor pain, but will not specifically address as a goal, as pain is longstanding due to scoliosis)  Visit Diagnosis: Abnormal posture  Other abnormalities of gait and mobility     Problem List Patient Active Problem List   Diagnosis Date Noted  . Lumbar back pain with radiculopathy affecting right lower extremity 09/19/2016  . Elevated transaminase level 12/20/2015  . Chronic fatigue 12/02/2015  . Raynaud's phenomenon 04/16/2014  . Insomnia 03/31/2014  . Syncope 11/11/2012  . Autonomic postural hypotension 11/11/2012  . Parkinson's disease ([place]) 06/05/2012  . Restless leg syndrome 11/15/2011  . Primary hypothyroidism 09/11/2011  . Parkinsonism ([place]) 04/02/2011  . Depression, major, recurrent, in partial remission ([place]) 04/02/2011    Naidelyn Parrella W. 01/09/2017, 7:32 PM  [facility] [address] [place], [place], [address] Phone: [phone]   Fax:  [phone]  Name: [name] MRN: [id number] Date of Birth: 24-Apr-1945

## 2017-01-09 NOTE — Therapy (Signed)
[facility] [address] [place], [place], [address] Phone: [phone]   Fax:  [phone]  Occupational Therapy Treatment  Patient Details  Name: Aemon Koeller MRN: [id number] Date of Birth: [birth year] Referring Provider: Dr. Carles Collet   Encounter Date: 01/09/2017  OT End of Session - 01/09/17 1306    Visit Number  14    Number of Visits  17    Date for OT Re-Evaluation  01/19/17    Authorization Type  MCR Primary - G code needed, UHC secondary    Authorization - Visit Number  14    Authorization - Number of Visits  20    OT Start Time  1015    OT Stop Time  1100    OT Time Calculation (min)  45 min    Activity Tolerance  Patient tolerated treatment well    Behavior During Therapy  [facility] for tasks assessed/performed       Past Medical History:  Diagnosis Date  . Allergy   . Anemia   . Depression   . Parkinson's disease ([place])   . Parkinson's disease ([place]) 06/05/2012  . Raynaud's syndrome   . Thyroid disease     Past Surgical History:  Procedure Laterality Date  . APPENDECTOMY    . FRACTURE SURGERY     jaw  . HERNIA REPAIR    . MANDIBLE FRACTURE SURGERY    . ROTATOR CUFF REPAIR    . TONSILLECTOMY    . VASECTOMY    . WRIST FRACTURE SURGERY      There were no vitals filed for this visit.  Subjective Assessment - 01/09/17 1259    Subjective   P.T. went over the PWR! ex's in standing and over the counter, so I think I'm good with those    Patient is accompained by:  Family member    Pertinent History  PD since 2009, spinal stenosis, scoliosis, HOH, Raynauds, h/o falls with radius fx in 2011?, Rt ankle fx 2017, Rt rotator cuff surgery 2003    Limitations  fall risk, limited driving    Patient Stated Goals  put on my watch    Currently in Pain?  Yes    Pain Score  4     Pain Location  Hip    Pain Orientation  Right    Pain Descriptors / Indicators  Aching    Pain Type  Chronic pain    Pain Onset   More than a month ago    Pain Frequency  Intermittent    Aggravating Factors   lying on the side    Pain Relieving Factors  injection to hip for pain                   OT Treatments/Exercises (OP) - 01/09/17 0001      ADLs   ADL Comments  Pt/family shown "Aware in Care" kit and discussed for PD management and for greater carryover of PD management and medications in the event of hospitalization      Neurological Re-education Exercises   Other Exercises 1  Continues PWR! Step in standing in functional context while flipping cards over and placing to sides/diagonals bilaterally for large amplitude movements, wt. shifts, trunk rotation, and full elbow ext and supination. Added cognitive component for dual tasking by contiually adding together last 2 cards     Other Exercises 2  Dual motor tasking with ambulation at normal gait speed while tossing scarf (alternating hands) at different  speed. Pt had extreme difficulty initially but improved w/ repetition. Pt then performed trunk rotation with contralateral reaching in standing to throw scarves while calling out things that begin with the same letter of the color of the scarf              OT Education - 01/09/17 1305    Education provided  Yes    Education Details  Aware in Care kit, examples of dual tasking including dual motor tasks, and/or motor and cognitive tasks    Person(s) Educated  Patient;Spouse    Methods  Explanation;Demonstration    Comprehension  Verbalized understanding       OT Short Term Goals - 12/25/16 1055      OT SHORT TERM GOAL #1   Title  Pt/family will be independent with PD specific HEP including PWR! moves and coordination HEP     Time  4    Period  Weeks    Status  Achieved      OT SHORT TERM GOAL #2   Title  Pt will write at least 3 sentences with no significant decr in size and 95% legibility or greater     Time  4    Period  Weeks    Status  Achieved      OT SHORT TERM GOAL #3    Title  Pt will perform PPT #2 test in 12 sec. or under    Baseline  eval = 15.78 sec    Time  4    Period  Weeks    Status  Achieved 12/20/16 = 8.59 sec      OT SHORT TERM GOAL #4   Title  Pt will perform PPT #4 test in 22 sec. or under    Baseline  eval = 25.29 sec    Time  4    Period  Weeks    Status  Achieved 12/20/16 = 16 sec      OT SHORT TERM GOAL #5   Title  Pt will increase coordination Rt hand as evidenced by performing 9 hole peg test in 32 sec. or less    Baseline  eval = 36.90 sec    Time  4    Period  Weeks    Status  Achieved Rt = 18.25 sec      OT SHORT TERM GOAL #6   Title  Pt will increase RUE function as evidenced by performing 34 blocks or greater on Box & Blocks test    Baseline  eval = 30 (Lt = 48)     Time  4    Period  Weeks    Status  Achieved Rt = 49 sec        OT Long Term Goals - 01/08/17 1116      OT LONG TERM GOAL #1   Title  Pt will verbalize understanding of ways to prevent future PD related complications and appropriate community and national resources prn    Time  8    Period  Weeks    Status  On-going      OT LONG TERM GOAL #2   Title  Pt will verbalize understanding of adaptive strategies to incr ease with ADLS/IADLS    Time  8    Period  Weeks    Status  Achieved      OT LONG TERM GOAL #3   Title  Pt will perform PPT #4 in 19 sec. or under    Baseline  eval =  25.29 sec    Time  8    Period  Weeks    Status  Achieved      OT LONG TERM GOAL #4   Title  Pt will improve functional coordination as evidenced by reducing time on 3 button test to 40 sec. or less    Baseline  59.01 sec. (standing with shirt on)     Time  8    Period  Weeks    Status  On-going      OT LONG TERM GOAL #5   Title  Pt will verbalize understanding of ways to keep thinking skills sharp    Time  8    Period  Weeks    Status  Achieved      OT LONG TERM GOAL #6   Title  Pt to report using RUE as dominant UE for ADLS/IADLS 29% of the time    Time  8     Period  Weeks    Status  Achieved 12/25/16            Plan - 01/09/17 1308    Clinical Impression Statement  Pt progressing towards discharge. Pt most challenged with dual tasksing    Rehab Potential  Good    OT Frequency  2x / week    OT Duration  8 weeks    OT Treatment/Interventions  Self-care/ADL training;DME and/or AE instruction;Patient/family education;Splinting;Therapeutic exercises;Therapeutic activities;Neuromuscular education;Functional Mobility Training;Passive range of motion;Cognitive remediation/compensation;Visual/perceptual remediation/compensation;Manual Therapy    Plan  Begin prepping for d/c and check remaining goals. Pt to be discharged from O.T. next week    OT Home Exercise Plan  11/27/16: PWR! Seated, POP info, national websites. 11/29/16: PWR! Hands, tips for constipation. 12/05/16: PWR! Supine. 12/07/16: coordination HEP. 12/11/16: Writing strategies. 12/13/16: movement strategies for ADLS. 12/27/16: memory strategies/ways to keep thinking skills sharp    Consulted and Agree with Plan of Care  Patient;Family member/caregiver    Family Member Consulted  Wife       Patient will benefit from skilled therapeutic intervention in order to improve the following deficits and impairments:  Decreased coordination, Impaired flexibility, Improper body mechanics, Improper spinal/pelvic alignment, Decreased safety awareness, Decreased endurance, Decreased knowledge of precautions, Impaired tone, Impaired UE functional use, Pain, Decreased knowledge of use of DME, Decreased cognition, Decreased mobility, Decreased strength, Impaired perceived functional ability, Impaired vision/preception  Visit Diagnosis: Other symptoms and signs involving the nervous system  Other symptoms and signs involving the musculoskeletal system  Other lack of coordination  Unsteadiness on feet  Attention and concentration deficit    Problem List Patient Active Problem List   Diagnosis Date  Noted  . Lumbar back pain with radiculopathy affecting right lower extremity 09/19/2016  . Elevated transaminase level 12/20/2015  . Chronic fatigue 12/02/2015  . Raynaud's phenomenon 04/16/2014  . Insomnia 03/31/2014  . Syncope 11/11/2012  . Autonomic postural hypotension 11/11/2012  . Parkinson's disease ([place]) 06/05/2012  . Restless leg syndrome 11/15/2011  . Primary hypothyroidism 09/11/2011  . Parkinsonism ([place]) 04/02/2011  . Depression, major, recurrent, in partial remission ([place]) 04/02/2011    Carey Bullocks, OTR/L 01/09/2017, 1:10 PM  [facility] [address] [place], [place], [address] Phone: [phone]   Fax:  [phone]  Name: Sanel Stemmer MRN: [id number] Date of Birth: 10-17-1945

## 2017-01-15 ENCOUNTER — Encounter: Payer: Self-pay | Admitting: Physical Therapy

## 2017-01-15 ENCOUNTER — Ambulatory Visit: Payer: Medicare Other | Admitting: Occupational Therapy

## 2017-01-15 ENCOUNTER — Ambulatory Visit: Payer: Medicare Other | Admitting: Speech Pathology

## 2017-01-15 ENCOUNTER — Ambulatory Visit: Payer: Medicare Other | Admitting: Physical Therapy

## 2017-01-15 DIAGNOSIS — R293 Abnormal posture: Secondary | ICD-10-CM

## 2017-01-15 DIAGNOSIS — R471 Dysarthria and anarthria: Secondary | ICD-10-CM

## 2017-01-15 DIAGNOSIS — M6281 Muscle weakness (generalized): Secondary | ICD-10-CM | POA: Diagnosis not present

## 2017-01-15 DIAGNOSIS — R2689 Other abnormalities of gait and mobility: Secondary | ICD-10-CM | POA: Diagnosis not present

## 2017-01-15 DIAGNOSIS — R278 Other lack of coordination: Secondary | ICD-10-CM | POA: Diagnosis not present

## 2017-01-15 DIAGNOSIS — R29898 Other symptoms and signs involving the musculoskeletal system: Secondary | ICD-10-CM | POA: Diagnosis not present

## 2017-01-15 DIAGNOSIS — R29818 Other symptoms and signs involving the nervous system: Secondary | ICD-10-CM | POA: Diagnosis not present

## 2017-01-15 DIAGNOSIS — R1312 Dysphagia, oropharyngeal phase: Secondary | ICD-10-CM

## 2017-01-15 NOTE — Therapy (Signed)
[facility] [address] [place], [place], [address] Phone: [phone]   Fax:  [phone]  Occupational Therapy Treatment  Patient Details  Name: [name] MRN: [id number] Date of Birth: 1945-06-05 Referring Provider: Dr. Carles Collet   Encounter Date: 01/15/2017  OT End of Session - 01/15/17 1223    Visit Number  15    Number of Visits  17    Date for OT Re-Evaluation  01/19/17    Authorization Type  MCR Primary - G code needed, UHC secondary    Authorization - Visit Number  15    Authorization - Number of Visits  20    OT Start Time  1015    OT Stop Time  1100    OT Time Calculation (min)  45 min    Activity Tolerance  Patient tolerated treatment well       Past Medical History:  Diagnosis Date  . Allergy   . Anemia   . Depression   . Parkinson's disease ([place])   . Parkinson's disease ([place]) 06/05/2012  . Raynaud's syndrome   . Thyroid disease     Past Surgical History:  Procedure Laterality Date  . APPENDECTOMY    . FRACTURE SURGERY     jaw  . HERNIA REPAIR    . MANDIBLE FRACTURE SURGERY    . ROTATOR CUFF REPAIR    . TONSILLECTOMY    . VASECTOMY    . WRIST FRACTURE SURGERY      There were no vitals filed for this visit.  Subjective Assessment - 01/15/17 1018    Patient is accompained by:  Family member    Pertinent History  PD since 2009, spinal stenosis, scoliosis, HOH, Raynauds, h/o falls with radius fx in 2011?, Rt ankle fx 2017, Rt rotator cuff surgery 2003    Limitations  fall risk, limited driving    Patient Stated Goals  put on my watch    Currently in Pain?  Yes    Pain Score  2     Pain Location  Hip    Pain Orientation  Right    Pain Descriptors / Indicators  Aching    Pain Type  Chronic pain    Pain Onset  More than a month ago    Aggravating Factors   lying on the side    Pain Relieving Factors  injection to hip for pain                   OT  Treatments/Exercises (OP) - 01/15/17 0001      ADLs   UB Dressing  Practiced donning/doffing jacket x 4 reps with strategies/large amplitude movements. Practiced hooking buttons, however despite large movements, and PWR! Hands, pt still has max difficulty with this. Pt shown button hook and practiced using with greater success. Pt/wife instructed in where/how to purchase    Writing  Reviewed writing strategies and pt practiced writing 2 sentences w/o breaks maintaining 100% legibility    ADL Comments  Pt practiced opening jars and medicine bottles with big turns. Pt would switch to Lt hand for this. Recommended using Rt hand as dominant hand but angling jar for greater ease. Pt did better with this. Also reviewed strategies for cutting food/eating and other ADLS.       Neurological Re-education Exercises   Other Exercises 1  Reviewed PWR! Hands (basic 4) and pt performed each x 5 reps.  OT Short Term Goals - 12/25/16 1055      OT SHORT TERM GOAL #1   Title  Pt/family will be independent with PD specific HEP including PWR! moves and coordination HEP     Time  4    Period  Weeks    Status  Achieved      OT SHORT TERM GOAL #2   Title  Pt will write at least 3 sentences with no significant decr in size and 95% legibility or greater     Time  4    Period  Weeks    Status  Achieved      OT SHORT TERM GOAL #3   Title  Pt will perform PPT #2 test in 12 sec. or under    Baseline  eval = 15.78 sec    Time  4    Period  Weeks    Status  Achieved 12/20/16 = 8.59 sec      OT SHORT TERM GOAL #4   Title  Pt will perform PPT #4 test in 22 sec. or under    Baseline  eval = 25.29 sec    Time  4    Period  Weeks    Status  Achieved 12/20/16 = 16 sec      OT SHORT TERM GOAL #5   Title  Pt will increase coordination Rt hand as evidenced by performing 9 hole peg test in 32 sec. or less    Baseline  eval = 36.90 sec    Time  4    Period  Weeks    Status  Achieved Rt =  18.25 sec      OT SHORT TERM GOAL #6   Title  Pt will increase RUE function as evidenced by performing 34 blocks or greater on Box & Blocks test    Baseline  eval = 30 (Lt = 48)     Time  4    Period  Weeks    Status  Achieved Rt = 49 sec        OT Long Term Goals - 01/15/17 1226      OT LONG TERM GOAL #1   Title  Pt will verbalize understanding of ways to prevent future PD related complications and appropriate community and national resources prn    Time  8    Period  Weeks    Status  Achieved      OT LONG TERM GOAL #2   Title  Pt will verbalize understanding of adaptive strategies to incr ease with ADLS/IADLS    Time  8    Period  Weeks    Status  Achieved      OT LONG TERM GOAL #3   Title  Pt will perform PPT #4 in 19 sec. or under    Baseline  eval = 25.29 sec    Time  8    Period  Weeks    Status  Achieved      OT LONG TERM GOAL #4   Title  Pt will improve functional coordination as evidenced by reducing time on 3 button test to 40 sec. or less    Baseline  59.01 sec. (standing with shirt on)     Time  8    Period  Weeks    Status  On-going      OT LONG TERM GOAL #5   Title  Pt will verbalize understanding of ways to keep thinking skills sharp    Time  8    Period  Weeks    Status  Achieved      OT LONG TERM GOAL #6   Title  Pt to report using RUE as dominant UE for ADLS/IADLS 61% of the time    Time  8    Period  Weeks    Status  Achieved 12/25/16            Plan - 01/15/17 1224    Clinical Impression Statement  Pt has progressed functionally as well as meeting goals. Pt most challenged with dual tasking and hooking buttons    Rehab Potential  Good    OT Treatment/Interventions  Self-care/ADL training;DME and/or AE instruction;Patient/family education;Splinting;Therapeutic exercises;Therapeutic activities;Neuromuscular education;Functional Mobility Training;Passive range of motion;Cognitive remediation/compensation;Visual/perceptual  remediation/compensation;Manual Therapy    Plan  review coordination HEP, Assess all objective measures again and LTG #4 for d/c. D/C next session    OT Home Exercise Plan  11/27/16: PWR! Seated, POP info, national websites. 11/29/16: PWR! Hands, tips for constipation. 12/05/16: PWR! Supine. 12/07/16: coordination HEP. 12/11/16: Writing strategies. 12/13/16: movement strategies for ADLS. 12/27/16: memory strategies/ways to keep thinking skills sharp    Consulted and Agree with Plan of Care  Patient;Family member/caregiver    Family Member Consulted  Wife       Patient will benefit from skilled therapeutic intervention in order to improve the following deficits and impairments:  Decreased coordination, Impaired flexibility, Improper body mechanics, Improper spinal/pelvic alignment, Decreased safety awareness, Decreased endurance, Decreased knowledge of precautions, Impaired tone, Impaired UE functional use, Pain, Decreased knowledge of use of DME, Decreased cognition, Decreased mobility, Decreased strength, Impaired perceived functional ability, Impaired vision/preception  Visit Diagnosis: Other symptoms and signs involving the nervous system  Other symptoms and signs involving the musculoskeletal system  Other lack of coordination  Abnormal posture    Problem List Patient Active Problem List   Diagnosis Date Noted  . Lumbar back pain with radiculopathy affecting right lower extremity 09/19/2016  . Elevated transaminase level 12/20/2015  . Chronic fatigue 12/02/2015  . Raynaud's phenomenon 04/16/2014  . Insomnia 03/31/2014  . Syncope 11/11/2012  . Autonomic postural hypotension 11/11/2012  . Parkinson's disease ([place]) 06/05/2012  . Restless leg syndrome 11/15/2011  . Primary hypothyroidism 09/11/2011  . Parkinsonism ([place]) 04/02/2011  . Depression, major, recurrent, in partial remission ([place]) 04/02/2011    Carey Bullocks, OTR/L 01/15/2017, 12:27 PM  [facility] [address] [place], [place], [address] Phone: [phone]   Fax:  [phone]  Name: [name] MRN: [id number] Date of Birth: Sep 24, 1945

## 2017-01-15 NOTE — Therapy (Signed)
[facility] [address] [place], [place], [address] Phone: [phone]   Fax:  [phone]  Speech Language Pathology Treatment  Patient Details  Name: [name] MRN: [id number] Date of Birth: 12/23/1945 Referring Provider: Dr. Wells Guiles Tat   Encounter Date: 01/08/2017    Past Medical History:  Diagnosis Date  . Allergy   . Anemia   . Depression   . Parkinson's disease ([place])   . Parkinson's disease ([place]) 06/05/2012  . Raynaud's syndrome   . Thyroid disease     Past Surgical History:  Procedure Laterality Date  . APPENDECTOMY    . FRACTURE SURGERY     jaw  . HERNIA REPAIR    . MANDIBLE FRACTURE SURGERY    . ROTATOR CUFF REPAIR    . TONSILLECTOMY    . VASECTOMY    . WRIST FRACTURE SURGERY      There were no vitals filed for this visit.       01/08/17 1100  Symptoms/Limitations  Subjective Pt arrives with wife for treatment.  Special Tests spouse, [name]  Pain Assessment  Currently in Pain? No/denies       01/08/17 1102  General Information  Behavior/Cognition Alert;Cooperative;Pleasant mood  Patient Positioning Upright in chair  Treatment Provided  Treatment provided Dysphagia  Dysphagia Treatment  Temperature Spikes Noted No  Respiratory Status Room air  Oral Cavity - Dentition Adequate natural dentition  Treatment Methods Therapeutic exercise;Compensation strategy training;Patient/caregiver education  Patient observed directly with PO's Yes  Feeding Able to feed self  Liquids provided via Cup  Pharyngeal Phase Signs & Symptoms Delayed throat clear;Immediate cough;Delayed cough;Complaints of residue  Other treatment/comments Pt demo'd HEP for dysphagia with rare min A. Demo'd swallow precautions with occasional min A. Noted cough x2 when pt used effortful swallow with thin liquids. SLP reviewed pt's MBS which noted effortful swallow resulted in decreased airway protection with  liquids but was felt to be effective for solids. SLP reviewed this with pt and his wife, pt used effortful swallow only with solids, as well as dry swallows for clearance. Pt continued with intermittent subtle throat clearing but without significant coughing episodes, though delayed coughing noted 1-2 minutes after PO. Also noted decreased hyolaryngeal elevation per MBS. Pt not currently completing Shaker exercise at home; SLP educated re: rationale for strengthening hyolarygneal complex for improved airway protection as well as epiglottic deflection which may improve clearance of solids retained in the valleculae. Trained pt in completing this exercise and instructed to begin at home (3 x 45 second holds and 20-30 1 second holds).  Type of PO's observed Regular;Thin liquids  Assessment / Recommendations / Plan  Plan Continue with current plan of care  Dysphagia Recommendations  Diet recommendations Dysphagia 3 (mechanical soft)  Liquids provided via Cup  Supervision Intermittent supervision to cue for compensatory strategies  Compensations Slow rate;Small sips/bites;Multiple dry swallows after each bite/sip;Follow solids with liquid (effortful swallow)  Progression Toward Goals  Progression toward goals Progressing toward goals       01/08/17 1145  SLP Visits / Re-Eval  Visit Number 9  Number of Visits 17  Date for SLP Re-Evaluation 01/19/17  Authorization  Authorization Type may change re-eval date, as pt not scheudled to start ST for 2 weeks after eval  SLP Time Calculation  SLP Start Time 1105  SLP Stop Time  1143  SLP Time Calculation (min) 38 min  SLP - End of Session  Activity Tolerance Patient tolerated treatment well  01/08/17 1145  SLP Assessment/Plan  Clinical Impression Statement Pt followed swallow precautions with rare min A cues without distractions. Trained in additional exercise (Shaker) for dysphagia for hyolaryngeal excursion. Pt completes with rare min A.  Spouse reports good carryover of precautions at home with her verbal cues. Continue skilled ST to maximize intelligibility and safety of swallow.   Speech Therapy Frequency 2x / week  Treatment/Interventions SLP instruction and feedback;Internal/external aids;Compensatory strategies;Environmental controls;Oral motor exercises;Patient/family education;Pharyngeal strengthening exercises;Functional tasks;Language facilitation  Potential to Achieve Goals Good  Potential Considerations Cooperation/participation level  SLP Home Exercise Plan Loud "AH", dysphagia HEP; dysarthria HEP  Consulted and Agree with Plan of Care Patient;Family member/caregiver  Family Member Consulted spouse [name]      SLP Short Term Goals - 01/09/17 1153      SLP SHORT TERM GOAL #1   Title  Pt will average 90dB on loud "AH" with rare min A over 3 sessions.    Status  Achieved      SLP SHORT TERM GOAL #2   Title  Pt will perform HEP for dysphagia and dysarthria with occasional min A over 3 sessions    Time  1    Period  Weeks    Status  Partially Met 2/3      SLP SHORT TERM GOAL #3   Title  Pt will follow diet modificiations/swallow precautions with occasional min A over 2 sessions    Baseline  01-03-17, 01-08-17    Status  Achieved      SLP SHORT TERM GOAL #4   Status  Partially Met       SLP Long Term Goals - 01/09/17 1154      SLP LONG TERM GOAL #1   Title  Pt will average 92dB on loud "AH" over 3 sessions with rare min A    Baseline  01/09/17;     Time  5    Period  Weeks    Status  On-going      SLP LONG TERM GOAL #2   Title  Pt will complete HEP for dysphagia and dysarthria with rare min A over 2 sessions    Baseline  01/09/17    Time  5    Period  Weeks    Status  On-going      SLP LONG TERM GOAL #3   Title  Spouse/pt will report carryover of swallow precautions/diet modifications outside of therapy with occasional min cues from wife, over 3 sessions    Baseline  01-03-17 01/09/17    Time   5    Period  Weeks    Status  On-going      SLP LONG TERM GOAL #4   Title  Pt will average 70 dB over 15 minute conversation with rare min A over 3 sessions    Time  5    Period  Weeks    Status  On-going      SLP LONG TERM GOAL #5   Title  Pt will be audible and intelligible in noisy environment during 15 minute conversation over 2 sessions.    Time  5    Period  Weeks    Status  On-going         Patient will benefit from skilled therapeutic intervention in order to improve the following deficits and impairments:   Dysphagia, oropharyngeal phase    Problem List Patient Active Problem List   Diagnosis Date Noted  . Lumbar back pain with radiculopathy affecting right lower  extremity 09/19/2016  . Elevated transaminase level 12/20/2015  . Chronic fatigue 12/02/2015  . Raynaud's phenomenon 04/16/2014  . Insomnia 03/31/2014  . Syncope 11/11/2012  . Autonomic postural hypotension 11/11/2012  . Parkinson's disease ([place]) 06/05/2012  . Restless leg syndrome 11/15/2011  . Primary hypothyroidism 09/11/2011  . Parkinsonism ([place]) 04/02/2011  . Depression, major, recurrent, in partial remission ([place]) 04/02/2011   Deneise Lever, [place], [place] 01/15/2017, 8:04 AM  [facility] [address] [place], [place], [address] Phone: [phone]   Fax:  [phone]   Name: [name] MRN: [id number] Date of Birth: Jul 21, 1945

## 2017-01-15 NOTE — Patient Instructions (Addendum)

## 2017-01-15 NOTE — Therapy (Signed)
[facility] [address] [place], [place], [address] Phone: [phone]   Fax:  [phone]  Speech Language Pathology Treatment  Patient Details  Name: [name] MRN: [id number] Date of Birth: 12/13/1945 Referring Provider: Dr. Rebecca Tat   Encounter Date: 01/15/2017  End of Session - 01/15/17 1910    Visit Number  10    Number of Visits  17    Date for SLP Re-Evaluation  01/19/17    Authorization Type  may change re-eval date, as pt not scheudled to start ST for 2 weeks after eval    SLP Start Time  1102    SLP Stop Time   1144    SLP Time Calculation (min)  42 min    Activity Tolerance  Patient tolerated treatment well       Past Medical History:  Diagnosis Date  . Allergy   . Anemia   . Depression   . Parkinson's disease (HCC)   . Parkinson's disease (HCC) 06/05/2012  . Raynaud's syndrome   . Thyroid disease     Past Surgical History:  Procedure Laterality Date  . APPENDECTOMY    . FRACTURE SURGERY     jaw  . HERNIA REPAIR    . MANDIBLE FRACTURE SURGERY    . ROTATOR CUFF REPAIR    . TONSILLECTOMY    . VASECTOMY    . WRIST FRACTURE SURGERY      There were no vitals filed for this visit.  Subjective Assessment - 01/15/17 1104    Subjective  Pt reports "bad weekend for eating", had difficulty with pizza and burger.    Special Tests  spouse, Kathy    Currently in Pain?  Yes    Pain Score  2     Pain Location  Hip    Pain Orientation  Right    Pain Descriptors / Indicators  Aching    Pain Type  Chronic pain    Pain Onset  More than a month ago    Pain Frequency  Intermittent    Aggravating Factors   side lying    Pain Relieving Factors  injection to hip for pain            ADULT SLP TREATMENT - 01/15/17 1106      General Information   Behavior/Cognition  Alert;Cooperative;Pleasant mood    Patient Positioning  Upright in chair      Treatment Provided   Treatment  provided  Cognitive-Linquistic      Cognitive-Linquistic Treatment   Treatment focused on  Dysarthria    Skilled Treatment  Loud /a/ to recalibrate volume average 96dB independence. Structured speech tasks averaged 73dB with rare min A for volume, pt managing saliva well. 15 minutes simple conversation with average of low 70s initially; as conversation progressed pt required occasional min A due to apparent fatigue and occasionally with increased cognitive load.       Assessment / Recommendations / Plan   Plan  Continue with current plan of care      Dysphagia Recommendations   Diet recommendations  Dysphagia 3 (mechanical soft)    Liquids provided via  Cup    Supervision  Intermittent supervision to cue for compensatory strategies    Compensations  Slow rate;Small sips/bites;Multiple dry swallows after each bite/sip;Effortful swallow;Follow solids with liquid      Progression Toward Goals   Progression toward goals  Progressing toward goals       SLP Education - 01/15/17   1909    Education provided  Yes    Education Details  Review precautions prior to dining out; remove straws. Signs and risks of aspiration pneumonia    Person(s) Educated  Patient;Spouse    Methods  Explanation    Comprehension  Verbalized understanding       SLP Short Term Goals - 01/15/17 1110      SLP SHORT TERM GOAL #1   Title  Pt will average 90dB on loud "AH" with rare min A over 3 sessions.    Status  Achieved      SLP SHORT TERM GOAL #2   Title  Pt will perform HEP for dysphagia and dysarthria with occasional min A over 3 sessions    Status  Partially Met      SLP SHORT TERM GOAL #3   Title  Pt will follow diet modificiations/swallow precautions with occasional min A over 2 sessions    Status  Achieved      SLP SHORT TERM GOAL #4   Title  Pt will average 72dB on structured speech tasks 10/12 sentences with occasional min A    Status  Partially Met      SLP SHORT TERM GOAL #5   Title  Pt will  utlize compensations for dysarthria/slur at sentence level 10/12 sentences with occasional min A    Status  Achieved       SLP Long Term Goals - 01/15/17 1110      SLP LONG TERM GOAL #1   Title  Pt will average 92dB on loud "AH" over 3 sessions with rare min A    Time  4    Period  Weeks    Status  On-going      SLP LONG TERM GOAL #2   Title  Pt will complete HEP for dysphagia and dysarthria with rare min A over 2 sessions    Time  4    Period  Weeks    Status  On-going      SLP LONG TERM GOAL #3   Title  Spouse/pt will report carryover of swallow precautions/diet modifications outside of therapy with occasional min cues from wife, over 3 sessions    Time  4    Period  Weeks    Status  On-going      SLP LONG TERM GOAL #4   Title  Pt will average 70 dB over 15 minute conversation with rare min A over 3 sessions    Time  4    Period  Weeks    Status  On-going      SLP LONG TERM GOAL #5   Title  Pt will be audible and intelligible in noisy environment during 15 minute conversation over 2 sessions.    Time  4    Period  Weeks    Status  On-going       Plan - 01/15/17 1911    Clinical Impression Statement  Pt required occasional min A to carryover volume in simple 15 minutes conversation. He required min A for over articulation. Spouse reports concerns with swallowing over the weekend; pt had difficulty with pizza and a burger while dining out. SLP suggested reviewing swallow precautions prior to entering restaurant, limiting distractions while eating. Pt may benefit from bringing troublesome foods for diagnostic treatment, compensation training during ST session. Continue skilled ST to maximize intelligibility and safety of swallow.    Speech Therapy Frequency  2x / week    Treatment/Interventions  SLP instruction   and feedback;Internal/external aids;Compensatory strategies;Environmental controls;Oral motor exercises;Patient/family education;Pharyngeal strengthening  exercises;Functional tasks;Language facilitation    Potential to Achieve Goals  Good    Potential Considerations  Cooperation/participation level    SLP Home Exercise Plan  Loud "AH", dysphagia HEP; dysarthria HEP    Consulted and Agree with Plan of Care  Patient;Family member/caregiver    Family Member Consulted  spouse [name]       Patient will benefit from skilled therapeutic intervention in order to improve the following deficits and impairments:   Dysarthria and anarthria  Dysphagia, oropharyngeal phase  G-Codes - Feb 01, 2017 1916    Functional Limitations  Motor speech    Motor Speech Current Status [phone])  At least 40 percent but less than 60 percent impaired, limited or restricted    Motor Speech Goal Status (H8527)  At least 20 percent but less than 40 percent impaired, limited or restricted    Motor Speech Goal Status (P8242)  At least 40 percent but less than 60 percent impaired, limited or restricted       Problem List Patient Active Problem List   Diagnosis Date Noted  . Lumbar back pain with radiculopathy affecting right lower extremity 09/19/2016  . Elevated transaminase level 12/20/2015  . Chronic fatigue 12/02/2015  . Raynaud's phenomenon 04/16/2014  . Insomnia 03/31/2014  . Syncope 11/11/2012  . Autonomic postural hypotension 11/11/2012  . Parkinson's disease ([place]) 06/05/2012  . Restless leg syndrome 11/15/2011  . Primary hypothyroidism 09/11/2011  . Parkinsonism ([place]) 04/02/2011  . Depression, major, recurrent, in partial remission ([place]) 04/02/2011   Deneise Lever, [place], [place] 02-01-17, 7:17 PM  [facility] [address] [place], [place], [address] Phone: [phone]   Fax:  [phone]   Name: [name] MRN: [id number] Date of Birth: 07/03/1945

## 2017-01-15 NOTE — Therapy (Addendum)
[facility] [address] [place], [place], [address] Phone: [phone]   Fax:  [phone]  Speech Language Pathology Treatment  Patient Details  Name: [name] MRN: [id number] Date of Birth: 07/20/1945 Referring Provider: Dr. Rebecca Tat   Encounter Date: 01/15/2017  End of Session - 01/15/17 1910    Visit Number  10    Number of Visits  17    Date for SLP Re-Evaluation  01/19/17    Authorization Type  may change re-eval date, as pt not scheudled to start ST for 2 weeks after eval    SLP Start Time  1102    SLP Stop Time   1144    SLP Time Calculation (min)  42 min    Activity Tolerance  Patient tolerated treatment well       Past Medical History:  Diagnosis Date  . Allergy   . Anemia   . Depression   . Parkinson's disease (HCC)   . Parkinson's disease (HCC) 06/05/2012  . Raynaud's syndrome   . Thyroid disease     Past Surgical History:  Procedure Laterality Date  . APPENDECTOMY    . FRACTURE SURGERY     jaw  . HERNIA REPAIR    . MANDIBLE FRACTURE SURGERY    . ROTATOR CUFF REPAIR    . TONSILLECTOMY    . VASECTOMY    . WRIST FRACTURE SURGERY      There were no vitals filed for this visit.  Subjective Assessment - 01/15/17 1104    Subjective  Pt reports "bad weekend for eating", had difficulty with pizza and burger.    Special Tests  spouse, Kathy    Currently in Pain?  Yes    Pain Score  2     Pain Location  Hip    Pain Orientation  Right    Pain Descriptors / Indicators  Aching    Pain Type  Chronic pain    Pain Onset  More than a month ago    Pain Frequency  Intermittent    Aggravating Factors   side lying    Pain Relieving Factors  injection to hip for pain            ADULT SLP TREATMENT - 01/15/17 1106      General Information   Behavior/Cognition  Alert;Cooperative;Pleasant mood    Patient Positioning  Upright in chair      Treatment Provided   Treatment  provided  Cognitive-Linquistic      Cognitive-Linquistic Treatment   Treatment focused on  Dysarthria    Skilled Treatment  Loud /a/ to recalibrate volume average 96dB independence. Structured speech tasks averaged 73dB with rare min A for volume, pt managing saliva well. 15 minutes simple conversation with average of low 70s initially; as conversation progressed pt required occasional min A due to apparent fatigue and occasionally with increased cognitive load.       Assessment / Recommendations / Plan   Plan  Continue with current plan of care      Dysphagia Recommendations   Diet recommendations  Dysphagia 3 (mechanical soft)    Liquids provided via  Cup    Supervision  Intermittent supervision to cue for compensatory strategies    Compensations  Slow rate;Small sips/bites;Multiple dry swallows after each bite/sip;Effortful swallow;Follow solids with liquid      Progression Toward Goals   Progression toward goals  Progressing toward goals       SLP Education - 01/15/17   1909    Education provided  Yes    Education Details  Review precautions prior to dining out; remove straws. Signs and risks of aspiration pneumonia    Person(s) Educated  Patient;Spouse    Methods  Explanation    Comprehension  Verbalized understanding       SLP Short Term Goals - 01/15/17 1110      SLP SHORT TERM GOAL #1   Title  Pt will average 90dB on loud "AH" with rare min A over 3 sessions.    Status  Achieved      SLP SHORT TERM GOAL #2   Title  Pt will perform HEP for dysphagia and dysarthria with occasional min A over 3 sessions    Status  Partially Met      SLP SHORT TERM GOAL #3   Title  Pt will follow diet modificiations/swallow precautions with occasional min A over 2 sessions    Status  Achieved      SLP SHORT TERM GOAL #4   Title  Pt will average 72dB on structured speech tasks 10/12 sentences with occasional min A    Status  Partially Met      SLP SHORT TERM GOAL #5   Title  Pt will  utlize compensations for dysarthria/slur at sentence level 10/12 sentences with occasional min A    Status  Achieved       SLP Long Term Goals - 01/15/17 1110      SLP LONG TERM GOAL #1   Title  Pt will average 92dB on loud "AH" over 3 sessions with rare min A    Time  4    Period  Weeks    Status  On-going      SLP LONG TERM GOAL #2   Title  Pt will complete HEP for dysphagia and dysarthria with rare min A over 2 sessions    Time  4    Period  Weeks    Status  On-going      SLP LONG TERM GOAL #3   Title  Spouse/pt will report carryover of swallow precautions/diet modifications outside of therapy with occasional min cues from wife, over 3 sessions    Time  4    Period  Weeks    Status  On-going      SLP LONG TERM GOAL #4   Title  Pt will average 70 dB over 15 minute conversation with rare min A over 3 sessions    Time  4    Period  Weeks    Status  On-going      SLP LONG TERM GOAL #5   Title  Pt will be audible and intelligible in noisy environment during 15 minute conversation over 2 sessions.    Time  4    Period  Weeks    Status  On-going       Plan - 01/15/17 1911    Clinical Impression Statement  Pt required occasional min A to carryover volume in simple 15 minutes conversation. He required min A for over articulation. Spouse reports concerns with swallowing over the weekend; pt had difficulty with pizza and a burger while dining out. SLP suggested reviewing swallow precautions prior to entering restaurant, limiting distractions while eating. Pt may benefit from bringing troublesome foods for diagnostic treatment, compensation training during ST session. Continue skilled ST to maximize intelligibility and safety of swallow.    Speech Therapy Frequency  2x / week    Treatment/Interventions  SLP instruction   and feedback;Internal/external aids;Compensatory strategies;Environmental controls;Oral motor exercises;Patient/family education;Pharyngeal strengthening  exercises;Functional tasks;Language facilitation    Potential to Achieve Goals  Good    Potential Considerations  Cooperation/participation level    SLP Home Exercise Plan  Loud "AH", dysphagia HEP; dysarthria HEP    Consulted and Agree with Plan of Care  Patient;Family member/caregiver    Family Member Consulted  spouse [name]       Patient will benefit from skilled therapeutic intervention in order to improve the following deficits and impairments:   Dysarthria and anarthria  Dysphagia, oropharyngeal phase  G-Codes - 22-Jan-2017 1916    Functional Limitations  Motor speech    Motor Speech Current Status [phone])  At least 40 percent but less than 60 percent impaired, limited or restricted    Motor Speech Goal Status (T6429)  At least 20 percent but less than 40 percent impaired, limited or restricted    Motor Speech Goal Status (I3795)  At least 40 percent but less than 60 percent impaired, limited or restricted       Problem List Patient Active Problem List   Diagnosis Date Noted  . Lumbar back pain with radiculopathy affecting right lower extremity 09/19/2016  . Elevated transaminase level 12/20/2015  . Chronic fatigue 12/02/2015  . Raynaud's phenomenon 04/16/2014  . Insomnia 03/31/2014  . Syncope 11/11/2012  . Autonomic postural hypotension 11/11/2012  . Parkinson's disease ([place]) 06/05/2012  . Restless leg syndrome 11/15/2011  . Primary hypothyroidism 09/11/2011  . Parkinsonism ([place]) 04/02/2011  . Depression, major, recurrent, in partial remission ([place]) 04/02/2011    Speech Therapy Progress Note  Dates of Reporting Period: 11/22/16 to present  Subjective Statement: Pt has been seen for 10 speech therapy visits addressing dysarthria and dysphagia secondary to Parkinson's disease.  Objective Measurements: Pt requires occasional min A to carry over WNL loudness to simple conversation.  Goal Update: Progressing. See goals above  Plan: Continue current plan of  care  Reason Skilled Services are Required: Pt requires skilled ST services in order to maximize intelligibility and safety of swallow.   Deneise Lever, [place], CCC-SLP Speech-Language Pathologist   Aliene Altes Jan 22, 2017, 7:17 PM  [facility] [address] [place], [place], [address] Phone: [phone]   Fax:  [phone]   Name: [name] MRN: [id number] Date of Birth: 1945/08/05

## 2017-01-15 NOTE — Therapy (Signed)
[facility] [address] [place], [place], [address] Phone: [phone]   Fax:  [phone]  Physical Therapy Treatment  Patient Details  Name: [name] MRN: [id number] Date of Birth: 07-Sep-1945 Referring Provider: Alonza Bogus, DO   Encounter Date: 01/15/2017  PT End of Session - 01/15/17 1845    Visit Number  15 next g-code on visit 20    Number of Visits  17    Date for PT Re-Evaluation  01/19/17    Authorization Type  Medicare, [employer]    PT Start Time  [phone]    PT Stop Time  1013    PT Time Calculation (min)  42 min    Activity Tolerance  Patient tolerated treatment well    Behavior During Therapy  [facility] for tasks assessed/performed       Past Medical History:  Diagnosis Date  . Allergy   . Anemia   . Depression   . Parkinson's disease ([place])   . Parkinson's disease ([place]) 06/05/2012  . Raynaud's syndrome   . Thyroid disease     Past Surgical History:  Procedure Laterality Date  . APPENDECTOMY    . FRACTURE SURGERY     jaw  . HERNIA REPAIR    . MANDIBLE FRACTURE SURGERY    . ROTATOR CUFF REPAIR    . TONSILLECTOMY    . VASECTOMY    . WRIST FRACTURE SURGERY      There were no vitals filed for this visit.  Subjective Assessment - 01/15/17 0934    Subjective  Had to change a flat tire Saturday and was able to get the tire off and someone pulled over to help him finish. Proud of himself. Knows he has improved from therapy and ready to "take a break."    Patient is accompained by:  Family member    Pertinent History  anemia, depression, Parkinson's disease dx 2009, R foot fracture ([facility] for foot fracture in March 2018); R RTC repair, scoliosis (not a surgical candidate)    Patient Stated Goals  Pt's goals for therapy include exercises for back, help with more mobility in R hand; be able to stand up straighter and walk better (per wife)    Currently in Pain?  Yes    Pain Score  2      Pain Location  Hip    Pain Orientation  Right    Pain Descriptors / Indicators  Aching    Pain Type  Chronic pain    Pain Onset  More than a month ago    Pain Frequency  Intermittent                      [facility] Adult PT Treatment/Exercise - 01/15/17 1001      Transfers   Transfers  Sit to Stand;Stand to Sit    Sit to Stand  6: Modified independent (Device/Increase time);4: Min guard;Without upper extremity assist    Stand to Sit  6: Modified independent (Device/Increase time)    Number of Reps  10 reps;2 sets    Comments  chair ~18"; bench ~14" with min assist for balance x 4 reps and minguard remaining 6 reps      Ambulation/Gait   Ambulation/Gait Assistance  5: Supervision    Ambulation/Gait Assistance Details  vc for upright posture, otherwise no cues needed    Ambulation Distance (Feet)  500 Feet    Assistive device  None  Gait Pattern  Step-through pattern;Decreased trunk rotation;Trunk rotated posteriorly on right;Trunk flexed    Ambulation Surface  Indoor      Posture/Postural Control   Posture/Postural Control  Postural limitations    Postural Limitations  Forward head    Posture Comments  chin tuck in sitting with inability to fully retract head over shoulders; repeated in supine with pt closer to head in neutral during chin tuck with gravity assist      Standardized Balance Assessment   Standardized Balance Assessment  Dynamic Gait Index      Dynamic Gait Index   Level Surface  Normal 5.22    Change in Gait Speed  Normal    Gait with Horizontal Head Turns  Mild Impairment    Gait with Vertical Head Turns  Normal    Gait and Pivot Turn  Normal    Step Over Obstacle  Mild Impairment    Step Around Obstacles  Normal    Steps  Mild Impairment    Total Score  21        [facility]) - 01/15/17 1842    PWR! exercises  Moves in standing    PWR! Up  2 sets x 10 reps          PT Education - 01/15/17 1843    Education provided  Yes     Education Details  Fall prevention handout--discussed his falls almost always due to orthostasis and "blacks out." Educated on using UE and LE seated exercises prior to standing. Plan for PD screen in 6 months-described process and rationale    Person(s) Educated  Patient;Spouse    Methods  Explanation    Comprehension  Verbalized understanding       PT Short Term Goals - 12/25/16 0957      PT SHORT TERM GOAL #1   Title  Pt will perform HEP with wife's supervision, to address Parkinson-specific deficits.  TARGET 12/22/16    Time  5    Period  Weeks    Status  Achieved      PT SHORT TERM GOAL #2   Title  Pt will improve 5x sit<>stand to less than or equal to 15 seconds for improved efficiency and safety with transfers.    Baseline  11/5 15.37 sec    Time  5    Period  Weeks    Status  Partially Met      PT SHORT TERM GOAL #3   Title  Pt will improve TUG score to less than or equal to 13.5 seconds for decreased fall risk.    Baseline  11/5 7.97 sec    Time  5    Period  Weeks    Status  Achieved      PT SHORT TERM GOAL #4   Title  Pt will improve DGI score to at least 15/24 for decreased fall risk.    Baseline  11/5 18/24    Time  5    Period  Weeks    Status  Achieved      PT SHORT TERM GOAL #5   Title  Pt/wife will verbalize understanding of local Parkinson's disease resources.    Baseline  11/5 pt did not recall receiving resource information; wife thought they had    Time  5    Period  Weeks    Status  Achieved        PT Long Term Goals - 01/15/17 1852      PT LONG TERM  GOAL #1   Title  Pt/wife will verbalize understanding of fall prevention in the home environment.  TARGET 01/19/17    Baseline  11/26 information provided    Time  9    Period  Weeks    Status  On-going      PT LONG TERM GOAL #2   Title  Pt will perform sit<>stand from surfaces lower than 18", minimal UE support, at least 6 of 10 reps, modified independently, for improved car and low surface  transfers.    Time  9    Period  Weeks    Status  New      PT LONG TERM GOAL #3   Title  Pt will improve TUG cognitive score to less tahn or equal to 15 seconds for decreased fall risk.    Time  9    Period  Weeks    Status  New      PT LONG TERM GOAL #4   Title  Pt will improve DGI score to at least 19/24 for decreased fall risk.    Baseline  11/26  21/24    Time  9    Period  Weeks    Status  Achieved      PT LONG TERM GOAL #5   Title  Pt will verbalize plans for continued community fitness upon d/c from PT.    Time  9    Period  Weeks    Status  New            Plan - 01/15/17 1846    Clinical Impression Statement  Session included assessing pt's understanding of recent addition to HEP (required min cues to not pull/throw his shoulders posteriorly beyond upright posture), gait and strength training. Began assessing LTGs with pt meeting the goal related to improving his DGI score with reduction in fall risk. Patient/wife pleased with patient's progress and feel ready for discharge after next visit.    Rehab Potential  Good    Clinical Impairments Affecting Rehab Potential  scoliosis (not a surgical candidate) with chronic back pain    PT Frequency  2x / week 1x/wk for 1 week, then 2x/wk for 8 weeks    PT Duration  Other (comment) total POC = 9 weeks    PT Treatment/Interventions  ADLs/Self Care Home Management;Gait training;DME Instruction;Functional mobility training;Therapeutic activities;Therapeutic exercise;Balance training;Neuromuscular re-education;Patient/family education    PT Next Visit Plan  checking LTGs and d/c     Consulted and Agree with Plan of Care  Patient;Family member/caregiver    Family Member Consulted  wife       Patient will benefit from skilled therapeutic intervention in order to improve the following deficits and impairments:  Abnormal gait, Decreased balance, Decreased mobility, Decreased strength, Difficulty walking, Impaired tone, Impaired  flexibility, Postural dysfunction, Pain(PT will monitor pain, but will not specifically address as a goal, as pain is longstanding due to scoliosis)  Visit Diagnosis: Other symptoms and signs involving the nervous system  Abnormal posture  Other abnormalities of gait and mobility     Problem List Patient Active Problem List   Diagnosis Date Noted  . Lumbar back pain with radiculopathy affecting right lower extremity 09/19/2016  . Elevated transaminase level 12/20/2015  . Chronic fatigue 12/02/2015  . Raynaud's phenomenon 04/16/2014  . Insomnia 03/31/2014  . Syncope 11/11/2012  . Autonomic postural hypotension 11/11/2012  . Parkinson's disease ([place]) 06/05/2012  . Restless leg syndrome 11/15/2011  . Primary hypothyroidism 09/11/2011  . Parkinsonism ([place])  04/02/2011  . Depression, major, recurrent, in partial remission ([place]) 04/02/2011    Galvin Proffer 01/15/2017, 6:54 PM  [facility] [address] [place], [place], [address] Phone: [phone]   Fax:  [phone]  Name: [name] MRN: [id number] Date of Birth: 1946-01-16

## 2017-01-17 ENCOUNTER — Ambulatory Visit: Payer: Medicare Other | Admitting: Physical Therapy

## 2017-01-17 ENCOUNTER — Ambulatory Visit: Payer: Medicare Other

## 2017-01-17 ENCOUNTER — Other Ambulatory Visit: Payer: Self-pay

## 2017-01-17 ENCOUNTER — Ambulatory Visit: Payer: Medicare Other | Admitting: Occupational Therapy

## 2017-01-17 DIAGNOSIS — R29898 Other symptoms and signs involving the musculoskeletal system: Secondary | ICD-10-CM | POA: Diagnosis not present

## 2017-01-17 DIAGNOSIS — R2689 Other abnormalities of gait and mobility: Secondary | ICD-10-CM | POA: Diagnosis not present

## 2017-01-17 DIAGNOSIS — R278 Other lack of coordination: Secondary | ICD-10-CM

## 2017-01-17 DIAGNOSIS — R293 Abnormal posture: Secondary | ICD-10-CM

## 2017-01-17 DIAGNOSIS — R2681 Unsteadiness on feet: Secondary | ICD-10-CM

## 2017-01-17 DIAGNOSIS — R1312 Dysphagia, oropharyngeal phase: Secondary | ICD-10-CM

## 2017-01-17 DIAGNOSIS — R471 Dysarthria and anarthria: Secondary | ICD-10-CM

## 2017-01-17 DIAGNOSIS — R29818 Other symptoms and signs involving the nervous system: Secondary | ICD-10-CM | POA: Diagnosis not present

## 2017-01-17 DIAGNOSIS — M6281 Muscle weakness (generalized): Secondary | ICD-10-CM | POA: Diagnosis not present

## 2017-01-17 NOTE — Patient Instructions (Signed)
Use the reminder card for your swallow strategies when you go into the restaurant.  SLP provided dys III handout via paper today.

## 2017-01-17 NOTE — Therapy (Signed)
[facility] [address] [place], [place], [address] Phone: [phone]   Fax:  [phone]  Speech Language Pathology Treatment  Patient Details  Name: [name] MRN: [id number] Date of Birth: 1945/08/17 Referring Provider: Dr. Wells Guiles Tat   Encounter Date: 01/17/2017  End of Session - 01/17/17 1450    Visit Number  11    Number of Visits  17    Date for SLP Re-Evaluation  02/02/17 changed date on 01-17-17 to 02-02-17 given pt start date two weeks from date of eval.    SLP Start Time  0805    SLP Stop Time   0847    SLP Time Calculation (min)  42 min    Activity Tolerance  Patient tolerated treatment well       Past Medical History:  Diagnosis Date  . Allergy   . Anemia   . Depression   . Parkinson's disease ([place])   . Parkinson's disease ([place]) 06/05/2012  . Raynaud's syndrome   . Thyroid disease     Past Surgical History:  Procedure Laterality Date  . APPENDECTOMY    . FRACTURE SURGERY     jaw  . HERNIA REPAIR    . MANDIBLE FRACTURE SURGERY    . ROTATOR CUFF REPAIR    . TONSILLECTOMY    . VASECTOMY    . WRIST FRACTURE SURGERY      There were no vitals filed for this visit.  Subjective Assessment - 01/17/17 0812    Subjective  "It feels like I don't have a hard swallow anymore."    Patient is accompained by:  Family member wife    Currently in Pain?  Yes    Pain Score  4     Pain Location  Hip    Pain Orientation  Right    Pain Descriptors / Indicators  Aching    Pain Type  Chronic pain    Pain Onset  More than a month ago    Pain Frequency  Constant    Aggravating Factors   lying on rt side    Pain Relieving Factors  injection Friday 01-19-17            ADULT SLP TREATMENT - 01/17/17 0836      General Information   Behavior/Cognition  Alert;Cooperative;Pleasant mood      Treatment Provided   Treatment provided  Dysphagia      Dysphagia Treatment   Temperature Spikes  Noted  No    Respiratory Status  Room air    Oral Cavity - Dentition  Adequate natural dentition    Treatment Methods  Therapeutic exercise;Compensation strategy training;Patient/caregiver education dys III     Patient observed directly with PO's  No pt did not bring food today    Other treatment/comments  Long discussion with pt/wife about dysphagia III diet. SLP provided handouts and general explanation/education on hamburger and pizza not on the "allowed" list due to crusty bread (pizza crust, brioche bun). SLP provided examples of modifications to make to make foods dys III. Pt follows precuations with occasional min A from wife. Pt reportedly has not been completing HEP at home to recommended frequency, SLP reiterated rationale for copmletion at home. SLP reminded pt/wife of recommednations from previous session with SLP re: restaurants. Pt to place notecard near his place when pt/wife go out to eat for reminder of precautions.      Assessment / Recommendations / Plan   Plan  Continue with current  plan of care      Dysphagia Recommendations   Diet recommendations  Dysphagia 3 (mechanical soft)    Liquids provided via  [employer]    Supervision  Intermittent supervision to cue for compensatory strategies    Compensations  Slow rate;Small sips/bites;Multiple dry swallows after each bite/sip;Effortful swallow;Follow solids with liquid      Progression Toward Goals   Progression toward goals  Progressing toward goals       SLP Education - 01/17/17 1449    Education provided  Yes    Education Details  dys III diet, swallow precaution use at [employer]) Educated  Patient;Spouse    Methods  Explanation;Handout;Verbal cues    Comprehension  Verbalized understanding       SLP Short Term Goals - 01/15/17 1110      SLP SHORT TERM GOAL #1   Title  Pt will average 90dB on loud "AH" with rare min A over 3 sessions.    Status  Achieved      SLP SHORT TERM GOAL #2   Title  Pt will  perform HEP for dysphagia and dysarthria with occasional min A over 3 sessions    Status  Partially Met      SLP SHORT TERM GOAL #3   Title  Pt will follow diet modificiations/swallow precautions with occasional min A over 2 sessions    Status  Achieved      SLP SHORT TERM GOAL #4   Title  Pt will average 72dB on structured speech tasks 10/12 sentences with occasional min A    Status  Partially Met      SLP SHORT TERM GOAL #5   Title  Pt will utlize compensations for dysarthria/slur at sentence level 10/12 sentences with occasional min A    Status  Achieved       SLP Long Term Goals - 01/17/17 1453      SLP LONG TERM GOAL #1   Title  Pt will average 92dB on loud "AH" over 3 sessions with rare min A    Time  4    Period  Weeks    Status  On-going      SLP LONG TERM GOAL #2   Title  Pt will complete HEP for dysphagia and dysarthria with rare min A over 2 sessions    Time  4    Period  Weeks    Status  On-going      SLP LONG TERM GOAL #3   Title  Spouse/pt will report carryover of swallow precautions/diet modifications outside of therapy with occasional min cues from wife, over 3 sessions    Status  Achieved      SLP LONG TERM GOAL #4   Title  Pt will average 70 dB over 15 minute conversation with rare min A over 3 sessions    Time  4    Period  Weeks    Status  On-going      SLP LONG TERM GOAL #5   Title  Pt will be audible and intelligible in noisy environment during 15 minute conversation over 2 sessions.    Time  4    Period  Weeks    Status  On-going       Plan - 01/17/17 1451    Clinical Impression Statement  Pt reports noncompliance with HEP frequency at home - SLP reminded pt of rationale for swallow HEP. SLP with education/discussion of dys III diet. Spouse cont to report  good carryover of precautions at home with her verbal cues. Continue skilled ST to maximize intelligibility and safety of swallow.     Speech Therapy Frequency  2x / week     Treatment/Interventions  SLP instruction and feedback;Internal/external aids;Compensatory strategies;Environmental controls;Oral motor exercises;Patient/family education;Pharyngeal strengthening exercises;Functional tasks;Language facilitation    Potential to Achieve Goals  Good    Potential Considerations  Cooperation/participation level    SLP Home Exercise Plan  Loud "AH", dysphagia HEP; dysarthria HEP    Consulted and Agree with Plan of Care  Patient;Family member/caregiver    Family Member Consulted  spouse [name]       Patient will benefit from skilled therapeutic intervention in order to improve the following deficits and impairments:   Dysphagia, oropharyngeal phase  Dysarthria and anarthria    Problem List Patient Active Problem List   Diagnosis Date Noted  . Lumbar back pain with radiculopathy affecting right lower extremity 09/19/2016  . Elevated transaminase level 12/20/2015  . Chronic fatigue 12/02/2015  . Raynaud's phenomenon 04/16/2014  . Insomnia 03/31/2014  . Syncope 11/11/2012  . Autonomic postural hypotension 11/11/2012  . Parkinson's disease ([place]) 06/05/2012  . Restless leg syndrome 11/15/2011  . Primary hypothyroidism 09/11/2011  . Parkinsonism ([place]) 04/02/2011  . Depression, major, recurrent, in partial remission ([place]) 04/02/2011    [facility], [place]  01/17/2017, 2:55 PM  [facility] [address] [place], [place], [address] Phone: [phone]   Fax:  [phone]   Name: Vinton Layson MRN: [id number] Date of Birth: [birth year]

## 2017-01-17 NOTE — Therapy (Signed)
[facility] [address] [place], [place], [address] Phone: [phone]   Fax:  [phone]  Physical Therapy Treatment  Patient Details  Name: [name] MRN: [id number] Date of Birth: 1945/11/24 Referring Provider: Alonza Bogus, DO   Encounter Date: 01/17/2017  PT End of Session - 01/17/17 2035    Visit Number  16 next g-code on visit 20    Number of Visits  17    Date for PT Re-Evaluation  01/19/17    Authorization Type  Medicare, UHC    PT Start Time  0940    PT Stop Time  1013    PT Time Calculation (min)  33 min    Activity Tolerance  Patient tolerated treatment well    Behavior During Therapy  [facility] for tasks assessed/performed       Past Medical History:  Diagnosis Date  . Allergy   . Anemia   . Depression   . Parkinson's disease ([place])   . Parkinson's disease ([place]) 06/05/2012  . Raynaud's syndrome   . Thyroid disease     Past Surgical History:  Procedure Laterality Date  . APPENDECTOMY    . FRACTURE SURGERY     jaw  . HERNIA REPAIR    . MANDIBLE FRACTURE SURGERY    . ROTATOR CUFF REPAIR    . TONSILLECTOMY    . VASECTOMY    . WRIST FRACTURE SURGERY      There were no vitals filed for this visit.  Subjective Assessment - 01/17/17 1928    Subjective  Plan to start with the exercise class.  Ready for a break from therapy.    Patient is accompained by:  Family member    Pertinent History  anemia, depression, Parkinson's disease dx 2009, R foot fracture [facility] Orthopedic PT for foot fracture in March 2018); R RTC repair, scoliosis (not a surgical candidate)    Patient Stated Goals  Pt's goals for therapy include exercises for back, help with more mobility in R hand; be able to stand up straighter and walk better (per wife)    Currently in Pain?  Yes    Pain Score  4     Pain Location  Hip    Pain Orientation  Right    Pain Descriptors / Indicators  Aching    Pain Type  Chronic pain     Pain Radiating Towards  radiating into R thigh towards knee today    Pain Onset  More than a month ago    Pain Frequency  Constant    Aggravating Factors   lying on R side    Pain Relieving Factors  exercise, walking around                      [facility] Adult PT Treatment/Exercise - 01/17/17 0952      Transfers   Transfers  Sit to Stand;Stand to Sit    Sit to Stand  6: Modified independent (Device/Increase time);4: Min guard;Without upper extremity assist    Five time sit to stand comments   12.5     Stand to Sit  6: Modified independent (Device/Increase time)    Comments  5 reps from mat, 10 reps from 17" chair, 8 reps from 16" chair; VCs for rocking, increased forward lean      Ambulation/Gait   Ambulation/Gait  Yes    Ambulation/Gait Assistance  6: Modified independent (Device/Increase time)    Ambulation Distance (Feet)  200  Feet    Assistive device  None    Gait Pattern  Step-through pattern;Decreased trunk rotation;Trunk rotated posteriorly on right;Trunk flexed    Ambulation Surface  Indoor    Gait velocity  3.59 ft/sec      Standardized Balance Assessment   Standardized Balance Assessment  Timed Up and Go Test      Timed Up and Go Test   TUG  Normal TUG;Cognitive TUG    Normal TUG (seconds)  9.81    Cognitive TUG (seconds)  10.03      Self-Care   Self-Care  Other Self-Care Comments    Other Self-Care Comments   Reviewed plans for community fitness, including pt planning to come to [facility] Moves exercise classes, but unsure if he will wait until after the holidays.  Reviewed fall prevention information given last visit; discussed d/c plans and plans for return screen vs. eval             PT Education - 01/17/17 2035    Education provided  Yes    Education Details  review of fall prevention, community fitness/importance of continued exercise and HEP performance; plans for d/c-return PT screen vs eval    Person(s) Educated  Patient;Spouse    Methods   Explanation    Comprehension  Verbalized understanding       PT Short Term Goals - 12/25/16 0957      PT SHORT TERM GOAL #1   Title  Pt will perform HEP with wife's supervision, to address Parkinson-specific deficits.  TARGET 12/22/16    Time  5    Period  Weeks    Status  Achieved      PT SHORT TERM GOAL #2   Title  Pt will improve 5x sit<>stand to less than or equal to 15 seconds for improved efficiency and safety with transfers.    Baseline  11/5 15.37 sec    Time  5    Period  Weeks    Status  Partially Met      PT SHORT TERM GOAL #3   Title  Pt will improve TUG score to less than or equal to 13.5 seconds for decreased fall risk.    Baseline  11/5 7.97 sec    Time  5    Period  Weeks    Status  Achieved      PT SHORT TERM GOAL #4   Title  Pt will improve DGI score to at least 15/24 for decreased fall risk.    Baseline  11/5 18/24    Time  5    Period  Weeks    Status  Achieved      PT SHORT TERM GOAL #5   Title  Pt/wife will verbalize understanding of local Parkinson's disease resources.    Baseline  11/5 pt did not recall receiving resource information; wife thought they had    Time  5    Period  Weeks    Status  Achieved        PT Long Term Goals - 01/17/17 0946      PT LONG TERM GOAL #1   Title  Pt/wife will verbalize understanding of fall prevention in the home environment.  TARGET 01/19/17    Baseline  11/26 information provided    Time  9    Period  Weeks    Status  Achieved      PT LONG TERM GOAL #2   Title  Pt will perform sit<>stand from surfaces lower  than 18", minimal UE support, at least 6 of 10 reps, modified independently, for improved car and low surface transfers.    Baseline  Requires occasional UE support    Time  9    Period  Weeks    Status  Achieved      PT LONG TERM GOAL #3   Title  Pt will improve TUG cognitive score to less tahn or equal to 15 seconds for decreased fall risk.    Baseline  9.81 TUG, 10.03 TUG cog    Time  9     Period  Weeks    Status  Achieved      PT LONG TERM GOAL #4   Title  Pt will improve DGI score to at least 19/24 for decreased fall risk.    Baseline  11/26  21/24    Time  9    Period  Weeks    Status  Achieved      PT LONG TERM GOAL #5   Title  Pt will verbalize plans for continued community fitness upon d/c from PT.    Time  9    Period  Weeks    Status  Achieved            Plan - 02-05-17 Apr 10, 2034    Clinical Impression Statement  Assessed remaining LTGs, with pt meeting all LTGs, 1-5.  Pt has demonstrated improved gait velocity, TUG scores and dynamic gait and balance, improved postural awareness.  Pt is appropriate for discharge at this time.    Rehab Potential  Good    Clinical Impairments Affecting Rehab Potential  scoliosis (not a surgical candidate) with chronic back pain    PT Frequency  2x / week 1x/wk for 1 week, then 2x/wk for 8 weeks    PT Duration  Other (comment) total POC = 9 weeks    PT Treatment/Interventions  ADLs/Self Care Home Management;Gait training;DME Instruction;Functional mobility training;Therapeutic activities;Therapeutic exercise;Balance training;Neuromuscular re-education;Patient/family education    PT Next Visit Plan  Discharge this visit    Consulted and Agree with Plan of Care  Patient;Family member/caregiver    Family Member Consulted  wife       Patient will benefit from skilled therapeutic intervention in order to improve the following deficits and impairments:  Abnormal gait, Decreased balance, Decreased mobility, Decreased strength, Difficulty walking, Impaired tone, Impaired flexibility, Postural dysfunction, Pain(PT will monitor pain, but will not specifically address as a goal, as pain is longstanding due to scoliosis)  Visit Diagnosis: Other abnormalities of gait and mobility  Unsteadiness on feet  Abnormal posture   G-Codes - 2017/02/05 10-Apr-2037    Functional Assessment Tool Used (Outpatient Only)  5x:  12.5 sec, TUG 9.81 sec, TUG cog  10.03 sec, DGI 21/24, gait velocity 3.59 ft/sec    Functional Limitation  Mobility: Walking and moving around    Mobility: Walking and Moving Around Goal Status [phone])  At least 20 percent but less than 40 percent impaired, limited or restricted    Mobility: Walking and Moving Around Discharge Status [phone])  At least 20 percent but less than 40 percent impaired, limited or restricted       Problem List Patient Active Problem List   Diagnosis Date Noted  . Lumbar back pain with radiculopathy affecting right lower extremity 09/19/2016  . Elevated transaminase level 12/20/2015  . Chronic fatigue 12/02/2015  . Raynaud's phenomenon 04/16/2014  . Insomnia 03/31/2014  . Syncope 11/11/2012  . Autonomic postural hypotension 11/11/2012  . Parkinson's  disease ([place]) 06/05/2012  . Restless leg syndrome 11/15/2011  . Primary hypothyroidism 09/11/2011  . Parkinsonism ([place]) 04/02/2011  . Depression, major, recurrent, in partial remission ([place]) 04/02/2011    Janice Seales W. 01/17/2017, 8:42 PM  Frazier Butt., PT   [facility] [address] [place], [place], [address] Phone: [phone]   Fax:  [phone]  Name: [name] MRN: [id number] Date of Birth: Jan 16, 1946   PHYSICAL THERAPY DISCHARGE SUMMARY  Visits from Start of Care: 16  Current functional level related to goals / functional outcomes: PT Long Term Goals - 01/17/17 0946      PT LONG TERM GOAL #1   Title  Pt/wife will verbalize understanding of fall prevention in the home environment.  TARGET 01/19/17    Baseline  11/26 information provided    Time  9    Period  Weeks    Status  Achieved      PT LONG TERM GOAL #2   Title  Pt will perform sit<>stand from surfaces lower than 18", minimal UE support, at least 6 of 10 reps, modified independently, for improved car and low surface transfers.    Baseline  Requires occasional UE support    Time  9     Period  Weeks    Status  Achieved      PT LONG TERM GOAL #3   Title  Pt will improve TUG cognitive score to less tahn or equal to 15 seconds for decreased fall risk.    Baseline  9.81 TUG, 10.03 TUG cog    Time  9    Period  Weeks    Status  Achieved      PT LONG TERM GOAL #4   Title  Pt will improve DGI score to at least 19/24 for decreased fall risk.    Baseline  11/26  21/24    Time  9    Period  Weeks    Status  Achieved      PT LONG TERM GOAL #5   Title  Pt will verbalize plans for continued community fitness upon d/c from PT.    Time  9    Period  Weeks    Status  Achieved      Pt has met all 5 LTGs   Remaining deficits: Posture, gait, high level balance   Education / Equipment: Educated in [employer], fall prevention,and continued community fitness options.  Plan: Patient agrees to discharge.  Patient goals were not met. Patient is being discharged due to meeting the stated rehab goals.  ?????Pt would benefit from return PT screen/PT eval in 6-9 months due to progressive nature of disease process.         Mady Haagensen, PT 01/17/17 8:45 PM Phone: [phone] Fax: [phone]

## 2017-01-17 NOTE — Therapy (Signed)
[facility] [address] [place], [place], [address] Phone: [phone]   Fax:  [phone]  Occupational Therapy Treatment  Patient Details  Name: [name] MRN: [id number] Date of Birth: [birth year] Referring Provider: Dr. Carles Collet   Encounter Date: 01/17/2017  OT End of Session - 01/17/17 1100    Visit Number  16    Number of Visits  17    Date for OT Re-Evaluation  01/19/17    Authorization Type  MCR Primary - G code needed, UHC secondary    Authorization - Visit Number  16    Authorization - Number of Visits  20    OT Start Time  1020    OT Stop Time  1100    OT Time Calculation (min)  40 min    Activity Tolerance  Patient tolerated treatment well       Past Medical History:  Diagnosis Date  . Allergy   . Anemia   . Depression   . Parkinson's disease ([place])   . Parkinson's disease ([place]) 06/05/2012  . Raynaud's syndrome   . Thyroid disease     Past Surgical History:  Procedure Laterality Date  . APPENDECTOMY    . FRACTURE SURGERY     jaw  . HERNIA REPAIR    . MANDIBLE FRACTURE SURGERY    . ROTATOR CUFF REPAIR    . TONSILLECTOMY    . VASECTOMY    . WRIST FRACTURE SURGERY      There were no vitals filed for this visit.  Subjective Assessment - 01/17/17 1025    Pertinent History  PD since 2009, spinal stenosis, scoliosis, HOH, Raynauds, h/o falls with radius fx in 2011?, Rt ankle fx 2017, Rt rotator cuff surgery 2003    Limitations  fall risk, limited driving    Patient Stated Goals  put on my watch    Currently in Pain?  Yes    Pain Score  4     Pain Location  Leg    Pain Orientation  Right    Pain Descriptors / Indicators  Aching    Pain Type  Chronic pain    Pain Onset  More than a month ago    Pain Frequency  Constant    Aggravating Factors   lying on Rt side    Pain Relieving Factors  exercise, walking around         [facility] OT Assessment - 01/17/17 0001      Observation/Other  Assessments   Simulated Eating Time (seconds)  10 sec    Donning Doffing Jacket Time (seconds)  12 sec.     Donning Doffing Jacket Comments  3 button/unbutton test: 27.31 sec. (while standing with his shirt on; unable to do on tabletop)      Coordination   Right 9 Hole Peg Test  22.78 sec               OT Treatments/Exercises (OP) - 01/17/17 1041      ADLs   ADL Comments  Reassessed objective measures - see assessment section      Exercises   Exercises  -- UBE x 5 min. keeping RPM >40      Fine Motor Coordination   Other Fine Motor Exercises  Reviewed coordination HEP with cues for large amplitude movements (especially for rotating ball in fingertips) and dealing cards with thumb               OT Short Term  Goals - 01/17/17 1113      OT SHORT TERM GOAL #1   Title  Pt/family will be independent with PD specific HEP including PWR! moves and coordination HEP     Time  4    Period  Weeks    Status  Achieved      OT SHORT TERM GOAL #2   Title  Pt will write at least 3 sentences with no significant decr in size and 95% legibility or greater     Time  4    Period  Weeks    Status  Achieved      OT SHORT TERM GOAL #3   Title  Pt will perform PPT #2 test in 12 sec. or under    Baseline  eval = 15.78 sec    Time  4    Period  Weeks    Status  Achieved 12/20/16 = 8.59 sec, 01/17/17 = 10 sec.       OT SHORT TERM GOAL #4   Title  Pt will perform PPT #4 test in 22 sec. or under    Baseline  eval = 25.29 sec    Time  4    Period  Weeks    Status  Achieved 12/20/16 = 16 sec., 01/17/17 = 12 sec.       OT SHORT TERM GOAL #5   Title  Pt will increase coordination Rt hand as evidenced by performing 9 hole peg test in 32 sec. or less    Baseline  eval = 36.90 sec    Time  4    Period  Weeks    Status  Achieved Rt = 18.25 sec., 01/17/17 = 22.78 sec.       OT SHORT TERM GOAL #6   Title  Pt will increase RUE function as evidenced by performing 34 blocks or greater  on Box & Blocks test    Baseline  eval = 30 (Lt = 48)     Time  4    Period  Weeks    Status  Achieved Rt = 49 sec        OT Long Term Goals - 01/17/17 1114      OT LONG TERM GOAL #1   Title  Pt will verbalize understanding of ways to prevent future PD related complications and appropriate community and national resources prn    Time  8    Period  Weeks    Status  Achieved      OT LONG TERM GOAL #2   Title  Pt will verbalize understanding of adaptive strategies to incr ease with ADLS/IADLS    Time  8    Period  Weeks    Status  Achieved      OT LONG TERM GOAL #3   Title  Pt will perform PPT #4 in 19 sec. or under    Baseline  eval = 25.29 sec    Time  8    Period  Weeks    Status  Achieved      OT LONG TERM GOAL #4   Title  Pt will improve functional coordination as evidenced by reducing time on 3 button test to 40 sec. or less    Baseline  59.01 sec. (standing with shirt on)     Time  8    Period  Weeks    Status  Achieved 01/17/17 = 27.31 sec.       OT LONG TERM GOAL #5  Title  Pt will verbalize understanding of ways to keep thinking skills sharp    Time  8    Period  Weeks    Status  Achieved      OT LONG TERM GOAL #6   Title  Pt to report using RUE as dominant UE for ADLS/IADLS 33% of the time    Time  8    Period  Weeks    Status  Achieved 12/25/16            Plan - 01/17/17 1114    Clinical Impression Statement  Pt has met all STG's and LTG's and improved significantly in all objective measures and functional tasks.     Rehab Potential  Good    OT Treatment/Interventions  Self-care/ADL training;DME and/or AE instruction;Patient/family education;Splinting;Therapeutic exercises;Therapeutic activities;Neuromuscular education;Functional Mobility Training;Passive range of motion;Cognitive remediation/compensation;Visual/perceptual remediation/compensation;Manual Therapy    Plan  D/C O.T. Pt to return in 6 months for screening    OT Home Exercise Plan   11/27/16: PWR! Seated, POP info, national websites. 11/29/16: PWR! Hands, tips for constipation. 12/05/16: PWR! Supine. 12/07/16: coordination HEP. 12/11/16: Writing strategies. 12/13/16: movement strategies for ADLS. 12/27/16: memory strategies/ways to keep thinking skills sharp    Consulted and Agree with Plan of Care  Patient;Family member/caregiver    Family Member Consulted  Wife       Patient will benefit from skilled therapeutic intervention in order to improve the following deficits and impairments:  Decreased coordination, Impaired flexibility, Improper body mechanics, Improper spinal/pelvic alignment, Decreased safety awareness, Decreased endurance, Decreased knowledge of precautions, Impaired tone, Impaired UE functional use, Pain, Decreased knowledge of use of DME, Decreased cognition, Decreased mobility, Decreased strength, Impaired perceived functional ability, Impaired vision/preception  Visit Diagnosis: Other symptoms and signs involving the nervous system  Other symptoms and signs involving the musculoskeletal system  Other lack of coordination  Abnormal posture  Muscle weakness (generalized)    Problem List Patient Active Problem List   Diagnosis Date Noted  . Lumbar back pain with radiculopathy affecting right lower extremity 09/19/2016  . Elevated transaminase level 12/20/2015  . Chronic fatigue 12/02/2015  . Raynaud's phenomenon 04/16/2014  . Insomnia 03/31/2014  . Syncope 11/11/2012  . Autonomic postural hypotension 11/11/2012  . Parkinson's disease ([place]) 06/05/2012  . Restless leg syndrome 11/15/2011  . Primary hypothyroidism 09/11/2011  . Parkinsonism ([place]) 04/02/2011  . Depression, major, recurrent, in partial remission ([place]) 04/02/2011   OCCUPATIONAL THERAPY DISCHARGE SUMMARY  Visits from Start of Care: 16  Current functional level related to goals / functional outcomes: SEE ABOVE - PT MET ALL GOALS   Remaining  deficits: Bradykinesia Dystonia Difficulty with dual tasking   Education / Equipment: Pt provided with community and national resources for PD, community fitness options re: PD, PWR! Moves and coordination HEP, large movement strategies for ADLS and to prevent future complications, ways to keep thinking skills sharp Plan: Patient agrees to discharge.  Patient goals were met. Patient is being discharged due to meeting the stated rehab goals.  ?????        Carey Bullocks, OTR/L 01/17/2017, 11:16 AM  [facility] [address] [place], [place], [address] Phone: [phone]   Fax:  [phone]  Name: [name] MRN: [id number] Date of Birth: 20-Sep-1945

## 2017-01-19 DIAGNOSIS — M5416 Radiculopathy, lumbar region: Secondary | ICD-10-CM | POA: Diagnosis not present

## 2017-01-22 ENCOUNTER — Other Ambulatory Visit: Payer: Self-pay | Admitting: Physician Assistant

## 2017-01-22 ENCOUNTER — Ambulatory Visit (INDEPENDENT_AMBULATORY_CARE_PROVIDER_SITE_OTHER): Payer: Medicare Other | Admitting: Physician Assistant

## 2017-01-22 ENCOUNTER — Ambulatory Visit (INDEPENDENT_AMBULATORY_CARE_PROVIDER_SITE_OTHER): Payer: Medicare Other

## 2017-01-22 ENCOUNTER — Ambulatory Visit: Payer: Medicare Other | Admitting: Family Medicine

## 2017-01-22 ENCOUNTER — Other Ambulatory Visit: Payer: Self-pay

## 2017-01-22 ENCOUNTER — Encounter: Payer: Self-pay | Admitting: Physician Assistant

## 2017-01-22 VITALS — BP 128/82 | HR 74 | Temp 99.4°F | Resp 16 | Ht 69.0 in | Wt 121.0 lb

## 2017-01-22 DIAGNOSIS — R058 Other specified cough: Secondary | ICD-10-CM

## 2017-01-22 DIAGNOSIS — R1310 Dysphagia, unspecified: Secondary | ICD-10-CM | POA: Diagnosis not present

## 2017-01-22 DIAGNOSIS — R05 Cough: Secondary | ICD-10-CM

## 2017-01-22 DIAGNOSIS — M79632 Pain in left forearm: Secondary | ICD-10-CM

## 2017-01-22 DIAGNOSIS — F458 Other somatoform disorders: Secondary | ICD-10-CM

## 2017-01-22 DIAGNOSIS — R09A2 Foreign body sensation, throat: Secondary | ICD-10-CM

## 2017-01-22 DIAGNOSIS — M4802 Spinal stenosis, cervical region: Secondary | ICD-10-CM | POA: Diagnosis not present

## 2017-01-22 DIAGNOSIS — R0989 Other specified symptoms and signs involving the circulatory and respiratory systems: Secondary | ICD-10-CM

## 2017-01-22 DIAGNOSIS — S59912A Unspecified injury of left forearm, initial encounter: Secondary | ICD-10-CM | POA: Diagnosis not present

## 2017-01-22 MED ORDER — OMEPRAZOLE 20 MG PO CPDR: 20.0000 mg | DELAYED_RELEASE_CAPSULE | Freq: Every day | ORAL | 1 refills | Status: DC

## 2017-01-22 NOTE — Progress Notes (Signed)
PRIMARY CARE AT [place], [place] [address] [phone]  Date:  01/22/2017   Name:  [name]   DOB:  12/21/1945   MRN:  [id number]  PCP:  [place], Betty G, MD    History of Present Illness:  [name] is a 71 y.o. male patient who presents to PCP with  Chief Complaint  Patient presents with  . Cough    productive, yellow mucus/ x 10 days.      He is having progressively worsening trouble with swallowing over the last 10 days.   This can be solids mostly.   He generally struggles with dysphagia secondary to parkinson's disease.  He and his wife note that this is worsened in the last 10 days.  There is no pain with swallowing.  He does have a globus sensation.  He will choke on the food and cough it up.  Feels like it is going down the wrong windpipe.   He has noted increased coughing of a productive phlegm.  No fever.  No hemoptysis.  No sob or dyspnea.  No dizziness He is in occupational therapy for the dysphagia.  He attempts to chew small portions then swallow repetitiously, drink water, then swallow again.  He has been eating pasta, with alfredo sauce and chicken biscuits. Currently, patient is not taking any reflux medications. Last DG OP Swallowing function 02/24/2016 IMPRESSION: Small amount of penetration with thin liquid. Small amount of silent aspiration also with thin liquids.    Patient Active Problem List   Diagnosis Date Noted  . Lumbar back pain with radiculopathy affecting right lower extremity 09/19/2016  . Elevated transaminase level 12/20/2015  . Chronic fatigue 12/02/2015  . Raynaud's phenomenon 04/16/2014  . Insomnia 03/31/2014  . Syncope 11/11/2012  . Autonomic postural hypotension 11/11/2012  . Parkinson's disease ([place]) 06/05/2012  . Restless leg syndrome 11/15/2011  . Primary hypothyroidism 09/11/2011  . Parkinsonism ([place]) 04/02/2011  . Depression, major, recurrent, in partial remission ([place]) 04/02/2011    Past  Medical History:  Diagnosis Date  . Allergy   . Anemia   . Depression   . Parkinson's disease ([place])   . Parkinson's disease ([place]) 06/05/2012  . Raynaud's syndrome   . Thyroid disease     Past Surgical History:  Procedure Laterality Date  . APPENDECTOMY    . FRACTURE SURGERY     jaw  . HERNIA REPAIR    . MANDIBLE FRACTURE SURGERY    . ROTATOR CUFF REPAIR    . TONSILLECTOMY    . VASECTOMY    . WRIST FRACTURE SURGERY      Social History   Tobacco Use  . Smoking status: Never Smoker  . Smokeless tobacco: Never Used  Substance Use Topics  . Alcohol use: Yes    Alcohol/week: 0.0 - 4.2 oz    Comment: 1-2 beers/weeks  . Drug use: No    Family History  Problem Relation Age of Onset  . Colon cancer Father   . Heart disease Mother   . Huntington's disease Maternal Aunt   . Huntington's disease Maternal Uncle   . Multiple sclerosis Maternal Uncle     Allergies  Allergen Reactions  . Lamictal [Lamotrigine] Other (See Comments)    Blisters in mouth  . Nsaids Other (See Comments)    Bleeding in stool   . Penicillins Rash    .Marland KitchenHas patient had a PCN reaction causing immediate rash, facial/tongue/throat swelling, SOB or lightheadedness with hypotension: No Has  patient had a PCN reaction causing severe rash involving mucus membranes or skin necrosis: No Has patient had a PCN reaction that required hospitalization No Has patient had a PCN reaction occurring within the last 10 years: No If all of the above answers are "NO", then may proceed with Cephalosporin use.     Medication list has been reviewed and updated.  Current Outpatient Medications on File Prior to Visit  Medication Sig Dispense Refill  . aspirin 81 MG tablet Take 160 mg by mouth daily.    . Biotin 5000 MCG TABS Take 1 tablet by mouth daily.    . carbidopa-levodopa (SINEMET CR) 50-200 MG tablet Take 1 tablet by mouth at bedtime. 90 tablet 1  . carbidopa-levodopa (SINEMET IR) 25-100 MG tablet Take 1 tablet by  mouth 4 (four) times daily.    Marland Kitchen levothyroxine (SYNTHROID, LEVOTHROID) 100 MCG tablet Take 1 tablet (100 mcg total) by mouth daily. 90 tablet 2  . Multiple Vitamins-Minerals (MULTIVITAMIN WITH MINERALS) tablet Take 1 tablet by mouth daily.    . rasagiline (AZILECT) 0.5 MG TABS tablet Take 0.5 mg by mouth daily.    Marland Kitchen rOPINIRole (REQUIP) 2 MG tablet TAKE 1/2 TABLET BY MOUTH AT 6:30 AM, 1/2 TABLET AT NOON, 1/2 TABLET AT 5:00 PM, AND 1 AND 1/2 TABLETS AT 8:00 PM 270 tablet 3  . omeprazole (PRILOSEC) 20 MG capsule TAKE 1 CAPSULE(20 MG) BY MOUTH TWICE DAILY BEFORE A MEAL (Patient not taking: Reported on 11/22/2016) 180 capsule 0   Current Facility-Administered Medications on File Prior to Visit  Medication Dose Route Frequency Provider Last Rate Last Dose  . cyanocobalamin ((VITAMIN B-12)) injection 1,000 mcg  1,000 mcg Intramuscular Q30 days Orma Flaming, MD   1,000 mcg at 07/28/14 1344    ROS ROS otherwise unremarkable unless listed above.  Physical Examination: BP 128/82   Pulse 74   Temp 99.4 F (37.4 C) (Oral)   Resp 16   Ht 5\' 9"  (1.753 m)   Wt 121 lb (54.9 kg)   SpO2 97%   BMI 17.87 kg/m  Ideal Body Weight: Weight in (lb) to have BMI = 25: 168.9  Physical Exam  Constitutional: He is oriented to person, place, and time. He appears well-developed and well-nourished. No distress.  HENT:  Head: Normocephalic and atraumatic.  Eyes: Conjunctivae and EOM are normal. Pupils are equal, round, and reactive to light.  Cardiovascular: Normal rate and regular rhythm. Exam reveals no friction rub.  No murmur heard. Pulmonary/Chest: Effort normal. No respiratory distress.  Neurological: He is alert and oriented to person, place, and time.  Skin: Skin is warm and dry. He is not diaphoretic.  Psychiatric: He has a normal mood and affect. His behavior is normal.   10/30/2016 128lb 09/19/2016 127lb 03/16/2016 131lb  Dg Neck Soft Tissue  Result Date: 01/22/2017 CLINICAL DATA:  Productive  cough.  Globus sensation. EXAM: NECK SOFT TISSUES - 1+ VIEW COMPARISON:  Radiographs dated 11/16/2016 FINDINGS: There is no evidence of retropharyngeal soft tissue swelling or epiglottic enlargement. The cervical airway is unremarkable and no radio-opaque foreign body identified. There is slight disc space narrowing at C3-4, C5-6, and C6-7. Arytenoid calcifications noted. IMPRESSION: No acute abnormality.  Degenerative disc disease. Electronically Signed   By: Lorriane Shire M.D.   On: 01/22/2017 17:05   Dg Chest 2 View  Result Date: 01/22/2017 CLINICAL DATA:  Productive cough. EXAM: CHEST  2 VIEW COMPARISON:  10/30/2016 FINDINGS: Lungs are hyperexpanded. The lungs are clear without focal  pneumonia, edema, pneumothorax or pleural effusion. The cardiopericardial silhouette is within normal limits for size. Bones are diffusely demineralized. IMPRESSION: No active cardiopulmonary disease. Electronically Signed   By: Misty Stanley M.D.   On: 01/22/2017 17:13   Dg Forearm Left  Result Date: 01/22/2017 CLINICAL DATA:  Left forearm pain since a fall 2 weeks ago. Remote history of ulna fracture treated with open reduction and internal fixation. Remote distal radius fracture. EXAM: LEFT FOREARM - 2 VIEW COMPARISON:  Radiographs dated 01/31/2010 and 11/30/2009 FINDINGS: There are old slight deformities of the distal radius and of the distal shaft of the ulna. Side plate and multiple screws are in place in the distal ulnar shaft. The prior fracture has completely healed. There is minimal spurring on the coronoid process of the ulna. No appreciable joint effusions.  No acute bone abnormalities. IMPRESSION: No acute abnormalities of the left forearm. Old posttraumatic and degenerative changes as described. Electronically Signed   By: Lorriane Shire M.D.   On: 01/22/2017 16:55   Weight: 121 lb (54.9 kg)    Assessment and Plan: [name] is a 71 y.o. male who is here today for cc of  Chief Complaint   Patient presents with  . Cough    productive, yellow mucus/ x 10 days.    --we will obtain the swallowing testing again.  This appears to be slight decline, but I have advised to follow the diet soft and wet foods that have been advised.   --also advised to restart prilosec at this time Dysphagia, unspecified type - Plan: DG Chest 2 View, DG Neck Soft Tissue, CBC, DG OP Swallowing Func-Medicare/Speech Path, omeprazole (PRILOSEC) 20 MG capsule  Productive cough - Plan: DG Chest 2 View, DG Neck Soft Tissue, CBC, DG OP Swallowing Func-Medicare/Speech Path, omeprazole (PRILOSEC) 20 MG capsule  Left forearm pain - Plan: DG Forearm Left, DG OP Swallowing Func-Medicare/Speech Path  Globus sensation - Plan: omeprazole (PRILOSEC) 20 MG capsule  Ivar Drape, PA-C [facility] 12/6/20181:15 PM

## 2017-01-22 NOTE — Patient Instructions (Addendum)
I would like you to restart the omeprazole at this time.  This can opened and placed in softer foods. I would like you to observe for any fever, extra cough I do think it is time for reassessment of your swallowing functioning.  We are reordering this at this time.  Please await contact for this reassessment.     IF you received an x-ray today, you will receive an invoice from [facility]. Please contact [facility] at [phone] with questions or concerns regarding your invoice.   IF you received labwork today, you will receive an invoice from [facility]. Please contact [facility] at [phone] with questions or concerns regarding your invoice.   Our billing staff will not be able to assist you with questions regarding bills from these companies.  You will be contacted with the lab results as soon as they are available. The fastest way to get your results is to activate your My Chart account. Instructions are located on the last page of this paperwork. If you have not heard from [place] regarding the results in 2 weeks, please contact this office.

## 2017-01-23 ENCOUNTER — Ambulatory Visit: Payer: Medicare Other | Admitting: Speech Pathology

## 2017-01-23 LAB — CBC
Hematocrit: 40.4 % (ref 37.5–51.0)
Hemoglobin: 13.7 g/dL (ref 13.0–17.7)
MCH: 29.5 pg (ref 26.6–33.0)
MCHC: 33.9 g/dL (ref 31.5–35.7)
MCV: 87 fL (ref 79–97)
PLATELETS: 235 10*3/uL (ref 150–379)
RBC: 4.65 x10E6/uL (ref 4.14–5.80)
RDW: 15 % (ref 12.3–15.4)
WBC: 8.1 10*3/uL (ref 3.4–10.8)

## 2017-01-24 ENCOUNTER — Ambulatory Visit: Payer: Medicare Other | Attending: Neurology | Admitting: Speech Pathology

## 2017-01-24 DIAGNOSIS — R471 Dysarthria and anarthria: Secondary | ICD-10-CM | POA: Diagnosis not present

## 2017-01-24 DIAGNOSIS — R1312 Dysphagia, oropharyngeal phase: Secondary | ICD-10-CM

## 2017-01-24 NOTE — Therapy (Signed)
[facility] [address] [place], [place], [address] Phone: [phone]   Fax:  [phone]  Speech Language Pathology Treatment  Patient Details  Name: [name] MRN: [id number] Date of Birth: 03/07/1945 Referring Provider: Dr. Wells Guiles Tat   Encounter Date: 01/24/2017  End of Session - 01/24/17 1729    Visit Number  12    Number of Visits  17    Date for SLP Re-Evaluation  02/02/17    SLP Start Time  0847    SLP Stop Time   0933    SLP Time Calculation (min)  46 min    Activity Tolerance  Patient tolerated treatment well       Past Medical History:  Diagnosis Date  . Allergy   . Anemia   . Depression   . Parkinson's disease ([place])   . Parkinson's disease ([place]) 06/05/2012  . Raynaud's syndrome   . Thyroid disease     Past Surgical History:  Procedure Laterality Date  . APPENDECTOMY    . FRACTURE SURGERY     jaw  . HERNIA REPAIR    . MANDIBLE FRACTURE SURGERY    . ROTATOR CUFF REPAIR    . TONSILLECTOMY    . VASECTOMY    . WRIST FRACTURE SURGERY      There were no vitals filed for this visit.  Subjective Assessment - 01/24/17 0855    Subjective  Pt saw NP 2 days ago for productive cough, had CXR. MBS ordered.    Patient is accompained by:  Family member    Special Tests  spouse, [name]            ADULT SLP TREATMENT - 01/24/17 0847      General Information   Behavior/Cognition  Alert;Cooperative;Pleasant mood    Patient Positioning  Upright in chair      Treatment Provided   Treatment provided  Dysphagia      Dysphagia Treatment   Temperature Spikes Noted  Yes low grade, 99 per wife    Respiratory Status  Room air    Oral Cavity - Dentition  Adequate natural dentition    Treatment Methods  Therapeutic exercise;Compensation strategy training;Patient/caregiver education    Patient observed directly with PO's  Yes    Type of PO's observed  Dysphagia 3 (soft);Thin liquids    Feeding  Able to feed self    Liquids provided via  Cup    Pharyngeal Phase Signs & Symptoms  Delayed throat clear;Immediate cough;Delayed cough;Complaints of residue;Immediate throat clear complaints of residue, suspect nasal regurgitation    Other treatment/comments  SLP praised pt and his wife for quick f/u with MD when they noted s/sx of pneumonia. Per NP no pneumonia, however MBS ordered. SLP assisted pt with scheduling OP MBS. Pt recalled swallow precautions however required min A initially fading to independence to implement strategies with dys 3/ thin liquids. Nasal emissions noted especially when swallowing liquids; when questioned pt does report sensation of nasal reguritation. Initially no overt signs of aspiration, however pt began coughing after solid approx 6 minutes into trials, and was noted with subsequent coughing after sips of thin liquids. SLP provided diagnostic trials of nectar thick liquids, which did not reduce coughing.  Extensive education to pt and wife re: importance of regular completion of HEP if hopeful to improve/prevent further decline of swallow function.       Pain Assessment   Pain Assessment  No/denies pain      Assessment /  Recommendations / Plan   Plan  Continue with current plan of care      Dysphagia Recommendations   Diet recommendations  Dysphagia 3 (mechanical soft)    Liquids provided via  Cup    Supervision  Intermittent supervision to cue for compensatory strategies    Compensations  Slow rate;Small sips/bites;Multiple dry swallows after each bite/sip;Effortful swallow;Follow solids with liquid;Clear throat intermittently effortful swallow    Postural Changes and/or Swallow Maneuvers  Seated upright 90 degrees      Progression Toward Goals   Progression toward goals  Progressing toward goals       SLP Education - 01/24/17 1725    Education provided  Yes    Education Details  frequency of HEP, swallow precautions    Person(s) Educated   Patient;Spouse    Comprehension  Verbalized understanding;Need further instruction       SLP Short Term Goals - 01/24/17 1052      SLP SHORT TERM GOAL #1   Title  Pt will average 90dB on loud "AH" with rare min A over 3 sessions.    Status  Achieved      SLP SHORT TERM GOAL #2   Title  Pt will perform HEP for dysphagia and dysarthria with occasional min A over 3 sessions    Status  Partially Met      SLP SHORT TERM GOAL #3   Title  Pt will follow diet modificiations/swallow precautions with occasional min A over 2 sessions    Status  Achieved      SLP SHORT TERM GOAL #4   Title  Pt will average 72dB on structured speech tasks 10/12 sentences with occasional min A    Status  Partially Met      SLP SHORT TERM GOAL #5   Title  Pt will utlize compensations for dysarthria/slur at sentence level 10/12 sentences with occasional min A    Status  Achieved       SLP Long Term Goals - 01/24/17 1051      SLP LONG TERM GOAL #1   Title  Pt will average 92dB on loud "AH" over 3 sessions with rare min A    Baseline  01/09/17;     Time  3    Period  Weeks    Status  On-going      SLP LONG TERM GOAL #2   Title  Pt will complete HEP for dysphagia and dysarthria with rare min A over 2 sessions    Baseline  01/09/17    Time  3    Period  Weeks    Status  On-going      SLP LONG TERM GOAL #3   Title  Spouse/pt will report carryover of swallow precautions/diet modifications outside of therapy with occasional min cues from wife, over 3 sessions    Baseline  01-03-17 01/09/17    Status  Achieved      SLP LONG TERM GOAL #4   Title  Pt will average 70 dB over 15 minute conversation with rare min A over 3 sessions    Time  3    Period  Weeks    Status  On-going      SLP LONG TERM GOAL #5   Title  Pt will be audible and intelligible in noisy environment during 15 minute conversation over 2 sessions.    Time  3    Period  Weeks    Status  On-going  Plan - 01/24/17 1729     Clinical Impression Statement  Pt reports noncompliance with HEP frequency at home - SLP reminded pt of rationale for swallow HEP. Spouse cont to report good carryover of precautions at home with her verbal cues. Question whether fatigue may be impacting pt as eating has become an effort with multiple swallows required. Noted increasing signs of aspiration as trials progressed. Agree with NP that objective swallow evaluation is warranted to determine whether pt has had a decline in function. MBS scheduled for next week; FEES may also be beneficial due to suspected difficulties with secretion management. Educated pt at length re: importance of completing HEP and that he must complete regularly in order to see functional gains. Continue skilled ST to maximize intelligibility and safety of swallow.     Speech Therapy Frequency  2x / week    Treatment/Interventions  SLP instruction and feedback;Internal/external aids;Compensatory strategies;Environmental controls;Oral motor exercises;Patient/family education;Pharyngeal strengthening exercises;Functional tasks;Language facilitation    Potential to Achieve Goals  Good    Potential Considerations  Cooperation/participation level    SLP Home Exercise Plan  Loud "AH", dysphagia HEP; dysarthria HEP    Consulted and Agree with Plan of Care  Patient;Family member/caregiver    Family Member Consulted  spouse [name]       Patient will benefit from skilled therapeutic intervention in order to improve the following deficits and impairments:   Dysphagia, oropharyngeal phase - Plan: SLP eval and treat  Dysarthria and anarthria    Problem List Patient Active Problem List   Diagnosis Date Noted  . Lumbar back pain with radiculopathy affecting right lower extremity 09/19/2016  . Elevated transaminase level 12/20/2015  . Chronic fatigue 12/02/2015  . Raynaud's phenomenon 04/16/2014  . Insomnia 03/31/2014  . Syncope 11/11/2012  . Autonomic postural hypotension  11/11/2012  . Parkinson's disease ([place]) 06/05/2012  . Restless leg syndrome 11/15/2011  . Primary hypothyroidism 09/11/2011  . Parkinsonism ([place]) 04/02/2011  . Depression, major, recurrent, in partial remission ([place]) 04/02/2011   Deneise Lever, [place], [place] 01/24/2017, 5:33 PM  [facility] [address] [place], [place], [address] Phone: [phone]   Fax:  [phone]   Name: Vishaal Strollo MRN: [id number] Date of Birth: 01-05-1946

## 2017-01-25 ENCOUNTER — Telehealth: Payer: Self-pay | Admitting: Neurology

## 2017-01-25 ENCOUNTER — Other Ambulatory Visit (HOSPITAL_COMMUNITY): Payer: Self-pay | Admitting: Physician Assistant

## 2017-01-25 ENCOUNTER — Encounter: Payer: Self-pay | Admitting: Neurology

## 2017-01-25 DIAGNOSIS — G20 Parkinson's disease: Secondary | ICD-10-CM

## 2017-01-25 DIAGNOSIS — R1319 Other dysphagia: Secondary | ICD-10-CM

## 2017-01-25 NOTE — Telephone Encounter (Signed)
-----   Message from [place], DO sent at 01/25/2017  7:27 AM EST ----- Can you make sure that this was placed.  I don't remember signing it but I could have and its not in my box now. ----- Message ----- From: Aliene Altes, CCC-SLP Sent: 01/24/2017   5:38 PM To: Eustace Quail Tat, DO  Hi Dr. Gustavus Bryant, NP saw Mr. Stripling earlier this week for productive cough. Garald Balding and I have been concerned about decline in swallow function; showing signs of aspiration during session today. I have placed an order in Epic for repeat MBS. Would you please sign if you are in agreement? Study has been scheduled for next Thursday.   Thank you, Deneise Lever, [place], [place] Speech-Language Pathologist

## 2017-01-25 NOTE — Telephone Encounter (Signed)
Azilect not mentioned in last note. Okay to send RX?

## 2017-01-25 NOTE — Telephone Encounter (Signed)
Order entered

## 2017-01-31 ENCOUNTER — Ambulatory Visit: Payer: Medicare Other

## 2017-02-01 ENCOUNTER — Ambulatory Visit (HOSPITAL_COMMUNITY)
Admission: RE | Admit: 2017-02-01 | Discharge: 2017-02-01 | Disposition: A | Discharge status: Home / Self Care | Payer: Medicare Other | Source: Ambulatory Visit | Attending: Physician Assistant | Admitting: Physician Assistant

## 2017-02-01 DIAGNOSIS — R05 Cough: Secondary | ICD-10-CM

## 2017-02-01 DIAGNOSIS — T17320A Food in larynx causing asphyxiation, initial encounter: Secondary | ICD-10-CM | POA: Diagnosis not present

## 2017-02-01 DIAGNOSIS — T17308A Unspecified foreign body in larynx causing other injury, initial encounter: Secondary | ICD-10-CM | POA: Diagnosis not present

## 2017-02-01 DIAGNOSIS — R1310 Dysphagia, unspecified: Secondary | ICD-10-CM | POA: Diagnosis not present

## 2017-02-01 DIAGNOSIS — R058 Other specified cough: Secondary | ICD-10-CM

## 2017-02-01 DIAGNOSIS — M79632 Pain in left forearm: Secondary | ICD-10-CM

## 2017-02-05 ENCOUNTER — Other Ambulatory Visit: Payer: Self-pay

## 2017-02-05 ENCOUNTER — Ambulatory Visit: Payer: Medicare Other | Admitting: Speech Pathology

## 2017-02-05 DIAGNOSIS — R1312 Dysphagia, oropharyngeal phase: Secondary | ICD-10-CM

## 2017-02-05 DIAGNOSIS — R471 Dysarthria and anarthria: Secondary | ICD-10-CM

## 2017-02-05 NOTE — Therapy (Signed)
[facility] [address] [place], [place], [address] Phone: [phone]   Fax:  [phone]  Speech Language Pathology Treatment  Patient Details  Name: [name] MRN: [id number] Date of Birth: 10-18-1945 Referring Provider: Dr. Wells Guiles Tat   Encounter Date: 02/05/2017  End of Session - 02/05/17 1856    Visit Number  13    Number of Visits  23    Date for SLP Re-Evaluation  03/23/17    SLP Start Time  0847    SLP Stop Time   0928    SLP Time Calculation (min)  41 min       Past Medical History:  Diagnosis Date  . Allergy   . Anemia   . Depression   . Parkinson's disease ([place])   . Parkinson's disease ([place]) 06/05/2012  . Raynaud's syndrome   . Thyroid disease     Past Surgical History:  Procedure Laterality Date  . APPENDECTOMY    . FRACTURE SURGERY     jaw  . HERNIA REPAIR    . MANDIBLE FRACTURE SURGERY    . ROTATOR CUFF REPAIR    . TONSILLECTOMY    . VASECTOMY    . WRIST FRACTURE SURGERY      There were no vitals filed for this visit.  Subjective Assessment - 02/05/17 1833    Subjective  "I had the swallow test."    Patient is accompained by:  Family member    Special Tests  spouse, [name]    Currently in Pain?  No/denies            ADULT SLP TREATMENT - 02/05/17 0846      General Information   Behavior/Cognition  Alert;Cooperative;Pleasant mood    Patient Positioning  Upright in chair      Treatment Provided   Treatment provided  Dysphagia      Dysphagia Treatment   Temperature Spikes Noted  No    Respiratory Status  Room air    Oral Cavity - Dentition  Adequate natural dentition    Treatment Methods  Therapeutic exercise;Compensation strategy training;Patient/caregiver education    Patient observed directly with PO's  No    Other treatment/comments  SLP reviewed results and recommendations of MBS. Pt reports "this week has been better for eating." SLP educated that  even with fewer overt signs of aspiration, MBS showed mostly silent aspiration. Educated re: importance of monitoring for signs of aspiration pneumonia and again the need to regularly complete HEP. Assessed pt's maximum inspiratory(MIP) and expiratory (MEP) pressures. Pt's MIP reduced at 19 cm H20 (51.65 is lower limit of normal for age. MEP also reduced at 43 cm H20 (59.73 is lower limit of normal for age). Issued pt respiratory trainer and provided written instructions as well as demonstration and feedback for technique. Pt required usual mod A initially for timing, and intensity. SLP able to fade support to rare min A. Pt completed 5x5 reps of both inspiratory device (set to 15cm H20) and expiratory device (set to 30 cm h20) with good technique and with noted fatigue with final repetitions. Instructed to complete this routine 2x per day.      Pain Assessment   Pain Assessment  No/denies pain      Assessment / Recommendations / Plan   Plan  Goals updated      Dysphagia Recommendations   Diet recommendations  Dysphagia 3 (mechanical soft);Thin liquid    Liquids provided via  Cup    Medication  Administration  Crushed with puree    Supervision  Intermittent supervision to cue for compensatory strategies    Compensations  Slow rate;Small sips/bites;Multiple dry swallows after each bite/sip;Follow solids with liquid effortful swallow    Postural Changes and/or Swallow Maneuvers  Seated upright 90 degrees      Progression Toward Goals   Progression toward goals  -- Goals revised to target dysphagia only at this time      General Information   Reason PO's not observed  Other (comment) therapeutic exercise       SLP Education - 02/05/17 1855    Education provided  Yes    Education Details  RMT, signs of aspiration PNA, swallow precautions    Person(s) Educated  Patient;Spouse    Methods  Explanation;Demonstration;Handout;Verbal cues;Tactile cues    Comprehension  Verbalized  understanding;Verbal cues required;Need further instruction       SLP Short Term Goals - 02/05/17 1906      SLP SHORT TERM GOAL #1   Title  Pt will average 90dB on loud "AH" with rare min A over 3 sessions.    Status  Achieved      SLP SHORT TERM GOAL #2   Title  Pt will perform HEP for dysphagia and dysarthria with occasional min A over 3 sessions    Status  Partially Met      SLP SHORT TERM GOAL #3   Title  Pt will follow diet modificiations/swallow precautions with occasional min A over 2 sessions    Status  Achieved      SLP SHORT TERM GOAL #4   Title  Pt will average 72dB on structured speech tasks 10/12 sentences with occasional min A    Status  Partially Met      SLP SHORT TERM GOAL #5   Title  Pt will utlize compensations for dysarthria/slur at sentence level 10/12 sentences with occasional min A    Status  Achieved       SLP Long Term Goals - 02/05/17 1906      SLP LONG TERM GOAL #1   Title  Pt will average 92dB on loud "AH" over 3 sessions with rare min A    Time  2    Period  Weeks    Status  Deferred      SLP LONG TERM GOAL #2   Title  Pt will complete HEP for dysphagia with rare min A over 2 sessions    Baseline  -- revised and continued 02/05/17    Time  6    Period  Weeks    Status  Revised      SLP LONG TERM GOAL #3   Title  Spouse/pt will report carryover of swallow precautions/diet modifications outside of therapy with occasional min cues from wife, over 3 sessions    Status  Achieved      SLP LONG TERM GOAL #4   Title  Pt will average 70 dB over 15 minute conversation with rare min A over 3 sessions    Time  2    Period  Weeks    Status  Deferred      SLP LONG TERM GOAL #5   Title  Pt will be audible and intelligible in noisy environment during 15 minute conversation over 2 sessions.    Time  2    Period  Weeks    Status  Deferred      Additional Long Term Goals   Additional Long Term Goals  Yes      SLP LONG TERM GOAL #6   Title  Pt  will adhere to completion of HEP 5/7 days per pt/family report over 2 sessions.    Time  6    Period  Weeks    Status  New      SLP LONG TERM GOAL #7   Title  Pt will tell SLP at least 2 ways to reduce risk for aspiration PNA.    Time  6    Period  Weeks    Status  New       Plan - 02/05/17 1904    Clinical Impression Statement  Pt with moderate oral and severe pharyngo-cervical esophageal dysphagia and probable chronic low-grade silent aspiration per MBS on 02/01/17. In discussion with pt and his wife, determined dysphagia is their primary concern at this time. Patient compliance with HEP has been poor to fair per their report. Plan of care revised today to focus on dysphagia goals and renewal completed today for 6 additional sessions, dropping frequency to 1x per week beginning 02/19/17. Will consider increasing frequency and intensity, but educated pt that he must adhere to HEP or will need to d/c. Skilled ST remains necessary to maximize safety of swallowing and quality of life.    Speech Therapy Frequency  -- 2x per week until 02/19/17, then 1x per week for 4 additional visits    Treatment/Interventions  SLP instruction and feedback;Internal/external aids;Compensatory strategies;Environmental controls;Oral motor exercises;Patient/family education;Pharyngeal strengthening exercises;Functional tasks;Language facilitation    Potential to Achieve Goals  Fair    Potential Considerations  Cooperation/participation level    SLP Home Exercise Plan  RMT    Consulted and Agree with Plan of Care  Patient;Family member/caregiver    Family Member Consulted  spouse [name]       Patient will benefit from skilled therapeutic intervention in order to improve the following deficits and impairments:   Dysphagia, oropharyngeal phase - Plan: SLP plan of care cert/re-cert  Dysarthria and anarthria - Plan: SLP plan of care cert/re-cert    Problem List Patient Active Problem List   Diagnosis Date Noted   . Lumbar back pain with radiculopathy affecting right lower extremity 09/19/2016  . Elevated transaminase level 12/20/2015  . Chronic fatigue 12/02/2015  . Raynaud's phenomenon 04/16/2014  . Insomnia 03/31/2014  . Syncope 11/11/2012  . Autonomic postural hypotension 11/11/2012  . Parkinson's disease ([place]) 06/05/2012  . Restless leg syndrome 11/15/2011  . Primary hypothyroidism 09/11/2011  . Parkinsonism ([place]) 04/02/2011  . Depression, major, recurrent, in partial remission ([place]) 04/02/2011   Deneise Lever, [place], [place] 02/05/2017, 7:13 PM  [facility] [address] [place], [place], [address] Phone: [phone]   Fax:  [phone]   Name: [name] MRN: [id number] Date of Birth: 1945/02/22

## 2017-02-05 NOTE — Patient Instructions (Signed)
You have been issued a Respiratory Muscle Strength Trainer(s) to increase lung strength for improved speech and/or swallowing abilities. Please follow the instructions below provided by your Speech-Language Pathologist to complete your exercises.  Expiratory Muscle Training  [place] Device - "Blue Equals Blow"  . Perform  5 repetitions, 5 times per day . Device set at 30 cm H20  How to perform: . Take a big breath, then blow hard into the trainer (device) . Rest for at least 2 seconds between repetitions  What you should feel/hear: Burst of air through the device  Inspiratory Muscle Training Clear Device - "Suck in"  . Perform 5 repetitions, 5 times per day . Device set at 15 cm H2O  How to perform: . Place trainer in your mouth and then suck into the trainer (device) . Rest for at least 2 seconds between repetitions  What you should feel/hear: Burst of air through the device  Take a break or discontinue use if you experience lightheadedness, dizziness, pain, shortness of breath, color change, or excessive sweating. Any questions, call [phone]. Please bring your device(s) to therapy sessions.

## 2017-02-07 ENCOUNTER — Ambulatory Visit: Payer: Medicare Other | Admitting: Speech Pathology

## 2017-02-07 ENCOUNTER — Encounter: Payer: Self-pay | Admitting: Speech Pathology

## 2017-02-07 DIAGNOSIS — R1312 Dysphagia, oropharyngeal phase: Secondary | ICD-10-CM

## 2017-02-07 DIAGNOSIS — R471 Dysarthria and anarthria: Secondary | ICD-10-CM

## 2017-02-07 NOTE — Therapy (Signed)
[facility] [address] [place], [place], [address] Phone: [phone]   Fax:  [phone]  Speech Language Pathology Treatment  Patient Details  Name: [name] MRN: [id number] Date of Birth: 07/06/1945 Referring Provider: Dr. Wells Guiles Tat   Encounter Date: 02/07/2017  End of Session - 02/07/17 0932    Visit Number  14    Number of Visits  23    Date for SLP Re-Evaluation  03/23/17    SLP Start Time  0845    SLP Stop Time   0925    SLP Time Calculation (min)  40 min    Activity Tolerance  Patient tolerated treatment well       Past Medical History:  Diagnosis Date  . Allergy   . Anemia   . Depression   . Parkinson's disease ([place])   . Parkinson's disease ([place]) 06/05/2012  . Raynaud's syndrome   . Thyroid disease     Past Surgical History:  Procedure Laterality Date  . APPENDECTOMY    . FRACTURE SURGERY     jaw  . HERNIA REPAIR    . MANDIBLE FRACTURE SURGERY    . ROTATOR CUFF REPAIR    . TONSILLECTOMY    . VASECTOMY    . WRIST FRACTURE SURGERY      There were no vitals filed for this visit.  Subjective Assessment - 02/07/17 0901    Subjective  "I             ADULT SLP TREATMENT - 02/07/17 0924      General Information   Behavior/Cognition  Alert;Cooperative;Pleasant mood      Treatment Provided   Treatment provided  Dysphagia      Dysphagia Treatment   Temperature Spikes Noted  No    Respiratory Status  Room air    Oral Cavity - Dentition  Adequate natural dentition    Treatment Methods  Therapeutic exercise;Compensation strategy training;Patient/caregiver education    Patient observed directly with PO's  Yes    Type of PO's observed  Dysphagia 3 (soft);Thin liquids    Feeding  Able to feed self    Liquids provided via  Cup    Pharyngeal Phase Signs & Symptoms  Delayed throat clear;Immediate cough;Delayed cough;Complaints of residue;Immediate throat clear    Type of  cueing  Verbal    Amount of cueing  Minimal    Other treatment/comments  Pt and spouse report pt completing RMT 25x twice daily. Today, pt required occasional min verbal cues for 2 seoond break in between respirations and cues to strong inspiration/expiration. As pt speeds up RMT repetition, he does not listen for/achieve air burst. Spouse reports having to cue him at home for this also. Spouse, [name], verbalized correct technique and demonstrated adequate cueing for correct RMT. Pt followed swallow precautions with mod I, min cues to cough/throat clear hard when he needs to.       Pain Assessment   Pain Assessment  No/denies pain      Dysphagia Recommendations   Diet recommendations  Dysphagia 3 (mechanical soft);Thin liquid    Liquids provided via  Cup    Medication Administration  Crushed with puree    Supervision  Intermittent supervision to cue for compensatory strategies    Compensations  Slow rate;Small sips/bites;Multiple dry swallows after each bite/sip;Follow solids with liquid    Postural Changes and/or Swallow Maneuvers  Seated upright 90 degrees      Progression Toward Goals   Progression toward  goals  Progressing toward goals       SLP Education - 02/07/17 [id number]    Education provided  Yes    Education Details  continue RMT, swallow precautions, maximize protein/calories on off days    Person(s) Educated  Patient;Spouse    Methods  Explanation;Demonstration;Verbal cues;Handout    Comprehension  Verbalized understanding;Verbal cues required;Need further instruction       SLP Short Term Goals - 02/07/17 0932      SLP SHORT TERM GOAL #1   Title  Pt will average 90dB on loud "AH" with rare min A over 3 sessions.    Status  Achieved      SLP SHORT TERM GOAL #2   Title  Pt will perform HEP for dysphagia and dysarthria with occasional min A over 3 sessions    Status  Partially Met      SLP SHORT TERM GOAL #3   Title  Pt will follow diet modificiations/swallow precautions  with occasional min A over 2 sessions    Status  Achieved      SLP SHORT TERM GOAL #4   Title  Pt will average 72dB on structured speech tasks 10/12 sentences with occasional min A    Status  Partially Met      SLP SHORT TERM GOAL #5   Title  Pt will utlize compensations for dysarthria/slur at sentence level 10/12 sentences with occasional min A    Status  Achieved       SLP Long Term Goals - 02/07/17 0932      SLP LONG TERM GOAL #1   Title  Pt will average 92dB on loud "AH" over 3 sessions with rare min A    Time  2    Period  Weeks    Status  Deferred      SLP LONG TERM GOAL #2   Title  Pt will complete HEP for dysphagia with rare min A over 2 sessions    Baseline  -- revised and continued 02/05/17    Time  6    Period  Weeks    Status  Revised      SLP LONG TERM GOAL #3   Title  Spouse/pt will report carryover of swallow precautions/diet modifications outside of therapy with occasional min cues from wife, over 3 sessions    Status  Achieved      SLP LONG TERM GOAL #4   Title  Pt will average 70 dB over 15 minute conversation with rare min A over 3 sessions    Time  2    Period  Weeks    Status  Deferred      SLP LONG TERM GOAL #5   Title  Pt will be audible and intelligible in noisy environment during 15 minute conversation over 2 sessions.    Time  2    Period  Weeks    Status  Deferred      SLP LONG TERM GOAL #6   Title  Pt will adhere to completion of HEP 5/7 days per pt/family report over 2 sessions.    Time  6    Period  Weeks    Status  New      SLP LONG TERM GOAL #7   Title  Pt will tell SLP at least 2 ways to reduce risk for aspiration PNA.    Time  6    Period  Weeks    Status  New       Plan -  02/07/17 0929    Clinical Impression Statement  Pt completing RMT consistently per HEP at home. Today he required cues for 2 second break in between to listen for burst of air. As he sped up repetitions, burst not achieved. Spouse demonstrated aequate  cueing for correct completion of RMT. Pt following swallow precauations with mod I. Spouse reporting  meals taking 1 hour. Suggested she provide higher protein/calorie foods to maintain weight and nutrition. Continue skilled ST to maximize safety of swallow.    Speech Therapy Frequency  2x / week    Treatment/Interventions  SLP instruction and feedback;Internal/external aids;Compensatory strategies;Environmental controls;Oral motor exercises;Patient/family education;Pharyngeal strengthening exercises;Functional tasks;Language facilitation    Potential to Achieve Goals  Fair    Potential Considerations  Cooperation/participation level    SLP Home Exercise Plan  RMT    Consulted and Agree with Plan of Care  Patient;Family member/caregiver    Family Member Consulted  spouse [name]       Patient will benefit from skilled therapeutic intervention in order to improve the following deficits and impairments:   Dysphagia, oropharyngeal phase  Dysarthria and anarthria    Problem List Patient Active Problem List   Diagnosis Date Noted  . Lumbar back pain with radiculopathy affecting right lower extremity 09/19/2016  . Elevated transaminase level 12/20/2015  . Chronic fatigue 12/02/2015  . Raynaud's phenomenon 04/16/2014  . Insomnia 03/31/2014  . Syncope 11/11/2012  . Autonomic postural hypotension 11/11/2012  . Parkinson's disease ([place]) 06/05/2012  . Restless leg syndrome 11/15/2011  . Primary hypothyroidism 09/11/2011  . Parkinsonism ([place]) 04/02/2011  . Depression, major, recurrent, in partial remission ([place]) 04/02/2011    Liandra Mendia, Annye Rusk MS, CCC-SLP 02/07/2017, 9:33 AM  [facility] [address] [place], [place], [address] Phone: [phone]   Fax:  [phone]   Name: [name] MRN: [id number] Date of Birth: Jan 31, 1946

## 2017-02-07 NOTE — Patient Instructions (Signed)
  When Ly[place] is feeling fatigued or having an off day, consider providing softer foods  Consider providing higher protein, full fat foods rather than low calorie foods (ie: full fat yogurt instead off applesauce)  Consider protein shakes, smoothies, Ensure to supplement if you are needing to supplement what you are eating

## 2017-02-15 ENCOUNTER — Encounter: Payer: Self-pay | Admitting: Neurology

## 2017-02-16 ENCOUNTER — Encounter: Payer: Self-pay | Admitting: Family Medicine

## 2017-02-16 MED ORDER — ROPINIROLE HCL 2 MG PO TABS: ORAL_TABLET | ORAL | 0 refills | Status: DC

## 2017-02-19 ENCOUNTER — Ambulatory Visit: Payer: Medicare Other | Admitting: Speech Pathology

## 2017-02-19 ENCOUNTER — Encounter: Payer: Self-pay | Admitting: Speech Pathology

## 2017-02-19 ENCOUNTER — Other Ambulatory Visit: Payer: Self-pay

## 2017-02-19 DIAGNOSIS — R1312 Dysphagia, oropharyngeal phase: Secondary | ICD-10-CM | POA: Diagnosis not present

## 2017-02-19 DIAGNOSIS — R471 Dysarthria and anarthria: Secondary | ICD-10-CM | POA: Diagnosis not present

## 2017-02-19 NOTE — Patient Instructions (Signed)
Continue using your respiratory trainers 2-3 x a day.  Make sure you are taking your pills with applesauce. Check with your doctor about which medications can be crushed. For the ones that can't be crushed, take them one at a time in applesauce.   SWALLOWING EXERCISES Do these 2-3 x every day  1. HARD Swallows- fast and strong    - Take sips if you need to - 20 times, 2-3 times a day, and use whenever you eat or drink  2. Swallow with your tongue sticking out  - 20 times, 2-3 times a day  3. Shaker Exercise (do the whole set 2-3 x per day)  A. Lie down, raise your head, look at your feet for 45-60 seconds - 3 times, rest for 45 seconds in between  B. Lie down, raise your head to look at your feet, do 30 one-second holds

## 2017-02-19 NOTE — Therapy (Signed)
[facility] [address] [place], [place], [address] Phone: [phone]   Fax:  [phone]  Speech Language Pathology Treatment  Patient Details  Name: [name] MRN: [id number] Date of Birth: March 20, 1945 Referring Provider: Dr. Wells Guiles Tat   Encounter Date: 02/19/2017  End of Session - 02/19/17 1000    Visit Number  15    Number of Visits  23    Date for SLP Re-Evaluation  03/23/17    SLP Start Time  0845    SLP Stop Time   0919    SLP Time Calculation (min)  34 min    Activity Tolerance  Patient tolerated treatment well       Past Medical History:  Diagnosis Date  . Allergy   . Anemia   . Depression   . Parkinson's disease ([place])   . Parkinson's disease ([place]) 06/05/2012  . Raynaud's syndrome   . Thyroid disease     Past Surgical History:  Procedure Laterality Date  . APPENDECTOMY    . FRACTURE SURGERY     jaw  . HERNIA REPAIR    . MANDIBLE FRACTURE SURGERY    . ROTATOR CUFF REPAIR    . TONSILLECTOMY    . VASECTOMY    . WRIST FRACTURE SURGERY      There were no vitals filed for this visit.  Subjective Assessment - 02/19/17 0846    Subjective  Pt reports he did RMT twice today already.    Special Tests  spouse, [name]    Currently in Pain?  No/denies            ADULT SLP TREATMENT - 02/19/17 0847      General Information   Behavior/Cognition  Alert;Cooperative;Pleasant mood    Patient Positioning  Upright in chair    Oral care provided  N/A      Treatment Provided   Treatment provided  Dysphagia      Dysphagia Treatment   Temperature Spikes Noted  No    Respiratory Status  Room air    Oral Cavity - Dentition  Adequate natural dentition    Treatment Methods  Therapeutic exercise;Compensation strategy training;Patient/caregiver education    Patient observed directly with PO's  Yes    Type of PO's observed  Thin liquids    Liquids provided via  Cup    Pharyngeal Phase  Signs & Symptoms  Immediate throat clear    Type of cueing  Verbal    Amount of cueing  Minimal    Other treatment/comments  Pt reports he has been regularly completing RMST 2-3 times per day. Pt, wife feel his swallowing has been improving. They also state he is coughing less frequently throughout the day. Voice sounds very clear today. Pt demo'd his HEP with inspiratory and expiratory trainers with initial min A for 2 second pause between breaths with inspiratory trainer. SLP instructed pt to remove from his mouth between repetitions which slowed his rate; he then maintained this through the remainder of the exercise without cues required. Increased resistance of inspiratory trainer to 17cm H20, and expiratory trainer 1/4 turn. Pt completed 25 repetitions of each with good effort and strong burst of air from the device, fatiguing slightly in final repetitions. Pt reports he has been having trouble with pills, states he is taking with water. Reviewed recommendations from [facility] and encouraged pt to take pills in applesauce, confirm with MD which pills can be crushed, particularly if they are larger due to  potential for airway obstruction. Added exercises to HEP routine (Effortful swallow, [place] and Shaker); see pt instructions. Reviewed Shaker with pt on PT mat; he demo'd with good technique 3x 60 second holds and 20x 1 second holds. Encouraged pt to sit edge of mat/bed after completing for safety as he tends to get light headed when standing up quickly. Pt demo'd taking small sips of water, subtle throat clearing noted x2. Pt required min A to cough, clear vocal quality.       Pain Assessment   Pain Assessment  No/denies pain      Assessment / Recommendations / Plan   Plan  Continue with current plan of care      Dysphagia Recommendations   Diet recommendations  Dysphagia 3 (mechanical soft);Thin liquid    Liquids provided via  Cup    Medication Administration  Crushed with puree    Supervision   Intermittent supervision to cue for compensatory strategies    Compensations  Slow rate;Small sips/bites;Multiple dry swallows after each bite/sip;Follow solids with liquid    Postural Changes and/or Swallow Maneuvers  Seated upright 90 degrees      Progression Toward Goals   Progression toward goals  Progressing toward goals       SLP Education - 02/19/17 0958    Education provided  Yes    Education Details  RMT, exercises for tongue strength and hyolaryngeal excursion, pills crushed in applesauce (check with MD re: which pills can be crushed)    Person(s) Educated  Patient;Spouse    Methods  Explanation;Demonstration;Verbal cues;Handout    Comprehension  Verbalized understanding;Need further instruction       SLP Short Term Goals - 02/19/17 1003      SLP SHORT TERM GOAL #1   Title  Pt will average 90dB on loud "AH" with rare min A over 3 sessions.    Status  Achieved      SLP SHORT TERM GOAL #2   Title  Pt will perform HEP for dysphagia and dysarthria with occasional min A over 3 sessions    Status  Partially Met      SLP SHORT TERM GOAL #3   Title  Pt will follow diet modificiations/swallow precautions with occasional min A over 2 sessions    Status  Achieved      SLP SHORT TERM GOAL #4   Title  Pt will average 72dB on structured speech tasks 10/12 sentences with occasional min A    Status  Partially Met      SLP SHORT TERM GOAL #5   Title  Pt will utlize compensations for dysarthria/slur at sentence level 10/12 sentences with occasional min A    Status  Achieved       SLP Long Term Goals - 02/19/17 1003      SLP LONG TERM GOAL #1   Title  Pt will average 92dB on loud "AH" over 3 sessions with rare min A    Status  Deferred      SLP LONG TERM GOAL #2   Title  Pt will complete HEP for dysphagia with rare min A over 2 sessions    Baseline  02/19/17    Time  5    Period  Weeks    Status  On-going      SLP LONG TERM GOAL #3   Title  Spouse/pt will report  carryover of swallow precautions/diet modifications outside of therapy with occasional min cues from wife, over 3 sessions    Status  Achieved      SLP LONG TERM GOAL #4   Title  Pt will average 70 dB over 15 minute conversation with rare min A over 3 sessions    Status  Deferred      SLP LONG TERM GOAL #5   Title  Pt will be audible and intelligible in noisy environment during 15 minute conversation over 2 sessions.    Status  Deferred      SLP LONG TERM GOAL #6   Title  Pt will adhere to completion of HEP 5/7 days per pt/family report over 2 sessions.    Time  5    Status  On-going      SLP LONG TERM GOAL #7   Title  Pt will tell SLP at least 2 ways to reduce risk for aspiration PNA.    Time  5    Period  Weeks    Status  On-going       Plan - 02/19/17 1000    Clinical Impression Statement  Pt completing RMT consistently per HEP at home. Today he required single cue for 2 second break in between inspiratory exercise to listen for burst of air. Spouse demonstrated aequate cueing for correct completion of RMT. Pt following swallow precauations with mod I. Pt, wife report subjective improvement in swallowing function. Additional pharyngeal exercises added to HEP. See pt instructions. Continue skilled ST to maximize safety of swallow.    Speech Therapy Frequency  2x / week    Treatment/Interventions  SLP instruction and feedback;Internal/external aids;Compensatory strategies;Environmental controls;Oral motor exercises;Patient/family education;Pharyngeal strengthening exercises;Functional tasks;Language facilitation    Potential to Achieve Goals  Good    SLP Home Exercise Plan  RMT    Consulted and Agree with Plan of Care  Patient;Family member/caregiver       Patient will benefit from skilled therapeutic intervention in order to improve the following deficits and impairments:   Dysphagia, oropharyngeal phase    Problem List Patient Active Problem List   Diagnosis Date Noted  .  Lumbar back pain with radiculopathy affecting right lower extremity 09/19/2016  . Elevated transaminase level 12/20/2015  . Chronic fatigue 12/02/2015  . Raynaud's phenomenon 04/16/2014  . Insomnia 03/31/2014  . Syncope 11/11/2012  . Autonomic postural hypotension 11/11/2012  . Parkinson's disease ([place]) 06/05/2012  . Restless leg syndrome 11/15/2011  . Primary hypothyroidism 09/11/2011  . Parkinsonism ([place]) 04/02/2011  . Depression, major, recurrent, in partial remission ([place]) 04/02/2011   Deneise Lever, [place], [place] 02/19/2017, 10:05 AM  [facility] [address] [place], [place], [address] Phone: [phone]   Fax:  [phone]   Name: [name] MRN: [id number] Date of Birth: [birth year]

## 2017-02-20 ENCOUNTER — Other Ambulatory Visit: Payer: Self-pay | Admitting: Family Medicine

## 2017-02-20 DIAGNOSIS — E039 Hypothyroidism, unspecified: Secondary | ICD-10-CM

## 2017-02-26 ENCOUNTER — Ambulatory Visit: Payer: Medicare Other | Attending: Neurology | Admitting: Speech Pathology

## 2017-02-26 DIAGNOSIS — R1312 Dysphagia, oropharyngeal phase: Secondary | ICD-10-CM | POA: Insufficient documentation

## 2017-02-26 DIAGNOSIS — R471 Dysarthria and anarthria: Secondary | ICD-10-CM | POA: Insufficient documentation

## 2017-02-26 NOTE — Therapy (Signed)
[facility] [address] [place], [place], [address] Phone: [phone]   Fax:  [phone]  Speech Language Pathology Treatment  Patient Details  Name: [name] MRN: [id number] Date of Birth: [birth year] Referring Provider: Dr. Wells Guiles Tat   Encounter Date: 02/26/2017  End of Session - 02/26/17 1441    Visit Number  16    Number of Visits  23    Date for SLP Re-Evaluation  03/23/17    SLP Start Time  0201    SLP Stop Time   0236    SLP Time Calculation (min)  35 min    Activity Tolerance  Patient tolerated treatment well       Past Medical History:  Diagnosis Date  . Allergy   . Anemia   . Depression   . Parkinson's disease ([place])   . Parkinson's disease ([place]) 06/05/2012  . Raynaud's syndrome   . Thyroid disease     Past Surgical History:  Procedure Laterality Date  . APPENDECTOMY    . FRACTURE SURGERY     jaw  . HERNIA REPAIR    . MANDIBLE FRACTURE SURGERY    . ROTATOR CUFF REPAIR    . TONSILLECTOMY    . VASECTOMY    . WRIST FRACTURE SURGERY      There were no vitals filed for this visit.  Subjective Assessment - 02/26/17 1404    Subjective  "I got strangled on a pill"    Patient is accompained by:  Family member    Special Tests  spouse, [name]    Currently in Pain?  No/denies            ADULT SLP TREATMENT - 02/26/17 1406      General Information   Behavior/Cognition  Alert;Cooperative;Pleasant mood    Patient Positioning  Upright in chair      Treatment Provided   Treatment provided  Dysphagia      Dysphagia Treatment   Temperature Spikes Noted  No    Respiratory Status  Room air    Oral Cavity - Dentition  Adequate natural dentition    Treatment Methods  Therapeutic exercise;Compensation strategy training;Patient/caregiver education    Patient observed directly with PO's  Yes    Type of PO's observed  Dysphagia 3 (soft);Thin liquids    Feeding  Able to feed self    Liquids provided via  Cup    Pharyngeal Phase Signs & Symptoms  Immediate cough;Wet vocal quality at end of snack x1 with liquid wash of cereal bar    Type of cueing  Verbal    Amount of cueing  Minimal    Other treatment/comments  Pt arrived, speaking volume WNL today. Reports he had a coughing episode after taking carb/levo for 30 minutes before ultimately coughing up the pill and reswallowing. Pt states he took the pill with liquid. Again reviewed recommendations to take medications in applesauce, pudding or yogurt vs with liquid. Educated re: risks including aspiration, airway obstruction. Pt demo'd RMT with rare min A from spouse for 2 second pause. Pt told SLP swallowing precautions and he demo'd with POs independently. Cough x1 noted after liquid wash of solid. Pt reporting difficulties communicating with an old friend this week, due to anomia. SLP educated pt/wife re: compensations and cuing strategies to increase communication effectiveness.      Pain Assessment   Pain Assessment  No/denies pain      Assessment / Recommendations / Plan   Plan  Continue with current plan of care      Dysphagia Recommendations   Diet recommendations  Dysphagia 3 (mechanical soft);Regular;Thin liquid    Liquids provided via  Cup    Medication Administration  Crushed with puree whole in puree for medications which cannot be crushed    Supervision  Intermittent supervision to cue for compensatory strategies    Compensations  Slow rate;Small sips/bites;Multiple dry swallows after each bite/sip;Follow solids with liquid    Postural Changes and/or Swallow Maneuvers  Seated upright 90 degrees      Progression Toward Goals   Progression toward goals  Progressing toward goals       SLP Education - 02/26/17 1442    Education provided  Yes    Education Details  supportive conversation strategies for spouse to assist pt in conversations with friends    Person(s) Educated  Patient;Spouse    Methods   Explanation;Demonstration    Comprehension  Verbalized understanding       SLP Short Term Goals - 02/19/17 1003      SLP SHORT TERM GOAL #1   Title  Pt will average 90dB on loud "AH" with rare min A over 3 sessions.    Status  Achieved      SLP SHORT TERM GOAL #2   Title  Pt will perform HEP for dysphagia and dysarthria with occasional min A over 3 sessions    Status  Partially Met      SLP SHORT TERM GOAL #3   Title  Pt will follow diet modificiations/swallow precautions with occasional min A over 2 sessions    Status  Achieved      SLP SHORT TERM GOAL #4   Title  Pt will average 72dB on structured speech tasks 10/12 sentences with occasional min A    Status  Partially Met      SLP SHORT TERM GOAL #5   Title  Pt will utlize compensations for dysarthria/slur at sentence level 10/12 sentences with occasional min A    Status  Achieved       SLP Long Term Goals - 02/26/17 1424      SLP LONG TERM GOAL #1   Title  Pt will average 92dB on loud "AH" over 3 sessions with rare min A    Status  Deferred      SLP LONG TERM GOAL #2   Title  Pt will complete HEP for dysphagia with rare min A over 2 sessions    Baseline  02/19/17, RMT 02/27/16, needs to demo Shaker and BOT exercises    Time  4    Period  Weeks      SLP LONG TERM GOAL #3   Title  Spouse/pt will report carryover of swallow precautions/diet modifications outside of therapy with occasional min cues from wife, over 3 sessions    Status  Achieved      SLP LONG TERM GOAL #4   Title  Pt will average 70 dB over 15 minute conversation with rare min A over 3 sessions    Status  Deferred      SLP LONG TERM GOAL #5   Title  Pt will be audible and intelligible in noisy environment during 15 minute conversation over 2 sessions.    Status  Deferred      SLP LONG TERM GOAL #6   Title  Pt will adhere to completion of HEP 5/7 days per pt/family report over 2 sessions.    Baseline  02/26/17  Time  4    Period  Weeks      SLP  LONG TERM GOAL #7   Title  Pt will tell SLP at least 2 ways to reduce risk for aspiration PNA.    Time  4    Period  Weeks    Status  On-going         Patient will benefit from skilled therapeutic intervention in order to improve the following deficits and impairments:   Dysphagia, oropharyngeal phase    Problem List Patient Active Problem List   Diagnosis Date Noted  . Lumbar back pain with radiculopathy affecting right lower extremity 09/19/2016  . Elevated transaminase level 12/20/2015  . Chronic fatigue 12/02/2015  . Raynaud's phenomenon 04/16/2014  . Insomnia 03/31/2014  . Syncope 11/11/2012  . Autonomic postural hypotension 11/11/2012  . Parkinson's disease ([place]) 06/05/2012  . Restless leg syndrome 11/15/2011  . Primary hypothyroidism 09/11/2011  . Parkinsonism ([place]) 04/02/2011  . Depression, major, recurrent, in partial remission ([place]) 04/02/2011   Deneise Lever, [place], [place] 02/26/2017, 2:49 PM  [facility] [address] [place], [place], [address] Phone: [phone]   Fax:  [phone]   Name: Livingston Denner MRN: [id number] Date of Birth: Nov 12, 1945

## 2017-03-01 NOTE — Progress Notes (Signed)
[name] was seen today in the movement disorders clinic for neurologic consultation at the request of [place], Malka So, MD.  This patient is accompanied in the office by his spouse who supplements the history.  The consultation is for the evaluation of PD.  Pt has previously seen Dr. Rexene Alberts and I have reviewed his records in detail (went one by one through all notes, emails, phone correspondence).  Had seen Dr. Erling Cruz prior to Dr. Rexene Alberts but I don't have his notes.  I do, however, have a summary of his notes from Dr. Rexene Alberts.  The patient was diagnosed with Parkinson's disease in 2009.  He cannot remember what presenting complaint was but wife thinks it was trouble with trouble with fine motor skills on the right and he is right hand dominant.  He cannot remember if ropinirole or levodopa was his first medication (thinks ropinirole), but does remember that ropinirole in higher dosages then he currently is on caused compulsive side effects such as hypersexuality, excessive spending and daytime hypersomnolence.  Currently, he is taking ropinirole 2mg , 1/2 in the AM, 1/2 at lunch, 1/2 at dinner and 1.5 tablets at dinner.  He is taking carbidopa/levodopa 25/100, 1 tablet at 7am/10am/12pm/4pm/bedtime.   Sometimes he will forget the levodopa and he will double up on them and will feel good.     Specific Symptoms:  Tremor: rarely but does have compulsive "rubbing of thumb/finger" on the right Family hx of similar:  No. (does have a fam hx of HD and was genetically checked for that and was negative) Voice: softer than in the past (never did voice therapy) Sleep: trouble staying asleep because of back/sciatic pain/spinal stenosis  Vivid Dreams:  No.  Acting out dreams:  Rare (1 episode of falling out of bed at least a year ago but relates to 2 dogs in the bed with him) Wet Pillows: Yes.   Postural symptoms:  Yes.    Falls?  Yes.   fall in October, 2011 result in a fracture of the radius; last October  had fx of ankle - slipped getting out of shower.  Last fall was that fall in October of 2017 Bradykinesia symptoms: shuffling gait, slow movements, drooling while awake, difficulty getting out of a chair and freezing gait Loss of smell:  No. Loss of taste:  No. Urinary Incontinence:  No. Difficulty Swallowing:  Yes.   (chokes on all consistiences of food; has had an unrelated cough x 3 months) Handwriting, micrographia: Yes.   Trouble with ADL's:  No. but he is very slow  Trouble buttoning clothing: Yes.  , occasionally Depression:  Yes.  , occasionally Memory changes:  No., but wife thinks some changes Hallucinations:  Yes.   (1 time per day - once he looks a 2nd time he will know it isn't real)  visual distortions: Yes.   N/V:  No. Lightheaded:  No. (states that it resolved once azilect was d/c)  Syncope: Yes.   he did have a syncopal event in 2014 while working outside in the heat.  He did follow-up with cardiology.  It was felt vasovagal and perhaps associated with dysautonomia due to Parkinson's; had another episode in February, 2016 in which he was hospitalized for syncope.  Workup was negative.  He was not particularly orthostatic when worked up.  The patient was dizzy and diaphoretic prior to the episode.  Compression stockings were recommended.  Florinef was started in February, 2017, but he did not take that for  very long at all as he felt it caused side effects.  Pt doesn't even remember the medication Diplopia:  No. Dyskinesia:  No. Cramping of feet and legs at night:  Yes  Neuroimaging of the brain has previously been performed.  MRI done of the brain in 2009 is reported to be normal.  Films are unavailable.  03/05/17 update:  Pt seen today in f/u for parkinsons.  I changed his meds last visit so he is taking carbidopa/levodopa 25/100 four times per day (5:30am/8:30pm/lunch/4:30 pm) and added carbidopa/levodopa 50/200 at bedtime.  I was hoping that would help with cramping at  bedtime.  He states that they are still cramping some.  He is also on ropinirole, 2 mg, 0.5 tablets/0.5 tablets/0.5 tablets / 1.5 tablets.  He has had no compulsive behaviors.  No sleep attacks.  He did attend therapy at the rehab center.  I have reviewed those notes.  Pt found that to be really helpful.  The patient was supposed to have a driving evaluation completed.  He reports that he did not do the evaluation but he is driving "very little."  He has had injections for his back.  Only walking for exercise.  When I asked patient about memory/thinking/mood, pt states that it is good but wife states that he is sometimes out of it and pt thinks it is from his thyroid medication.    They had this rechecked on Friday, and it was still 0.02.  They ask me for a referral to endocrinology.  prilosec is helping his GERD.  He reports that he has passed out a few times.  He states that he would stand up and just "blacked out."  He was very lightheaded after standing.  Denies hallucinations now.  PREVIOUS MEDICATIONS: Elonda Husky (went off azilect to try this and felt had SE and then went back to azilect); azilect; ropinirole; florinef; carbidopa/levodopa 25/100  ALLERGIES:   Allergies  Allergen Reactions  . Lamictal [Lamotrigine] Other (See Comments)    Blisters in mouth  . Nsaids Other (See Comments)    Bleeding in stool   . Penicillins Rash    .Marland KitchenHas patient had a PCN reaction causing immediate rash, facial/tongue/throat swelling, SOB or lightheadedness with hypotension: No Has patient had a PCN reaction causing severe rash involving mucus membranes or skin necrosis: No Has patient had a PCN reaction that required hospitalization No Has patient had a PCN reaction occurring within the last 10 years: No If all of the above answers are "NO", then may proceed with Cephalosporin use.     CURRENT MEDICATIONS:  Outpatient Encounter Medications as of 03/05/2017  Medication Sig  . aspirin 81 MG tablet Take 160 mg by  mouth daily.  . Biotin 5000 MCG TABS Take 1 tablet by mouth daily.  . carbidopa-levodopa (SINEMET CR) 50-200 MG tablet Take 1 tablet by mouth at bedtime.  . carbidopa-levodopa (SINEMET IR) 25-100 MG tablet Take 1 tablet by mouth 4 (four) times daily.  . Multiple Vitamins-Minerals (MULTIVITAMIN WITH MINERALS) tablet Take 1 tablet by mouth daily.  Marland Kitchen omeprazole (PRILOSEC) 20 MG capsule Take 1 capsule (20 mg total) by mouth daily.  Marland Kitchen OVER THE COUNTER MEDICATION Restful Legs  . rOPINIRole (REQUIP) 2 MG tablet TAKE 1/2 TABLET BY MOUTH AT 6:30 AM, 1/2 TABLET AT NOON, 1/2 TABLET AT 5:00 PM, AND 1 AND 1/2 TABLETS AT 8:00 PM  . [DISCONTINUED] levothyroxine (SYNTHROID, LEVOTHROID) 100 MCG tablet Take 1 tablet (100 mcg total) by mouth daily.  . [DISCONTINUED]  omeprazole (PRILOSEC) 20 MG capsule TAKE 1 CAPSULE(20 MG) BY MOUTH TWICE DAILY BEFORE A MEAL (Patient not taking: Reported on 11/22/2016)  . [DISCONTINUED] rasagiline (AZILECT) 0.5 MG TABS tablet Take 0.5 mg by mouth daily.   Facility-Administered Encounter Medications as of 03/05/2017  Medication  . cyanocobalamin ((VITAMIN B-12)) injection 1,000 mcg    PAST MEDICAL HISTORY:   Past Medical History:  Diagnosis Date  . Allergy   . Anemia   . Depression   . Parkinson's disease ([place])   . Parkinson's disease ([place]) 06/05/2012  . Raynaud's syndrome   . Thyroid disease     PAST SURGICAL HISTORY:   Past Surgical History:  Procedure Laterality Date  . APPENDECTOMY    . FRACTURE SURGERY     jaw  . HERNIA REPAIR    . MANDIBLE FRACTURE SURGERY    . ROTATOR CUFF REPAIR    . TONSILLECTOMY    . VASECTOMY    . WRIST FRACTURE SURGERY      SOCIAL HISTORY:   Social History   Socioeconomic History  . Marital status: Married    Spouse name: [name]  . Number of children: 2  . Years of education: 12+  . Highest education level: Not on file  Social Needs  . Financial resource strain: Not on file  . Food insecurity - worry: Not on file  . Food  insecurity - inability: Not on file  . Transportation needs - medical: Not on file  . Transportation needs - non-medical: Not on file  Occupational History  . Occupation: DISABILITY    Employer: [employer]    Comment: due to Parkinson's  Tobacco Use  . Smoking status: Never Smoker  . Smokeless tobacco: Never Used  Substance and Sexual Activity  . Alcohol use: Yes    Alcohol/week: 0.0 - 4.2 oz    Comment: 1-2 beers/weeks  . Drug use: No  . Sexual activity: No  Other Topics Concern  . Not on file  Social History Narrative   Patient is married [name])  with 2 children.   Patient is right handed.   Patient has high school education.   Patient drinks 1-2 cups daily.    FAMILY HISTORY:   Family Status  Relation Name Status  . Father  Deceased at age 25       colon cancer  . Mother  Deceased at age 70       failure to thrive  . Daughter  Alive  . Son  Alive  . MGM  Deceased  . MGF  Deceased  . PGM  Deceased  . PGF  Deceased  . Mat Aunt  Deceased  . Mat Uncle  Deceased    ROS:  A complete 10 system review of systems was obtained and was unremarkable apart from what is mentioned above.  PHYSICAL EXAMINATION:    VITALS:   Vitals:   03/05/17 0826  BP: 102/64  Pulse: 68  SpO2: 90%  Weight: 121 lb (54.9 kg)  Height: 5\' 8"  (1.727 m)    GEN:  The patient appears stated age and is in NAD. HEENT:  Normocephalic, atraumatic.  The mucous membranes are moist. The superficial temporal arteries are without ropiness or tenderness. CV:  RRR Lungs:  CTAB Neck/HEME:  There are no carotid bruits bilaterally.  Neurological examination:  Orientation:  Montreal Cognitive Assessment  10/26/2016  Visuospatial/ Executive (0/5) 2  Naming (0/3) 3  Attention: Read list of digits (0/2) 1  Attention: Read list of letters (0/1)  1  Attention: Serial 7 subtraction starting at 100 (0/3) 2  Language: Repeat phrase (0/2) 2  Language : Fluency (0/1) 0  Abstraction (0/2) 2  Delayed Recall  (0/5) 4  Orientation (0/6) 6  Total 23  Adjusted Score (based on education) 24   Cranial nerves: There is good facial symmetry except L pseudoptosis from lid lag. There is facial hypomimia.  Pupils are equal round and reactive to light bilaterally. Fundoscopic exam is attempted but the disc margins are not well visualized bilaterally. Extraocular muscles are intact. The visual fields are full to confrontational testing. The speech is fluent and mildly dysarthric.   Soft palate rises symmetrically and there is no tongue deviation. Hearing is intact to conversational tone. Sensation: Sensation is intact to light touch throughout Motor: Strength is 5/5 in the bilateral upper and lower extremities.   Shoulder shrug is equal and symmetric.  There is no pronator drift.   Movement examination: Tone: There is mild increased tone in the RUE and mild in the LLE Abnormal movements: no dyskinesia today Coordination:  There is decremation with RAM's,  including alternating supination and pronation of the forearm, hand opening and closing, finger taps.  Heel taps are fairly good bilaterally.  Toe taps are decreased on the L Gait and Station: The patient has no difficulty arising out of a deep-seated chair without the use of the hands. The patient has decreased arm swing on the right and slightly drags the right leg.  ASSESSMENT/PLAN:  1.  Parkinsonism.  He was dx in 2009.  His PD has now been complicated by dysautonomia with syncope, falls  -He has been on ropinirole since diagnosis.  He remains on ropinirole 2 mg, half a tablet at 6:30 AM, half a tablet at noon, half a tablet at 5 PM and one and a half tablets at 8 PM.  This is a total of 6 mg of Requip per day.  He apparently was on higher dosages in the past but this caused compulsive spending, hypersexuality and excessive daytime hypersomnolence. I decided to decrease the dosage today given visual distortions and memory change.  He will now take requip, 1  mg, 1 po tid.    -talked to him about adding exercise.   States that PT has talked to him about PWR moves classes    2.  Fam hx of Huntington Disease  -pt was genetically tested per records (don't have the actual testing) and was negative  3.  Dysphagia  -had MBE in 02/2016 with small penetration of think liquid.  She recommended mechanical soft diet with thin liquids.    -better with prilosec   4.  Abnormal TSH  -he asks me for referral to endocrinology.  Will refer to Dr. Renne Crigler.    5.  Orthostatic hypotension with recent syncope  -talked about hydration and importance of water.  Drinking one bottle of water per day.  -should not drive.  Discussed [place] driving laws  -compression binder given  6.  Follow up is anticipated in the next few months, sooner should new neurologic issues arise.  Much greater than 50% of this visit was spent in counseling and coordinating care.  Total face to face time:  45 min   Cc:  [place], Betty G, MD

## 2017-03-02 ENCOUNTER — Telehealth: Payer: Self-pay | Admitting: Family Medicine

## 2017-03-02 ENCOUNTER — Encounter: Payer: Self-pay | Admitting: Family Medicine

## 2017-03-02 ENCOUNTER — Ambulatory Visit (INDEPENDENT_AMBULATORY_CARE_PROVIDER_SITE_OTHER): Payer: Medicare Other

## 2017-03-02 ENCOUNTER — Other Ambulatory Visit: Payer: Self-pay | Admitting: Family Medicine

## 2017-03-02 ENCOUNTER — Other Ambulatory Visit (INDEPENDENT_AMBULATORY_CARE_PROVIDER_SITE_OTHER): Payer: Medicare Other

## 2017-03-02 DIAGNOSIS — E039 Hypothyroidism, unspecified: Secondary | ICD-10-CM

## 2017-03-02 DIAGNOSIS — Z23 Encounter for immunization: Secondary | ICD-10-CM | POA: Diagnosis not present

## 2017-03-02 LAB — TSH: TSH: 0.02 u[IU]/mL — AB (ref 0.35–4.50)

## 2017-03-02 MED ORDER — LEVOTHYROXINE SODIUM 100 MCG PO TABS: 100.0000 ug | ORAL_TABLET | Freq: Every day | ORAL | 1 refills | Status: DC

## 2017-03-02 NOTE — Telephone Encounter (Signed)
Pt would like to have a the shingle vaccine and would like to have a call to set up the appointment.

## 2017-03-05 ENCOUNTER — Ambulatory Visit (INDEPENDENT_AMBULATORY_CARE_PROVIDER_SITE_OTHER): Payer: Medicare Other | Admitting: Neurology

## 2017-03-05 ENCOUNTER — Encounter: Payer: Self-pay | Admitting: Neurology

## 2017-03-05 ENCOUNTER — Ambulatory Visit: Payer: Medicare Other | Admitting: Speech Pathology

## 2017-03-05 VITALS — BP 102/64 | HR 68 | Ht 68.0 in | Wt 121.0 lb

## 2017-03-05 DIAGNOSIS — G903 Multi-system degeneration of the autonomic nervous system: Secondary | ICD-10-CM | POA: Diagnosis not present

## 2017-03-05 DIAGNOSIS — R471 Dysarthria and anarthria: Secondary | ICD-10-CM | POA: Diagnosis not present

## 2017-03-05 DIAGNOSIS — G20 Parkinson's disease: Secondary | ICD-10-CM | POA: Diagnosis not present

## 2017-03-05 DIAGNOSIS — E039 Hypothyroidism, unspecified: Secondary | ICD-10-CM

## 2017-03-05 DIAGNOSIS — R1312 Dysphagia, oropharyngeal phase: Secondary | ICD-10-CM | POA: Diagnosis not present

## 2017-03-05 MED ORDER — ABDOMINAL BINDER/ELASTIC SMALL MISC: 1.0000 | Freq: Every day | 0 refills | Status: DC

## 2017-03-05 NOTE — Therapy (Signed)
[facility] [address] [place], [place], [address] Phone: [phone]   Fax:  [phone]  Speech Language Pathology Treatment  Patient Details  Name: [name] MRN: [id number] Date of Birth: 01/21/1946 Referring Provider: Dr. Wells Guiles Tat   Encounter Date: 03/05/2017  End of Session - 03/05/17 1729    Visit Number  17    Number of Visits  23    Date for SLP Re-Evaluation  03/23/17    SLP Start Time  1450    SLP Stop Time   1520    SLP Time Calculation (min)  30 min    Activity Tolerance  Patient tolerated treatment well       Past Medical History:  Diagnosis Date  . Allergy   . Anemia   . Depression   . Parkinson's disease ([place])   . Parkinson's disease ([place]) 06/05/2012  . Raynaud's syndrome   . Thyroid disease     Past Surgical History:  Procedure Laterality Date  . APPENDECTOMY    . FRACTURE SURGERY     jaw  . HERNIA REPAIR    . MANDIBLE FRACTURE SURGERY    . ROTATOR CUFF REPAIR    . TONSILLECTOMY    . VASECTOMY    . WRIST FRACTURE SURGERY      There were no vitals filed for this visit.  Subjective Assessment - 03/05/17 1449    Subjective  "I'm grounded." Pt reports per MD he is not to drive for 6 months.    Patient is accompained by:  Family member    Special Tests  spouse, [name]    Currently in Pain?  Yes    Pain Score  4     Pain Location  Hip    Pain Orientation  Right    Pain Descriptors / Indicators  Aching    Pain Type  Chronic pain    Pain Onset  More than a month ago    Pain Relieving Factors  exercise, walking around            ADULT SLP TREATMENT - 03/05/17 1450      General Information   Behavior/Cognition  Alert;Cooperative;Pleasant mood    Patient Positioning  Upright in chair      Treatment Provided   Treatment provided  Dysphagia      Dysphagia Treatment   Temperature Spikes Noted  No    Respiratory Status  Room air    Oral Cavity - Dentition   Adequate natural dentition    Treatment Methods  Therapeutic exercise;Compensation strategy training;Patient/caregiver education    Patient observed directly with PO's  No    Type of cueing  Verbal    Amount of cueing  Minimal    Other treatment/comments  Pt and spouse report regular completion of HEP at home. Pt and wife both report improvements in eating. Pt demo'd exercises and RMT today with modified independence. Pt completed 5x5 repetitions with both devices. Additional exercises added to target oral deficits and base of tongue retraction. Pt demo'd these with rare min A. Expiratory trainer increased an additional 1/4 turn. Pt had unknowingly turned down the intensity on his inspiratory device when cleaning. SLP readjusted to 17 cm H20 and instructed pt to check accuracy. Pt demo'd ability to adjust setting with rare min A.       Assessment / Recommendations / Plan   Plan  Continue with current plan of care      Dysphagia Recommendations  Diet recommendations  Regular;Dysphagia 3 (mechanical soft);Thin liquid    Liquids provided via  Cup    Medication Administration  Crushed with puree    Supervision  Intermittent supervision to cue for compensatory strategies    Compensations  Slow rate;Small sips/bites;Multiple dry swallows after each bite/sip;Follow solids with liquid    Postural Changes and/or Swallow Maneuvers  Seated upright 90 degrees      Progression Toward Goals   Progression toward goals  Progressing toward goals      General Information   Reason PO's not observed  Other (comment) therapeutic exercise       SLP Education - 03/05/17 1728    Education provided  Yes    Education Details  additions to HEP    Person(s) Educated  Patient;Spouse    Methods  Explanation;Demonstration;Verbal cues;Handout    Comprehension  Verbalized understanding       SLP Short Term Goals - 03/05/17 1731      SLP SHORT TERM GOAL #1   Title  Pt will average 90dB on loud "AH" with rare  min A over 3 sessions.    Status  Achieved      SLP SHORT TERM GOAL #2   Title  Pt will perform HEP for dysphagia and dysarthria with occasional min A over 3 sessions    Status  Partially Met      SLP SHORT TERM GOAL #3   Title  Pt will follow diet modificiations/swallow precautions with occasional min A over 2 sessions    Status  Achieved      SLP SHORT TERM GOAL #4   Title  Pt will average 72dB on structured speech tasks 10/12 sentences with occasional min A    Status  Partially Met      SLP SHORT TERM GOAL #5   Title  Pt will utlize compensations for dysarthria/slur at sentence level 10/12 sentences with occasional min A    Status  Achieved       SLP Long Term Goals - 03/05/17 1731      SLP LONG TERM GOAL #1   Title  Pt will average 92dB on loud "AH" over 3 sessions with rare min A    Status  Deferred      SLP LONG TERM GOAL #2   Title  Pt will complete HEP for dysphagia with rare min A over 2 sessions    Baseline  03/05/16    Time  3    Period  Weeks    Status  On-going      SLP LONG TERM GOAL #3   Title  Spouse/pt will report carryover of swallow precautions/diet modifications outside of therapy with occasional min cues from wife, over 3 sessions    Status  Achieved      SLP LONG TERM GOAL #4   Title  Pt will average 70 dB over 15 minute conversation with rare min A over 3 sessions    Status  Deferred      SLP LONG TERM GOAL #5   Title  Pt will be audible and intelligible in noisy environment during 15 minute conversation over 2 sessions.    Status  Deferred      SLP LONG TERM GOAL #6   Title  Pt will adhere to completion of HEP 5/7 days per pt/family report over 2 sessions.    Time  4    Period  Weeks    Status  Achieved      SLP LONG  TERM GOAL #7   Title  Pt will tell SLP at least 2 ways to reduce risk for aspiration PNA.    Time  3    Period  Weeks    Status  On-going       Plan - 03/05/17 1729    Clinical Impression Statement  Pt completing RMT and  exercises consistently per HEP at home. Pt, wife continue to report subjective improvements in swallowing function. Additional oral exercises added to HEP. See pt instructions. Continue skilled ST to maximize safety of swallow. Progressing toward goals, anticipate d/c next session.     Speech Therapy Frequency  2x / week    Treatment/Interventions  SLP instruction and feedback;Internal/external aids;Compensatory strategies;Environmental controls;Oral motor exercises;Patient/family education;Pharyngeal strengthening exercises;Functional tasks;Language facilitation    Potential to Achieve Goals  Good    Potential Considerations  Cooperation/participation level    SLP Home Exercise Plan  RMT, oropharyngeal exercises    Consulted and Agree with Plan of Care  Patient;Family member/caregiver    Family Member Consulted  spouse [name]       Patient will benefit from skilled therapeutic intervention in order to improve the following deficits and impairments:   Dysphagia, oropharyngeal phase    Problem List Patient Active Problem List   Diagnosis Date Noted  . Lumbar back pain with radiculopathy affecting right lower extremity 09/19/2016  . Elevated transaminase level 12/20/2015  . Chronic fatigue 12/02/2015  . Raynaud's phenomenon 04/16/2014  . Insomnia 03/31/2014  . Syncope 11/11/2012  . Autonomic postural hypotension 11/11/2012  . Parkinson's disease ([place]) 06/05/2012  . Restless leg syndrome 11/15/2011  . Primary hypothyroidism 09/11/2011  . Parkinsonism ([place]) 04/02/2011  . Depression, major, recurrent, in partial remission ([place]) 04/02/2011   Deneise Lever, [place], [place] 03/05/2017, 5:33 PM  [facility] [address] [place], [place], [address] Phone: [phone]   Fax:  [phone]   Name: [name] MRN: [id number] Date of Birth: 1945-03-08

## 2017-03-05 NOTE — Patient Instructions (Signed)
1.  Decrease your ropinirole.  If you want to use your current tablets, you have a 2 mg at home and split that in half and take 1/2 tablet three times per day.  Once those run out, take ropinirole - 1 mg - ONE full tablet three times per day.  You can let me know when you want that sent into the pharmacy since you wanted me to hold off on that today.  2.  EXERCISE and drink 1/2 gallon of WATER per day!  3.  No driving due to passing out spells.  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    4.  Get your abdominal compression binder.  You wear it all day and take it off at night.  5.  You will be referred to Dr. Renne Crigler at [facility]

## 2017-03-05 NOTE — Telephone Encounter (Signed)
Okay for patient to receive Shingrix?

## 2017-03-05 NOTE — Patient Instructions (Signed)
SWALLOWING EXERCISES Do these 2-3 x every day  1. HARD Swallows- fast and strong                                  - Take sips if you need to - 25 times, 3 sets -Also use whenever you eat or drink  2. Tongue pull-backs - Hold your tongue with a piece of gauze or paper towel and hold for 3-5 seconds - 3 sets of 10  3. Shaker Exercise (do the whole set 2-3 x per day)       A. Lie down, raise your head, look at your feet for 45-60 seconds - 3 times, rest for 45 seconds in between             B. Lie down, raise your head to look at your feet, do 30 one-second holds         4.Tongue Press            Press your entire tongue to roof of mouth and hold for 3 seconds.  3 sets of 10         5. Lip closure   Place your finger at the bottom of a straw. Close your lips around the top and suck in for 3 seconds.  3 sets of 10  You have been issued a Respiratory Muscle Strength Trainer(s) to increase lung strength for improved speech and/or swallowing abilities. Please follow the instructions below provided by your Speech-Language Pathologist to complete your exercises.  Expiratory Muscle Training  [place] Device - "Blue Equals Blow"  . Perform  5 repetitions, 5 times per day . Device set at 2 quarter turns past 30  How to perform: . Take a big breath, then blow hard into the trainer (device) . Rest for at least 2 seconds between repetitions  What you should feel/hear: Burst of air through the device  Inspiratory Muscle Training Clear Device - "Suck in"  . Perform 5 repetitions, 5 times per day . Device set at 17 cm H2O  How to perform: . Place trainer in your mouth and then suck into the trainer (device) . Rest for at least 2 seconds between repetitions  What you should feel/hear: Burst of air through the device  Take a break or discontinue use if you experience lightheadedness, dizziness, pain, shortness of breath, color change, or excessive sweating. Any questions, call [phone]  [phone]. Please bring your device(s) to therapy sessions.

## 2017-03-06 NOTE — Telephone Encounter (Signed)
As far as I know he does not have any contraindications,so It is fine for him to have Shingrix.  Not sure if his insurance will covered it if given here or at his pharmacy.  Thanks, BJ

## 2017-03-07 ENCOUNTER — Telehealth: Payer: Self-pay | Admitting: Neurology

## 2017-03-07 NOTE — Telephone Encounter (Signed)
Pt's spouse called and said he was to be referred to an endocrinologist and they have not heard anything yet from them

## 2017-03-07 NOTE — Telephone Encounter (Signed)
LMOM (per DPR) letting them know we did send referral on 03-05-17, but this was only two days ago. Left number for endocrinology [phone] for them to contact that office if they would like to call themselves.

## 2017-03-07 NOTE — Telephone Encounter (Signed)
Pt's spouse notified of instructions and verbalized understanding. Notified her that he will need to have Shingrix at the pharmacy due to having Medicare as his primary insurance.

## 2017-03-12 ENCOUNTER — Ambulatory Visit: Payer: Medicare Other | Admitting: Speech Pathology

## 2017-03-12 DIAGNOSIS — R1312 Dysphagia, oropharyngeal phase: Secondary | ICD-10-CM

## 2017-03-12 DIAGNOSIS — R471 Dysarthria and anarthria: Secondary | ICD-10-CM | POA: Diagnosis not present

## 2017-03-12 NOTE — Therapy (Signed)
[facility] [address] [place], [place], [address] Phone: [phone]   Fax:  [phone]  Speech Language Pathology Treatment  Patient Details  Name: [name] MRN: [id number] Date of Birth: 09-27-1945 Referring Provider: Dr. Wells Guiles Tat   Encounter Date: 03/12/2017  End of Session - 03/12/17 0952    Visit Number  18    Number of Visits  23    Date for SLP Re-Evaluation  03/23/17    SLP Start Time  0850    SLP Stop Time   0935    SLP Time Calculation (min)  45 min    Activity Tolerance  Patient tolerated treatment well       Past Medical History:  Diagnosis Date  . Allergy   . Anemia   . Depression   . Parkinson's disease ([place])   . Parkinson's disease ([place]) 06/05/2012  . Raynaud's syndrome   . Thyroid disease     Past Surgical History:  Procedure Laterality Date  . APPENDECTOMY    . FRACTURE SURGERY     jaw  . HERNIA REPAIR    . MANDIBLE FRACTURE SURGERY    . ROTATOR CUFF REPAIR    . TONSILLECTOMY    . VASECTOMY    . WRIST FRACTURE SURGERY      There were no vitals filed for this visit.         ADULT SLP TREATMENT - 03/12/17 0850      General Information   Behavior/Cognition  Alert;Cooperative;Pleasant mood    Patient Positioning  Upright in chair      Treatment Provided   Treatment provided  Dysphagia      Dysphagia Treatment   Temperature Spikes Noted  No    Respiratory Status  Room air    Oral Cavity - Dentition  Adequate natural dentition    Treatment Methods  Therapeutic exercise;Compensation strategy training;Patient/caregiver education    Patient observed directly with PO's  Yes    Type of PO's observed  Dysphagia 1 (puree);Thin liquids    Feeding  Able to feed self    Liquids provided via  Cup    Pharyngeal Phase Signs & Symptoms  Immediate throat clear    Type of cueing  Verbal    Amount of cueing  Independent    Other treatment/comments  Pt told SLP of 2  strategies to reduce risk for PNA, including effortful and dry swallows, use of mouthwash/oral hygiene. He demo'd HEP for RMT and dysphagia with rare min A (2 verbal cues for swallow to maintain adequate lip seal). Education provided to pt and wife re: ongoing maintenance of swallowing exercises and RMT, aspiration precautions and monitoring for s/sx of PNA. Pt's loud /a/ measured average of 89.6, spontaneous conversation averaged 68 dB at 30 cm. Pt requested applesauce for taking medication (time release so unable to crush). Subtle throat clearing noted after swallow; pt followed with dry swallows and sip of thin water to clear.        Assessment / Recommendations / Plan   Plan  Continue with current plan of care      Dysphagia Recommendations   Diet recommendations  Regular;Dysphagia 3 (mechanical soft);Thin liquid as tolerated    Liquids provided via  Cup    Medication Administration  Crushed with puree    Supervision  Intermittent supervision to cue for compensatory strategies    Compensations  Slow rate;Small sips/bites;Multiple dry swallows after each bite/sip;Follow solids with liquid  Postural Changes and/or Swallow Maneuvers  Seated upright 90 degrees      General Recommendations   Oral Care Recommendations  Oral care before and after PO    Follow up Recommendations  Other (comment) f/u screening in 6 months      Progression Toward Goals   Progression toward goals  Goals met, education completed, patient discharged from [facility] Education - 03/12/17 0951    Education provided  Yes    Education Details  maintain HEP at home and swallowing precautions, monitor for s/sx of PNA and consult MD if noted    Person(s) Educated  Patient;Spouse    Methods  Explanation;Handout    Comprehension  Verbalized understanding       SLP Short Term Goals - 03/12/17 0947      SLP SHORT TERM GOAL #1   Title  Pt will average 90dB on loud "AH" with rare min A over 3 sessions.    Status   Achieved      SLP SHORT TERM GOAL #2   Title  Pt will perform HEP for dysphagia and dysarthria with occasional min A over 3 sessions    Status  Partially Met      SLP SHORT TERM GOAL #3   Title  Pt will follow diet modificiations/swallow precautions with occasional min A over 2 sessions    Status  Achieved      SLP SHORT TERM GOAL #4   Title  Pt will average 72dB on structured speech tasks 10/12 sentences with occasional min A    Status  Partially Met      SLP SHORT TERM GOAL #5   Title  Pt will utlize compensations for dysarthria/slur at sentence level 10/12 sentences with occasional min A    Status  Achieved       SLP Long Term Goals - 03/12/17 2023      SLP LONG TERM GOAL #1   Title  Pt will average 92dB on loud "AH" over 3 sessions with rare min A    Status  Deferred      SLP LONG TERM GOAL #2   Title  Pt will complete HEP for dysphagia with rare min A over 2 sessions    Baseline  03/05/08, 03/12/17    Time  2    Period  Weeks    Status  Achieved      SLP LONG TERM GOAL #4   Title  Pt will average 70 dB over 15 minute conversation with rare min A over 3 sessions    Time  2    Period  Weeks    Status  Deferred      SLP LONG TERM GOAL #5   Title  Pt will be audible and intelligible in noisy environment during 15 minute conversation over 2 sessions.    Time  2    Period  Weeks    Status  Deferred      SLP LONG TERM GOAL #6   Title  Pt will adhere to completion of HEP 5/7 days per pt/family report over 2 sessions.    Status  Achieved      SLP LONG TERM GOAL #7   Title  Pt will tell SLP at least 2 ways to reduce risk for aspiration PNA.    Time  2    Period  Weeks    Status  Achieved       Plan - 03/12/17 3435  Clinical Impression Statement  Pt and wife report improvements in eating at home and feel pt is benefitting from his swallowing exercises and respiratory training. Pt demo'd HEP with rare min A today. Subjectively, noting improvements in  intelligibility; pt's speech still averaging sub WNL (upper 60s dB), however pt responding spontaneously to SLP in low 70s dB frequently. Pt has met long term goals for dysphagia and is in agreement with d/c at this time. Verbalizes intent to continue HEP at home; will f/u in 6 months for screening.    Speech Therapy Frequency  -- d/c    Treatment/Interventions  SLP instruction and feedback;Internal/external aids;Compensatory strategies;Environmental controls;Oral motor exercises;Patient/family education;Pharyngeal strengthening exercises;Functional tasks;Language facilitation    Potential to Achieve Goals  Good    SLP Home Exercise Plan  RMT, oropharyngeal exercises    Consulted and Agree with Plan of Care  Patient;Family member/caregiver    Family Member Consulted  spouse [name]       Patient will benefit from skilled therapeutic intervention in order to improve the following deficits and impairments:   Dysphagia, oropharyngeal phase  Dysarthria and anarthria    Problem List Patient Active Problem List   Diagnosis Date Noted  . Lumbar back pain with radiculopathy affecting right lower extremity 09/19/2016  . Elevated transaminase level 12/20/2015  . Chronic fatigue 12/02/2015  . Raynaud's phenomenon 04/16/2014  . Insomnia 03/31/2014  . Syncope 11/11/2012  . Autonomic postural hypotension 11/11/2012  . Parkinson's disease ([place]) 06/05/2012  . Restless leg syndrome 11/15/2011  . Primary hypothyroidism 09/11/2011  . Parkinsonism ([place]) 04/02/2011  . Depression, major, recurrent, in partial remission ([place]) 04/02/2011   SPEECH THERAPY DISCHARGE SUMMARY  Visits from Start of Care: 18  Current functional level related to goals / functional outcomes: See goals above. Pt was seen for 18 visits targeting dysarthria and dysphagia secondary to Parkinson's disease. Intelligibility goals were deferred at pt experienced a decline in swallow function; MBS was repeated 02/01/17 with findings of  moderate oral and severe pharyngo-cervical esophageal dysphagia, mostly silent aspiration and probable chronic low-grade silent aspiration. SLP targeted respiratory muscle strength training and oropharyngeal swallowing exercises, and pt has reported functional improvements in recent weeks. Long term goals met.   Remaining deficits:  Pt continues to display deficits in intelligibility and chronic dysphagia secondary to Parkinson's disease, requiring use of compensatory swallow maneuvers and aspiration risk is moderate-severe per MBS.   Education / Equipment: Ongoing HEP maintenance, strict and thorough oral care, monitoring for s/sx of aspiration PNA  Plan: Patient agrees to discharge.  Patient goals were met. Patient is being discharged due to meeting the stated rehab goals.  ?????          Deneise Lever, [place], CCC-SLP Speech-Language Pathologist  Aliene Altes 03/12/2017, 9:56 AM  [facility] [address] [place], [place], [address] Phone: [phone]   Fax:  [phone]   Name: [name] MRN: [id number] Date of Birth: [birth year]

## 2017-03-12 NOTE — Patient Instructions (Signed)
Continue swallowing and respiratory training exercises as below. Continue use of hard swallows and take extra swallows when you eat or drink. Take medications with applesauce; crushed is better but must be OK'd by your doctor or pharmacist. Keep up with regular oral hygiene before and after eating, use an alcohol-free rinse. Monitor for signs of pneumonia and consult doctor if you have fever (can be low grade), coughing, chest congestion, greenish or foul-smelling sputum, shortness of breath. Return for follow-up screening in 6 months.  SWALLOWING EXERCISES Do these 2-3 x every day  1. HARD Swallows- fast and strong - Take sips if you need to - 25times, 3sets -Also use whenever you eat or drink  2. Tongue pull-backs - Hold your tongue with a piece of gauze or paper towel and hold for 3-5 seconds - 3 sets of 10  3. Shaker Exercise (do the whole set 2-3 x per day) A. Lie down, raise your head, look at your feet for 45-60 seconds - 3 times, rest for 45 seconds in between B. Lie down, raise your head to look at your feet, do 30 one-second holds        4.Tongue Press            Press your entire tongue to roof of mouth and hold for 3 seconds.             3 sets of 10         5. Lip closure              Place your finger at the bottom of a straw. Close your lips around the top and suck in for 3 seconds.             3 sets of 10  You have been issued a Respiratory Muscle Strength Trainer(s) to increase lung strength for improved speech and/or swallowing abilities. Please follow the instructions below provided by your Speech-Language Pathologist to complete your exercises.  Expiratory Muscle Training  Blue Device - "Blue Equals Blow"   Perform  5 repetitions, 5 times per day  Device set at 2 quarter turns past 30  How to perform:  Take a big breath, then blow hard into the trainer (device)  Rest for at least 2  seconds between repetitions  What you should feel/hear: Burst of air through the device  Inspiratory Muscle Training Clear Device - "Suck in"   Perform 5 repetitions, 5 times per day  Device set at 17 cm H2O  How to perform:  Place trainer in your mouth and then suck into the trainer (device)  Rest for at least 2 seconds between repetitions  What you should feel/hear: Burst of air through the device  Take a break or discontinue use if you experience lightheadedness, dizziness, pain, shortness of breath, color change, or excessive sweating. Any questions, call [phone]. Please bring your device(s) to therapy sessions.

## 2017-03-18 ENCOUNTER — Encounter: Payer: Self-pay | Admitting: Neurology

## 2017-03-19 MED ORDER — CLONAZEPAM 0.5 MG PO TABS: 0.2500 mg | ORAL_TABLET | Freq: Every day | ORAL | 1 refills | Status: DC

## 2017-03-19 NOTE — Telephone Encounter (Signed)
Jade, okay for klonopin, 0.5 mg, 1/2 tablet at night.

## 2017-04-02 ENCOUNTER — Encounter: Payer: Self-pay | Admitting: Physician Assistant

## 2017-04-02 ENCOUNTER — Other Ambulatory Visit: Payer: Self-pay | Admitting: Physician Assistant

## 2017-04-02 DIAGNOSIS — R0989 Other specified symptoms and signs involving the circulatory and respiratory systems: Secondary | ICD-10-CM

## 2017-04-02 DIAGNOSIS — R1310 Dysphagia, unspecified: Secondary | ICD-10-CM

## 2017-04-02 DIAGNOSIS — R05 Cough: Secondary | ICD-10-CM

## 2017-04-02 DIAGNOSIS — R058 Other specified cough: Secondary | ICD-10-CM

## 2017-04-02 NOTE — Telephone Encounter (Incomplete)
This may be the pharmacy.  Please ask patient if he needs this refill.  He has a pcp very much established.

## 2017-04-02 NOTE — Telephone Encounter (Signed)
Please review note and mychart message and advise. Thank you

## 2017-04-06 ENCOUNTER — Encounter: Payer: Self-pay | Admitting: Family Medicine

## 2017-04-06 ENCOUNTER — Ambulatory Visit (INDEPENDENT_AMBULATORY_CARE_PROVIDER_SITE_OTHER): Payer: Medicare Other | Admitting: Family Medicine

## 2017-04-06 VITALS — BP 142/88 | HR 64 | Temp 97.5°F | Resp 18 | Ht 68.0 in | Wt 125.0 lb

## 2017-04-06 DIAGNOSIS — E039 Hypothyroidism, unspecified: Secondary | ICD-10-CM

## 2017-04-06 DIAGNOSIS — R1319 Other dysphagia: Secondary | ICD-10-CM

## 2017-04-06 DIAGNOSIS — G20 Parkinson's disease: Secondary | ICD-10-CM | POA: Diagnosis not present

## 2017-04-06 MED ORDER — LEVOTHYROXINE SODIUM 88 MCG PO TABS: 88.0000 ug | ORAL_TABLET | Freq: Every day | ORAL | 2 refills | Status: DC

## 2017-04-06 NOTE — Patient Instructions (Addendum)
  Start lower dose of thyroid medication, keep follow-up with endocrinologist next month. I will check baseline thyroid test today, but that likely will be checked at your next visit with endocrinology. Keep follow-up with neurologist, speech therapist. No change in other medicines today. Follow-up with me in 3 months for a physical, sooner if needed. Thank you for coming in today.   IF you received an x-ray today, you will receive an invoice from [facility]. Please contact [facility] at [phone] with questions or concerns regarding your invoice.   IF you received labwork today, you will receive an invoice from [facility]. Please contact [facility] at [phone] with questions or concerns regarding your invoice.   Our billing staff will not be able to assist you with questions regarding bills from these companies.  You will be contacted with the lab results as soon as they are available. The fastest way to get your results is to activate your My Chart account. Instructions are located on the last page of this paperwork. If you have not heard from [place] regarding the results in 2 weeks, please contact this office.

## 2017-04-06 NOTE — Progress Notes (Signed)
Subjective:  By signing my name below, I, Moises Blood, attest that this documentation has been prepared under the direction and in the presence of [name], MD. Electronically Signed: Moises Blood, [place]. 04/06/2017 , 11:55 AM .  Patient was seen in Room 8 .   Patient ID: [name], male    DOB: 1945-03-06, 72 y.o.   MRN: [id number] Chief Complaint  Patient presents with  . Establish Care    wants Dr. Carlota Raspberry to become his PCP; no other issues today to discuss   HPI [name] is a 72 y.o. male Here to establish care; although, I have seen him for other issues in the past including dizziness, depression, and hypothyroidism in 2016 and 2017. He has a history of parkinson's, hypothyroidism, and Raynaud's syndrome.   [11:55 AM] Multiple previous records were reviewed over 15 minutes.   Dysphagia He was most recently seen in Dec by Ivar Drape, PA-C for dysphagia and cough. He was referred to speech pathology for repeat swallowing function testing; treated with Prilosec 20mg  QD. He had speech therapy on Jan 21st, 2019, recommended dysphagia 3 diet with thin liquid as tolerated. He had a modified barium swallow on 02/01/17, which showed moderate oral, severe pharyngeal cervical, and esophageal dysphagia, mostly silent aspiration and possible low grade silent aspiration.   Parkinson's He is followed by Dr. Carles Collet, last office visit on Jan 14th. He does have history of orthostatic hypotension. Recommended hydration and compression binder given. He was continued on Requip but dosage decreased due to visual distortion and memory change, new dose of 1mg  TID. He is also taking Sinemet-CR QHS and Sinemet-IR QID.   Hypothyroidism He was referred to endocrinology (Dr. Cruzita Lederer) by neurology; appointment scheduled for March 20th.   Lab Results  Component Value Date   TSH 0.02 (L) 03/02/2017   He had new prescription for synthroid 149mcg; but he hasn't been taking any  because he wants to be seen by endocrinologist first. He last took thyroid medication about a month ago. He was on medication (1 tablet a day, but unsure dosage) when last TSH checked on Mar 02, 2017. He mentions he's been feeling more fatigue recently.    Patient Active Problem List   Diagnosis Date Noted  . Lumbar back pain with radiculopathy affecting right lower extremity 09/19/2016  . Elevated transaminase level 12/20/2015  . Chronic fatigue 12/02/2015  . Raynaud's phenomenon 04/16/2014  . Insomnia 03/31/2014  . Syncope 11/11/2012  . Autonomic postural hypotension 11/11/2012  . Parkinson's disease ([place]) 06/05/2012  . Restless leg syndrome 11/15/2011  . Primary hypothyroidism 09/11/2011  . Parkinsonism ([place]) 04/02/2011  . Depression, major, recurrent, in partial remission ([place]) 04/02/2011   Past Medical History:  Diagnosis Date  . Allergy   . Anemia   . Depression   . Parkinson's disease ([place])   . Parkinson's disease ([place]) 06/05/2012  . Raynaud's syndrome   . Thyroid disease    Past Surgical History:  Procedure Laterality Date  . APPENDECTOMY    . FRACTURE SURGERY     jaw  . HERNIA REPAIR    . MANDIBLE FRACTURE SURGERY    . ROTATOR CUFF REPAIR    . TONSILLECTOMY    . VASECTOMY    . WRIST FRACTURE SURGERY     Allergies  Allergen Reactions  . Lamictal [Lamotrigine] Other (See Comments)    Blisters in mouth  . Nsaids Other (See Comments)    Bleeding in stool   . Penicillins Rash    .[place] Kitchen  Has patient had a PCN reaction causing immediate rash, facial/tongue/throat swelling, SOB or lightheadedness with hypotension: No Has patient had a PCN reaction causing severe rash involving mucus membranes or skin necrosis: No Has patient had a PCN reaction that required hospitalization No Has patient had a PCN reaction occurring within the last 10 years: No If all of the above answers are "NO", then may proceed with Cephalosporin use.    Prior to Admission medications   Medication  Sig Start Date End Date Taking? Authorizing Provider  aspirin 81 MG tablet Take 160 mg by mouth daily.   Yes [provider]  Biotin 5000 MCG TABS Take 1 tablet by mouth daily.   Yes [provider]  carbidopa-levodopa (SINEMET CR) 50-200 MG tablet Take 1 tablet by mouth at bedtime. 10/26/16  Yes Tat, Eustace Quail, DO  carbidopa-levodopa (SINEMET IR) 25-100 MG tablet Take 1 tablet by mouth 4 (four) times daily.   Yes [provider]  clonazePAM (KLONOPIN) 0.5 MG tablet Take 0.5 tablets (0.25 mg total) by mouth at bedtime. 03/19/17  Yes Tat, Eustace Quail, DO  Multiple Vitamins-Minerals (MULTIVITAMIN WITH MINERALS) tablet Take 1 tablet by mouth daily.   Yes [provider]  omeprazole (PRILOSEC) 20 MG capsule TAKE ONE CAPSULE BY MOUTH DAILY 04/02/17  Yes English, Stephanie D, PA  rOPINIRole (REQUIP) 2 MG tablet TAKE 1/2 TABLET BY MOUTH AT 6:30 AM, 1/2 TABLET AT NOON, 1/2 TABLET AT 5:00 PM, AND 1 AND 1/2 TABLETS AT 8:00 PM 02/16/17  Yes Tat, Eustace Quail, DO   Social History   Socioeconomic History  . Marital status: Married    Spouse name: [name]  . Number of children: 2  . Years of education: 12+  . Highest education level: Not on file  Social Needs  . Financial resource strain: Not on file  . Food insecurity - worry: Not on file  . Food insecurity - inability: Not on file  . Transportation needs - medical: Not on file  . Transportation needs - non-medical: Not on file  Occupational History  . Occupation: DISABILITY    Employer: [employer]    Comment: due to Parkinson's  Tobacco Use  . Smoking status: Never Smoker  . Smokeless tobacco: Never Used  Substance and Sexual Activity  . Alcohol use: Yes    Alcohol/week: 0.0 - 4.2 oz    Comment: 1-2 beers/weeks  . Drug use: No  . Sexual activity: No  Other Topics Concern  . Not on file  Social History Narrative   Patient is married [name])  with 2 children.   Patient is right handed.   Patient has high school  education.   Patient drinks 1-2 cups daily.   Review of Systems  Constitutional: Negative for fatigue and unexpected weight change.  Eyes: Negative for visual disturbance.  Respiratory: Negative for cough, chest tightness and shortness of breath.   Cardiovascular: Negative for chest pain, palpitations and leg swelling.  Gastrointestinal: Negative for abdominal pain and blood in stool.  Neurological: Negative for dizziness, light-headedness and headaches.       Objective:   Physical Exam  Constitutional: He is oriented to person, place, and time. He appears well-developed and well-nourished. No distress.  HENT:  Head: Normocephalic and atraumatic.  Eyes: EOM are normal. Pupils are equal, round, and reactive to light.  Neck: Neck supple.  Cardiovascular: Normal rate.  Pulmonary/Chest: Effort normal. No respiratory distress.  Musculoskeletal: Normal range of motion.  Neurological: He is alert and oriented to  person, place, and time.  Skin: Skin is warm and dry.  Psychiatric: He has a normal mood and affect. His behavior is normal.  Nursing note and vitals reviewed.   Vitals:   04/06/17 1040 04/06/17 1115  BP: (!) 157/94 (!) 142/88  Pulse: 64   Resp: 18   Temp: (!) 97.5 F (36.4 C)   TempSrc: Oral   SpO2: 98%   Weight: 125 lb (56.7 kg)   Height: 5\' 8"  (1.727 m)       Assessment & Plan:   [name] is a 72 y.o. male Hypothyroidism, unspecified type - Plan: levothyroxine (SYNTHROID, LEVOTHROID) 88 MCG tablet, TSH + free T4  - Prior labs and medications reviewed. He has been off all medicines recently. Likely needs some dose of levothyroxine. I wrote for 88 g per day, check baseline TSH now, then repeat testing in 4-6 weeks, will be seen by endocrinology during that time.  Parkinson disease ([place])  -Followed by neurology with recent dosage changes on Requip. Continue routine follow-up with neuro.  Other dysphagia  -Improved after speech therapy. Continue to  follow recommendations and dysphasia diet.   Meds ordered this encounter  Medications  . levothyroxine (SYNTHROID, LEVOTHROID) 88 MCG tablet    Sig: Take 1 tablet (88 mcg total) by mouth daily.    Dispense:  30 tablet    Refill:  2   Patient Instructions    Start lower dose of thyroid medication, keep follow-up with endocrinologist next month. I will check baseline thyroid test today, but that likely will be checked at your next visit with endocrinology. Keep follow-up with neurologist, speech therapist. No change in other medicines today. Follow-up with me in 3 months for a physical, sooner if needed. Thank you for coming in today.   IF you received an x-ray today, you will receive an invoice from [facility]. Please contact [facility] at [phone] with questions or concerns regarding your invoice.   IF you received labwork today, you will receive an invoice from [place]. Please contact [facility] at [phone] with questions or concerns regarding your invoice.   Our billing staff will not be able to assist you with questions regarding bills from these companies.  You will be contacted with the lab results as soon as they are available. The fastest way to get your results is to activate your My Chart account. Instructions are located on the last page of this paperwork. If you have not heard from [place] regarding the results in 2 weeks, please contact this office.      I personally performed the services described in this documentation, which was scribed in my presence. The recorded information has been reviewed and considered for accuracy and completeness, addended by me as needed, and agree with information above.  Signed,   [name], MD [facility].  04/07/17 10:45 PM

## 2017-04-07 LAB — TSH+FREE T4
FREE T4: 0.95 ng/dL (ref 0.82–1.77)
TSH: 7.92 u[IU]/mL — AB (ref 0.450–4.500)

## 2017-04-08 ENCOUNTER — Encounter: Payer: Self-pay | Admitting: Family Medicine

## 2017-04-15 ENCOUNTER — Encounter: Payer: Self-pay | Admitting: Neurology

## 2017-04-16 MED ORDER — CARBIDOPA-LEVODOPA ER 50-200 MG PO TBCR: 1.0000 | EXTENDED_RELEASE_TABLET | Freq: Every day | ORAL | 1 refills | Status: AC

## 2017-04-16 MED ORDER — CARBIDOPA-LEVODOPA 25-100 MG PO TABS: 1.0000 | ORAL_TABLET | Freq: Four times a day (QID) | ORAL | 1 refills | Status: AC

## 2017-04-22 ENCOUNTER — Encounter: Payer: Self-pay | Admitting: Neurology

## 2017-04-22 ENCOUNTER — Encounter: Payer: Self-pay | Admitting: Family Medicine

## 2017-04-23 MED ORDER — RASAGILINE MESYLATE 1 MG PO TABS: 1.0000 mg | ORAL_TABLET | Freq: Every day | ORAL | 0 refills | Status: DC

## 2017-04-23 NOTE — Telephone Encounter (Signed)
Okay to restart azilect.  [place] recipe, lots of water and start colace

## 2017-04-29 ENCOUNTER — Encounter: Payer: Self-pay | Admitting: Physician Assistant

## 2017-04-29 DIAGNOSIS — R05 Cough: Secondary | ICD-10-CM

## 2017-04-29 DIAGNOSIS — R1310 Dysphagia, unspecified: Secondary | ICD-10-CM

## 2017-04-29 DIAGNOSIS — R0989 Other specified symptoms and signs involving the circulatory and respiratory systems: Secondary | ICD-10-CM

## 2017-04-29 DIAGNOSIS — R058 Other specified cough: Secondary | ICD-10-CM

## 2017-04-30 ENCOUNTER — Other Ambulatory Visit: Payer: Self-pay | Admitting: Physician Assistant

## 2017-04-30 DIAGNOSIS — R05 Cough: Secondary | ICD-10-CM

## 2017-04-30 DIAGNOSIS — R09A2 Foreign body sensation, throat: Secondary | ICD-10-CM

## 2017-04-30 DIAGNOSIS — R0989 Other specified symptoms and signs involving the circulatory and respiratory systems: Secondary | ICD-10-CM

## 2017-04-30 DIAGNOSIS — R058 Other specified cough: Secondary | ICD-10-CM

## 2017-04-30 DIAGNOSIS — R1310 Dysphagia, unspecified: Secondary | ICD-10-CM

## 2017-04-30 NOTE — Telephone Encounter (Signed)
Provider, medications stable at last visit but not refilled. Physical patient was instructed to return in 3 months for is scheduled on 09/25/2017 due to insurance reasons. Please sign pended medication if agreeable.

## 2017-05-01 ENCOUNTER — Other Ambulatory Visit: Payer: Self-pay | Admitting: Physician Assistant

## 2017-05-01 DIAGNOSIS — R1310 Dysphagia, unspecified: Secondary | ICD-10-CM

## 2017-05-01 DIAGNOSIS — R0989 Other specified symptoms and signs involving the circulatory and respiratory systems: Secondary | ICD-10-CM

## 2017-05-01 DIAGNOSIS — R058 Other specified cough: Secondary | ICD-10-CM

## 2017-05-01 DIAGNOSIS — R05 Cough: Secondary | ICD-10-CM

## 2017-05-01 MED ORDER — OMEPRAZOLE 20 MG PO CPDR: 20.0000 mg | DELAYED_RELEASE_CAPSULE | Freq: Every day | ORAL | 1 refills | Status: AC

## 2017-05-01 NOTE — Telephone Encounter (Signed)
See email from 04/29/17 addressing medication refill.

## 2017-05-03 NOTE — Telephone Encounter (Signed)
[place]  Prilosec  Ordered  And  Picked  Up  By  Patient

## 2017-05-09 ENCOUNTER — Ambulatory Visit (INDEPENDENT_AMBULATORY_CARE_PROVIDER_SITE_OTHER): Payer: Medicare Other | Admitting: Internal Medicine

## 2017-05-09 ENCOUNTER — Encounter: Payer: Self-pay | Admitting: Internal Medicine

## 2017-05-09 VITALS — BP 124/72 | HR 92 | Ht 68.0 in | Wt 124.6 lb

## 2017-05-09 DIAGNOSIS — E039 Hypothyroidism, unspecified: Secondary | ICD-10-CM | POA: Diagnosis not present

## 2017-05-09 NOTE — Patient Instructions (Addendum)
Please continue Levothyroxine 88 mcg daily.  Take the thyroid hormone every day, with water, at least 30 minutes before breakfast, separated by at least 4 hours from: - acid reflux medications - calcium - iron - multivitamins  If you continue Biotin, please stop this at least 1 week before next thyroid labs.  Come for labs at 8 am, in 5-6 weeks, and take Sinemet after labs. You can schedule the appt today.   Please come back for a follow-up appointment in 4 months.

## 2017-05-09 NOTE — Progress Notes (Signed)
Patient ID: [name], male   DOB: [birth year], 72 y.o.   MRN: [id number]    HPI  [name] is a 72 y.o.-year-old male, referred by his neurologist, Dr. Carles Collet, for management of uncontrolled hypothyroidism.  He is here with his wife who offers most of the history, especially related to his past medical history and thyroid medication dosing.  Pt. has been dx with hypothyroidism in 2016 >> on Levothyroxine 88 mcg now, restarted 04/23/2017. He was off for 2 months before the above test was checked.   He takes the thyroid hormone: - fasting - with water - separated by 15-20 min from b'fast (!) - no calcium, iron - + PPIs before b'fast (!) - + multivitamins before b'fast (!) - + Biotin 5000 units daily (!)  I reviewed pt's thyroid tests: Lab Results  Component Value Date   TSH 7.920 (H) 04/06/2017   TSH 0.02 (L) 03/02/2017   TSH 0.29 (L) 10/03/2016   TSH 0.24 (L) 09/19/2016   TSH 0.89 06/14/2016   TSH 0.47 03/13/2016   TSH 0.05 (L) 01/17/2016   TSH 0.23 (L) 12/02/2015   TSH 0.02 (L) 11/10/2015   TSH 0.01 (L) 09/24/2015   FREET4 0.95 04/06/2017   FREET4 0.94 09/19/2016   FREET4 1.53 01/17/2016   FREET4 1.18 12/02/2015   Pt describes: - weight loss - fatigue - cold intolerance - depression - constipation  Pt. also has a history of Parkinson disease, for which he sees Dr. Carles Collet.  He is on Sinemet.  He denies feeling nodules in neck, hoarseness, odynophagia, SOB with lying down.  She does have dysphagia, related to his Parkinson disease. He had swallowing training/therapy.   She has no FH of thyroid disorders. No FH of thyroid cancer.  No h/o radiation tx to head or neck. No recent use of iodine supplements.  ROS: Constitutional: + See HPI, nocturia Eyes: no blurry vision, no xerophthalmia ENT: no sore throat, no nodules palpated in throat, no dysphagia/odynophagia, no hoarseness Cardiovascular: no CP/SOB/palpitations/leg swelling Respiratory: no  cough/SOB Gastrointestinal: no N/V/D/C/+ acid reflux  musculoskeletal: no muscle/joint aches Skin: no rashes Neurological: no tremors/numbness/tingling/dizziness, + headaches Psychiatric: + Depression/no anxiety + Difficulty with erections  Past Medical History:  Diagnosis Date  . Allergy   . Anemia   . Depression   . Parkinson's disease ([place])   . Parkinson's disease ([place]) 06/05/2012  . Raynaud's syndrome   . Thyroid disease    Past Surgical History:  Procedure Laterality Date  . APPENDECTOMY    . FRACTURE SURGERY     jaw  . HERNIA REPAIR    . MANDIBLE FRACTURE SURGERY    . ROTATOR CUFF REPAIR    . TONSILLECTOMY    . VASECTOMY    . WRIST FRACTURE SURGERY     Social History   Socioeconomic History  . Marital status: Married    Spouse name: [name]  . Number of children: 2  . Years of education: 12+  . Highest education level: Not on file  Social Needs  . Financial resource strain: Not on file  . Food insecurity - worry: Not on file  . Food insecurity - inability: Not on file  . Transportation needs - medical: Not on file  . Transportation needs - non-medical: Not on file  Occupational History  . Occupation: DISABILITY    Employer: [employer]    Comment: due to Parkinson's  Tobacco Use  . Smoking status: Never Smoker  . Smokeless tobacco: Never Used  Substance and Sexual Activity  . Alcohol use: Yes    Alcohol/week: 0.0 - 4.2 oz    Comment: 1-2 beers/weeks  . Drug use: No  . Sexual activity: No  Other Topics Concern  . Not on file  Social History Narrative   Patient is married [name])  with 2 children.   Patient is right handed.   Patient has high school education.   Patient drinks 1-2 cups daily.   Current Outpatient Medications on File Prior to Visit  Medication Sig Dispense Refill  . aspirin 81 MG tablet Take 160 mg by mouth daily.    . Biotin 5000 MCG TABS Take 1 tablet by mouth daily.    . carbidopa-levodopa (SINEMET CR) 50-200 MG tablet Take 1  tablet by mouth at bedtime. 90 tablet 1  . carbidopa-levodopa (SINEMET IR) 25-100 MG tablet Take 1 tablet by mouth 4 (four) times daily. 360 tablet 1  . clonazePAM (KLONOPIN) 0.5 MG tablet Take 0.5 tablets (0.25 mg total) by mouth at bedtime. 45 tablet 1  . levothyroxine (SYNTHROID, LEVOTHROID) 88 MCG tablet Take 1 tablet (88 mcg total) by mouth daily. 30 tablet 2  . Multiple Vitamins-Minerals (MULTIVITAMIN WITH MINERALS) tablet Take 1 tablet by mouth daily.    Marland Kitchen omeprazole (PRILOSEC) 20 MG capsule Take 1 capsule (20 mg total) by mouth daily. 90 capsule 1  . rasagiline (AZILECT) 1 MG TABS tablet Take 1 tablet (1 mg total) by mouth daily. 90 tablet 0  . rOPINIRole (REQUIP) 2 MG tablet TAKE 1/2 TABLET BY MOUTH AT 6:30 AM, 1/2 TABLET AT NOON, 1/2 TABLET AT 5:00 PM, AND 1 AND 1/2 TABLETS AT 8:00 PM 270 tablet 0   Current Facility-Administered Medications on File Prior to Visit  Medication Dose Route Frequency Provider Last Rate Last Dose  . cyanocobalamin ((VITAMIN B-12)) injection 1,000 mcg  1,000 mcg Intramuscular Q30 days Orma Flaming, MD   1,000 mcg at 07/28/14 1344   Allergies  Allergen Reactions  . Lamictal [Lamotrigine] Other (See Comments)    Blisters in mouth  . Nsaids Other (See Comments)    Bleeding in stool   . Penicillins Rash    .Marland KitchenHas patient had a PCN reaction causing immediate rash, facial/tongue/throat swelling, SOB or lightheadedness with hypotension: No Has patient had a PCN reaction causing severe rash involving mucus membranes or skin necrosis: No Has patient had a PCN reaction that required hospitalization No Has patient had a PCN reaction occurring within the last 10 years: No If all of the above answers are "NO", then may proceed with Cephalosporin use.    Family History  Problem Relation Age of Onset  . Colon cancer Father   . Heart disease Mother   . Huntington's disease Maternal Aunt   . Huntington's disease Maternal Uncle   . Multiple sclerosis Maternal  Uncle    PE: BP 124/72   Pulse 92   Ht 5\' 8"  (1.727 m)   Wt 124 lb 9.6 oz (56.5 kg)   SpO2 94%   BMI 18.95 kg/m  Wt Readings from Last 3 Encounters:  05/09/17 124 lb 9.6 oz (56.5 kg)  04/06/17 125 lb (56.7 kg)  03/05/17 121 lb (54.9 kg)   Constitutional: Normal weight, in NAD Eyes: PERRLA, EOMI, no exophthalmos ENT: moist mucous membranes, no thyromegaly, no cervical lymphadenopathy Cardiovascular: RRR (no tachycardia at the time of the exam), No MRG Respiratory: CTA B Gastrointestinal: abdomen soft, NT, ND, BS+ Musculoskeletal: no deformities, strength intact in all 4 Skin: moist,  warm, no rashes, bluish discoloration of his fingertips (Raynaud's) Neurological: no tremor with outstretched hands, DTR normal in all 4  ASSESSMENT: 1. Hypothyroidism  PLAN:  1. Patient with long-standing hypothyroidism, uncontrolled, on levothyroxine therapy. His TSH has fluctuated in the past. Last was >7, but at that time he was off levothyroxine for approximately 2 months.  He was restarted on levothyroxine, 88 mcg daily at the beginning of this month. - Despite starting levothyroxine back, he has a lot of symptoms that are suggestive of hypothyroidism: Fatigue, constipation, depression, however, this could be definitely secondary to Parkinson's disease. - he does not appear to have a goiter, thyroid nodules, or neck compression symptoms (other than dysphagia, which is not related to thyroid enlargement) - We discussed about correct intake of levothyroxine, fasting, with water, separated by at least 30 minutes from breakfast, and separated by more than 4 hours from calcium, iron, multivitamins, acid reflux medications (PPIs).  He is not taking it correctly now.  He only separates LT4 by approximately 15 minutes from breakfast  and takes it very close to PPIs and multivitamins.  I advised them how to separate these.  We also discussed about biotin influence on the TFTs and I advised him to stay off  biotin for at least a week before we check labs again.  I also advised him to hold Sinemet the morning of the labs and take this after labs were drawn. - will continue the current dose of levothyroxine for now but check thyroid tests in 5-6 weeks after the above changes: TSH, free T4 - I will see him back in 4 months  Orders Placed This Encounter  Procedures  . T4, free    Standing Status:   Future    Standing Expiration Date:   05/09/2018  . TSH    Standing Status:   Future    Standing Expiration Date:   05/09/2018   Philemon Kingdom, MD PhD [facility] Endocrinology

## 2017-05-14 ENCOUNTER — Encounter: Payer: Self-pay | Admitting: Neurology

## 2017-05-30 ENCOUNTER — Encounter: Payer: Self-pay | Admitting: Physician Assistant

## 2017-06-11 ENCOUNTER — Ambulatory Visit: Payer: 59 | Admitting: Neurology

## 2017-06-20 ENCOUNTER — Other Ambulatory Visit (INDEPENDENT_AMBULATORY_CARE_PROVIDER_SITE_OTHER): Payer: Medicare Other

## 2017-06-20 DIAGNOSIS — E039 Hypothyroidism, unspecified: Secondary | ICD-10-CM | POA: Diagnosis not present

## 2017-06-20 LAB — T4, FREE: FREE T4: 1.21 ng/dL (ref 0.60–1.60)

## 2017-06-20 LAB — TSH: TSH: 0.04 u[IU]/mL — AB (ref 0.35–4.50)

## 2017-06-21 ENCOUNTER — Encounter: Payer: Self-pay | Admitting: Internal Medicine

## 2017-06-21 ENCOUNTER — Telehealth: Payer: Self-pay

## 2017-06-21 DIAGNOSIS — E039 Hypothyroidism, unspecified: Secondary | ICD-10-CM

## 2017-06-21 NOTE — Telephone Encounter (Signed)
-----   Message from Philemon Kingdom, MD sent at 06/21/2017 12:08 PM EDT ----- Larey Seat, can you please call pt: (he is not checking MyChart): Please make sure first that he was off his Bitin for at least 1 week before the labs were drawn, OTW, need to repeat them... TSH is too suppressed >> need to decrease the LT4 dose to 50 mcg and check again in 1.5 mo. Can you please send this dose to his pharm and d/c the old on his med list? Also, please enter the labs for 1.5 mo: TSH and fT4.

## 2017-06-22 MED ORDER — LEVOTHYROXINE SODIUM 50 MCG PO TABS: 50.0000 ug | ORAL_TABLET | Freq: Every day | ORAL | 2 refills | Status: DC

## 2017-06-22 NOTE — Telephone Encounter (Signed)
Spoke to patient and spouse. Pt stated he stopped Biotin 2 weeks before lab visit.  Gave lab results and med instructions. Pt verbalized understanding. Sent Levo 58mcg Rx to pharmacy as requested. Ordered future labs. Pt and spouse will c/b to schedule lab visit.  Pt requesting to know does he need to stop taking Biotin 1 week before future lab appt also.

## 2017-06-22 NOTE — Telephone Encounter (Signed)
Called pt. No answer.  LMTCB on home phone# per DPR.  Cell#no answer

## 2017-06-27 ENCOUNTER — Encounter: Payer: Self-pay | Admitting: Physician Assistant

## 2017-06-27 ENCOUNTER — Other Ambulatory Visit: Payer: Self-pay

## 2017-06-27 ENCOUNTER — Ambulatory Visit (INDEPENDENT_AMBULATORY_CARE_PROVIDER_SITE_OTHER): Payer: Medicare Other | Admitting: Physician Assistant

## 2017-06-27 VITALS — BP 122/72 | HR 69 | Temp 97.6°F | Ht 68.0 in | Wt 118.2 lb

## 2017-06-27 DIAGNOSIS — H1032 Unspecified acute conjunctivitis, left eye: Secondary | ICD-10-CM | POA: Diagnosis not present

## 2017-06-27 MED ORDER — POLYMYXIN B-TRIMETHOPRIM 10000-0.1 UNIT/ML-% OP SOLN: 1.0000 [drp] | OPHTHALMIC | 0 refills | Status: AC

## 2017-06-27 NOTE — Progress Notes (Signed)
06/27/2017 2:19 PM   DOB: October 01, 1945 / MRN: [id number]  SUBJECTIVE:  [name] is a 72 y.o. male presenting for left eye redness that started this morning.  Patient denies any acute changes in vision and frank eye pain.  Does describe some mild left eye irritation.  Has a history of Parkinson's disease and is managed by neurology.  Takes his medicines as prescribed.  His wife did have some leftover eyedrops and applied this this morning and tells me that I seem to improve after that.  He is allergic to lamictal [lamotrigine]; nsaids; and penicillins.   He  has a past medical history of Allergy, Anemia, Depression, Parkinson's disease ([place]), Parkinson's disease ([place]) (06/05/2012), Raynaud's syndrome, and Thyroid disease.    He  reports that he has never smoked. He has never used smokeless tobacco. He reports that he drinks alcohol. He reports that he does not use drugs. He  reports that he does not engage in sexual activity. The patient  has a past surgical history that includes Mandible fracture surgery; Hernia repair; Wrist fracture surgery; Rotator cuff repair; Tonsillectomy; Fracture surgery; Vasectomy; and Appendectomy.  His family history includes Colon cancer in his father; Heart disease in his mother; Huntington's disease in his maternal aunt and maternal uncle; Multiple sclerosis in his maternal uncle.  Review of Systems  Eyes: Positive for discharge and redness. Negative for blurred vision, double vision, photophobia and pain.    The problem list and medications were reviewed and updated by myself where necessary and exist elsewhere in the encounter.   OBJECTIVE:  BP 122/72 (BP Location: Left Arm)   Pulse 69   Temp 97.6 F (36.4 C) (Oral)   Ht 5\' 8"  (1.727 m)   Wt 118 lb 3.2 oz (53.6 kg)   SpO2 99%   BMI 17.97 kg/m   Physical Exam  Constitutional: He is oriented to person, place, and time. He appears well-developed. He does not appear ill.  HENT:  Head:     Eyes: Pupils are equal, round, and reactive to light. Conjunctivae and EOM are normal.    Cardiovascular: Normal rate.  Pulmonary/Chest: Effort normal.  Abdominal: He exhibits no distension.  Musculoskeletal: Normal range of motion.  Neurological: He is alert and oriented to person, place, and time. No cranial nerve deficit. Coordination normal.  Patient positive for masked bases and shuffling gait consistent with chronic history of Parkinson's disease.  Grip strength equal bilaterally.  Plantar and dorsiflexion strength intact to challenge bilaterally.  Finger-to-nose exam intact to challenge.  Negative for facial nerve palsy.  Cranial nerves intact to challenge.  Skin: Skin is warm and dry. He is not diaphoretic.  Psychiatric: He has a normal mood and affect.  Nursing note and vitals reviewed.   No results found for this or any previous visit (from the past 72 hour(s)).  No results found.  ASSESSMENT AND PLAN:  D[place] was seen today for eye problem.  Diagnoses and all orders for this visit:  Acute conjunctivitis of left eye, unspecified acute conjunctivitis type -     trimethoprim-polymyxin b (POLYTRIM) ophthalmic solution; Place 1 drop into the left eye every 4 (four) hours. -     Care order/instruction:    The patient is advised to call or return to clinic if he does not see an improvement in symptoms, or to seek the care of the closest emergency department if he worsens with the above plan.   Philis Fendt, MHS, PA-C [facility] 06/27/2017 2:19 PM

## 2017-06-27 NOTE — Patient Instructions (Signed)
Come back on Friday or Saturday if the eye is not improving.  Come back or go to the ED if any new neurological symptoms (worsening gait, weakness, confusion).

## 2017-07-04 NOTE — Progress Notes (Signed)
[name] was seen today in the movement disorders clinic for neurologic consultation at the request of Wendie Agreste, MD.  This patient is accompanied in the office by his spouse who supplements the history.  The consultation is for the evaluation of PD.  Pt has previously seen Dr. Rexene Alberts and I have reviewed his records in detail (went one by one through all notes, emails, phone correspondence).  Had seen Dr. Erling Cruz prior to Dr. Rexene Alberts but I don't have his notes.  I do, however, have a summary of his notes from Dr. Rexene Alberts.  The patient was diagnosed with Parkinson's disease in 2009.  He cannot remember what presenting complaint was but wife thinks it was trouble with trouble with fine motor skills on the right and he is right hand dominant.  He cannot remember if ropinirole or levodopa was his first medication (thinks ropinirole), but does remember that ropinirole in higher dosages then he currently is on caused compulsive side effects such as hypersexuality, excessive spending and daytime hypersomnolence.  Currently, he is taking ropinirole 2mg , 1/2 in the AM, 1/2 at lunch, 1/2 at dinner and 1.5 tablets at dinner.  He is taking carbidopa/levodopa 25/100, 1 tablet at 7am/10am/12pm/4pm/bedtime.   Sometimes he will forget the levodopa and he will double up on them and will feel good.     Specific Symptoms:  Tremor: rarely but does have compulsive "rubbing of thumb/finger" on the right Family hx of similar:  No. (does have a fam hx of HD and was genetically checked for that and was negative) Voice: softer than in the past (never did voice therapy) Sleep: trouble staying asleep because of back/sciatic pain/spinal stenosis  Vivid Dreams:  No.  Acting out dreams:  Rare (1 episode of falling out of bed at least a year ago but relates to 2 dogs in the bed with him) Wet Pillows: Yes.   Postural symptoms:  Yes.    Falls?  Yes.   fall in October, 2011 result in a fracture of the radius; last  October had fx of ankle - slipped getting out of shower.  Last fall was that fall in October of 2017 Bradykinesia symptoms: shuffling gait, slow movements, drooling while awake, difficulty getting out of a chair and freezing gait Loss of smell:  No. Loss of taste:  No. Urinary Incontinence:  No. Difficulty Swallowing:  Yes.   (chokes on all consistiences of food; has had an unrelated cough x 3 months) Handwriting, micrographia: Yes.   Trouble with ADL's:  No. but he is very slow  Trouble buttoning clothing: Yes.  , occasionally Depression:  Yes.  , occasionally Memory changes:  No., but wife thinks some changes Hallucinations:  Yes.   (1 time per day - once he looks a 2nd time he will know it isn't real)  visual distortions: Yes.   N/V:  No. Lightheaded:  No. (states that it resolved once azilect was d/c)  Syncope: Yes.   he did have a syncopal event in 2014 while working outside in the heat.  He did follow-up with cardiology.  It was felt vasovagal and perhaps associated with dysautonomia due to Parkinson's; had another episode in February, 2016 in which he was hospitalized for syncope.  Workup was negative.  He was not particularly orthostatic when worked up.  The patient was dizzy and diaphoretic prior to the episode.  Compression stockings were recommended.  Florinef was started in February, 2017, but he did not take that for  very long at all as he felt it caused side effects.  Pt doesn't even remember the medication Diplopia:  No. Dyskinesia:  No. Cramping of feet and legs at night:  Yes  Neuroimaging of the brain has previously been performed.  MRI done of the brain in 2009 is reported to be normal.  Films are unavailable.  03/05/17 update:  Pt seen today in f/u for parkinsons.  I changed his meds last visit so he is taking carbidopa/levodopa 25/100 four times per day (5:30am/8:30pm/lunch/4:30 pm) and added carbidopa/levodopa 50/200 at bedtime.  I was hoping that would help with cramping  at bedtime.  He states that they are still cramping some.  He is also on ropinirole, 2 mg, 0.5 tablets/0.5 tablets/0.5 tablets / 1.5 tablets.  He has had no compulsive behaviors.  No sleep attacks.  He did attend therapy at the rehab center.  I have reviewed those notes.  Pt found that to be really helpful.  The patient was supposed to have a driving evaluation completed.  He reports that he did not do the evaluation but he is driving "very little."  He has had injections for his back.  Only walking for exercise.  When I asked patient about memory/thinking/mood, pt states that it is good but wife states that he is sometimes out of it and pt thinks it is from his thyroid medication.    They had this rechecked on Friday, and it was still 0.02.  They ask me for a referral to endocrinology.  prilosec is helping his GERD.  He reports that he has passed out a few times.  He states that he would stand up and just "blacked out."  He was very lightheaded after standing.  Denies hallucinations now.  07/06/17 update: Patient is seen today in follow-up for Parkinson's.  I reduced his ropinirole since last visit to 1 mg 3 times per day, primarily because of visual distortions and cognitive change.  He reports that visual distortions are gone.  Memory is stable.   He remains on carbidopa/levodopa 25/100, 1 tablet 4 times per day and carbidopa/levodopa 50/200 at bedtime.   He notes wearing off of carbidopa/levodopa 30 min before next dosage.   He did call since our last visit and requested Xanax for sleep.  This was not recommended, but I did give him low-dose clonazepam, 0.25 mg nightly.  He reports that this is helpful and doesn't think that fatigue is related to this (was prior to starting it).  Does state that thyroid medication is being adjusted and thought that was perhaps causing fatigue.  He emailed in March and asked me to put him back on Azilect, which we did, although I was not convinced that this was going to make a  significant difference.  He did not note any benefit with that.  He also asked about constipation and was given the rancho recipe and recommended Colace.  He has seen his primary care physician since last visit and those records are reviewed.  He has also seen endocrinology.  He is walking for exercise.  He did get significantly choked at restaurant.  Went to bathroom and pt put hands around throat and someone saw him and had to do the Heimlich and it took 4 thrusts to get it out.  He was eating chicken liver.  He had a MBE is 01/2017 and there was mod to severe pharyngo-cervical esophageal dysphagia.    tx was recommended with ST, which he completed.  PREVIOUS MEDICATIONS: Elonda Husky (went off azilect to try this and felt had SE and then went back to azilect); azilect (several trials); ropinirole; florinef; carbidopa/levodopa 25/100  ALLERGIES:   Allergies  Allergen Reactions  . Lamictal [Lamotrigine] Other (See Comments)    Blisters in mouth  . Nsaids Other (See Comments)    Bleeding in stool   . Penicillins Rash    .Marland KitchenHas patient had a PCN reaction causing immediate rash, facial/tongue/throat swelling, SOB or lightheadedness with hypotension: No Has patient had a PCN reaction causing severe rash involving mucus membranes or skin necrosis: No Has patient had a PCN reaction that required hospitalization No Has patient had a PCN reaction occurring within the last 10 years: No If all of the above answers are "NO", then may proceed with Cephalosporin use.     CURRENT MEDICATIONS:  Outpatient Encounter Medications as of 07/06/2017  Medication Sig  . aspirin 81 MG tablet Take 160 mg by mouth daily.  . Biotin 5000 MCG TABS Take 1 tablet by mouth daily.  . carbidopa-levodopa (SINEMET CR) 50-200 MG tablet Take 1 tablet by mouth at bedtime.  . carbidopa-levodopa (SINEMET IR) 25-100 MG tablet Take 1 tablet by mouth 4 (four) times daily.  . clonazePAM (KLONOPIN) 0.5 MG tablet Take 0.5 tablets (0.25 mg  total) by mouth at bedtime.  Marland Kitchen levothyroxine (SYNTHROID, LEVOTHROID) 50 MCG tablet Take 1 tablet (50 mcg total) by mouth daily.  . Multiple Vitamins-Minerals (MULTIVITAMIN WITH MINERALS) tablet Take 1 tablet by mouth daily.  Marland Kitchen omeprazole (PRILOSEC) 20 MG capsule Take 1 capsule (20 mg total) by mouth daily.  . rasagiline (AZILECT) 1 MG TABS tablet Take 1 tablet (1 mg total) by mouth daily.  Marland Kitchen rOPINIRole (REQUIP) 2 MG tablet TAKE 1/2 TABLET BY MOUTH AT 6:30 AM, 1/2 TABLET AT NOON, 1/2 TABLET AT 5:00 PM, AND 1 AND 1/2 TABLETS AT 8:00 PM  . trimethoprim-polymyxin b (POLYTRIM) ophthalmic solution Place 1 drop into the left eye every 4 (four) hours.   Facility-Administered Encounter Medications as of 07/06/2017  Medication  . cyanocobalamin ((VITAMIN B-12)) injection 1,000 mcg    PAST MEDICAL HISTORY:   Past Medical History:  Diagnosis Date  . Allergy   . Anemia   . Depression   . Parkinson's disease ([place])   . Parkinson's disease ([place]) 06/05/2012  . Raynaud's syndrome   . Thyroid disease     PAST SURGICAL HISTORY:   Past Surgical History:  Procedure Laterality Date  . APPENDECTOMY    . FRACTURE SURGERY     jaw  . HERNIA REPAIR    . MANDIBLE FRACTURE SURGERY    . ROTATOR CUFF REPAIR    . TONSILLECTOMY    . VASECTOMY    . WRIST FRACTURE SURGERY      SOCIAL HISTORY:   Social History   Socioeconomic History  . Marital status: Married    Spouse name: [name]  . Number of children: 2  . Years of education: 12+  . Highest education level: Not on file  Occupational History  . Occupation: DISABILITY    Employer: [employer]ABILITY    Comment: due to [place]  . Financial resource strain: Not on file  . Food insecurity:    Worry: Not on file    Inability: Not on file  . Transportation needs:    Medical: Not on file    Non-medical: Not on file  Tobacco Use  . Smoking status: Never Smoker  . Smokeless tobacco: Never Used  Substance  and Sexual Activity  . Alcohol  use: Yes    Alcohol/week: 0.0 - 4.2 oz    Comment: 1-2 beers/weeks  . Drug use: No  . Sexual activity: Never  Lifestyle  . Physical activity:    Days per week: Not on file    Minutes per session: Not on file  . Stress: Not on file  Relationships  . Social connections:    Talks on phone: Not on file    Gets together: Not on file    Attends religious service: Not on file    Active member of club or organization: Not on file    Attends meetings of clubs or organizations: Not on file    Relationship status: Not on file  . Intimate partner violence:    Fear of current or ex partner: Not on file    Emotionally abused: Not on file    Physically abused: Not on file    Forced sexual activity: Not on file  Other Topics Concern  . Not on file  Social History Narrative   Patient is married [name])  with 2 children.   Patient is right handed.   Patient has high school education.   Patient drinks 1-2 cups daily.    FAMILY HISTORY:   Family Status  Relation Name Status  . Father  Deceased at age 35       colon cancer  . Mother  Deceased at age 46       failure to thrive  . Daughter  Alive  . Son  Alive  . MGM  Deceased  . MGF  Deceased  . PGM  Deceased  . PGF  Deceased  . Mat Aunt  Deceased  . Mat Uncle  Deceased    ROS:  A complete 10 system review of systems was obtained and was unremarkable apart from what is mentioned above.  PHYSICAL EXAMINATION:    VITALS:   There were no vitals filed for this visit.  GEN:  The patient appears stated age and is in NAD. HEENT:  Normocephalic, atraumatic.  The mucous membranes are moist. The superficial temporal arteries are without ropiness or tenderness. CV:  RRR Lungs:  CTAB Neck/HEME:  There are no carotid bruits bilaterally.  Neurological examination:  Orientation:  Montreal Cognitive Assessment  10/26/2016  Visuospatial/ Executive (0/5) 2  Naming (0/3) 3  Attention: Read list of digits (0/2) 1  Attention: Read list of  letters (0/1) 1  Attention: Serial 7 subtraction starting at 100 (0/3) 2  Language: Repeat phrase (0/2) 2  Language : Fluency (0/1) 0  Abstraction (0/2) 2  Delayed Recall (0/5) 4  Orientation (0/6) 6  Total 23  Adjusted Score (based on education) 24   Cranial nerves: There is good facial symmetry except L pseudoptosis from lid lag. There is facial hypomimia.  Pupils are equal round and reactive to light bilaterally. Fundoscopic exam is attempted but the disc margins are not well visualized bilaterally. Extraocular muscles are intact. The visual fields are full to confrontational testing. The speech is fluent and mildly dysarthric.   Soft palate rises symmetrically and there is no tongue deviation. Hearing is intact to conversational tone. Sensation: Sensation is intact to light touch throughout Motor: Strength is 5/5 in the bilateral upper and lower extremities.   Shoulder shrug is equal and symmetric.  There is no pronator drift.   Movement examination: Tone: There is mild increased tone in the RUE and mild in the LLE (same as last  visit) Abnormal movements: there is mild dyskinesia of the right arm Coordination:  There is decremation with RAM's,  including alternating supination and pronation of the forearm bilaterally and toe taps on the left Gait and Station: The patient has no difficulty arising out of a deep-seated chair without the use of the hands. The patient has dyskinesia of the R arm with ambulation only  ASSESSMENT/PLAN:  1.  Parkinsonism.  He was dx in 2009.  His PD has now been complicated by dysautonomia with syncope, falls  -continue Ropinirole, 1 mg 3 times per day.  Will likely reduce in the future.  -continue Carbidopa/levodopa 25/100, 1 tablet 4 times per day and carbidopa/levodopa 50/200 at night.  -add entacapone - 200 mg - 1 tablet 4 times per day with each levodopa dosage  -Patient felt like going off Azilect was detrimental and he is back on this.  He went off of  it and back on it and felt it wasn't beneficial.  We will d/c that.    -discussed community exercise programs  -discussed importance of regular schedule and what that means  -he asked me about inbrija but after discussion, he doesn't need that.   2.  Fam hx of Huntington Disease  -pt was genetically tested per records (don't have the actual testing) and was negative  3.  Dysphagia  -had MBE in December.  there was mod to severe pharyngo-cervical esophageal dysphagia.  He did the recommended ST.  He is to eat soft foods and eat slow   4.  Abnormal TSH  -he asks me for referral to endocrinology.  Will refer to Dr. Renne Crigler.    5.  Orthostatic hypotension with recent syncope  -talked about hydration and importance of water.  Drinking one bottle of water per day.  -should not drive.  Discussed  driving laws  -compression binder given  6.  Constipation  -Importance of hydration discussed.  -Rancho recipe given.  Colace encouraged.  7.  Insomnia  -Now on clonazepam, 0.25 mg nightly.   This has helped  8.  Follow up is anticipated in the next few months, sooner should new neurologic issues arise.  Much greater than 50% of this visit was spent in counseling and coordinating care.  Total face to face time:  35 min   Cc:  Wendie Agreste, MD

## 2017-07-05 ENCOUNTER — Ambulatory Visit: Payer: Medicare Other | Admitting: Family Medicine

## 2017-07-06 ENCOUNTER — Encounter: Payer: Self-pay | Admitting: Neurology

## 2017-07-06 ENCOUNTER — Ambulatory Visit (INDEPENDENT_AMBULATORY_CARE_PROVIDER_SITE_OTHER): Payer: Medicare Other | Admitting: Neurology

## 2017-07-06 VITALS — BP 102/66 | HR 72 | Ht 68.0 in | Wt 120.0 lb

## 2017-07-06 DIAGNOSIS — G249 Dystonia, unspecified: Secondary | ICD-10-CM | POA: Diagnosis not present

## 2017-07-06 DIAGNOSIS — R1319 Other dysphagia: Secondary | ICD-10-CM | POA: Diagnosis not present

## 2017-07-06 DIAGNOSIS — G20 Parkinson's disease: Secondary | ICD-10-CM

## 2017-07-06 MED ORDER — ENTACAPONE 200 MG PO TABS: 200.0000 mg | ORAL_TABLET | Freq: Four times a day (QID) | ORAL | 5 refills | Status: AC

## 2017-07-06 NOTE — Patient Instructions (Signed)
1.  Stop azilect 2.  Start entacapone - 200 mg - 1 tablet 4 times per day with each levodopa dosage (except the bedtime one) 3.  You need to engage in exercise   [facility] Parkinson's [facility] Movement [facility]  The [facility] team know that living well with a movement disorder extends far beyond our clinic walls. We are together with you. Our team is passionate about providing resources to you and your loved ones who are living with Parkinson's disease and movement disorders. Participate in these programs and join our community. These resources are free or low cost!   [facility] is adding:   Innovative educational programs for patients and caregivers.   Support groups for patients and caregivers living with Parkinson's disease.   Parkinson's specific exercise programs.   Custom tailored therapeutic programs that will benefit patient's living with Parkinson's disease.   We are in this together. You can help and contribute to grow these programs and resources in our community. 100% of the funds donated to the [facility] stays right here in our community to support patients and their caregivers.  To make a tax deductible contribution:  -ask for a [facility] Parkinson's envelope in the office today.  - call the Office of Institutional Advancement at [phone].     Registration is OPEN!    Third Annual Parkinson's Education Symposium   To register: [email or web address]      Search:  [employer] person attending individually Questions: [place], [place] or [email or web address] [email or web address]

## 2017-07-30 ENCOUNTER — Telehealth: Payer: Self-pay | Admitting: Neurology

## 2017-07-30 MED ORDER — ROPINIROLE HCL 1 MG PO TABS: 1.0000 mg | ORAL_TABLET | Freq: Three times a day (TID) | ORAL | 1 refills | Status: AC

## 2017-07-30 NOTE — Telephone Encounter (Signed)
Needed refill of Requip. Medication sent to pharmacy.

## 2017-08-07 ENCOUNTER — Other Ambulatory Visit (INDEPENDENT_AMBULATORY_CARE_PROVIDER_SITE_OTHER): Payer: Medicare Other

## 2017-08-07 DIAGNOSIS — E039 Hypothyroidism, unspecified: Secondary | ICD-10-CM

## 2017-08-07 LAB — TSH: TSH: 2.09 u[IU]/mL (ref 0.35–4.50)

## 2017-08-07 LAB — T4, FREE: FREE T4: 0.88 ng/dL (ref 0.60–1.60)

## 2017-08-08 IMAGING — DX DG CHEST 1V PORT
1 series · 2 of 2 positions shown · non-contrast
Comparison: 12/15/2015.  03/30/2014.

CLINICAL DATA: Cough.

EXAM:
PORTABLE CHEST 1 VIEW

[Series 1: chest ap · 0.14mm/px · 2 of 2 slices shown]
[im 1/2]
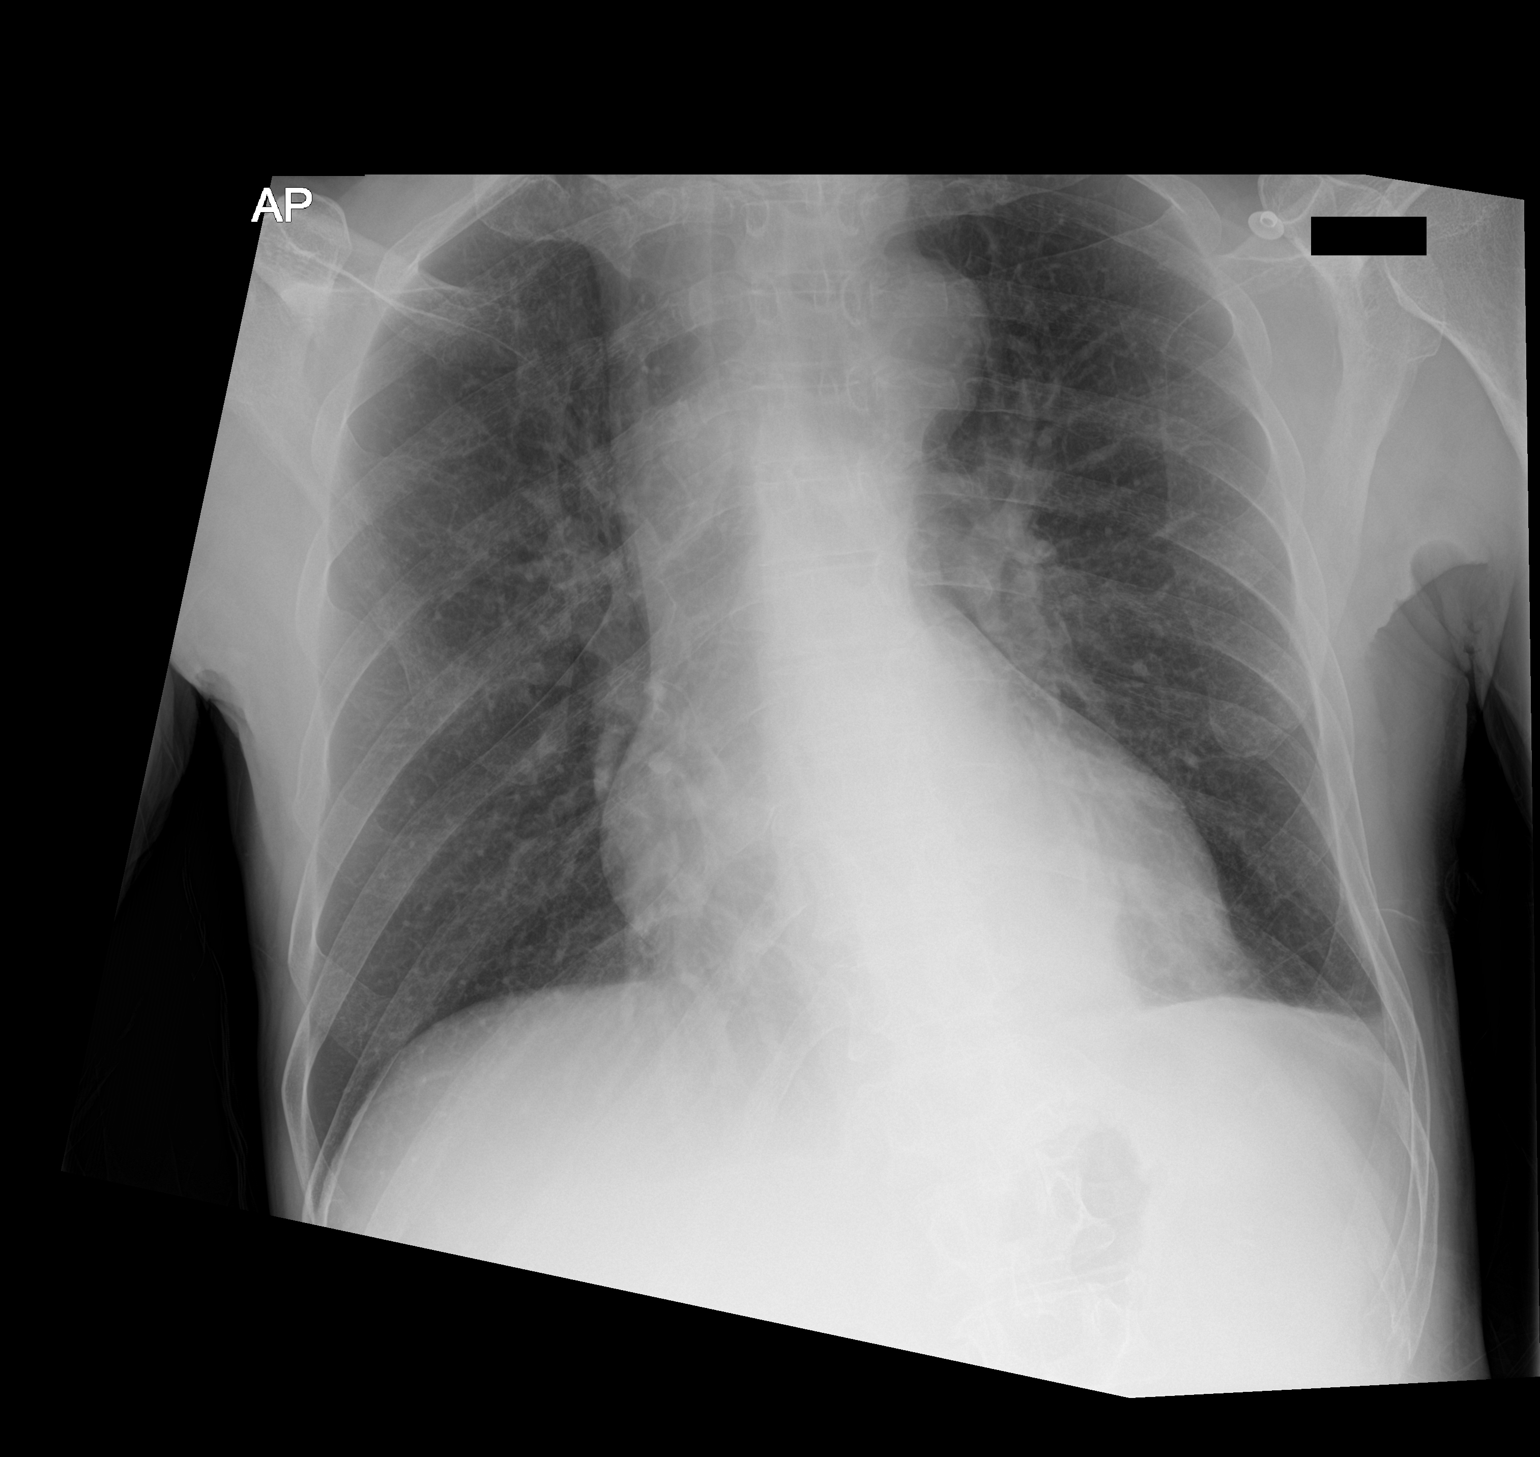
[im 2/2]
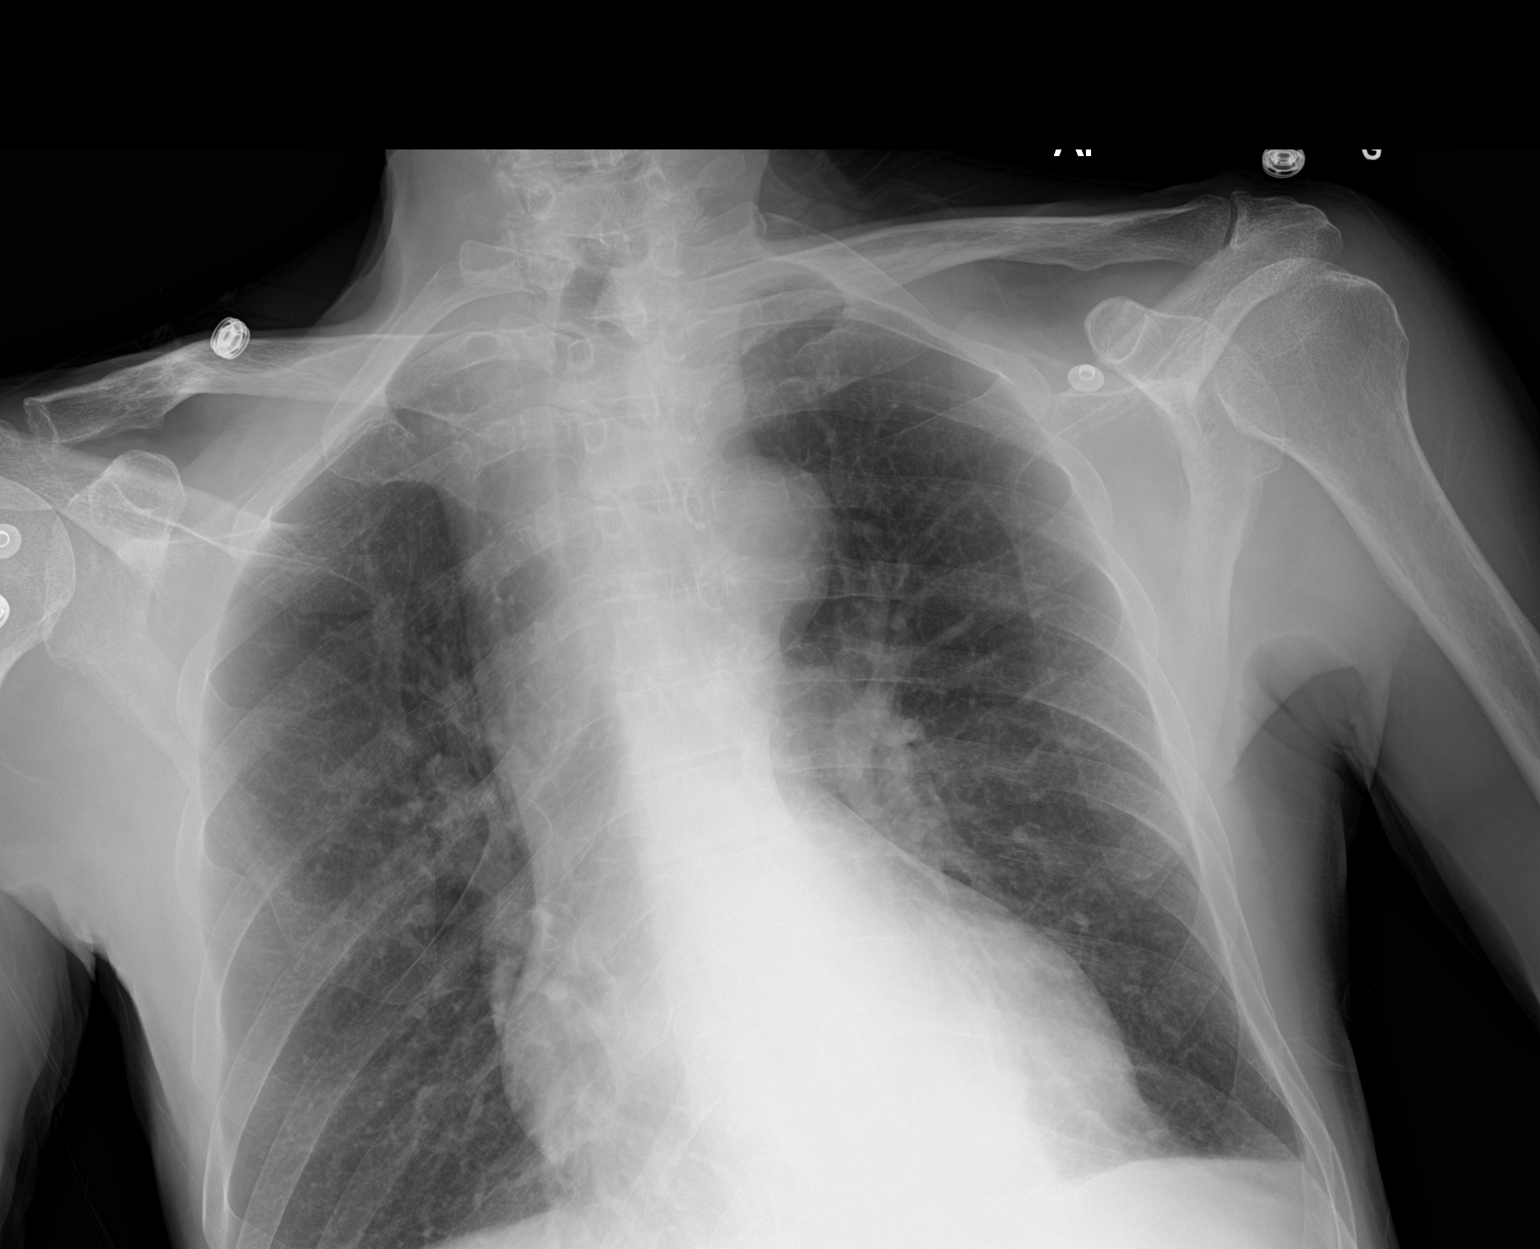

[2 of 2 positions shown; findings below may reference images not displayed]

FINDINGS: Mediastinum and hilar structures are stable. Mild mediastinal
prominence is unchanged most likely secondary prominent great
vessels. Stable mild cardiomegaly. No pulmonary venous congestion.
No focal infiltrate. No pleural effusion or pneumothorax.
IMPRESSION: Stable mild cardiomegaly. No pulmonary venous congestion. No acute
pulmonary disease.

## 2017-09-07 ENCOUNTER — Encounter: Payer: Self-pay | Admitting: Internal Medicine

## 2017-09-07 ENCOUNTER — Ambulatory Visit (INDEPENDENT_AMBULATORY_CARE_PROVIDER_SITE_OTHER): Payer: Medicare Other | Admitting: Internal Medicine

## 2017-09-07 VITALS — BP 94/58 | HR 73 | Ht 68.0 in | Wt 118.6 lb

## 2017-09-07 DIAGNOSIS — E039 Hypothyroidism, unspecified: Secondary | ICD-10-CM

## 2017-09-07 LAB — TSH: TSH: 3.21 u[IU]/mL (ref 0.35–4.50)

## 2017-09-07 LAB — T4, FREE: FREE T4: 0.88 ng/dL (ref 0.60–1.60)

## 2017-09-07 MED ORDER — LEVOTHYROXINE SODIUM 50 MCG PO TABS: 50.0000 ug | ORAL_TABLET | Freq: Every day | ORAL | 3 refills | Status: AC

## 2017-09-07 NOTE — Progress Notes (Signed)
Patient ID: [name], male   DOB: 11/18/1945, 72 y.o.   MRN: [id number]    HPI  [name] is a 72 y.o.-year-old male, initially referred by his neurologist, Dr. Carles Collet, returning for follow-up for uncontrolled hypothyroidism.  Last visit 4 months ago.  He is here with his wife who offers most of the history, especially related to his past medical history and thyroid medication dosing.  Pt. has been dx with hypothyroidism in 2016 >> previously on levothyroxine 88 mcg daily, with suppressed TSH in 06/2017.  At that time, we decreased his levothyroxine dose to 50 mcg daily.  Subsequent TFTs were normal.  At last visit, he was not taking the levothyroxine correctly, but now he is taking it: - in am - fasting - at least 60 min from b'fast (was not separating it enough from breakfast at last visit) - no Ca, Fe, MVI - + PPIs - >4h later (was taking these before breakfast at last visit) - stopped Biotin 5000 mcg  I reviewed patient's TFTs-latest TSH was normal: Lab Results  Component Value Date   TSH 2.09 08/07/2017   TSH 0.04 (L) 06/20/2017   TSH 7.920 (H) 04/06/2017   TSH 0.02 (L) 03/02/2017   TSH 0.29 (L) 10/03/2016   TSH 0.24 (L) 09/19/2016   TSH 0.89 06/14/2016   TSH 0.47 03/13/2016   TSH 0.05 (L) 01/17/2016   TSH 0.23 (L) 12/02/2015   FREET4 0.88 08/07/2017   FREET4 1.21 06/20/2017   FREET4 0.95 04/06/2017   FREET4 0.94 09/19/2016   FREET4 1.53 01/17/2016   FREET4 1.18 12/02/2015   Pt. also has a history of Parkinson disease -followed by Dr. Carles Collet.  He continues on Sinemet.  Pt denies: - feeling nodules in neck - hoarseness - choking - SOB with lying down  He does have dysphagia, related to his Parkinson's disease.  He had swallowing training/therapy.  She has no FH of thyroid disorders. No FH of thyroid cancer. No h/o radiation tx to head or neck. No herbal supplements. No Biotin use. No recent steroids use.   ROS: Constitutional: no weight gain/no weight  loss, + fatigue, + subjective hyperthermia, + subjective hypothermia Eyes: + blurry vision, no xerophthalmia ENT: no sore throat, + see HPI Cardiovascular: no CP/no SOB/no palpitations/no leg swelling Respiratory: no cough/no SOB/no wheezing Gastrointestinal: no N/no V/no D/+ C/no acid reflux Musculoskeletal: no muscle aches/no joint aches Skin: no rashes, no hair loss Neurological: no tremors/no numbness/no tingling/no dizziness  I reviewed pt's medications, allergies, PMH, social hx, family hx, and changes were documented in the history of present illness. Otherwise, unchanged from my initial visit note.  Past Medical History:  Diagnosis Date  . Allergy   . Anemia   . Depression   . Parkinson's disease ([place])   . Parkinson's disease ([place]) 06/05/2012  . Raynaud's syndrome   . Thyroid disease    Past Surgical History:  Procedure Laterality Date  . APPENDECTOMY    . FRACTURE SURGERY     jaw  . HERNIA REPAIR    . MANDIBLE FRACTURE SURGERY    . ROTATOR CUFF REPAIR    . TONSILLECTOMY    . VASECTOMY    . WRIST FRACTURE SURGERY     Social History   Socioeconomic History  . Marital status: Married    Spouse name: [name]  . Number of children: 2  . Years of education: 12+  . Highest education level: Not on file  Occupational History  . Occupation:  DISABILITY    Employer: [employer]    Comment: due to Parkinson's  Social Needs  . Financial resource strain: Not on file  . Food insecurity:    Worry: Not on file    Inability: Not on file  . Transportation needs:    Medical: Not on file    Non-medical: Not on file  Tobacco Use  . Smoking status: Never Smoker  . Smokeless tobacco: Never Used  Substance and Sexual Activity  . Alcohol use: Yes    Alcohol/week: 0.0 - 4.2 oz    Comment: 1-2 beers/weeks  . Drug use: No  . Sexual activity: Never  Lifestyle  . Physical activity:    Days per week: Not on file    Minutes per session: Not on file  . Stress: Not on file   Relationships  . Social connections:    Talks on phone: Not on file    Gets together: Not on file    Attends religious service: Not on file    Active member of club or organization: Not on file    Attends meetings of clubs or organizations: Not on file    Relationship status: Not on file  . Intimate partner violence:    Fear of current or ex partner: Not on file    Emotionally abused: Not on file    Physically abused: Not on file    Forced sexual activity: Not on file  Other Topics Concern  . Not on file  Social History Narrative   Patient is married [name])  with 2 children.   Patient is right handed.   Patient has high school education.   Patient drinks 1-2 cups daily.   Current Outpatient Medications on File Prior to Visit  Medication Sig Dispense Refill  . aspirin 81 MG tablet Take 81 mg by mouth daily.     . Biotin 5000 MCG TABS Take 1 tablet by mouth daily.    . carbidopa-levodopa (SINEMET CR) 50-200 MG tablet Take 1 tablet by mouth at bedtime. 90 tablet 1  . carbidopa-levodopa (SINEMET IR) 25-100 MG tablet Take 1 tablet by mouth 4 (four) times daily. 360 tablet 1  . clonazePAM (KLONOPIN) 0.5 MG tablet Take 0.5 tablets (0.25 mg total) by mouth at bedtime. 45 tablet 1  . entacapone (COMTAN) 200 MG tablet Take 1 tablet (200 mg total) by mouth 4 (four) times daily. 120 tablet 5  . levothyroxine (SYNTHROID, LEVOTHROID) 50 MCG tablet Take 1 tablet (50 mcg total) by mouth daily. 30 tablet 2  . Multiple Vitamins-Minerals (MULTIVITAMIN WITH MINERALS) tablet Take 1 tablet by mouth daily.    Marland Kitchen omeprazole (PRILOSEC) 20 MG capsule Take 1 capsule (20 mg total) by mouth daily. 90 capsule 1  . rasagiline (AZILECT) 1 MG TABS tablet Take 1 tablet (1 mg total) by mouth daily. 90 tablet 0  . rOPINIRole (REQUIP) 1 MG tablet Take 1 tablet (1 mg total) by mouth 3 (three) times daily. 270 tablet 1  . trimethoprim-polymyxin b (POLYTRIM) ophthalmic solution Place 1 drop into the left eye every 4  (four) hours. 10 mL 0   No current facility-administered medications on file prior to visit.    Allergies  Allergen Reactions  . Lamictal [Lamotrigine] Other (See Comments)    Blisters in mouth  . Nsaids Other (See Comments)    Bleeding in stool   . Penicillins Rash    .Marland KitchenHas patient had a PCN reaction causing immediate rash, facial/tongue/throat swelling, SOB or lightheadedness with hypotension: No  Has patient had a PCN reaction causing severe rash involving mucus membranes or skin necrosis: No Has patient had a PCN reaction that required hospitalization No Has patient had a PCN reaction occurring within the last 10 years: No If all of the above answers are "NO", then may proceed with Cephalosporin use.    Family History  Problem Relation Age of Onset  . Colon cancer Father   . Heart disease Mother   . Huntington's disease Maternal Aunt   . Huntington's disease Maternal Uncle   . Multiple sclerosis Maternal Uncle    PE: BP (!) 94/58   Ht 5\' 8"  (1.727 m)   Wt 118 lb 9.6 oz (53.8 kg)   BMI 18.03 kg/m  Wt Readings from Last 3 Encounters:  09/07/17 118 lb 9.6 oz (53.8 kg)  07/06/17 120 lb (54.4 kg)  06/27/17 118 lb 3.2 oz (53.6 kg)   Constitutional: Thin, in NAD Eyes: PERRLA, EOMI, no exophthalmos ENT: moist mucous membranes, no thyromegaly, no cervical lymphadenopathy Cardiovascular: RRR, No MRG Respiratory: CTA B Gastrointestinal: abdomen soft, NT, ND, BS+ Musculoskeletal: no deformities, strength intact in all 4 Skin: moist, warm, no rashes, bluish discoloration of his fingertips (Raynaud's) Neurological: no tremor with outstretched hands, DTR normal in all 4  ASSESSMENT: 1. Hypothyroidism  PLAN:  1. Patient with long-standing hypothyroidism, uncontrolled, on levothyroxine therapy.  His TSH has fluctuated in the past, when I saw him last, a TSH was more than 7, but he was off levothyroxine at that time for 2 months.  He was restarted on levothyroxine afterwards, 88  mcg daily but a subsequent TSH was suppressed.  We backed off his levothyroxine dose to 50 mcg daily.  Last set of labs from 07/2017 were normal on this dose. - he continues on LT4 50 mcg daily - pt feels good on this dose, however, he still has symptoms that could be related to either hypothyroidism or Parkinson's: Fatigue, constipation, depression. - we discussed about taking the thyroid hormone every day, with water, >30 minutes before breakfast, separated by >4 hours from acid reflux medications, calcium, iron, multivitamins. Pt. is now taking it correctly.   - At last visit, we also discussed about the need to stop biotin At least a week prior to drawing thyroid labs.  He did stop biotin since last visit, but is planning to restart.  I also advised him to take Sinemet only after labs, but he forgot and already took it this morning. - will check thyroid tests today: TSH and fT4 - If labs are abnormal, he will need to return for repeat TFTs in 1.5 months  Orders Placed This Encounter  Procedures  . TSH  . T4, free   Needs refills.   - time spent with the patient: 15 minutes, of which >50% was spent in obtaining information about his symptoms, reviewing his previous labs, evaluations, and treatments, counseling him about his condition (please see the discussed topics above), and developing a plan to further investigate and treat it; he had a number of questions which I addressed.  Office Visit on 09/07/2017  Component Date Value Ref Range Status  . TSH 09/07/2017 3.21  0.35 - 4.50 uIU/mL Final  . Free T4 09/07/2017 0.88  0.60 - 1.60 ng/dL Final   Comment: Specimens from patients who are undergoing biotin therapy and /or ingesting biotin supplements may contain high levels of biotin.  The higher biotin concentration in these specimens interferes with this Free T4 assay.  Specimens that contain  high levels  of biotin may cause false high results for this Free T4 assay.  Please interpret results  in light of the total clinical presentation of the patient.     Thyroid tests are normal.  We will refill his current dose of levothyroxine.  Philemon Kingdom, MD PhD [facility] Endocrinology

## 2017-09-07 NOTE — Patient Instructions (Addendum)
Please continue Levothyroxine 50 mcg daily.  Take the thyroid hormone every day, with water, at least 30 minutes before breakfast, separated by at least 4 hours from: - acid reflux medications - calcium - iron - multivitamins  If you continue Biotin, please stop this at least 1 week before next thyroid labs.  Also, in the days that we check thyroid labs, please take Sinemet after labs.   Please come back for a follow-up appointment in 6 months.

## 2017-09-09 ENCOUNTER — Encounter: Payer: Self-pay | Admitting: Internal Medicine

## 2017-09-09 ENCOUNTER — Encounter: Payer: Self-pay | Admitting: Neurology

## 2017-09-09 ENCOUNTER — Other Ambulatory Visit: Payer: Self-pay | Admitting: Neurology

## 2017-09-10 ENCOUNTER — Telehealth: Payer: Self-pay

## 2017-09-10 NOTE — Telephone Encounter (Signed)
patient returning your call please call him back at [phone]

## 2017-09-10 NOTE — Telephone Encounter (Signed)
Spoke with pt about labs, and went over his results. He also asked for a refill on his medication which was sent to [facility] on [address] as requested.

## 2017-09-10 NOTE — Telephone Encounter (Signed)
Patient due for refill

## 2017-09-11 ENCOUNTER — Ambulatory Visit: Payer: Medicare Other | Admitting: Occupational Therapy

## 2017-09-11 ENCOUNTER — Ambulatory Visit: Payer: Medicare Other | Admitting: Physical Therapy

## 2017-09-11 ENCOUNTER — Ambulatory Visit: Payer: Medicare Other | Attending: Family Medicine

## 2017-09-11 DIAGNOSIS — R1312 Dysphagia, oropharyngeal phase: Secondary | ICD-10-CM

## 2017-09-11 DIAGNOSIS — R29818 Other symptoms and signs involving the nervous system: Secondary | ICD-10-CM | POA: Insufficient documentation

## 2017-09-11 DIAGNOSIS — R2689 Other abnormalities of gait and mobility: Secondary | ICD-10-CM | POA: Insufficient documentation

## 2017-09-11 DIAGNOSIS — R471 Dysarthria and anarthria: Secondary | ICD-10-CM | POA: Insufficient documentation

## 2017-09-11 NOTE — Therapy (Signed)
[facility] [address] [place], [place], [address] Phone: [phone]   Fax:  [phone]  Patient Details  Name: [name] MRN: [id number] Date of Birth: 1945-10-18 Referring Provider:  Wendie Agreste, MD  Encounter Date: 09/11/2017   Occupational Therapy Parkinson's Disease Screen  Hand dominance:  right   Fastening/unfastening 3 buttons in:  44.44sec  9-hole peg test:    RUE  26.53 sec        LUE  26.85 sec  Box & Blocks Test:   RUE  45 blocks        LUE  51 blocks  Change in ability to perform ADLs/IADLs:  no  Pt with some slowing with screening tasks.  Encouraged pt to incr OT HEP performance at home.  Educated pt of benefits of periodic therapy to prevent complications and indications for OT.  Pt/wife provided with therapy screening questionnaire.  Pt may benefit from occupational therapy, but desires to wait until next screening.  If pt continues to demo slowing with coordination, will recommend occupational therapy at that time.     [facility] 09/11/2017, 1:21 PM  [facility] [address] [place], [place], [address] Phone: [phone]   Fax:  [place], OTR/L [facility] [address] [place], [place]  [address] [phone] phone [phone] 09/11/17 1:21 PM

## 2017-09-11 NOTE — Therapy (Signed)
[facility] [address] [place], [place], [address] Phone: [phone]   Fax:  [phone]  Patient Details  Name: [name] MRN: [id number] Date of Birth: [birth year] Referring Provider:  Ludwig Clarks, DO  Encounter Date: 09/11/2017  Physical Therapy Parkinson's Disease Screen   Timed Up and Go test: 10.53 sec (10.03 sec)  10 meter walk test: 8.34 sec = 3.93 ft/sec (3.59 ft/sec)  5 time sit to stand test: 14.35 sec (12.5 sec)  Patient does not require Physical Therapy services at this time.  Recommend Physical Therapy screen in 6-9 months. Pt reports no falls, walks daily and works out on aerobic machines several times per week.    Zuley Lutter W. 09/11/2017, 1:37 PM Frazier Butt., PT  [facility] [address] [place], [place], [address] Phone: [phone]   Fax:  [phone]

## 2017-09-11 NOTE — Therapy (Signed)
[facility] [address] [place], [place], [address] Phone: [phone]   Fax:  [phone]  Patient Details  Name: [name] MRN: [id number] Date of Birth: Jul 20, 1945 Referring Provider:  Alonza Bogus, DO  Encounter Date: 09/11/2017  Speech Therapy Parkinson's Disease Screen   Decibel Level today: upper 60s dB  (WNL=70-72 dB) with sound level meter 30cm away from pt's mouth. Pt's conversational volume has remained the same since last treatment course. Pt admits noncompliance with respiratory muscle training. SLP reminded pt to complete these exercises as directed.  Pt does not report difficulty in swallowing warranting further evaluation, however he reports incr'd drooling since last screen. SLP recommended pt chew gum to facilitate swallowing and clearance of saliva from oral cavity. Pt reports intermittent completion of his swallowing HEP and SLP told pt to complete x2/week for maintenance of swallowing muscle strength. No overt s/s aspiration PNA were observed today. Pt and wife agree pt cont to follow swallow precautions from his latest swallow eval.   Pt does does not require speech therapy services at this time. Recommend ST screen in another approx 6 months   [facility] ,MS, CCC-SLP  09/11/2017, 3:23 PM  [facility] [address] [place], [place], [address] Phone: [phone]   Fax:  [phone]

## 2017-09-19 DIAGNOSIS — H35443 Age-related reticular degeneration of retina, bilateral: Secondary | ICD-10-CM | POA: Diagnosis not present

## 2017-09-25 ENCOUNTER — Other Ambulatory Visit: Payer: Self-pay

## 2017-09-25 ENCOUNTER — Ambulatory Visit (INDEPENDENT_AMBULATORY_CARE_PROVIDER_SITE_OTHER): Payer: Medicare Other | Admitting: Family Medicine

## 2017-09-25 ENCOUNTER — Encounter: Payer: Self-pay | Admitting: Family Medicine

## 2017-09-25 VITALS — BP 100/62 | HR 74 | Temp 97.6°F | Ht 69.0 in | Wt 107.4 lb

## 2017-09-25 DIAGNOSIS — M2042 Other hammer toe(s) (acquired), left foot: Secondary | ICD-10-CM | POA: Diagnosis not present

## 2017-09-25 DIAGNOSIS — G20 Parkinson's disease: Secondary | ICD-10-CM

## 2017-09-25 DIAGNOSIS — Z125 Encounter for screening for malignant neoplasm of prostate: Secondary | ICD-10-CM | POA: Diagnosis not present

## 2017-09-25 DIAGNOSIS — K5900 Constipation, unspecified: Secondary | ICD-10-CM | POA: Diagnosis not present

## 2017-09-25 DIAGNOSIS — Z0000 Encounter for general adult medical examination without abnormal findings: Secondary | ICD-10-CM | POA: Diagnosis not present

## 2017-09-25 DIAGNOSIS — E785 Hyperlipidemia, unspecified: Secondary | ICD-10-CM | POA: Diagnosis not present

## 2017-09-25 DIAGNOSIS — G20A1 Parkinson's disease without dyskinesia, without mention of fluctuations: Secondary | ICD-10-CM

## 2017-09-25 DIAGNOSIS — R5383 Other fatigue: Secondary | ICD-10-CM | POA: Diagnosis not present

## 2017-09-25 NOTE — Progress Notes (Signed)
Subjective:    Patient ID: [name], male    DOB: November 13, 1945, 72 y.o.   MRN: [id number]  HPI [name] is a 72 y.o. male Presents today for: Chief Complaint  Patient presents with  . Annual Exam    CPE  here for wellness exam/CPE.  History of multiple medical conditions including parkinsonism, depression, hypothyroidism, restless leg syndrome, lumbar back pain, elevated transaminases, autonomic postural hypotension, and chronic fatigue.  Established with me as primary care provider in February.  Multiple issues were discussed at that time.   Parkinson's disease with dysphagia: Neuro Dr. Carles Collet, office visit in May.  Speech therapy eval in January, dysphasia 3 diet recommended with silent aspiration, low-grade possible.  He takes Sinemet, Comtan (Added in May), Requip, and Klonopin as needed at bedtime. Azilect discontinued last visit prescribed by Dr. Carles Collet.  Has also been treated by neuro rehab.  Hypothyroidism:  Dr. Cruzita Lederer.  Synthroid 50 mcg daily.  Lab Results  Component Value Date   TSH 3.21 09/07/2017   Cancer screening: Colonoscopy: reports 3 years ago - Dr. Cristina Gong Prostate cancer screening: Lab Results  Component Value Date   PSA 2.2 11/10/2015   PSA 2.07 04/16/2014   PSA 1.73 06/18/2013  after discussion, would like to have testing done  Immunizations: Immunization History  Administered Date(s) Administered  . Hepatitis B, adult 03/13/2016, 04/13/2016, 10/10/2016  . Influenza Split 11/20/2011  . Influenza, High Dose Seasonal PF 03/02/2017  . Influenza,inj,Quad PF,6+ Mos 12/04/2012, 03/31/2014, 10/21/2014, 11/10/2015  . Pneumococcal Conjugate-13 11/26/2007, 05/18/2015  . Pneumococcal Polysaccharide-23 09/19/2016  . Pneumococcal-Unspecified 02/20/2009  . Tdap 06/20/2008, 12/04/2012  Shingles: had performed at his [facility].   Fall screening:  history of orthostatic hypotension and syncope in the past.  Compression  binder and hydration was discussed at neuro visit.  Denies recent falls.   Depression screening: Depression screen [facility] 2/9 09/25/2017 06/27/2017 04/06/2017 01/22/2017 09/19/2016  Decreased Interest 0 0 0 0 0  Down, Depressed, Hopeless 1 1 0 0 0  PHQ - 2 Score 1 1 0 0 0  Altered sleeping - - - - -  Tired, decreased energy - - - - -  Change in appetite - - - - -  Feeling bad or failure about yourself  - - - - -  Trouble concentrating - - - - -  Moving slowly or fidgety/restless - - - - -  Suicidal thoughts - - - - -  PHQ-9 Score - - - - -  Difficult doing work/chores - - - - -  feeling somewhat down. Took self off meds in past as on enough meds. Mother and father in law in bad health. No Travis.    Functional status: Functional Status Survey: Is the patient deaf or have difficulty hearing?: Yes - trouble hearing,screened at [facility].  Cost prohibitive.  Does the patient have difficulty seeing, even when wearing glasses/contacts?: Yes saw eye doctor last Wednesday, flap over left eye obstructing vision - referred to specialist.  Does the patient have difficulty concentrating, remembering, or making decisions?: Yes - with parkinsons. Wife helps.  Does the patient have difficulty walking or climbing stairs?: Yes - due to parkinsons Does the patient have difficulty dressing or bathing?: No Does the patient have difficulty doing errands alone such as visiting a doctor's office or shopping?: Yes - parkinsons, not driving.   Memory screen: 6CIT Screen 09/25/2017  What Year? 0 points  What month? 0 points  What  time? 0 points  Count back from 20 0 points  Months in reverse 0 points  Repeat phrase 0 points  Total Score 0   Vision screen: As above. Planning on repair of left upper lid.   Visual Acuity Screening   Right eye Left eye Both eyes  Without correction:     With correction: 20/40-1 20/30 20/30-1   Dental: every 6 month dental visits.  Activity/exercise: not much. Gets tired easy  with parkinsons. Neuro had recommended community exercise programs.   Advanced directives: Has healthcare power of attorney and living well, copies requested   Patient Active Problem List   Diagnosis Date Noted  . Lumbar back pain with radiculopathy affecting right lower extremity 09/19/2016  . Elevated transaminase level 12/20/2015  . Chronic fatigue 12/02/2015  . Raynaud's phenomenon 04/16/2014  . Insomnia 03/31/2014  . Syncope 11/11/2012  . Autonomic postural hypotension 11/11/2012  . Parkinson's disease ([place]) 06/05/2012  . Restless leg syndrome 11/15/2011  . Primary hypothyroidism 09/11/2011  . Parkinsonism ([place]) 04/02/2011  . Depression, major, recurrent, in partial remission ([place]) 04/02/2011   Past Medical History:  Diagnosis Date  . Allergy   . Anemia   . Depression   . Parkinson's disease ([place])   . Parkinson's disease ([place]) 06/05/2012  . Raynaud's syndrome   . Thyroid disease    Past Surgical History:  Procedure Laterality Date  . APPENDECTOMY    . FRACTURE SURGERY     jaw  . HERNIA REPAIR    . MANDIBLE FRACTURE SURGERY    . ROTATOR CUFF REPAIR    . TONSILLECTOMY    . VASECTOMY    . WRIST FRACTURE SURGERY     Allergies  Allergen Reactions  . Lamictal [Lamotrigine] Other (See Comments)    Blisters in mouth  . Nsaids Other (See Comments)    Bleeding in stool   . Penicillins Rash    .Marland KitchenHas patient had a PCN reaction causing immediate rash, facial/tongue/throat swelling, SOB or lightheadedness with hypotension: No Has patient had a PCN reaction causing severe rash involving mucus membranes or skin necrosis: No Has patient had a PCN reaction that required hospitalization No Has patient had a PCN reaction occurring within the last 10 years: No If all of the above answers are "NO", then may proceed with Cephalosporin use.    Prior to Admission medications   Medication Sig Start Date End Date Taking? Authorizing Provider  aspirin 81 MG tablet Take 81 mg by  mouth daily.    Yes [provider]  carbidopa-levodopa (SINEMET CR) 50-200 MG tablet Take 1 tablet by mouth at bedtime. 04/16/17  Yes Tat, Eustace Quail, DO  carbidopa-levodopa (SINEMET IR) 25-100 MG tablet Take 1 tablet by mouth 4 (four) times daily. 04/16/17  Yes Tat, Eustace Quail, DO  clonazePAM (KLONOPIN) 0.5 MG tablet TAKE 1/2 TABLET BY MOUTH AT BEDTIME 09/10/17  Yes Tat, Eustace Quail, DO  entacapone (COMTAN) 200 MG tablet Take 1 tablet (200 mg total) by mouth 4 (four) times daily. 07/06/17  Yes Tat, Eustace Quail, DO  levothyroxine (SYNTHROID, LEVOTHROID) 50 MCG tablet Take 1 tablet (50 mcg total) by mouth daily. 09/07/17  Yes Philemon Kingdom, MD  omeprazole (PRILOSEC) 20 MG capsule Take 1 capsule (20 mg total) by mouth daily. 05/01/17  Yes Wendie Agreste, MD  rOPINIRole (REQUIP) 1 MG tablet Take 1 tablet (1 mg total) by mouth 3 (three) times daily. 07/30/17  Yes Tat, Eustace Quail, DO  trimethoprim-polymyxin b (POLYTRIM) ophthalmic solution Place 1  drop into the left eye every 4 (four) hours. 06/27/17  Yes Tereasa Coop, PA-C   Social History   Socioeconomic History  . Marital status: Married    Spouse name: [name]  . Number of children: 2  . Years of education: 12+  . Highest education level: Not on file  Occupational History  . Occupation: DISABILITY    Employer: [employer]    Comment: due to [employer]  . Financial resource strain: Not on file  . Food insecurity:    Worry: Not on file    Inability: Not on file  . Transportation needs:    Medical: Not on file    Non-medical: Not on file  Tobacco Use  . Smoking status: Never Smoker  . Smokeless tobacco: Never Used  Substance and Sexual Activity  . Alcohol use: Yes    Alcohol/week: 0.0 - 4.2 oz    Comment: 1-2 beers/weeks  . Drug use: No  . Sexual activity: Never  Lifestyle  . Physical activity:    Days per week: Not on file    Minutes per session: Not on file  . Stress: Not on file  Relationships  . Social  connections:    Talks on phone: Not on file    Gets together: Not on file    Attends religious service: Not on file    Active member of club or organization: Not on file    Attends meetings of clubs or organizations: Not on file    Relationship status: Not on file  . Intimate partner violence:    Fear of current or ex partner: Not on file    Emotionally abused: Not on file    Physically abused: Not on file    Forced sexual activity: Not on file  Other Topics Concern  . Not on file  Social History Narrative   Patient is married [name])  with 2 children.   Patient is right handed.   Patient has high school education.   Patient drinks 1-2 cups daily.    Review of Systems Many positive responses, denies any acute issues, and states most are due to parkinsons.  Plans to follow up to discuss items further.      Objective:   Physical Exam  Constitutional: He is oriented to person, place, and time. He appears well-developed and well-nourished.  HENT:  Head: Normocephalic and atraumatic.  Right Ear: External ear normal.  Left Ear: External ear normal.  Mouth/Throat: Oropharynx is clear and moist.  Eyes: Pupils are equal, round, and reactive to light. Conjunctivae and EOM are normal.  Neck: Normal range of motion. Neck supple. No thyromegaly present.  Cardiovascular: Normal rate, regular rhythm, normal heart sounds and intact distal pulses.  Pulmonary/Chest: Effort normal and breath sounds normal. No respiratory distress. He has no wheezes.  Abdominal: Soft. He exhibits no distension. There is no tenderness. Hernia confirmed negative in the right inguinal area and confirmed negative in the left inguinal area.  Genitourinary:  Genitourinary Comments: Prostate firm, possibly enlarged but symmetrical.  Large amount of hard stool in rectum.  Musculoskeletal: Normal range of motion. He exhibits no edema or tenderness.       Feet:  Lymphadenopathy:    He has no cervical adenopathy.    Neurological: He is alert and oriented to person, place, and time. He has normal reflexes.  Skin: Skin is warm and dry.  Psychiatric: His affect is blunt. He is slowed. He expresses no suicidal ideation.  Vitals  reviewed.  Vitals:   09/25/17 0923  BP: 100/62  Pulse: 74  Temp: 97.6 F (36.4 C)  TempSrc: Oral  SpO2: 95%  Weight: 107 lb 6.4 oz (48.7 kg)  Height: _0  (1.753 m)      Assessment & Plan:   Benney Somm[place] is a 72 y.o. male Medicare annual wellness visit, subsequent  - anticipatory guidance as below in AVS, screening labs if needed. Health maintenance items as above in HPI discussed/recommended as applicable.   - no concerning responses on depression, fall, or functional status screening. Any positive responses noted as above. Advanced directives discussed as in [facility].   -There were other unrelated non-urgent complaints and issues noted on ROS form, another appointment was planned to review these additional non-urgent issues.   Hyperlipidemia, unspecified hyperlipidemia type - Plan: Lipid panel, Comprehensive metabolic panel  -check labs.  Slight elevation in 2018, check levels to determine ASCVD risk and recommendations.  Screening for prostate cancer - Plan: PSA  We discussed pros and cons of prostate cancer screening, and after this discussion, he chose to have screening done. PSA obtained. Suspected prostate enlargement. Depending on PSA may eval with urology.   Parkinson disease ([place]) Fatigue, unspecified type - Plan: CBC  -Followed by neuro as above.  Plans to return to discuss other issues including fatigue which may be related in part to Parkinson's.  CBC and cmp obtained.  Hammer toe of left foot - Plan: Ambulatory referral to Podiatry  -Symptomatic, but skin intact.  Refer to podiatry to evaluate for repair versus bracing  Constipation, unspecified constipation type  -Return stool noted on breast exam.  Constipation discussed with treatment options.  If  symptoms of fecal impaction or unable to have BM,  RTC precautions given  No orders of the defined types were placed in this encounter.  Patient Instructions   Make sure to drink plenty of fluids, fiber in the diet, and you may want to increase the frequency of MiraLAX as was a large amount of stool on your exam today.  See other information below on constipation.  If you are unable to have a bowel movement, return for repeat exam  I will refer you to podiatry for the second hammertoe on the left foot.  I will check some electrolytes and blood counts for fatigue, but please return to discuss this further.  Cholesterol mildly elevated previously, I will recheck that level today.  Please schedule another visit with me in the next week or 2 so we can review your other symptoms that were noted on the form at your physical.  If there are any acute changes in symptoms or worsening, return here or the emergency room   Constipation, Adult Constipation is when a person has fewer bowel movements in a week than normal, has difficulty having a bowel movement, or has stools that are dry, hard, or larger than normal. Constipation may be caused by an underlying condition. It may become worse with age if a person takes certain medicines and does not take in enough fluids. Follow these instructions at home: Eating and drinking   Eat foods that have a lot of fiber, such as fresh fruits and vegetables, whole grains, and beans.  Limit foods that are high in fat, low in fiber, or overly processed, such as french fries, hamburgers, cookies, candies, and soda.  Drink enough fluid to keep your urine clear or pale yellow. General instructions  Exercise regularly or as told by your health care  provider.  Go to the restroom when you have the urge to go. Do not hold it in.  Take over-the-counter and prescription medicines only as told by your health care provider. These include any fiber  supplements.  Practice pelvic floor retraining exercises, such as deep breathing while relaxing the lower abdomen and pelvic floor relaxation during bowel movements.  Watch your condition for any changes.  Keep all follow-up visits as told by your health care provider. This is important. Contact a health care provider if:  You have pain that gets worse.  You have a fever.  You do not have a bowel movement after 4 days.  You vomit.  You are not hungry.  You lose weight.  You are bleeding from the anus.  You have thin, pencil-like stools. Get help right away if:  You have a fever and your symptoms suddenly get worse.  You leak stool or have blood in your stool.  Your abdomen is bloated.  You have severe pain in your abdomen.  You feel dizzy or you faint. This information is not intended to replace advice given to you by your health care provider. Make sure you discuss any questions you have with your health care provider. Document Released: 11/05/2003 Document Revised: 08/27/2015 Document Reviewed: 07/28/2015 Elsevier Interactive Patient Education  2018 [employer] 65 Years and Older, Male Preventive care refers to lifestyle choices and visits with your health care provider that can promote health and wellness. What does preventive care include?  A yearly physical exam. This is also called an annual well check.  Dental exams once or twice a year.  Routine eye exams. Ask your health care provider how often you should have your eyes checked.  Personal lifestyle choices, including: ? Daily care of your teeth and gums. ? Regular physical activity. ? Eating a healthy diet. ? Avoiding tobacco and drug use. ? Limiting alcohol use. ? Practicing safe sex. ? Taking low doses of aspirin every day. ? Taking vitamin and mineral supplements as recommended by your health care provider. What happens during an annual well check? The services and  screenings done by your health care provider during your annual well check will depend on your age, overall health, lifestyle risk factors, and family history of disease. Counseling Your health care provider may ask you questions about your:  Alcohol use.  Tobacco use.  Drug use.  Emotional well-being.  Home and relationship well-being.  Sexual activity.  Eating habits.  History of falls.  Memory and ability to understand (cognition).  Work and work Statistician.  Screening You may have the following tests or measurements:  Height, weight, and BMI.  Blood pressure.  Lipid and cholesterol levels. These may be checked every 5 years, or more frequently if you are over 44 years old.  Skin check.  Lung cancer screening. You may have this screening every year starting at age 2 if you have a 30-pack-year history of smoking and currently smoke or have quit within the past 15 years.  Fecal occult blood test (FOBT) of the stool. You may have this test every year starting at age 73.  Flexible sigmoidoscopy or colonoscopy. You may have a sigmoidoscopy every 5 years or a colonoscopy every 10 years starting at age 106.  Prostate cancer screening. Recommendations will vary depending on your family history and other risks.  Hepatitis C blood test.  Hepatitis B blood test.  Sexually transmitted disease (STD) testing.  Diabetes screening. This is  done by checking your blood sugar (glucose) after you have not eaten for a while (fasting). You may have this done every 1-3 years.  Abdominal aortic aneurysm (AAA) screening. You may need this if you are a current or former smoker.  Osteoporosis. You may be screened starting at age 58 if you are at high risk.  Talk with your health care provider about your test results, treatment options, and if necessary, the need for more tests. Vaccines Your health care provider may recommend certain vaccines, such as:  Influenza vaccine. This is  recommended every year.  Tetanus, diphtheria, and acellular pertussis (Tdap, Td) vaccine. You may need a Td booster every 10 years.  Varicella vaccine. You may need this if you have not been vaccinated.  Zoster vaccine. You may need this after age 79.  Measles, mumps, and rubella (MMR) vaccine. You may need at least one dose of MMR if you were born in 1957 or later. You may also need a second dose.  Pneumococcal 13-valent conjugate (PCV13) vaccine. One dose is recommended after age 34.  Pneumococcal polysaccharide (PPSV23) vaccine. One dose is recommended after age 40.  Meningococcal vaccine. You may need this if you have certain conditions.  Hepatitis A vaccine. You may need this if you have certain conditions or if you travel or work in places where you may be exposed to hepatitis A.  Hepatitis B vaccine. You may need this if you have certain conditions or if you travel or work in places where you may be exposed to hepatitis B.  Haemophilus influenzae type b (Hib) vaccine. You may need this if you have certain risk factors.  Talk to your health care provider about which screenings and vaccines you need and how often you need them. This information is not intended to replace advice given to you by your health care provider. Make sure you discuss any questions you have with your health care provider. Document Released: 03/05/2015 Document Revised: 10/27/2015 Document Reviewed: 12/08/2014 Elsevier Interactive Patient Education  2018 [employer].   IF you received an x-ray today, you will receive an invoice from [facility]. Please contact [facility] at [phone] with questions or concerns regarding your invoice.   IF you received labwork today, you will receive an invoice from [facility]. Please contact [facility] at [phone] with questions or concerns regarding your invoice.   Our billing staff will not be able to assist you with questions regarding bills from  these companies.  You will be contacted with the lab results as soon as they are available. The fastest way to get your results is to activate your My Chart account. Instructions are located on the last page of this paperwork. If you have not heard from [place] regarding the results in 2 weeks, please contact this office.       Signed,   [name], MD [facility].  09/30/17 8:53 AM

## 2017-09-25 NOTE — Patient Instructions (Addendum)
Make sure to drink plenty of fluids, fiber in the diet, and you may want to increase the frequency of MiraLAX as was a large amount of stool on your exam today.  See other information below on constipation.  If you are unable to have a bowel movement, return for repeat exam  I will refer you to podiatry for the second hammertoe on the left foot.  I will check some electrolytes and blood counts for fatigue, but please return to discuss this further.  Cholesterol mildly elevated previously, I will recheck that level today.  Please schedule another visit with me in the next week or 2 so we can review your other symptoms that were noted on the form at your physical.  If there are any acute changes in symptoms or worsening, return here or the emergency room   Constipation, Adult Constipation is when a person has fewer bowel movements in a week than normal, has difficulty having a bowel movement, or has stools that are dry, hard, or larger than normal. Constipation may be caused by an underlying condition. It may become worse with age if a person takes certain medicines and does not take in enough fluids. Follow these instructions at home: Eating and drinking   Eat foods that have a lot of fiber, such as fresh fruits and vegetables, whole grains, and beans.  Limit foods that are high in fat, low in fiber, or overly processed, such as french fries, hamburgers, cookies, candies, and soda.  Drink enough fluid to keep your urine clear or pale yellow. General instructions  Exercise regularly or as told by your health care provider.  Go to the restroom when you have the urge to go. Do not hold it in.  Take over-the-counter and prescription medicines only as told by your health care provider. These include any fiber supplements.  Practice pelvic floor retraining exercises, such as deep breathing while relaxing the lower abdomen and pelvic floor relaxation during bowel movements.  Watch your  condition for any changes.  Keep all follow-up visits as told by your health care provider. This is important. Contact a health care provider if:  You have pain that gets worse.  You have a fever.  You do not have a bowel movement after 4 days.  You vomit.  You are not hungry.  You lose weight.  You are bleeding from the anus.  You have thin, pencil-like stools. Get help right away if:  You have a fever and your symptoms suddenly get worse.  You leak stool or have blood in your stool.  Your abdomen is bloated.  You have severe pain in your abdomen.  You feel dizzy or you faint. This information is not intended to replace advice given to you by your health care provider. Make sure you discuss any questions you have with your health care provider. Document Released: 11/05/2003 Document Revised: 08/27/2015 Document Reviewed: 07/28/2015 Elsevier Interactive Patient Education  2018 [place] 65 Years and Older, Male Preventive care refers to lifestyle choices and visits with your health care provider that can promote health and wellness. What does preventive care include?  A yearly physical exam. This is also called an annual well check.  Dental exams once or twice a year.  Routine eye exams. Ask your health care provider how often you should have your eyes checked.  Personal lifestyle choices, including: ? Daily care of your teeth and gums. ? Regular physical activity. ? Eating a healthy  diet. ? Avoiding tobacco and drug use. ? Limiting alcohol use. ? Practicing safe sex. ? Taking low doses of aspirin every day. ? Taking vitamin and mineral supplements as recommended by your health care provider. What happens during an annual well check? The services and screenings done by your health care provider during your annual well check will depend on your age, overall health, lifestyle risk factors, and family history of disease. Counseling Your  health care provider may ask you questions about your:  Alcohol use.  Tobacco use.  Drug use.  Emotional well-being.  Home and relationship well-being.  Sexual activity.  Eating habits.  History of falls.  Memory and ability to understand (cognition).  Work and work Statistician.  Screening You may have the following tests or measurements:  Height, weight, and BMI.  Blood pressure.  Lipid and cholesterol levels. These may be checked every 5 years, or more frequently if you are over 53 years old.  Skin check.  Lung cancer screening. You may have this screening every year starting at age 50 if you have a 30-pack-year history of smoking and currently smoke or have quit within the past 15 years.  Fecal occult blood test (FOBT) of the stool. You may have this test every year starting at age 32.  Flexible sigmoidoscopy or colonoscopy. You may have a sigmoidoscopy every 5 years or a colonoscopy every 10 years starting at age 29.  Prostate cancer screening. Recommendations will vary depending on your family history and other risks.  Hepatitis C blood test.  Hepatitis B blood test.  Sexually transmitted disease (STD) testing.  Diabetes screening. This is done by checking your blood sugar (glucose) after you have not eaten for a while (fasting). You may have this done every 1-3 years.  Abdominal aortic aneurysm (AAA) screening. You may need this if you are a current or former smoker.  Osteoporosis. You may be screened starting at age 51 if you are at high risk.  Talk with your health care provider about your test results, treatment options, and if necessary, the need for more tests. Vaccines Your health care provider may recommend certain vaccines, such as:  Influenza vaccine. This is recommended every year.  Tetanus, diphtheria, and acellular pertussis (Tdap, Td) vaccine. You may need a Td booster every 10 years.  Varicella vaccine. You may need this if you have not  been vaccinated.  Zoster vaccine. You may need this after age 92.  Measles, mumps, and rubella (MMR) vaccine. You may need at least one dose of MMR if you were born in 1957 or later. You may also need a second dose.  Pneumococcal 13-valent conjugate (PCV13) vaccine. One dose is recommended after age 61.  Pneumococcal polysaccharide (PPSV23) vaccine. One dose is recommended after age 68.  Meningococcal vaccine. You may need this if you have certain conditions.  Hepatitis A vaccine. You may need this if you have certain conditions or if you travel or work in places where you may be exposed to hepatitis A.  Hepatitis B vaccine. You may need this if you have certain conditions or if you travel or work in places where you may be exposed to hepatitis B.  Haemophilus influenzae type b (Hib) vaccine. You may need this if you have certain risk factors.  Talk to your health care provider about which screenings and vaccines you need and how often you need them. This information is not intended to replace advice given to you by your health care provider. Make  sure you discuss any questions you have with your health care provider. Document Released: 03/05/2015 Document Revised: 10/27/2015 Document Reviewed: 12/08/2014 Elsevier Interactive Patient Education  2018 [employer].   IF you received an x-ray today, you will receive an invoice from [facility]. Please contact [facility] at [phone] with questions or concerns regarding your invoice.   IF you received labwork today, you will receive an invoice from [facility]. Please contact [facility] at [phone] with questions or concerns regarding your invoice.   Our billing staff will not be able to assist you with questions regarding bills from these companies.  You will be contacted with the lab results as soon as they are available. The fastest way to get your results is to activate your My Chart account. Instructions are  located on the last page of this paperwork. If you have not heard from [place] regarding the results in 2 weeks, please contact this office.

## 2017-09-26 LAB — COMPREHENSIVE METABOLIC PANEL
A/G RATIO: 1.3 (ref 1.2–2.2)
ALT: 9 IU/L (ref 0–44)
AST: 26 IU/L (ref 0–40)
Albumin: 4.2 g/dL (ref 3.6–4.8)
Alkaline Phosphatase: 113 IU/L (ref 39–117)
BUN/Creatinine Ratio: 13 (ref 10–24)
BUN: 13 mg/dL (ref 8–27)
Bilirubin Total: 0.3 mg/dL (ref 0.0–1.2)
CHLORIDE: 103 mmol/L (ref 96–106)
CO2: 23 mmol/L (ref 20–29)
Calcium: 9.9 mg/dL (ref 8.6–10.2)
Creatinine, Ser: 1 mg/dL (ref 0.76–1.27)
GFR calc Af Amer: 88 mL/min/{1.73_m2} (ref >59)
GFR calc non Af Amer: 76 mL/min/{1.73_m2} (ref >59)
GLOBULIN, TOTAL: 3.2 g/dL (ref 1.5–4.5)
Glucose: 82 mg/dL (ref 65–99)
POTASSIUM: 4.9 mmol/L (ref 3.5–5.2)
SODIUM: 142 mmol/L (ref 134–144)
Total Protein: 7.4 g/dL (ref 6.0–8.5)

## 2017-09-26 LAB — CBC
HEMOGLOBIN: 12.7 g/dL — AB (ref 13.0–17.7)
Hematocrit: 39.7 % (ref 37.5–51.0)
MCH: 28.2 pg (ref 26.6–33.0)
MCHC: 32 g/dL (ref 31.5–35.7)
MCV: 88 fL (ref 79–97)
PLATELETS: 304 10*3/uL (ref 150–450)
RBC: 4.51 x10E6/uL (ref 4.14–5.80)
RDW: 15 % (ref 12.3–15.4)
WBC: 7.3 10*3/uL (ref 3.4–10.8)

## 2017-09-26 LAB — LIPID PANEL
CHOL/HDL RATIO: 2.9 ratio (ref 0.0–5.0)
CHOLESTEROL TOTAL: 195 mg/dL (ref 100–199)
HDL: 68 mg/dL (ref >39)
LDL CALC: 112 mg/dL — AB (ref 0–99)
TRIGLYCERIDES: 76 mg/dL (ref 0–149)
VLDL Cholesterol Cal: 15 mg/dL (ref 5–40)

## 2017-09-26 LAB — PSA: PROSTATE SPECIFIC AG, SERUM: 7 ng/mL — AB (ref 0.0–4.0)

## 2017-09-30 ENCOUNTER — Encounter: Payer: Self-pay | Admitting: Family Medicine

## 2017-10-03 ENCOUNTER — Telehealth: Payer: Self-pay | Admitting: Family Medicine

## 2017-10-03 ENCOUNTER — Telehealth: Payer: Self-pay | Admitting: Neurology

## 2017-10-03 DIAGNOSIS — I469 Cardiac arrest, cause unspecified: Secondary | ICD-10-CM | POA: Diagnosis not present

## 2017-10-03 DIAGNOSIS — R404 Transient alteration of awareness: Secondary | ICD-10-CM | POA: Diagnosis not present

## 2017-10-03 DIAGNOSIS — I499 Cardiac arrhythmia, unspecified: Secondary | ICD-10-CM | POA: Diagnosis not present

## 2017-10-03 DIAGNOSIS — R4020 Unspecified coma: Secondary | ICD-10-CM | POA: Diagnosis not present

## 2017-10-04 ENCOUNTER — Telehealth: Payer: Self-pay | Admitting: Family Medicine

## 2017-10-04 NOTE — Telephone Encounter (Signed)
Called home number. Went to [employer] again.  Left message for [name] to share condolences on Blu's passing, and to talk to her further as needed.  Left main office number for her to call back.

## 2017-10-08 ENCOUNTER — Ambulatory Visit: Payer: Medicare Other | Admitting: Family Medicine

## 2017-10-12 ENCOUNTER — Other Ambulatory Visit: Payer: Self-pay | Admitting: Neurology

## 2017-10-21 NOTE — Telephone Encounter (Signed)
Notification received regarding patient's passing today.  Call placed to home number to try to reach [facility].  Went to [employer].  I left a message that I would try to reach her again tomorrow.  However I did call from unlisted number as I am calling from home.  Advised on voicemail that if she would like to talk tonight, to please call our main number and the answering service can reach me tonight.  Otherwise will try again tomorrow.
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
## 2017-10-21 DEATH — deceased

## 2017-10-31 ENCOUNTER — Telehealth: Payer: Self-pay | Admitting: Family Medicine

## 2017-10-31 NOTE — Telephone Encounter (Signed)
Called provided number. No answer. Left voicemail that I would try to reach again tomorrow.

## 2017-10-31 NOTE — Telephone Encounter (Signed)
Copied from [place] [phone] [phone]. Topic: General - Other >> Oct 31, 2017  3:20 PM Adelene Idler wrote: Pts wife called in and asking a call back immediately [phone] regarding a personal matter.

## 2017-10-31 NOTE — Telephone Encounter (Signed)
Please advise 

## 2017-10-31 NOTE — Telephone Encounter (Signed)
[name] called in back in and is now ready to speak with Dr Carlota Raspberry.  Per office he was in with a Patient , please sent message and they would forward to Dr Carlota Raspberry.  [name] stated he could contact her at this below number .     [phone]

## 2017-10-31 NOTE — Telephone Encounter (Signed)
Please see message below and advise.

## 2017-11-01 NOTE — Telephone Encounter (Signed)
Pt's wife called back to speak with Dr. Carlota Raspberry.  Wife disconnected call while waiting on physician to come to phone.

## 2017-11-01 NOTE — Telephone Encounter (Deleted)
Called [place].   She stated she is not blaming me for his passing, but just could not quite understand.  She asked  about some of the other concerns listed on his paperwork for his wellness exam.  Discussed that we had reviewed some of his symptoms and there was a plan for follow-up visit to discuss this further as  Plans on therapy, [facility]  [facility] [place].   Has xanax 0.5mg  prescribed by Dr. Melford Aase, sees Vicie Mutters.

## 2017-11-01 NOTE — Telephone Encounter (Signed)
Returned [employer], expressed condolences on her husbands's passing. She discussed that she does not blame me for his passing. Listened to concerns about some of his ongoing health concerns.  She is seeking care with counseling and is under care of her primary provider. I offered that if other resources needed during this difficult time, to please let us know.

## 2017-11-16 ENCOUNTER — Ambulatory Visit: Payer: 59 | Admitting: Neurology

## 2018-03-15 ENCOUNTER — Ambulatory Visit: Payer: 59 | Admitting: Internal Medicine

## 2018-05-02 ENCOUNTER — Ambulatory Visit: Payer: 59

## 2018-05-02 ENCOUNTER — Ambulatory Visit: Payer: 59 | Admitting: Physical Therapy

## 2018-05-02 ENCOUNTER — Ambulatory Visit: Payer: 59 | Admitting: Occupational Therapy
